# Patient Record
Sex: Female | Born: 1955
Health system: Southern US, Community
[De-identification: ages and names within clinical notes are randomized; demographics above are authoritative.]

## PROBLEM LIST (undated history)

## (undated) DIAGNOSIS — H548 Legal blindness, as defined in USA: Secondary | ICD-10-CM

## (undated) DIAGNOSIS — M170 Bilateral primary osteoarthritis of knee: Secondary | ICD-10-CM

## (undated) DIAGNOSIS — G2581 Restless legs syndrome: Secondary | ICD-10-CM

## (undated) DIAGNOSIS — F419 Anxiety disorder, unspecified: Secondary | ICD-10-CM

## (undated) DIAGNOSIS — F410 Panic disorder [episodic paroxysmal anxiety] without agoraphobia: Secondary | ICD-10-CM

## (undated) DIAGNOSIS — F40240 Claustrophobia: Secondary | ICD-10-CM

## (undated) DIAGNOSIS — Z915 Personal history of self-harm: Secondary | ICD-10-CM

## (undated) DIAGNOSIS — R748 Abnormal levels of other serum enzymes: Secondary | ICD-10-CM

## (undated) DIAGNOSIS — D649 Anemia, unspecified: Secondary | ICD-10-CM

## (undated) DIAGNOSIS — J3089 Other allergic rhinitis: Secondary | ICD-10-CM

## (undated) DIAGNOSIS — F1190 Opioid use, unspecified, uncomplicated: Secondary | ICD-10-CM

## (undated) DIAGNOSIS — Z9049 Acquired absence of other specified parts of digestive tract: Secondary | ICD-10-CM

## (undated) DIAGNOSIS — F329 Major depressive disorder, single episode, unspecified: Secondary | ICD-10-CM

## (undated) DIAGNOSIS — K449 Diaphragmatic hernia without obstruction or gangrene: Secondary | ICD-10-CM

## (undated) DIAGNOSIS — R32 Unspecified urinary incontinence: Secondary | ICD-10-CM

## (undated) DIAGNOSIS — R002 Palpitations: Secondary | ICD-10-CM

## (undated) DIAGNOSIS — N816 Rectocele: Secondary | ICD-10-CM

## (undated) DIAGNOSIS — B1910 Unspecified viral hepatitis B without hepatic coma: Secondary | ICD-10-CM

## (undated) DIAGNOSIS — E7800 Pure hypercholesterolemia, unspecified: Secondary | ICD-10-CM

## (undated) DIAGNOSIS — G609 Hereditary and idiopathic neuropathy, unspecified: Secondary | ICD-10-CM

## (undated) DIAGNOSIS — K602 Anal fissure, unspecified: Secondary | ICD-10-CM

## (undated) DIAGNOSIS — M810 Age-related osteoporosis without current pathological fracture: Secondary | ICD-10-CM

## (undated) DIAGNOSIS — F32A Depression, unspecified: Secondary | ICD-10-CM

## (undated) DIAGNOSIS — Z8711 Personal history of peptic ulcer disease: Secondary | ICD-10-CM

## (undated) DIAGNOSIS — R110 Nausea: Secondary | ICD-10-CM

## (undated) DIAGNOSIS — R441 Visual hallucinations: Secondary | ICD-10-CM

## (undated) DIAGNOSIS — K279 Peptic ulcer, site unspecified, unspecified as acute or chronic, without hemorrhage or perforation: Secondary | ICD-10-CM

## (undated) DIAGNOSIS — Z90710 Acquired absence of both cervix and uterus: Secondary | ICD-10-CM

## (undated) DIAGNOSIS — H2510 Age-related nuclear cataract, unspecified eye: Secondary | ICD-10-CM

## (undated) DIAGNOSIS — H5316 Psychophysical visual disturbances: Secondary | ICD-10-CM

## (undated) DIAGNOSIS — H3552 Pigmentary retinal dystrophy: Secondary | ICD-10-CM

## (undated) DIAGNOSIS — N941 Unspecified dyspareunia: Secondary | ICD-10-CM

## (undated) DIAGNOSIS — M7918 Myalgia, other site: Secondary | ICD-10-CM

## (undated) DIAGNOSIS — M1990 Unspecified osteoarthritis, unspecified site: Secondary | ICD-10-CM

## (undated) DIAGNOSIS — I10 Essential (primary) hypertension: Secondary | ICD-10-CM

## (undated) DIAGNOSIS — Z9889 Other specified postprocedural states: Secondary | ICD-10-CM

## (undated) DIAGNOSIS — K219 Gastro-esophageal reflux disease without esophagitis: Secondary | ICD-10-CM

## (undated) HISTORY — DX: Personal history of self-harm: Z91.5

## (undated) HISTORY — PX: COLONOSCOPY: SHX174

## (undated) HISTORY — DX: Depression, unspecified: F32.A

## (undated) HISTORY — DX: Legal blindness, as defined in USA: H54.8

## (undated) HISTORY — DX: Unspecified osteoarthritis, unspecified site: M19.90

## (undated) HISTORY — DX: Other specified postprocedural states: Z98.890

## (undated) HISTORY — DX: Peptic ulcer, site unspecified, unspecified as acute or chronic, without hemorrhage or perforation: K27.9

## (undated) HISTORY — DX: Major depressive disorder, single episode, unspecified: F32.9

## (undated) HISTORY — PX: CHOLECYSTECTOMY: SHX55

## (undated) HISTORY — PX: RECTOCELE REPAIR: SHX761

## (undated) HISTORY — DX: Diaphragmatic hernia without obstruction or gangrene: K44.9

## (undated) HISTORY — PX: HERNIA REPAIR: SHX51

## (undated) HISTORY — DX: Rectocele: N81.6

## (undated) HISTORY — DX: Essential (primary) hypertension: I10

## (undated) HISTORY — DX: Pure hypercholesterolemia, unspecified: E78.00

## (undated) HISTORY — DX: Acquired absence of both cervix and uterus: Z90.710

## (undated) HISTORY — PX: APPENDECTOMY: SHX54

## (undated) HISTORY — PX: ESOPHAGOGASTRODUODENOSCOPY: SHX1529

## (undated) HISTORY — DX: Personal history of peptic ulcer disease: Z87.11

## (undated) HISTORY — PX: OOPHORECTOMY: SHX86

## (undated) HISTORY — DX: Gastro-esophageal reflux disease without esophagitis: K21.9

## (undated) HISTORY — DX: Unspecified viral hepatitis B without hepatic coma: B19.10

## (undated) HISTORY — DX: Panic disorder (episodic paroxysmal anxiety): F41.0

## (undated) HISTORY — DX: Pigmentary retinal dystrophy: H35.52

## (undated) HISTORY — PX: ABDOMINAL HYSTERECTOMY: SHX81

## (undated) HISTORY — DX: Acquired absence of other specified parts of digestive tract: Z90.49

## (undated) HISTORY — DX: Anxiety disorder, unspecified: F41.9

---

## 1975-02-19 HISTORY — PX: BREAST BIOPSY: SHX20

## 2003-12-02 ENCOUNTER — Ambulatory Visit: Payer: Self-pay | Admitting: Gastroenterology

## 2005-06-04 ENCOUNTER — Ambulatory Visit: Payer: Self-pay | Admitting: Gastroenterology

## 2005-11-13 ENCOUNTER — Other Ambulatory Visit: Payer: Self-pay

## 2005-12-02 ENCOUNTER — Inpatient Hospital Stay: Payer: Self-pay | Admitting: Obstetrics and Gynecology

## 2006-03-18 ENCOUNTER — Other Ambulatory Visit: Payer: Self-pay

## 2006-03-18 ENCOUNTER — Ambulatory Visit: Payer: Self-pay | Admitting: Obstetrics and Gynecology

## 2006-03-24 ENCOUNTER — Observation Stay: Payer: Self-pay | Admitting: Obstetrics and Gynecology

## 2006-07-09 ENCOUNTER — Ambulatory Visit: Payer: Self-pay | Admitting: Obstetrics and Gynecology

## 2006-12-24 ENCOUNTER — Ambulatory Visit: Payer: Self-pay | Admitting: Pain Medicine

## 2007-01-13 ENCOUNTER — Ambulatory Visit: Payer: Self-pay | Admitting: Pain Medicine

## 2007-01-19 ENCOUNTER — Ambulatory Visit: Payer: Self-pay | Admitting: Pain Medicine

## 2007-01-27 ENCOUNTER — Ambulatory Visit: Payer: Self-pay | Admitting: Pain Medicine

## 2007-02-16 ENCOUNTER — Ambulatory Visit: Payer: Self-pay | Admitting: Physician Assistant

## 2007-03-23 ENCOUNTER — Ambulatory Visit: Payer: Self-pay | Admitting: Physician Assistant

## 2007-04-21 ENCOUNTER — Ambulatory Visit: Payer: Self-pay | Admitting: Physician Assistant

## 2007-06-30 ENCOUNTER — Ambulatory Visit: Payer: Self-pay | Admitting: Physician Assistant

## 2007-07-07 ENCOUNTER — Ambulatory Visit: Payer: Self-pay | Admitting: Pain Medicine

## 2007-07-13 ENCOUNTER — Ambulatory Visit: Payer: Self-pay | Admitting: Obstetrics and Gynecology

## 2007-07-22 ENCOUNTER — Ambulatory Visit: Payer: Self-pay | Admitting: Physician Assistant

## 2008-01-05 ENCOUNTER — Ambulatory Visit: Payer: Self-pay | Admitting: Physician Assistant

## 2008-03-01 ENCOUNTER — Ambulatory Visit: Payer: Self-pay | Admitting: Physician Assistant

## 2008-07-26 ENCOUNTER — Ambulatory Visit: Payer: Self-pay | Admitting: Obstetrics and Gynecology

## 2008-08-25 ENCOUNTER — Ambulatory Visit: Payer: Self-pay | Admitting: Physician Assistant

## 2008-10-05 ENCOUNTER — Ambulatory Visit: Payer: Self-pay | Admitting: Physician Assistant

## 2008-12-01 ENCOUNTER — Ambulatory Visit: Payer: Self-pay | Admitting: Physician Assistant

## 2008-12-13 ENCOUNTER — Ambulatory Visit: Payer: Self-pay | Admitting: Pain Medicine

## 2009-01-05 ENCOUNTER — Ambulatory Visit: Payer: Self-pay | Admitting: Physician Assistant

## 2009-02-06 ENCOUNTER — Ambulatory Visit: Payer: Self-pay | Admitting: Pain Medicine

## 2009-02-27 ENCOUNTER — Ambulatory Visit: Payer: Self-pay | Admitting: Pain Medicine

## 2009-03-21 ENCOUNTER — Ambulatory Visit: Payer: Self-pay | Admitting: Pain Medicine

## 2009-04-12 ENCOUNTER — Ambulatory Visit: Payer: Self-pay | Admitting: Pain Medicine

## 2009-05-02 ENCOUNTER — Ambulatory Visit: Payer: Self-pay | Admitting: Pain Medicine

## 2009-07-27 ENCOUNTER — Ambulatory Visit: Payer: Self-pay | Admitting: Obstetrics and Gynecology

## 2010-01-03 ENCOUNTER — Ambulatory Visit: Payer: Self-pay | Admitting: Pain Medicine

## 2010-02-22 ENCOUNTER — Ambulatory Visit: Payer: Self-pay | Admitting: Pain Medicine

## 2010-03-13 ENCOUNTER — Ambulatory Visit: Payer: Self-pay | Admitting: Pain Medicine

## 2010-03-26 ENCOUNTER — Ambulatory Visit: Payer: Self-pay | Admitting: Pain Medicine

## 2010-05-29 ENCOUNTER — Ambulatory Visit: Payer: Self-pay | Admitting: Internal Medicine

## 2010-06-25 ENCOUNTER — Ambulatory Visit: Payer: Self-pay | Admitting: Pain Medicine

## 2010-07-24 ENCOUNTER — Emergency Department: Payer: Self-pay | Admitting: Emergency Medicine

## 2010-07-30 ENCOUNTER — Ambulatory Visit: Payer: Self-pay | Admitting: Obstetrics and Gynecology

## 2011-02-19 DIAGNOSIS — Z9151 Personal history of suicidal behavior: Secondary | ICD-10-CM

## 2011-02-19 HISTORY — DX: Personal history of suicidal behavior: Z91.51

## 2013-07-30 DIAGNOSIS — M1990 Unspecified osteoarthritis, unspecified site: Secondary | ICD-10-CM

## 2013-07-30 HISTORY — DX: Unspecified osteoarthritis, unspecified site: M19.90

## 2013-09-08 ENCOUNTER — Emergency Department: Payer: Self-pay | Admitting: Emergency Medicine

## 2013-09-08 ENCOUNTER — Ambulatory Visit: Payer: Self-pay | Admitting: Gastroenterology

## 2013-09-08 LAB — COMPREHENSIVE METABOLIC PANEL
ALK PHOS: 209 U/L — AB
AST: 94 U/L — AB (ref 15–37)
Albumin: 3.3 g/dL — ABNORMAL LOW (ref 3.4–5.0)
Anion Gap: 7 (ref 7–16)
BUN: 12 mg/dL (ref 7–18)
Bilirubin,Total: 0.3 mg/dL (ref 0.2–1.0)
CALCIUM: 8.9 mg/dL (ref 8.5–10.1)
CO2: 28 mmol/L (ref 21–32)
Chloride: 102 mmol/L (ref 98–107)
Creatinine: 0.87 mg/dL (ref 0.60–1.30)
EGFR (African American): >60
EGFR (Non-African Amer.): >60
Glucose: 84 mg/dL (ref 65–99)
Osmolality: 273 (ref 275–301)
POTASSIUM: 3.4 mmol/L — AB (ref 3.5–5.1)
SGPT (ALT): 106 U/L — ABNORMAL HIGH
Sodium: 137 mmol/L (ref 136–145)
Total Protein: 8 g/dL (ref 6.4–8.2)

## 2013-09-08 LAB — CBC
HCT: 34.3 % — ABNORMAL LOW (ref 35.0–47.0)
HGB: 10.8 g/dL — ABNORMAL LOW (ref 12.0–16.0)
MCH: 26 pg (ref 26.0–34.0)
MCHC: 31.5 g/dL — AB (ref 32.0–36.0)
MCV: 82 fL (ref 80–100)
Platelet: 386 10*3/uL (ref 150–440)
RBC: 4.16 10*6/uL (ref 3.80–5.20)
RDW: 16.5 % — AB (ref 11.5–14.5)
WBC: 9.2 10*3/uL (ref 3.6–11.0)

## 2013-09-08 LAB — TROPONIN I: Troponin-I: <0.02 ng/mL

## 2013-09-08 LAB — CK TOTAL AND CKMB (NOT AT ARMC)
CK, Total: 55 U/L
CK-MB: <0.5 ng/mL — ABNORMAL LOW (ref 0.5–3.6)

## 2013-09-09 ENCOUNTER — Ambulatory Visit: Payer: Self-pay | Admitting: Gastroenterology

## 2013-11-22 ENCOUNTER — Ambulatory Visit: Payer: Self-pay | Admitting: Pain Medicine

## 2013-11-22 ENCOUNTER — Emergency Department: Payer: Self-pay | Admitting: Emergency Medicine

## 2013-12-02 ENCOUNTER — Ambulatory Visit: Payer: Self-pay | Admitting: Pain Medicine

## 2013-12-15 ENCOUNTER — Ambulatory Visit: Payer: Self-pay | Admitting: Pain Medicine

## 2013-12-21 ENCOUNTER — Ambulatory Visit: Payer: Self-pay | Admitting: Pain Medicine

## 2014-01-03 ENCOUNTER — Ambulatory Visit: Payer: Self-pay | Admitting: Pain Medicine

## 2014-06-30 DIAGNOSIS — G4700 Insomnia, unspecified: Secondary | ICD-10-CM | POA: Insufficient documentation

## 2014-06-30 DIAGNOSIS — R739 Hyperglycemia, unspecified: Secondary | ICD-10-CM | POA: Insufficient documentation

## 2014-06-30 DIAGNOSIS — K644 Residual hemorrhoidal skin tags: Secondary | ICD-10-CM | POA: Insufficient documentation

## 2014-06-30 DIAGNOSIS — F32A Depression, unspecified: Secondary | ICD-10-CM

## 2014-06-30 DIAGNOSIS — E782 Mixed hyperlipidemia: Secondary | ICD-10-CM | POA: Insufficient documentation

## 2014-06-30 DIAGNOSIS — I10 Essential (primary) hypertension: Secondary | ICD-10-CM | POA: Insufficient documentation

## 2014-06-30 HISTORY — DX: Depression, unspecified: F32.A

## 2014-08-01 DIAGNOSIS — F419 Anxiety disorder, unspecified: Secondary | ICD-10-CM | POA: Insufficient documentation

## 2014-08-01 HISTORY — DX: Anxiety disorder, unspecified: F41.9

## 2014-11-02 DIAGNOSIS — T464X5A Adverse effect of angiotensin-converting-enzyme inhibitors, initial encounter: Secondary | ICD-10-CM | POA: Insufficient documentation

## 2014-12-28 ENCOUNTER — Ambulatory Visit: Payer: Self-pay | Admitting: Pain Medicine

## 2015-01-02 ENCOUNTER — Telehealth: Payer: Self-pay | Admitting: Pain Medicine

## 2015-01-02 NOTE — Telephone Encounter (Signed)
Patient states she has fallen and has been having to use more of her Tramadol.  She didn't feel like coming in for her last appointment.  Spoke with Dr Dossie Arbour and he wants to schedule patient tommorow.  Patient states she will try to come in tomorrow if she can find a ride.

## 2015-01-02 NOTE — Telephone Encounter (Signed)
Dr. Naveira, please advise. 

## 2015-01-02 NOTE — Telephone Encounter (Signed)
Out of meds / too sick to come in / can something be called in for withdrawals until she can get well enough to come in for appt

## 2015-01-02 NOTE — Telephone Encounter (Signed)
Please call and assess the patient's need. Be as specific as possible. Come back and talk to me once all the details are known. If what they need is a block, find out the location of the pain, laterality, pain referral, and their sedation/no sedation preference. 

## 2015-01-05 ENCOUNTER — Encounter: Payer: Self-pay | Admitting: Pain Medicine

## 2015-01-05 ENCOUNTER — Ambulatory Visit: Payer: PPO | Attending: Pain Medicine | Admitting: Pain Medicine

## 2015-01-05 ENCOUNTER — Other Ambulatory Visit: Payer: Self-pay | Admitting: Pain Medicine

## 2015-01-05 VITALS — BP 149/86 | HR 110 | Temp 98.5°F | Resp 16 | Ht 59.0 in | Wt 129.0 lb

## 2015-01-05 DIAGNOSIS — E785 Hyperlipidemia, unspecified: Secondary | ICD-10-CM

## 2015-01-05 DIAGNOSIS — I10 Essential (primary) hypertension: Secondary | ICD-10-CM | POA: Insufficient documentation

## 2015-01-05 DIAGNOSIS — K449 Diaphragmatic hernia without obstruction or gangrene: Secondary | ICD-10-CM

## 2015-01-05 DIAGNOSIS — F1190 Opioid use, unspecified, uncomplicated: Secondary | ICD-10-CM

## 2015-01-05 DIAGNOSIS — Z9189 Other specified personal risk factors, not elsewhere classified: Secondary | ICD-10-CM

## 2015-01-05 DIAGNOSIS — M25562 Pain in left knee: Secondary | ICD-10-CM | POA: Diagnosis not present

## 2015-01-05 DIAGNOSIS — M797 Fibromyalgia: Secondary | ICD-10-CM

## 2015-01-05 DIAGNOSIS — G8929 Other chronic pain: Secondary | ICD-10-CM | POA: Insufficient documentation

## 2015-01-05 DIAGNOSIS — F99 Mental disorder, not otherwise specified: Secondary | ICD-10-CM

## 2015-01-05 DIAGNOSIS — Z87891 Personal history of nicotine dependence: Secondary | ICD-10-CM

## 2015-01-05 DIAGNOSIS — T7411XA Adult physical abuse, confirmed, initial encounter: Secondary | ICD-10-CM | POA: Insufficient documentation

## 2015-01-05 DIAGNOSIS — Z79891 Long term (current) use of opiate analgesic: Secondary | ICD-10-CM | POA: Diagnosis not present

## 2015-01-05 DIAGNOSIS — Z90710 Acquired absence of both cervix and uterus: Secondary | ICD-10-CM

## 2015-01-05 DIAGNOSIS — N949 Unspecified condition associated with female genital organs and menstrual cycle: Secondary | ICD-10-CM

## 2015-01-05 DIAGNOSIS — M79605 Pain in left leg: Secondary | ICD-10-CM

## 2015-01-05 DIAGNOSIS — H3552 Pigmentary retinal dystrophy: Secondary | ICD-10-CM

## 2015-01-05 DIAGNOSIS — M5441 Lumbago with sciatica, right side: Secondary | ICD-10-CM

## 2015-01-05 DIAGNOSIS — M5416 Radiculopathy, lumbar region: Secondary | ICD-10-CM

## 2015-01-05 DIAGNOSIS — Z8711 Personal history of peptic ulcer disease: Secondary | ICD-10-CM

## 2015-01-05 DIAGNOSIS — M542 Cervicalgia: Secondary | ICD-10-CM | POA: Diagnosis present

## 2015-01-05 DIAGNOSIS — F411 Generalized anxiety disorder: Secondary | ICD-10-CM

## 2015-01-05 DIAGNOSIS — H539 Unspecified visual disturbance: Secondary | ICD-10-CM

## 2015-01-05 DIAGNOSIS — T7411XS Adult physical abuse, confirmed, sequela: Secondary | ICD-10-CM

## 2015-01-05 DIAGNOSIS — M792 Neuralgia and neuritis, unspecified: Secondary | ICD-10-CM

## 2015-01-05 DIAGNOSIS — M79604 Pain in right leg: Secondary | ICD-10-CM

## 2015-01-05 DIAGNOSIS — M5136 Other intervertebral disc degeneration, lumbar region with discogenic back pain only: Secondary | ICD-10-CM | POA: Insufficient documentation

## 2015-01-05 DIAGNOSIS — M25561 Pain in right knee: Secondary | ICD-10-CM | POA: Diagnosis not present

## 2015-01-05 DIAGNOSIS — Z79899 Other long term (current) drug therapy: Secondary | ICD-10-CM

## 2015-01-05 DIAGNOSIS — Z9049 Acquired absence of other specified parts of digestive tract: Secondary | ICD-10-CM

## 2015-01-05 DIAGNOSIS — R102 Pelvic and perineal pain: Secondary | ICD-10-CM

## 2015-01-05 DIAGNOSIS — M170 Bilateral primary osteoarthritis of knee: Secondary | ICD-10-CM | POA: Diagnosis not present

## 2015-01-05 DIAGNOSIS — M791 Myalgia: Secondary | ICD-10-CM

## 2015-01-05 DIAGNOSIS — F32A Depression, unspecified: Secondary | ICD-10-CM

## 2015-01-05 DIAGNOSIS — M47896 Other spondylosis, lumbar region: Secondary | ICD-10-CM | POA: Diagnosis not present

## 2015-01-05 DIAGNOSIS — M47816 Spondylosis without myelopathy or radiculopathy, lumbar region: Secondary | ICD-10-CM

## 2015-01-05 DIAGNOSIS — F329 Major depressive disorder, single episode, unspecified: Secondary | ICD-10-CM | POA: Diagnosis not present

## 2015-01-05 DIAGNOSIS — M79606 Pain in leg, unspecified: Secondary | ICD-10-CM

## 2015-01-05 DIAGNOSIS — M549 Dorsalgia, unspecified: Secondary | ICD-10-CM | POA: Diagnosis present

## 2015-01-05 DIAGNOSIS — Z9889 Other specified postprocedural states: Secondary | ICD-10-CM

## 2015-01-05 DIAGNOSIS — M4726 Other spondylosis with radiculopathy, lumbar region: Secondary | ICD-10-CM

## 2015-01-05 DIAGNOSIS — Z9151 Personal history of suicidal behavior: Secondary | ICD-10-CM

## 2015-01-05 DIAGNOSIS — M7918 Myalgia, other site: Secondary | ICD-10-CM

## 2015-01-05 DIAGNOSIS — M545 Low back pain: Secondary | ICD-10-CM | POA: Diagnosis not present

## 2015-01-05 DIAGNOSIS — K219 Gastro-esophageal reflux disease without esophagitis: Secondary | ICD-10-CM | POA: Diagnosis not present

## 2015-01-05 DIAGNOSIS — H548 Legal blindness, as defined in USA: Secondary | ICD-10-CM | POA: Diagnosis not present

## 2015-01-05 DIAGNOSIS — Z7189 Other specified counseling: Secondary | ICD-10-CM

## 2015-01-05 DIAGNOSIS — R441 Visual hallucinations: Secondary | ICD-10-CM | POA: Diagnosis not present

## 2015-01-05 DIAGNOSIS — Z5181 Encounter for therapeutic drug level monitoring: Secondary | ICD-10-CM

## 2015-01-05 DIAGNOSIS — Z8659 Personal history of other mental and behavioral disorders: Secondary | ICD-10-CM

## 2015-01-05 DIAGNOSIS — N816 Rectocele: Secondary | ICD-10-CM

## 2015-01-05 DIAGNOSIS — M6249 Contracture of muscle, multiple sites: Secondary | ICD-10-CM

## 2015-01-05 DIAGNOSIS — M5442 Lumbago with sciatica, left side: Secondary | ICD-10-CM

## 2015-01-05 DIAGNOSIS — Z915 Personal history of self-harm: Secondary | ICD-10-CM | POA: Insufficient documentation

## 2015-01-05 DIAGNOSIS — M62838 Other muscle spasm: Secondary | ICD-10-CM

## 2015-01-05 DIAGNOSIS — F1120 Opioid dependence, uncomplicated: Secondary | ICD-10-CM

## 2015-01-05 HISTORY — DX: Personal history of peptic ulcer disease: Z87.11

## 2015-01-05 HISTORY — DX: Rectocele: N81.6

## 2015-01-05 HISTORY — DX: Other specified postprocedural states: Z98.890

## 2015-01-05 HISTORY — DX: Acquired absence of both cervix and uterus: Z90.710

## 2015-01-05 HISTORY — DX: Acquired absence of other specified parts of digestive tract: Z90.49

## 2015-01-05 MED ORDER — TRAMADOL HCL 50 MG PO TABS: 100.0000 mg | ORAL_TABLET | Freq: Four times a day (QID) | ORAL | Status: DC | PRN

## 2015-01-05 NOTE — Progress Notes (Signed)
Tramadol pill count #0

## 2015-01-05 NOTE — Progress Notes (Signed)
Safety precautions to be maintained throughout the outpatient stay will include: orient to surroundings, keep bed in low position, maintain call bell within reach at all times, provide assistance with transfer out of bed and ambulation.  
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
## 2015-01-06 ENCOUNTER — Telehealth: Payer: Self-pay | Admitting: Pain Medicine

## 2015-01-06 NOTE — Telephone Encounter (Signed)
She picked up last prescription on Oct 26. The will fill it 2 days before the 30 day period. Therefore she cannot fill it until  Oct. 23. Attempted to call patient, no answer.

## 2015-01-06 NOTE — Telephone Encounter (Signed)
Pharmacy will not fill meds until 11-23 w/o call from nurse or dr Dossie Arbour please call

## 2015-01-10 NOTE — Telephone Encounter (Signed)
Attempted to call patient, no answer 

## 2015-01-13 LAB — TOXASSURE SELECT 13 (MW), URINE: PDF: 0

## 2015-01-16 NOTE — Telephone Encounter (Signed)
Spoke with Sandra [name], explained why she could not fill it until Nov. 23.

## 2015-01-27 ENCOUNTER — Other Ambulatory Visit: Payer: Self-pay | Admitting: Pain Medicine

## 2015-01-30 ENCOUNTER — Encounter: Payer: Self-pay | Admitting: Pain Medicine

## 2015-02-01 ENCOUNTER — Encounter: Payer: Self-pay | Admitting: Pain Medicine

## 2015-02-07 ENCOUNTER — Other Ambulatory Visit
Admission: RE | Admit: 2015-02-07 | Discharge: 2015-02-07 | Disposition: A | Discharge status: Home / Self Care | Payer: PPO | Source: Ambulatory Visit | Attending: Pain Medicine | Admitting: Pain Medicine

## 2015-02-07 DIAGNOSIS — Z029 Encounter for administrative examinations, unspecified: Secondary | ICD-10-CM | POA: Diagnosis present

## 2015-02-07 LAB — COMPREHENSIVE METABOLIC PANEL
ALT: 25 U/L (ref 14–54)
AST: 35 U/L (ref 15–41)
Albumin: 4.5 g/dL (ref 3.5–5.0)
Alkaline Phosphatase: 88 U/L (ref 38–126)
Anion gap: 10 (ref 5–15)
BILIRUBIN TOTAL: 0.5 mg/dL (ref 0.3–1.2)
BUN: 14 mg/dL (ref 6–20)
CHLORIDE: 104 mmol/L (ref 101–111)
CO2: 26 mmol/L (ref 22–32)
CREATININE: 0.84 mg/dL (ref 0.44–1.00)
Calcium: 9.3 mg/dL (ref 8.9–10.3)
Glucose, Bld: 122 mg/dL — ABNORMAL HIGH (ref 65–99)
Potassium: 3.5 mmol/L (ref 3.5–5.1)
Sodium: 140 mmol/L (ref 135–145)
TOTAL PROTEIN: 7.7 g/dL (ref 6.5–8.1)

## 2015-02-07 LAB — C-REACTIVE PROTEIN

## 2015-02-07 LAB — MAGNESIUM: MAGNESIUM: 2 mg/dL (ref 1.7–2.4)

## 2015-02-07 LAB — SEDIMENTATION RATE: Sed Rate: 26 mm/hr (ref 0–30)

## 2015-02-09 ENCOUNTER — Encounter: Payer: Self-pay | Admitting: Pain Medicine

## 2015-02-09 ENCOUNTER — Ambulatory Visit: Payer: PPO | Attending: Pain Medicine | Admitting: Pain Medicine

## 2015-02-09 VITALS — BP 134/62 | HR 94 | Temp 98.1°F | Resp 16 | Ht 59.0 in | Wt 127.0 lb

## 2015-02-09 DIAGNOSIS — H548 Legal blindness, as defined in USA: Secondary | ICD-10-CM | POA: Insufficient documentation

## 2015-02-09 DIAGNOSIS — Z87891 Personal history of nicotine dependence: Secondary | ICD-10-CM | POA: Diagnosis not present

## 2015-02-09 DIAGNOSIS — K219 Gastro-esophageal reflux disease without esophagitis: Secondary | ICD-10-CM | POA: Insufficient documentation

## 2015-02-09 DIAGNOSIS — F99 Mental disorder, not otherwise specified: Secondary | ICD-10-CM | POA: Diagnosis not present

## 2015-02-09 DIAGNOSIS — F1190 Opioid use, unspecified, uncomplicated: Secondary | ICD-10-CM | POA: Diagnosis not present

## 2015-02-09 DIAGNOSIS — F40240 Claustrophobia: Secondary | ICD-10-CM | POA: Diagnosis not present

## 2015-02-09 DIAGNOSIS — Z9049 Acquired absence of other specified parts of digestive tract: Secondary | ICD-10-CM | POA: Diagnosis not present

## 2015-02-09 DIAGNOSIS — R441 Visual hallucinations: Secondary | ICD-10-CM | POA: Diagnosis not present

## 2015-02-09 DIAGNOSIS — I10 Essential (primary) hypertension: Secondary | ICD-10-CM | POA: Insufficient documentation

## 2015-02-09 DIAGNOSIS — M170 Bilateral primary osteoarthritis of knee: Secondary | ICD-10-CM | POA: Insufficient documentation

## 2015-02-09 DIAGNOSIS — E7801 Familial hypercholesterolemia: Secondary | ICD-10-CM | POA: Diagnosis not present

## 2015-02-09 DIAGNOSIS — F410 Panic disorder [episodic paroxysmal anxiety] without agoraphobia: Secondary | ICD-10-CM | POA: Diagnosis not present

## 2015-02-09 DIAGNOSIS — M79606 Pain in leg, unspecified: Secondary | ICD-10-CM | POA: Diagnosis not present

## 2015-02-09 DIAGNOSIS — G8929 Other chronic pain: Secondary | ICD-10-CM | POA: Insufficient documentation

## 2015-02-09 DIAGNOSIS — R109 Unspecified abdominal pain: Secondary | ICD-10-CM | POA: Insufficient documentation

## 2015-02-09 DIAGNOSIS — M549 Dorsalgia, unspecified: Secondary | ICD-10-CM | POA: Insufficient documentation

## 2015-02-09 DIAGNOSIS — Z9189 Other specified personal risk factors, not elsewhere classified: Secondary | ICD-10-CM | POA: Diagnosis not present

## 2015-02-09 DIAGNOSIS — Z915 Personal history of self-harm: Secondary | ICD-10-CM

## 2015-02-09 DIAGNOSIS — F329 Major depressive disorder, single episode, unspecified: Secondary | ICD-10-CM | POA: Insufficient documentation

## 2015-02-09 DIAGNOSIS — F411 Generalized anxiety disorder: Secondary | ICD-10-CM

## 2015-02-09 DIAGNOSIS — Z9151 Personal history of suicidal behavior: Secondary | ICD-10-CM

## 2015-02-09 DIAGNOSIS — Z8659 Personal history of other mental and behavioral disorders: Secondary | ICD-10-CM

## 2015-02-09 MED ORDER — DIAZEPAM 5 MG PO TABS: ORAL_TABLET | ORAL | Status: DC

## 2015-02-09 MED ORDER — TRAMADOL HCL 50 MG PO TABS: 100.0000 mg | ORAL_TABLET | Freq: Four times a day (QID) | ORAL | Status: DC | PRN
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
## 2015-02-09 NOTE — Patient Instructions (Signed)
Take valium as prescribed prior to MRI.  Bring a driver.  Verbally instructed patient with patient stating understanding.

## 2015-02-09 NOTE — Progress Notes (Signed)
Safety precautions to be maintained throughout the outpatient stay will include: orient to surroundings, keep bed in low position, maintain call bell within reach at all times, provide assistance with transfer out of bed and ambulation.  Tramadol tablets are 30 count.

## 2015-02-10 LAB — 25-HYDROXY VITAMIN D LCMS D2+D3: 25-Hydroxy, Vitamin D: 19 ng/mL — ABNORMAL LOW

## 2015-02-10 LAB — 25-HYDROXYVITAMIN D LCMS D2+D3
25-HYDROXY, VITAMIN D-2: 3.9 ng/mL
25-HYDROXY, VITAMIN D-3: 15 ng/mL

## 2015-02-16 NOTE — Progress Notes (Signed)

## 2015-03-09 ENCOUNTER — Ambulatory Visit: Payer: PPO | Attending: Pain Medicine | Admitting: Pain Medicine

## 2015-03-09 ENCOUNTER — Encounter: Payer: Self-pay | Admitting: Pain Medicine

## 2015-03-09 ENCOUNTER — Other Ambulatory Visit: Payer: Self-pay | Admitting: Pain Medicine

## 2015-03-09 VITALS — BP 127/49 | HR 77 | Temp 98.9°F | Resp 18 | Ht <= 58 in | Wt 129.0 lb

## 2015-03-09 DIAGNOSIS — R441 Visual hallucinations: Secondary | ICD-10-CM | POA: Diagnosis not present

## 2015-03-09 DIAGNOSIS — F40240 Claustrophobia: Secondary | ICD-10-CM | POA: Insufficient documentation

## 2015-03-09 DIAGNOSIS — F410 Panic disorder [episodic paroxysmal anxiety] without agoraphobia: Secondary | ICD-10-CM | POA: Insufficient documentation

## 2015-03-09 DIAGNOSIS — H3552 Pigmentary retinal dystrophy: Secondary | ICD-10-CM | POA: Insufficient documentation

## 2015-03-09 DIAGNOSIS — I10 Essential (primary) hypertension: Secondary | ICD-10-CM | POA: Insufficient documentation

## 2015-03-09 DIAGNOSIS — F1120 Opioid dependence, uncomplicated: Secondary | ICD-10-CM | POA: Diagnosis not present

## 2015-03-09 DIAGNOSIS — M170 Bilateral primary osteoarthritis of knee: Secondary | ICD-10-CM | POA: Insufficient documentation

## 2015-03-09 DIAGNOSIS — Z79891 Long term (current) use of opiate analgesic: Secondary | ICD-10-CM | POA: Diagnosis not present

## 2015-03-09 DIAGNOSIS — F1190 Opioid use, unspecified, uncomplicated: Secondary | ICD-10-CM | POA: Diagnosis not present

## 2015-03-09 DIAGNOSIS — Z90710 Acquired absence of both cervix and uterus: Secondary | ICD-10-CM | POA: Diagnosis not present

## 2015-03-09 DIAGNOSIS — F329 Major depressive disorder, single episode, unspecified: Secondary | ICD-10-CM | POA: Diagnosis not present

## 2015-03-09 DIAGNOSIS — K219 Gastro-esophageal reflux disease without esophagitis: Secondary | ICD-10-CM | POA: Insufficient documentation

## 2015-03-09 DIAGNOSIS — Z9049 Acquired absence of other specified parts of digestive tract: Secondary | ICD-10-CM | POA: Insufficient documentation

## 2015-03-09 DIAGNOSIS — M5412 Radiculopathy, cervical region: Secondary | ICD-10-CM | POA: Diagnosis not present

## 2015-03-09 DIAGNOSIS — M47812 Spondylosis without myelopathy or radiculopathy, cervical region: Secondary | ICD-10-CM | POA: Insufficient documentation

## 2015-03-09 DIAGNOSIS — M542 Cervicalgia: Secondary | ICD-10-CM | POA: Diagnosis not present

## 2015-03-09 DIAGNOSIS — K449 Diaphragmatic hernia without obstruction or gangrene: Secondary | ICD-10-CM | POA: Insufficient documentation

## 2015-03-09 DIAGNOSIS — M792 Neuralgia and neuritis, unspecified: Secondary | ICD-10-CM

## 2015-03-09 DIAGNOSIS — E7801 Familial hypercholesterolemia: Secondary | ICD-10-CM | POA: Insufficient documentation

## 2015-03-09 DIAGNOSIS — E7800 Pure hypercholesterolemia, unspecified: Secondary | ICD-10-CM | POA: Diagnosis not present

## 2015-03-09 DIAGNOSIS — H548 Legal blindness, as defined in USA: Secondary | ICD-10-CM | POA: Insufficient documentation

## 2015-03-09 DIAGNOSIS — K279 Peptic ulcer, site unspecified, unspecified as acute or chronic, without hemorrhage or perforation: Secondary | ICD-10-CM | POA: Insufficient documentation

## 2015-03-09 DIAGNOSIS — G8929 Other chronic pain: Secondary | ICD-10-CM | POA: Diagnosis not present

## 2015-03-09 DIAGNOSIS — M25569 Pain in unspecified knee: Secondary | ICD-10-CM | POA: Diagnosis not present

## 2015-03-09 DIAGNOSIS — Z87891 Personal history of nicotine dependence: Secondary | ICD-10-CM | POA: Diagnosis not present

## 2015-03-09 DIAGNOSIS — M79643 Pain in unspecified hand: Secondary | ICD-10-CM | POA: Diagnosis not present

## 2015-03-09 DIAGNOSIS — Z5181 Encounter for therapeutic drug level monitoring: Secondary | ICD-10-CM | POA: Diagnosis not present

## 2015-03-09 MED ORDER — TRAMADOL HCL 50 MG PO TABS: 100.0000 mg | ORAL_TABLET | Freq: Four times a day (QID) | ORAL | Status: DC | PRN

## 2015-03-09 MED ORDER — GABAPENTIN 400 MG PO CAPS: 400.0000 mg | ORAL_CAPSULE | Freq: Four times a day (QID) | ORAL | Status: DC

## 2015-03-09 NOTE — Patient Instructions (Signed)
Epidural Steroid Injection Patient Information  Description: The epidural space surrounds the nerves as they exit the spinal cord.  In some patients, the nerves can be compressed and inflamed by a bulging disc or a tight spinal canal (spinal stenosis).  By injecting steroids into the epidural space, we can bring irritated nerves into direct contact with a potentially helpful medication.  These steroids act directly on the irritated nerves and can reduce swelling and inflammation which often leads to decreased pain.  Epidural steroids may be injected anywhere along the spine and from the neck to the low back depending upon the location of your pain.   After numbing the skin with local anesthetic (like Novocaine), a small needle is passed into the epidural space slowly.  You may experience a sensation of pressure while this is being done.  The entire block usually last less than 10 minutes.  Conditions which may be treated by epidural steroids:   Low back and leg pain  Neck and arm pain  Spinal stenosis  Post-laminectomy syndrome  Herpes zoster (shingles) pain  Pain from compression fractures  Preparation for the injection:  1. Do not eat any solid food or dairy products within 6 hours of your appointment.  2. You may drink clear liquids up to 2 hours before appointment.  Clear liquids include water, black coffee, juice or soda.  No milk or cream please. 3. You may take your regular medication, including pain medications, with a sip of water before your appointment  Diabetics should hold regular insulin (if taken separately) and take 1/2 normal NPH dos the morning of the procedure.  Carry some sugar containing items with you to your appointment. 4. A driver must accompany you and be prepared to drive you home after your procedure.  5. Bring all your current medications with your. 6. An IV may be inserted and sedation may be given at the discretion of the physician.   7. A blood pressure  cuff, EKG and other monitors will often be applied during the procedure.  Some patients may need to have extra oxygen administered for a short period. 8. You will be asked to provide medical information, including your allergies, prior to the procedure.  We must know immediately if you are taking blood thinners (like Coumadin/Warfarin)  Or if you are allergic to IV iodine contrast (dye). We must know if you could possible be pregnant.  Possible side-effects:  Bleeding from needle site  Infection (rare, may require surgery)  Nerve injury (rare)  Numbness & tingling (temporary)  Difficulty urinating (rare, temporary)  Spinal headache ( a headache worse with upright posture)  Light -headedness (temporary)  Pain at injection site (several days)  Decreased blood pressure (temporary)  Weakness in arm/leg (temporary)  Pressure sensation in back/neck (temporary)  Call if you experience:  Fever/chills associated with headache or increased back/neck pain.  Headache worsened by an upright position.  New onset weakness or numbness of an extremity below the injection site  Hives or difficulty breathing (go to the emergency room)  Inflammation or drainage at the infection site  Severe back/neck pain  Any new symptoms which are concerning to you  Please note:  Although the local anesthetic injected can often make your back or neck feel good for several hours after the injection, the pain will likely return.  It takes 3-7 days for steroids to work in the epidural space.  You may not notice any pain relief for at least that one week.    If effective, we will often do a series of three injections spaced 3-6 weeks apart to maximally decrease your pain.  After the initial series, we generally will wait several months before considering a repeat injection of the same type.  If you have any questions, please call [phone] [facility] 

## 2015-03-09 NOTE — Assessment & Plan Note (Signed)
The patient continues to have visual hallucinations involving people, including her dead husband. She is pending to have an MRI of the brain done on 03/10/2015, followed by a neurology evaluation. The patient is also scheduled to have a psychiatric evaluation for her chronic depression. We will have the psychiatrist also evaluated her for the possibility of these hallucinations being nonorganic.

## 2015-03-09 NOTE — Progress Notes (Signed)
#  69 1/2 remaining Tramadol 50 mg
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
## 2015-03-09 NOTE — Progress Notes (Signed)
Safety precautions to be maintained throughout the outpatient stay will include: orient to surroundings, keep bed in low position, maintain call bell within reach at all times, provide assistance with transfer out of bed and ambulation.  

## 2015-03-10 ENCOUNTER — Ambulatory Visit
Admission: RE | Admit: 2015-03-10 | Discharge: 2015-03-10 | Disposition: A | Discharge status: Home / Self Care | Payer: PPO | Source: Ambulatory Visit | Attending: Pain Medicine | Admitting: Pain Medicine

## 2015-03-10 ENCOUNTER — Other Ambulatory Visit: Payer: Self-pay | Admitting: Pain Medicine

## 2015-03-10 DIAGNOSIS — I6782 Cerebral ischemia: Secondary | ICD-10-CM | POA: Diagnosis not present

## 2015-03-10 DIAGNOSIS — R441 Visual hallucinations: Secondary | ICD-10-CM

## 2015-03-10 DIAGNOSIS — R600 Localized edema: Secondary | ICD-10-CM | POA: Diagnosis not present

## 2015-03-10 DIAGNOSIS — R442 Other hallucinations: Secondary | ICD-10-CM | POA: Diagnosis not present

## 2015-03-10 MED ORDER — GADOBENATE DIMEGLUMINE 529 MG/ML IV SOLN
15.0000 mL | Freq: Once | INTRAVENOUS | Status: AC | PRN
  Administered 2015-03-10: 11 mL via INTRAVENOUS

## 2015-03-16 LAB — TOXASSURE SELECT 13 (MW), URINE: PDF: 0

## 2015-03-21 ENCOUNTER — Telehealth: Payer: Self-pay | Admitting: Pain Medicine

## 2015-03-21 NOTE — Telephone Encounter (Signed)
Patient having severe muscle spasms can dr Dossie Arbour call her something if for it ? Please let patient know [phone]

## 2015-03-21 NOTE — Telephone Encounter (Signed)
Patient returned call and call placed up front to secretaries to make an appointment with Dr. Dossie Arbour.

## 2015-03-21 NOTE — Telephone Encounter (Signed)
Attempted to call patient to tell her she must come for appt for any medications. No answer.

## 2015-03-28 ENCOUNTER — Ambulatory Visit: Payer: PPO | Admitting: Pain Medicine

## 2015-04-05 DIAGNOSIS — Z1159 Encounter for screening for other viral diseases: Secondary | ICD-10-CM | POA: Insufficient documentation

## 2015-04-12 ENCOUNTER — Telehealth: Payer: Self-pay

## 2015-04-12 NOTE — Telephone Encounter (Signed)
Pt went to the dentist wants Dr. Dossie Arbour to know dentist prescribed her Vicodin. She is to only take these pills at night.

## 2015-04-12 NOTE — Telephone Encounter (Signed)
See note below

## 2015-04-13 DIAGNOSIS — Z1159 Encounter for screening for other viral diseases: Secondary | ICD-10-CM | POA: Diagnosis not present

## 2015-04-13 DIAGNOSIS — R739 Hyperglycemia, unspecified: Secondary | ICD-10-CM | POA: Diagnosis not present

## 2015-04-13 DIAGNOSIS — I10 Essential (primary) hypertension: Secondary | ICD-10-CM | POA: Diagnosis not present

## 2015-04-13 NOTE — Telephone Encounter (Signed)
Attempted to call patient regarding recieveing vicodin

## 2015-04-17 ENCOUNTER — Telehealth: Payer: Self-pay | Admitting: Pain Medicine

## 2015-04-17 NOTE — Telephone Encounter (Signed)
error 

## 2015-04-18 ENCOUNTER — Ambulatory Visit: Payer: PPO | Admitting: Pain Medicine

## 2015-04-25 DIAGNOSIS — I10 Essential (primary) hypertension: Secondary | ICD-10-CM | POA: Diagnosis not present

## 2015-04-25 DIAGNOSIS — F419 Anxiety disorder, unspecified: Secondary | ICD-10-CM | POA: Diagnosis not present

## 2015-04-25 DIAGNOSIS — L299 Pruritus, unspecified: Secondary | ICD-10-CM | POA: Diagnosis not present

## 2015-04-25 DIAGNOSIS — W57XXXA Bitten or stung by nonvenomous insect and other nonvenomous arthropods, initial encounter: Secondary | ICD-10-CM | POA: Diagnosis not present

## 2015-04-25 DIAGNOSIS — E782 Mixed hyperlipidemia: Secondary | ICD-10-CM | POA: Diagnosis not present

## 2015-04-26 DIAGNOSIS — R441 Visual hallucinations: Secondary | ICD-10-CM | POA: Diagnosis not present

## 2015-04-26 DIAGNOSIS — H5316 Psychophysical visual disturbances: Secondary | ICD-10-CM | POA: Diagnosis not present

## 2015-05-04 ENCOUNTER — Encounter: Payer: Self-pay | Admitting: Pain Medicine

## 2015-05-04 DIAGNOSIS — H5316 Psychophysical visual disturbances: Secondary | ICD-10-CM | POA: Insufficient documentation

## 2015-05-13 ENCOUNTER — Encounter: Payer: Self-pay | Admitting: Emergency Medicine

## 2015-05-13 ENCOUNTER — Emergency Department
Admission: EM | Admit: 2015-05-13 | Discharge: 2015-05-15 | Disposition: A | Discharge status: Other Acute Inpatient Hospital | Payer: PPO | Attending: Emergency Medicine | Admitting: Emergency Medicine

## 2015-05-13 DIAGNOSIS — L988 Other specified disorders of the skin and subcutaneous tissue: Secondary | ICD-10-CM | POA: Insufficient documentation

## 2015-05-13 DIAGNOSIS — F419 Anxiety disorder, unspecified: Secondary | ICD-10-CM | POA: Diagnosis not present

## 2015-05-13 DIAGNOSIS — Z87891 Personal history of nicotine dependence: Secondary | ICD-10-CM | POA: Insufficient documentation

## 2015-05-13 DIAGNOSIS — Z79899 Other long term (current) drug therapy: Secondary | ICD-10-CM | POA: Diagnosis not present

## 2015-05-13 DIAGNOSIS — R443 Hallucinations, unspecified: Secondary | ICD-10-CM | POA: Diagnosis not present

## 2015-05-13 DIAGNOSIS — F39 Unspecified mood [affective] disorder: Secondary | ICD-10-CM

## 2015-05-13 DIAGNOSIS — R441 Visual hallucinations: Secondary | ICD-10-CM | POA: Insufficient documentation

## 2015-05-13 DIAGNOSIS — I10 Essential (primary) hypertension: Secondary | ICD-10-CM | POA: Diagnosis not present

## 2015-05-13 DIAGNOSIS — H5316 Psychophysical visual disturbances: Secondary | ICD-10-CM

## 2015-05-13 LAB — URINALYSIS COMPLETE WITH MICROSCOPIC (ARMC ONLY)
BILIRUBIN URINE: NEGATIVE
Bacteria, UA: NONE SEEN
GLUCOSE, UA: NEGATIVE mg/dL
KETONES UR: NEGATIVE mg/dL
LEUKOCYTES UA: NEGATIVE
NITRITE: NEGATIVE
PH: 7 (ref 5.0–8.0)
Protein, ur: NEGATIVE mg/dL
SPECIFIC GRAVITY, URINE: 1.012 (ref 1.005–1.030)
SQUAMOUS EPITHELIAL / LPF: NONE SEEN

## 2015-05-13 LAB — CBC WITH DIFFERENTIAL/PLATELET
BASOS PCT: 1 %
Basophils Absolute: 0 10*3/uL (ref 0–0.1)
EOS ABS: 0 10*3/uL (ref 0–0.7)
Eosinophils Relative: 1 %
HEMATOCRIT: 36.4 % (ref 35.0–47.0)
Hemoglobin: 12.1 g/dL (ref 12.0–16.0)
LYMPHS ABS: 2.4 10*3/uL (ref 1.0–3.6)
Lymphocytes Relative: 35 %
MCH: 28 pg (ref 26.0–34.0)
MCHC: 33.2 g/dL (ref 32.0–36.0)
MCV: 84.2 fL (ref 80.0–100.0)
Monocytes Absolute: 0.6 10*3/uL (ref 0.2–0.9)
Monocytes Relative: 9 %
NEUTROS ABS: 3.6 10*3/uL (ref 1.4–6.5)
NEUTROS PCT: 54 %
Platelets: 332 10*3/uL (ref 150–440)
RBC: 4.32 MIL/uL (ref 3.80–5.20)
RDW: 15.4 % — ABNORMAL HIGH (ref 11.5–14.5)
WBC: 6.7 10*3/uL (ref 3.6–11.0)

## 2015-05-13 LAB — COMPREHENSIVE METABOLIC PANEL
ALK PHOS: 75 U/L (ref 38–126)
ALT: 13 U/L — ABNORMAL LOW (ref 14–54)
ANION GAP: 9 (ref 5–15)
AST: 24 U/L (ref 15–41)
Albumin: 4.5 g/dL (ref 3.5–5.0)
BUN: 12 mg/dL (ref 6–20)
CALCIUM: 9.3 mg/dL (ref 8.9–10.3)
CO2: 26 mmol/L (ref 22–32)
Chloride: 104 mmol/L (ref 101–111)
Creatinine, Ser: 0.71 mg/dL (ref 0.44–1.00)
GFR calc non Af Amer: >60 mL/min (ref >60)
Glucose, Bld: 105 mg/dL — ABNORMAL HIGH (ref 65–99)
POTASSIUM: 3.6 mmol/L (ref 3.5–5.1)
SODIUM: 139 mmol/L (ref 135–145)
TOTAL PROTEIN: 7.8 g/dL (ref 6.5–8.1)
Total Bilirubin: 0.4 mg/dL (ref 0.3–1.2)

## 2015-05-13 LAB — URINE DRUG SCREEN, QUALITATIVE (ARMC ONLY)
AMPHETAMINES, UR SCREEN: NOT DETECTED
Barbiturates, Ur Screen: NOT DETECTED
Benzodiazepine, Ur Scrn: NOT DETECTED
COCAINE METABOLITE, UR ~~LOC~~: NOT DETECTED
Cannabinoid 50 Ng, Ur ~~LOC~~: NOT DETECTED
MDMA (ECSTASY) UR SCREEN: NOT DETECTED
METHADONE SCREEN, URINE: NOT DETECTED
Opiate, Ur Screen: NOT DETECTED
Phencyclidine (PCP) Ur S: NOT DETECTED
TRICYCLIC, UR SCREEN: NOT DETECTED

## 2015-05-13 LAB — ETHANOL: Alcohol, Ethyl (B): <5 mg/dL (ref <5)

## 2015-05-13 MED ORDER — PANTOPRAZOLE SODIUM 40 MG PO TBEC
40.0000 mg | DELAYED_RELEASE_TABLET | Freq: Every day | ORAL | Status: DC
  Administered 2015-05-13 – 2015-05-15 (×3): 40 mg via ORAL
  Filled 2015-05-13 (×2): qty 1

## 2015-05-13 MED ORDER — AMLODIPINE BESY-BENAZEPRIL HCL 2.5-10 MG PO CAPS: 1.0000 | ORAL_CAPSULE | Freq: Every day | ORAL | Status: DC

## 2015-05-13 MED ORDER — TEMAZEPAM 7.5 MG PO CAPS
15.0000 mg | ORAL_CAPSULE | Freq: Every evening | ORAL | Status: DC | PRN
  Administered 2015-05-13 – 2015-05-14 (×2): 15 mg via ORAL
  Filled 2015-05-13 (×2): qty 2

## 2015-05-13 MED ORDER — SIMVASTATIN 40 MG PO TABS
20.0000 mg | ORAL_TABLET | Freq: Every day | ORAL | Status: DC
  Administered 2015-05-13 – 2015-05-14 (×2): 20 mg via ORAL
  Filled 2015-05-13 (×2): qty 1

## 2015-05-13 MED ORDER — GABAPENTIN 400 MG PO CAPS
400.0000 mg | ORAL_CAPSULE | Freq: Four times a day (QID) | ORAL | Status: DC
  Administered 2015-05-13 – 2015-05-15 (×7): 400 mg via ORAL
  Filled 2015-05-13 (×6): qty 1

## 2015-05-13 MED ORDER — AMLODIPINE BESYLATE 5 MG PO TABS
2.5000 mg | ORAL_TABLET | Freq: Every day | ORAL | Status: DC
  Administered 2015-05-13 – 2015-05-15 (×3): 2.5 mg via ORAL
  Filled 2015-05-13 (×2): qty 1

## 2015-05-13 MED ORDER — BENAZEPRIL HCL 10 MG PO TABS
10.0000 mg | ORAL_TABLET | Freq: Every day | ORAL | Status: DC
  Administered 2015-05-13 – 2015-05-15 (×3): 10 mg via ORAL
  Filled 2015-05-13 (×3): qty 1

## 2015-05-13 MED ORDER — ALPRAZOLAM 0.5 MG PO TABS
0.2500 mg | ORAL_TABLET | Freq: Every evening | ORAL | Status: DC | PRN
  Administered 2015-05-13 – 2015-05-14 (×2): 0.25 mg via ORAL
  Filled 2015-05-13 (×2): qty 1

## 2015-05-13 MED ORDER — SERTRALINE HCL 100 MG PO TABS
100.0000 mg | ORAL_TABLET | Freq: Every day | ORAL | Status: DC
Start: 2015-05-13 — End: 2015-05-15
  Administered 2015-05-13 – 2015-05-14 (×2): 100 mg via ORAL
  Filled 2015-05-13 (×2): qty 1

## 2015-05-13 MED ORDER — SERTRALINE HCL 100 MG PO TABS: 100.0000 mg | ORAL_TABLET | Freq: Every day | ORAL | Status: DC

## 2015-05-13 MED ORDER — DIPHENHYDRAMINE HCL 12.5 MG/5ML PO ELIX
25.0000 mg | ORAL_SOLUTION | Freq: Once | ORAL | Status: AC
  Administered 2015-05-13: 25 mg via ORAL
  Filled 2015-05-13: qty 10
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
## 2015-05-13 NOTE — ED Notes (Signed)
Patient continues to c/o burning all over. Dr. Cinda Quest notified and order given to resume home medications.

## 2015-05-13 NOTE — ED Notes (Signed)
Lab notified to add UA to urine already in the lab.

## 2015-05-13 NOTE — ED Notes (Addendum)
Patient reports she lives at home along with her boyfriend and states she is unable to take of him or herself.  She states, "I've got too many problems." She also states, "I need sleep-I'm tired." Patient denies any SI/HI, but does report in 2014 she did try to kill herself by taking too much Zoloft because she was all alone at Christmas.  Fraser Din reports last night she had a fleeting thought that she would be better off dead, but denies having any intent or plan at that time.  Patient states she has not been sleeping well off and on for the past 3 weeks. Patient states she takes both Xanax and Ambien for sleep with no help even after her Dr. Increased the dose. Patient states she sees peoples faces when she has her eyes open and closed, she denies hearing voices, but can see their lips moving.  Patient is legally blind.  She states she feels bugs crawling on her and stinging and biting her and that sometimes the bugs are there and sometimes they are not.  She reports her family tells her she is crazy as well.  Her most recent complaint is she is always feeling like she is on fire all throughout her body and specifically her head has felt like its on fire for 3 weeks.  She c/o pain from her head to her toe and states that her neck is messed up from multiple falls in the past.  She has been seen by a neurologist in the past and had an MRI in Feb 2017.  SHe states she suffers from retinitis pigmentosa which her sister has too and uses a stick to assist with ambulating.  Pt requesting to go to an assisted living facility but is afraid because she owns a home and her daughter takes care of her finances.

## 2015-05-13 NOTE — ED Notes (Signed)
Patient states she has been seeing things, like people and bugs, x 3 weeks. States saw neurologist and had work up. States has been anxious and unable to sleep. States has lower back pain. States has had several falls. States had CT scan of head last February. Denies fevers. Pt is legally blind and vision is impaired but unable to tell me what she sees.

## 2015-05-14 DIAGNOSIS — F39 Unspecified mood [affective] disorder: Secondary | ICD-10-CM

## 2015-05-14 DIAGNOSIS — H5316 Psychophysical visual disturbances: Secondary | ICD-10-CM

## 2015-05-14 MED ORDER — AMLODIPINE BESYLATE 5 MG PO TABS
ORAL_TABLET | ORAL | Status: AC
  Administered 2015-05-14: 2.5 mg via ORAL
  Filled 2015-05-14: qty 1

## 2015-05-14 MED ORDER — GABAPENTIN 400 MG PO CAPS
ORAL_CAPSULE | ORAL | Status: AC
  Administered 2015-05-14: 400 mg via ORAL
  Filled 2015-05-14: qty 1

## 2015-05-14 MED ORDER — PANTOPRAZOLE SODIUM 40 MG PO TBEC
DELAYED_RELEASE_TABLET | ORAL | Status: AC
  Administered 2015-05-14: 40 mg via ORAL
  Filled 2015-05-14: qty 1

## 2015-05-14 MED ORDER — TRAMADOL HCL 50 MG PO TABS
100.0000 mg | ORAL_TABLET | Freq: Once | ORAL | Status: AC
  Administered 2015-05-14: 100 mg via ORAL
  Filled 2015-05-14: qty 2
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
## 2015-05-14 NOTE — ED Notes (Signed)
Pt meal tray placed in room, pt sleeping.

## 2015-05-14 NOTE — ED Notes (Signed)
Pt back in room.

## 2015-05-14 NOTE — ED Notes (Signed)
Psychiatrist at bedside

## 2015-05-14 NOTE — Progress Notes (Signed)
LCSW met with patient and explained that she will be able to go to either a ALF or a nursing home and provided her with a step by step how to process on how to obtain her Fl2 from Dr Merita Norton / select a facility /then move. Patient encouraged her to have her boyfriend and family doctor help with this process. She thanked Education officer, museum and wanted to return to sleep.  [employer] LCSW [phone]

## 2015-05-14 NOTE — ED Notes (Signed)
Pt eating lunch tray  

## 2015-05-14 NOTE — Clinical Social Work Note (Signed)
Clinical Social Work Assessment  Patient Details  Name: Sandra [name] MRN: [id number] Date of Birth: 1955-04-15  Date of referral:  05/14/15               Reason for consult:  Facility Placement                Permission sought to share information with:  Facility Sport and exercise psychologist, Family Supports Permission granted to share information::  Yes, Verbal Permission Granted  Name::     To talk to facilities but not her family  Agency::  yes  Relationship::  no  Contact Information:  no  Housing/Transportation Living arrangements for the past 2 months:  Mobile Home Source of Information:  Patient Patient Interpreter Needed:  None Criminal Activity/Legal Involvement Pertinent to Current Situation/Hospitalization:  No - Comment as needed Significant Relationships:  Adult Children, Significant Other, Parents Lives with:  Self, Significant Other Do you feel safe going back to the place where you live?  No Need for family participation in patient care:  No (Coment)  Care giving concerns: Patient reports she is very emotional and tires and just cant manage with her blindness   Social Worker assessment / plan: LCSW met with patient.  Verbal consent given to contact facilities but not her family.She reported she has a lot of financial issues and reports she has a house in her name, her son lives in, he pays the 1st mortgage, she pays for the 2nd mortgage but the bank is putting it in foreclosure. She also maintains she wants to go to a nursing home, understands she might not be able to afford it, she currently resides at a house she rents with her boyfriend and pays 500.00 for that. Patient was unable to stay focused on one issue and jumped around a lot, from family conflict, finances to her physical limitations. She IS BLIND and has difficulty with orientating to new environment. She is continent and does not require oxygen but does need assistance with bathing and eating, dressing  and  guiding her to and from rooms.  Employment status:  Disabled (Comment on whether or not currently receiving Disability) (Blind) Insurance information:  [employer] Agricultural engineer) PT Recommendations:  Not assessed at this time Information / Referral to community resources:  Other (Comment Required) (Information provided about  skilled nursing home or ALF)  Patient/Family's Response to care:  Unknown- patient repeats she wants to go to a nursing home but doesn't know how to go about it. Patient/Family's Understanding of and Emotional Response to Diagnosis, Current Treatment, and Prognosis:  She is very emotional and reports she is ready for a nursing home and then becomes concerned over finances, she feels hopeless  Emotional Assessment Appearance:  Appears stated age Attitude/Demeanor/Rapport:  Grieving, Guarded, Crying Affect (typically observed):  Tearful/Crying, Hopeless Orientation:  Oriented to Self, Oriented to Place, Oriented to  Time, Oriented to Situation Alcohol / Substance use:  Not Applicable Psych involvement (Current and /or in the community):  No (Comment)  Discharge Needs  Concerns to be addressed:  Financial / Insurance Concerns, Lack of Support, Coping/Stress Concerns, Home Safety Concerns, Discharge Planning Concerns Readmission within the last 30 days:    Current discharge risk:  Physical Impairment Barriers to Discharge:  Unsafe home situation, Family Issues   Joana Reamer, LCSW 05/14/2015, 9:45 AM

## 2015-05-14 NOTE — ED Notes (Signed)
Pt eating meal tray at this time. Pt alert and oriented X4, active, cooperative, pt in NAD. RR even and unlabored, color WNL.

## 2015-05-14 NOTE — ED Provider Notes (Signed)
-----------------------------------------   6:57 AM on 05/14/2015 -----------------------------------------  No events overnight. Patient resting in no acute distress. Awaiting social work consult today for placement.  Paulette Blanch, MD 05/14/15 [phone]

## 2015-05-14 NOTE — Progress Notes (Signed)
LCSW met with patient and she wants one minute to be in a nursing home, then jumps over to her family issues, then talks about her mothers life insurance and this LCSW feels she is not able to make appropriate decision as to were she will live in the future. In discussion patient reports she has a house she rents and lives with boyfriend ( she can return there) another house is in her name but son lives in that and it apparently is being foreclosed in May. Patient was supported and explained the process of finding a nursing home, list will be provided, discussed her family doctor should complete her FL2. HOWEVER, I did an Fl2 and completed assessment. Called [facility] and was told by admissions director that we need pre-authorization from [employer]. LCSW attempted to call but weekend not available.will try again.  Sandra Bellino LCSW
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
## 2015-05-14 NOTE — Progress Notes (Signed)
LCSW called [facility] and left a voice mail message for pre-authorization [phone] awaiting call back

## 2015-05-14 NOTE — Progress Notes (Signed)
LCSW requesting PT consult for patient. [place] called back and we reviewed patient situation and she has requested a PT consult to assess the level of care required. She might be suitable for ALF. Arley Garant LCSW

## 2015-05-14 NOTE — ED Notes (Signed)
Pt up to bathroom.

## 2015-05-14 NOTE — ED Notes (Signed)
Pt sleeping soundly, even and unlabored respirations, will continue to monitor  

## 2015-05-14 NOTE — ED Notes (Signed)
Social work at bedside.  

## 2015-05-14 NOTE — ED Notes (Signed)
Report to [place], Therapist, sports
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
## 2015-05-15 MED ORDER — ALPRAZOLAM 0.5 MG PO TABS
0.2500 mg | ORAL_TABLET | Freq: Once | ORAL | Status: AC
  Administered 2015-05-15: 0.25 mg via ORAL
  Filled 2015-05-15: qty 1

## 2015-05-15 NOTE — ED Notes (Signed)
Breakfast provided - meal set up  Stand by assistance provided to the BR

## 2015-05-15 NOTE — ED Notes (Signed)
Pt assisted to bathroom
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
## 2015-05-15 NOTE — ED Notes (Signed)
BEHAVIORAL HEALTH ROUNDING Patient sleeping: No. Patient alert and oriented: yes Behavior appropriate: Yes.  ; If no, describe:  Nutrition and fluids offered: yes Toileting and hygiene offered: Yes  Sitter present: q15 minute observations and security  monitoring Law enforcement present: Yes  ODS  

## 2015-05-15 NOTE — ED Notes (Signed)
Supper provided  Pt to be discharged to home

## 2015-05-15 NOTE — ED Provider Notes (Signed)
Pt referred to [facility] for skilled facility care.  D/W LCSW who notes that if pt is not accepted by [facility], son will plan to take her home and manage her safety while coordinating other options.    Pt is medically and psychiatrically stable for discharge from ED at this time.  VSS.    Final diagnoses:  Mood disorder [facility])  [name]     Carrie Mew, MD 05/15/15 1600

## 2015-05-15 NOTE — ED Notes (Signed)
ED BHU [place] Is the patient under IVC or is there intent for IVC:  voluntary Is the patient medically cleared: Yes.   Is there vacancy in the ED BHU: Yes.   Is the population mix appropriate for patient:  Is the patient awaiting placement in inpatient or outpatient setting:   Has the patient had a psychiatric consult:  Pt to discharge to home with her son    Survey of unit performed for contraband, proper placement and condition of furniture, tampering with fixtures in bathroom, shower, and each patient room: Yes.  ; Findings:  APPEARANCE/BEHAVIOR Calm and cooperative NEURO ASSESSMENT Orientation: oriented x3  Denies pain Hallucinations: No.None noted (Hallucinations) Speech: Normal Gait: normal RESPIRATORY ASSESSMENT Even  Unlabored respirations  CARDIOVASCULAR ASSESSMENT Pulses equal   regular rate  Skin warm and dry   GASTROINTESTINAL ASSESSMENT no GI complaint EXTREMITIES Full ROM  PLAN OF CARE Provide calm/safe environment. Vital signs assessed twice daily. ED BHU Assessment once each 12-hour shift. Collaborate with intake RN daily or as condition indicates. Assure the ED provider has rounded once each shift. Provide and encourage hygiene. Provide redirection as needed. Assess for escalating behavior; address immediately and inform ED provider.  Assess family dynamic and appropriateness for visitation as needed: Yes.  ; If necessary, describe findings:  Educate the patient/family about BHU procedures/visitation: Yes.  ; If necessary, describe findings:

## 2015-05-15 NOTE — ED Notes (Signed)
Patient observed lying in bed with eyes closed  Even, unlabored respirations observed   NAD pt appears to be sleeping  I will continue to monitor along with every 15 minute visual observations and ongoing security monitoring    

## 2015-05-15 NOTE — ED Notes (Signed)
Am meds to be administered as ordered   Assessment completed  She reports 10/10 pain - gabapentin to be administered
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
## 2015-05-15 NOTE — Progress Notes (Signed)
LCSW met with Clarene Critchley from [facility]. She was provided the patients information for possible bed placement today and contacted son  LCSW called son and he stated that if she can be placed for short term that would be good at [facility]. He will come either way at 4pm to meet with SW and his Mom.  [employer] LCSW [phone]

## 2015-05-15 NOTE — ED Notes (Signed)

## 2015-05-15 NOTE — ED Provider Notes (Signed)
-----------------------------------------   7:23 AM on 05/15/2015 -----------------------------------------   Blood pressure 151/74, pulse 67, temperature 97.9 F (36.6 C), temperature source Oral, resp. rate 16, height 4\' 1"  (1.245 m), weight 125 lb (56.7 kg), SpO2 98 %.  The patient had no acute events since last update.  Calm and cooperative at this time.  Disposition is pending per Psychiatry/Behavioral Medicine team recommendations.     Carrie Mew, MD 05/15/15 [phone]
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
## 2015-05-15 NOTE — ED Notes (Signed)
Pt assisted to bathroom, clothes changed

## 2015-05-15 NOTE — ED Notes (Signed)
Lunch provided.

## 2015-05-15 NOTE — Discharge Instructions (Signed)
Dysphoria Dysphoria is a condition in which a person feels unpleasant or uncomfortable. It may involve mood changes and feelings of sadness, anxiety, irritability, and restlessness. Dysphoria is often caused by normal life stress and it usually goes away within several days. Dysphoria that lasts longer than several days may be a symptom of a mental disorder, such as major depression or bipolar disorder. HOME CARE INSTRUCTIONS Monitor your mood for any changes. Take these steps to help with your discomfort and unpleasant feelings:  Take over-the-counter and prescription medicines only as told by your health care provider.  Check with your health care provider before taking any herbs or supplements.  Keep all follow-up visits as told by your health care provider.This is important.  Maintain a healthy lifestyle.  Eat a healthy diet.  Exercise regularly.  Get plenty of sleep.  Avoid alcohol and drugs.  Learn ways to reduce stress and cope with stress, such as with yoga and meditation.  Talk about your feelings with family members or health care providers.  Make time for yourself to do things that you enjoy. SEEK MEDICAL CARE IF:  You were given medicine and it does not seem to be helping.  You feel hopeless and overwhelmed.  You feel like you cannot leave your house.  You have trouble taking care of yourself. SEEK IMMEDIATE MEDICAL CARE IF:   You have serious thoughts about hurting yourself or others.   This information is not intended to replace advice given to you by your health care provider. Make sure you discuss any questions you have with your health care provider.   Document Released: 07/16/2005 Document Revised: 06/21/2014 Document Reviewed: 12/18/2013 Elsevier Interactive Patient Education [employer].

## 2015-05-16 ENCOUNTER — Ambulatory Visit: Payer: PPO | Admitting: Pain Medicine

## 2015-06-01 ENCOUNTER — Ambulatory Visit: Payer: Self-pay | Admitting: Psychiatry

## 2015-06-06 ENCOUNTER — Telehealth: Payer: Self-pay

## 2015-06-06 NOTE — Telephone Encounter (Signed)
Pt did not show up to her appointment on 06/01/2015 @ 2:00 to [facility] for hallucinations

## 2015-08-02 DIAGNOSIS — F321 Major depressive disorder, single episode, moderate: Secondary | ICD-10-CM | POA: Diagnosis not present

## 2015-08-02 DIAGNOSIS — J0100 Acute maxillary sinusitis, unspecified: Secondary | ICD-10-CM | POA: Diagnosis not present

## 2015-08-02 DIAGNOSIS — E782 Mixed hyperlipidemia: Secondary | ICD-10-CM | POA: Diagnosis not present

## 2015-08-02 DIAGNOSIS — F419 Anxiety disorder, unspecified: Secondary | ICD-10-CM | POA: Diagnosis not present

## 2015-08-02 DIAGNOSIS — H9201 Otalgia, right ear: Secondary | ICD-10-CM | POA: Diagnosis not present

## 2015-09-06 ENCOUNTER — Encounter: Payer: PPO | Admitting: Pain Medicine

## 2015-09-26 ENCOUNTER — Ambulatory Visit: Payer: PPO | Attending: Pain Medicine | Admitting: Pain Medicine

## 2015-09-26 ENCOUNTER — Encounter: Payer: Self-pay | Admitting: Pain Medicine

## 2015-09-26 ENCOUNTER — Telehealth: Payer: Self-pay | Admitting: Pain Medicine

## 2015-09-26 VITALS — BP 134/61 | HR 57 | Temp 98.2°F | Resp 16 | Ht 61.0 in | Wt 113.0 lb

## 2015-09-26 DIAGNOSIS — M47816 Spondylosis without myelopathy or radiculopathy, lumbar region: Secondary | ICD-10-CM

## 2015-09-26 DIAGNOSIS — M47896 Other spondylosis, lumbar region: Secondary | ICD-10-CM | POA: Insufficient documentation

## 2015-09-26 DIAGNOSIS — M25561 Pain in right knee: Secondary | ICD-10-CM | POA: Diagnosis not present

## 2015-09-26 DIAGNOSIS — F418 Other specified anxiety disorders: Secondary | ICD-10-CM | POA: Insufficient documentation

## 2015-09-26 DIAGNOSIS — H3552 Pigmentary retinal dystrophy: Secondary | ICD-10-CM | POA: Diagnosis not present

## 2015-09-26 DIAGNOSIS — E7801 Familial hypercholesterolemia: Secondary | ICD-10-CM | POA: Insufficient documentation

## 2015-09-26 DIAGNOSIS — M25562 Pain in left knee: Secondary | ICD-10-CM | POA: Insufficient documentation

## 2015-09-26 DIAGNOSIS — R739 Hyperglycemia, unspecified: Secondary | ICD-10-CM | POA: Diagnosis not present

## 2015-09-26 DIAGNOSIS — E782 Mixed hyperlipidemia: Secondary | ICD-10-CM | POA: Diagnosis not present

## 2015-09-26 DIAGNOSIS — I10 Essential (primary) hypertension: Secondary | ICD-10-CM | POA: Insufficient documentation

## 2015-09-26 DIAGNOSIS — M545 Low back pain: Secondary | ICD-10-CM

## 2015-09-26 DIAGNOSIS — H548 Legal blindness, as defined in USA: Secondary | ICD-10-CM | POA: Diagnosis not present

## 2015-09-26 DIAGNOSIS — Z87891 Personal history of nicotine dependence: Secondary | ICD-10-CM | POA: Diagnosis not present

## 2015-09-26 DIAGNOSIS — Z79891 Long term (current) use of opiate analgesic: Secondary | ICD-10-CM

## 2015-09-26 DIAGNOSIS — G4700 Insomnia, unspecified: Secondary | ICD-10-CM | POA: Diagnosis not present

## 2015-09-26 DIAGNOSIS — M170 Bilateral primary osteoarthritis of knee: Secondary | ICD-10-CM | POA: Insufficient documentation

## 2015-09-26 DIAGNOSIS — M47892 Other spondylosis, cervical region: Secondary | ICD-10-CM | POA: Insufficient documentation

## 2015-09-26 DIAGNOSIS — F1190 Opioid use, unspecified, uncomplicated: Secondary | ICD-10-CM

## 2015-09-26 DIAGNOSIS — R441 Visual hallucinations: Secondary | ICD-10-CM | POA: Diagnosis not present

## 2015-09-26 DIAGNOSIS — Z7982 Long term (current) use of aspirin: Secondary | ICD-10-CM | POA: Insufficient documentation

## 2015-09-26 DIAGNOSIS — M542 Cervicalgia: Secondary | ICD-10-CM | POA: Insufficient documentation

## 2015-09-26 DIAGNOSIS — Z9049 Acquired absence of other specified parts of digestive tract: Secondary | ICD-10-CM | POA: Diagnosis not present

## 2015-09-26 DIAGNOSIS — K644 Residual hemorrhoidal skin tags: Secondary | ICD-10-CM | POA: Diagnosis not present

## 2015-09-26 DIAGNOSIS — F39 Unspecified mood [affective] disorder: Secondary | ICD-10-CM | POA: Insufficient documentation

## 2015-09-26 DIAGNOSIS — K219 Gastro-esophageal reflux disease without esophagitis: Secondary | ICD-10-CM | POA: Diagnosis not present

## 2015-09-26 DIAGNOSIS — Z8711 Personal history of peptic ulcer disease: Secondary | ICD-10-CM | POA: Insufficient documentation

## 2015-09-26 DIAGNOSIS — Z915 Personal history of self-harm: Secondary | ICD-10-CM | POA: Diagnosis not present

## 2015-09-26 DIAGNOSIS — M5416 Radiculopathy, lumbar region: Secondary | ICD-10-CM

## 2015-09-26 DIAGNOSIS — G8929 Other chronic pain: Secondary | ICD-10-CM | POA: Diagnosis not present

## 2015-09-26 DIAGNOSIS — F410 Panic disorder [episodic paroxysmal anxiety] without agoraphobia: Secondary | ICD-10-CM | POA: Insufficient documentation

## 2015-09-26 DIAGNOSIS — Z90710 Acquired absence of both cervix and uterus: Secondary | ICD-10-CM | POA: Insufficient documentation

## 2015-09-26 DIAGNOSIS — K449 Diaphragmatic hernia without obstruction or gangrene: Secondary | ICD-10-CM | POA: Diagnosis not present

## 2015-09-26 DIAGNOSIS — K6289 Other specified diseases of anus and rectum: Secondary | ICD-10-CM | POA: Diagnosis not present

## 2015-09-26 DIAGNOSIS — M792 Neuralgia and neuritis, unspecified: Secondary | ICD-10-CM

## 2015-09-26 DIAGNOSIS — M791 Myalgia: Secondary | ICD-10-CM | POA: Insufficient documentation

## 2015-09-26 DIAGNOSIS — R102 Pelvic and perineal pain: Secondary | ICD-10-CM | POA: Insufficient documentation

## 2015-09-26 MED ORDER — TRAMADOL HCL 50 MG PO TABS: 100.0000 mg | ORAL_TABLET | Freq: Four times a day (QID) | ORAL | 5 refills | Status: DC | PRN

## 2015-09-26 MED ORDER — GABAPENTIN 400 MG PO CAPS: 400.0000 mg | ORAL_CAPSULE | Freq: Four times a day (QID) | ORAL | 5 refills | Status: DC

## 2015-09-26 NOTE — Telephone Encounter (Signed)
Patient wants to know should she wait until after the procedure is approved and completed to get her flu shot ?

## 2015-09-26 NOTE — Patient Instructions (Signed)
Facet Blocks Patient Information  Description: The facets are joints in the spine between the vertebrae.  Like any joints in the body, facets can become irritated and painful.  Arthritis can also effect the facets.  By injecting steroids and local anesthetic in and around these joints, we can temporarily block the nerve supply to them.  Steroids act directly on irritated nerves and tissues to reduce selling and inflammation which often leads to decreased pain.  Facet blocks may be done anywhere along the spine from the neck to the low back depending upon the location of your pain.   After numbing the skin with local anesthetic (like Novocaine), a small needle is passed onto the facet joints under x-ray guidance.  You may experience a sensation of pressure while this is being done.  The entire block usually lasts about 15-25 minutes.   Conditions which may be treated by facet blocks:   Low back/buttock pain  Neck/shoulder pain  Certain types of headaches  Preparation for the injection:  1. Do not eat any solid food or dairy products within 8 hours of your appointment. 2. You may drink clear liquid up to 3 hours before appointment.  Clear liquids include water, black coffee, juice or soda.  No milk or cream please. 3. You may take your regular medication, including pain medications, with a sip of water before your appointment.  Diabetics should hold regular insulin (if taken separately) and take 1/2 normal NPH dose the morning of the procedure.  Carry some sugar containing items with you to your appointment. 4. A driver must accompany you and be prepared to drive you home after your procedure. 5. Bring all your current medications with you. 6. An IV may be inserted and sedation may be given at the discretion of the physician. 7. A blood pressure cuff, EKG and other monitors will often be applied during the procedure.  Some patients may need to have extra oxygen administered for a short  period. 8. You will be asked to provide medical information, including your allergies and medications, prior to the procedure.  We must know immediately if you are taking blood thinners (like Coumadin/Warfarin) or if you are allergic to IV iodine contrast (dye).  We must know if you could possible be pregnant.  Possible side-effects:   Bleeding from needle site  Infection (rare, may require surgery)  Nerve injury (rare)  Numbness & tingling (temporary)  Difficulty urinating (rare, temporary)  Spinal headache (a headache worse with upright posture)  Light-headedness (temporary)  Pain at injection site (serveral days)  Decreased blood pressure (rare, temporary)  Weakness in arm/leg (temporary)  Pressure sensation in back/neck (temporary)   Call if you experience:   Fever/chills associated with headache or increased back/neck pain  Headache worsened by an upright position  New onset, weakness or numbness of an extremity below the injection site  Hives or difficulty breathing (go to the emergency room)  Inflammation or drainage at the injection site(s)  Severe back/neck pain greater than usual  New symptoms which are concerning to you  Please note:  Although the local anesthetic injected can often make your back or neck feel good for several hours after the injection, the pain will likely return. It takes 3-7 days for steroids to work.  You may not notice any pain relief for at least one week.  If effective, we will often do a series of 2-3 injections spaced 3-6 weeks apart to maximally decrease your pain.  After the initial   series, you may be a candidate for a more permanent nerve block of the facets.  If you have any questions, please call [phone] [facility] 

## 2015-09-26 NOTE — Telephone Encounter (Signed)
Attempted to reach patient, no voicemail capability.  °

## 2015-09-26 NOTE — Progress Notes (Signed)
Patient here c/o pain in the buttocks region that she relates to hemorrhoidal type pain, also lower back and bilateral knee pain.  Patient is legally blind and has been informed of the new pain scale.  Patient is taking two sleep aides along with Xanax .50 mg QHS for anxiety and sleep.   Safety precautions to be maintained throughout the outpatient stay will include: orient to surroundings, keep bed in low position, maintain call bell within reach at all times, provide assistance with transfer out of bed and ambulation.
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
## 2015-09-27 NOTE — Telephone Encounter (Signed)
Patient advised to schedule procedure appointment outside of 2 weeks before or after flu shot.

## 2015-10-04 DIAGNOSIS — H3552 Pigmentary retinal dystrophy: Secondary | ICD-10-CM | POA: Diagnosis not present

## 2015-10-24 ENCOUNTER — Encounter: Payer: Self-pay | Admitting: Pain Medicine

## 2015-10-24 ENCOUNTER — Ambulatory Visit: Payer: PPO | Attending: Pain Medicine | Admitting: Pain Medicine

## 2015-10-24 VITALS — BP 145/65 | HR 70 | Temp 97.1°F | Resp 16 | Ht 61.0 in | Wt 113.0 lb

## 2015-10-24 DIAGNOSIS — M791 Myalgia: Secondary | ICD-10-CM | POA: Insufficient documentation

## 2015-10-24 DIAGNOSIS — Z90710 Acquired absence of both cervix and uterus: Secondary | ICD-10-CM | POA: Insufficient documentation

## 2015-10-24 DIAGNOSIS — M170 Bilateral primary osteoarthritis of knee: Secondary | ICD-10-CM | POA: Insufficient documentation

## 2015-10-24 DIAGNOSIS — M62830 Muscle spasm of back: Secondary | ICD-10-CM | POA: Diagnosis not present

## 2015-10-24 DIAGNOSIS — R102 Pelvic and perineal pain: Secondary | ICD-10-CM | POA: Insufficient documentation

## 2015-10-24 DIAGNOSIS — F410 Panic disorder [episodic paroxysmal anxiety] without agoraphobia: Secondary | ICD-10-CM | POA: Diagnosis not present

## 2015-10-24 DIAGNOSIS — Z9049 Acquired absence of other specified parts of digestive tract: Secondary | ICD-10-CM | POA: Insufficient documentation

## 2015-10-24 DIAGNOSIS — R441 Visual hallucinations: Secondary | ICD-10-CM | POA: Insufficient documentation

## 2015-10-24 DIAGNOSIS — N816 Rectocele: Secondary | ICD-10-CM | POA: Insufficient documentation

## 2015-10-24 DIAGNOSIS — E782 Mixed hyperlipidemia: Secondary | ICD-10-CM | POA: Insufficient documentation

## 2015-10-24 DIAGNOSIS — M25561 Pain in right knee: Secondary | ICD-10-CM | POA: Insufficient documentation

## 2015-10-24 DIAGNOSIS — Z87891 Personal history of nicotine dependence: Secondary | ICD-10-CM | POA: Insufficient documentation

## 2015-10-24 DIAGNOSIS — E785 Hyperlipidemia, unspecified: Secondary | ICD-10-CM | POA: Diagnosis not present

## 2015-10-24 DIAGNOSIS — G4700 Insomnia, unspecified: Secondary | ICD-10-CM | POA: Diagnosis not present

## 2015-10-24 DIAGNOSIS — F99 Mental disorder, not otherwise specified: Secondary | ICD-10-CM | POA: Insufficient documentation

## 2015-10-24 DIAGNOSIS — M545 Low back pain: Secondary | ICD-10-CM | POA: Diagnosis not present

## 2015-10-24 DIAGNOSIS — H3552 Pigmentary retinal dystrophy: Secondary | ICD-10-CM | POA: Diagnosis not present

## 2015-10-24 DIAGNOSIS — Z79891 Long term (current) use of opiate analgesic: Secondary | ICD-10-CM | POA: Diagnosis not present

## 2015-10-24 DIAGNOSIS — F411 Generalized anxiety disorder: Secondary | ICD-10-CM | POA: Diagnosis not present

## 2015-10-24 DIAGNOSIS — H548 Legal blindness, as defined in USA: Secondary | ICD-10-CM | POA: Diagnosis not present

## 2015-10-24 DIAGNOSIS — G8929 Other chronic pain: Secondary | ICD-10-CM | POA: Diagnosis not present

## 2015-10-24 DIAGNOSIS — K644 Residual hemorrhoidal skin tags: Secondary | ICD-10-CM | POA: Insufficient documentation

## 2015-10-24 DIAGNOSIS — Z8711 Personal history of peptic ulcer disease: Secondary | ICD-10-CM | POA: Diagnosis not present

## 2015-10-24 DIAGNOSIS — M7918 Myalgia, other site: Secondary | ICD-10-CM

## 2015-10-24 DIAGNOSIS — R739 Hyperglycemia, unspecified: Secondary | ICD-10-CM | POA: Insufficient documentation

## 2015-10-24 DIAGNOSIS — M47816 Spondylosis without myelopathy or radiculopathy, lumbar region: Secondary | ICD-10-CM | POA: Diagnosis not present

## 2015-10-24 DIAGNOSIS — I10 Essential (primary) hypertension: Secondary | ICD-10-CM | POA: Insufficient documentation

## 2015-10-24 DIAGNOSIS — F329 Major depressive disorder, single episode, unspecified: Secondary | ICD-10-CM | POA: Insufficient documentation

## 2015-10-24 DIAGNOSIS — M1288 Other specific arthropathies, not elsewhere classified, other specified site: Secondary | ICD-10-CM | POA: Diagnosis not present

## 2015-10-24 DIAGNOSIS — K219 Gastro-esophageal reflux disease without esophagitis: Secondary | ICD-10-CM | POA: Insufficient documentation

## 2015-10-24 DIAGNOSIS — F39 Unspecified mood [affective] disorder: Secondary | ICD-10-CM | POA: Insufficient documentation

## 2015-10-24 DIAGNOSIS — Z915 Personal history of self-harm: Secondary | ICD-10-CM | POA: Insufficient documentation

## 2015-10-24 DIAGNOSIS — K449 Diaphragmatic hernia without obstruction or gangrene: Secondary | ICD-10-CM | POA: Insufficient documentation

## 2015-10-24 DIAGNOSIS — M25562 Pain in left knee: Secondary | ICD-10-CM | POA: Insufficient documentation

## 2015-10-24 DIAGNOSIS — Z91410 Personal history of adult physical and sexual abuse: Secondary | ICD-10-CM | POA: Insufficient documentation

## 2015-10-24 DIAGNOSIS — M4726 Other spondylosis with radiculopathy, lumbar region: Secondary | ICD-10-CM | POA: Insufficient documentation

## 2015-10-24 DIAGNOSIS — M4722 Other spondylosis with radiculopathy, cervical region: Secondary | ICD-10-CM | POA: Insufficient documentation

## 2015-10-24 MED ORDER — LACTATED RINGERS IV SOLN: 1000.0000 mL | Freq: Once | INTRAVENOUS | Status: DC

## 2015-10-24 MED ORDER — TRIAMCINOLONE ACETONIDE 40 MG/ML IJ SUSP
40.0000 mg | Freq: Once | INTRAMUSCULAR | Status: AC
  Administered 2015-10-24: 40 mg
  Filled 2015-10-24: qty 1

## 2015-10-24 MED ORDER — ROPIVACAINE HCL 2 MG/ML IJ SOLN
9.0000 mL | Freq: Once | INTRAMUSCULAR | Status: AC
  Administered 2015-10-24: 9 mL
  Filled 2015-10-24: qty 10

## 2015-10-24 MED ORDER — CYCLOBENZAPRINE HCL 10 MG PO TABS: 10.0000 mg | ORAL_TABLET | Freq: Two times a day (BID) | ORAL | 5 refills | Status: DC | PRN

## 2015-10-24 MED ORDER — TRIAMCINOLONE ACETONIDE 40 MG/ML IJ SUSP
INTRAMUSCULAR | Status: AC
  Administered 2015-10-24: 15:00:00
  Filled 2015-10-24: qty 1

## 2015-10-24 MED ORDER — LIDOCAINE HCL (PF) 1 % IJ SOLN
10.0000 mL | Freq: Once | INTRAMUSCULAR | Status: AC
  Administered 2015-10-24: 10 mL

## 2015-10-24 MED ORDER — FENTANYL CITRATE (PF) 100 MCG/2ML IJ SOLN
25.0000 ug | INTRAMUSCULAR | Status: DC | PRN
  Filled 2015-10-24: qty 2

## 2015-10-24 MED ORDER — MIDAZOLAM HCL 5 MG/5ML IJ SOLN
1.0000 mg | INTRAMUSCULAR | Status: DC | PRN
  Filled 2015-10-24: qty 5

## 2015-10-24 NOTE — Progress Notes (Signed)
1429 Versed 1 mg IV, Fentanyl 50 mcg IV

## 2015-10-24 NOTE — Patient Instructions (Addendum)
Take a multivitamin once per day.Pain Management Discharge Instructions  General Discharge Instructions :  If you need to reach your doctor call: Monday-Friday 8:00 am - 4:00 pm at [phone] or toll free [phone].  After clinic hours [phone] to have operator reach doctor.  Bring all of your medication bottles to all your appointments in the pain clinic.  To cancel or reschedule your appointment with Pain Management please remember to call 24 hours in advance to avoid a fee.  Refer to the educational materials which you have been given on: General Risks, I had my Procedure. Discharge Instructions, Post Sedation.  Post Procedure Instructions:  The drugs you were given will stay in your system until tomorrow, so for the next 24 hours you should not drive, make any legal decisions or drink any alcoholic beverages.  You may eat anything you prefer, but it is better to start with liquids then soups and crackers, and gradually work up to solid foods.  Please notify your doctor immediately if you have any unusual bleeding, trouble breathing or pain that is not related to your normal pain.  Depending on the type of procedure that was done, some parts of your body may feel week and/or numb.  This usually clears up by tonight or the next day.  Walk with the use of an assistive device or accompanied by an adult for the 24 hours.  You may use ice on the affected area for the first 24 hours.  Put ice in a Ziploc bag and cover with a towel and place against area 15 minutes on 15 minutes off.  You may switch to heat after 24 hours.Facet Joint Block, Care After Refer to this sheet in the next few weeks. These instructions provide you with information on caring for yourself after your procedure. Your health care provider may also give you more specific instructions. Your treatment has been planned according to current medical practices, but problems sometimes occur. Call your health care  provider if you have any problems or questions after your procedure. HOME CARE INSTRUCTIONS   Keep track of the amount of pain relief you feel and how long it lasts.  Limit pain medicine within the first 4-6 hours after the procedure as directed by your health care provider.  Resume taking dietary supplements and medicines as directed by your health care provider.  You may resume your regular diet.  Do not apply heat near or over the injection site(s) for 24 hours.   Do not take a bath or soak in water (such as a pool or lake) for 24 hours.  Do not drive for 24 hours unless approved by your health care provider.  Avoid strenuous activity for 24 hours.  Remove your bandages the morning after the procedure.   If the injection site is tender, applying an ice pack may relieve some tenderness. To do this:  Put ice in a bag.  Place a towel between your skin and the bag.  Leave the ice on for 15-20 minutes, 3-4 times a day.  Keep follow-up appointments as directed by your health care provider. SEEK MEDICAL CARE IF:   Your pain is not controlled by your medicines.   There is drainage from the injection site.   There is significant bleeding or swelling at the injection site.  You have diabetes and your blood sugar is above 180 mg/dL. SEEK IMMEDIATE MEDICAL CARE IF:   You develop a fever of 101F (38.3C) or greater.   You have  worsening pain or swelling around the injection site.   You have red streaking around the injection site.   You develop severe pain that is not controlled by your medicines.   You develop a headache, stiff neck, nausea, or vomiting.   Your eyes become very sensitive to light.   You have weakness, paralysis, or tingling in your arms or legs that was not present before the procedure.   You develop difficulty urinating or breathing.    This information is not intended to replace advice given to you by your health care provider. Make  sure you discuss any questions you have with your health care provider.   Document Released: 01/22/2012 Document Revised: 02/25/2014 Document Reviewed: 01/22/2012 Elsevier Interactive Patient Education 2016 [employer] Facet Joint Block The facet joints connect the bones of the spine (vertebrae). They make it possible for you to bend, twist, and make other movements with your spine. They also prevent you from overbending, overtwisting, and making other excessive movements.  A facet joint block is a procedure where a numbing medicine (anesthetic) is injected into a facet joint. Often, a type of anti-inflammatory medicine called a steroid is also injected. A facet joint block may be done for two reasons:   Diagnosis. A facet joint block may be done as a test to see whether neck or back pain is caused by a worn-down or infected facet joint. If the pain gets better after a facet joint block, it means the pain is probably coming from the facet joint. If the pain does not get better, it means the pain is probably not coming from the facet joint.   Therapy. A facet joint block may be done to relieve neck or back pain caused by a facet joint. A facet joint block is only done as a therapy if the pain does not improve with medicine, exercise programs, physical therapy, and other forms of pain management. LET [facility] CARE PROVIDER KNOW ABOUT:   Any allergies you have.   All medicines you are taking, including vitamins, herbs, eyedrops, and over-the-counter medicines and creams.   Previous problems you or members of your family have had with the use of anesthetics.   Any blood disorders you have had.   Other health problems you have. RISKS AND COMPLICATIONS Generally, having a facet joint block is safe. However, as with any procedure, complications can occur. Possible complications associated with having a facet joint block include:   Bleeding.   Injury to a nerve near the injection site.    Pain at the injection site.   Weakness or numbness in areas controlled by nerves near the injection site.   Infection.   Temporary fluid retention.   Allergic reaction to anesthetics or medicines used during the procedure. BEFORE THE PROCEDURE   Follow your health care provider's instructions if you are taking dietary supplements or medicines. You may need to stop taking them or reduce your dosage.   Do not take any new dietary supplements or medicines without asking your health care provider first.   Follow your health care provider's instructions about eating and drinking before the procedure. You may need to stop eating and drinking several hours before the procedure.   Arrange to have an adult drive you home after the procedure. PROCEDURE  You may need to remove your clothing and dress in an open-back gown so that your health care provider can access your spine.   The procedure will be done while you are lying  on an X-ray table. Most of the time you will be asked to lie on your stomach, but you may be asked to lie in a different position if an injection will be made in your neck.   Special machines will be used to monitor your oxygen levels, heart rate, and blood pressure.   If an injection will be made in your neck, an intravenous (IV) tube will be inserted into one of your veins. Fluids and medicine will flow directly into your body through the IV tube.   The area over the facet joint where the injection will be made will be cleaned with an antiseptic soap. The surrounding skin will be covered with sterile drapes.   An anesthetic will be applied to your skin to make the injection area numb. You may feel a temporary stinging or burning sensation.   A video X-ray machine will be used to locate the joint. A contrast dye may be injected into the facet joint area to help with locating the joint.   When the joint is located, an anesthetic medicine will be injected  into the joint through the needle.   Your health care provider will ask you whether you feel pain relief. If you do feel relief, a steroid may be injected to provide pain relief for a longer period of time. If you do not feel relief or feel only partial relief, additional injections of an anesthetic may be made in other facet joints.   The needle will be removed, the skin will be cleansed, and bandages will be applied.  AFTER THE PROCEDURE   You will be observed for 15-30 minutes before being allowed to go home. Do not drive. Have an adult drive you or take a taxi or public transportation instead.   If you feel pain relief, the pain will return in several hours or days when the anesthetic wears off.   You may feel pain relief 2-14 days after the procedure. The amount of time this relief lasts varies from person to person.   It is normal to feel some tenderness over the injected area(s) for 2 days following the procedure.   If you have diabetes, you may have a temporary increase in blood sugar.   This information is not intended to replace advice given to you by your health care provider. Make sure you discuss any questions you have with your health care provider.   Document Released: 06/26/2006 Document Revised: 02/25/2014 Document Reviewed: 11/25/2011 Elsevier Interactive Patient Education 2016 [place]  What are the risk, side effects and possible complications? Generally speaking, most procedures are safe.  However, with any procedure there are risks, side effects, and the possibility of complications.  The risks and complications are dependent upon the sites that are lesioned, or the type of nerve block to be performed.  The closer the procedure is to the spine, the more serious the risks are.  Great care is taken when placing the radio frequency needles, block needles or lesioning probes, but sometimes complications can occur. 1. Infection:  Any time there is an injection through the skin, there is a risk of infection.  This is why sterile conditions are used for these blocks.  There are four possible types of infection. 1. Localized skin infection. 2. Central Nervous System Infection-This can be in the form of Meningitis, which can be deadly. 3. Epidural Infections-This can be in the form of an epidural abscess, which can cause pressure inside of  the spine, causing compression of the spinal cord with subsequent paralysis. This would require an emergency surgery to decompress, and there are no guarantees that the patient would recover from the paralysis. 4. Discitis-This is an infection of the intervertebral discs.  It occurs in about 1% of discography procedures.  It is difficult to treat and it may lead to surgery.        2. Pain: the needles have to go through skin and soft tissues, will cause soreness.       3. Damage to internal structures:  The nerves to be lesioned may be near blood vessels or    other nerves which can be potentially damaged.       4. Bleeding: Bleeding is more common if the patient is taking blood thinners such as  aspirin, Coumadin, Ticiid, Plavix, etc., or if he/she have some genetic predisposition  such as hemophilia. Bleeding into the spinal canal can cause compression of the spinal  cord with subsequent paralysis.  This would require an emergency surgery to  decompress and there are no guarantees that the patient would recover from the  paralysis.       5. Pneumothorax:  Puncturing of a lung is a possibility, every time a needle is introduced in  the area of the chest or upper back.  Pneumothorax refers to free air around the  collapsed lung(s), inside of the thoracic cavity (chest cavity).  Another two possible  complications related to a similar event would include: Hemothorax and Chylothorax.   These are variations of the Pneumothorax, where instead of air around the collapsed  lung(s), you may have blood or  chyle, respectively.       6. Spinal headaches: They may occur with any procedures in the area of the spine.       7. Persistent CSF (Cerebro-Spinal Fluid) leakage: This is a rare problem, but may occur  with prolonged intrathecal or epidural catheters either due to the formation of a fistulous  track or a dural tear.       8. Nerve damage: By working so close to the spinal cord, there is always a possibility of  nerve damage, which could be as serious as a permanent spinal cord injury with  paralysis.       9. Death:  Although rare, severe deadly allergic reactions known as "Anaphylactic  reaction" can occur to any of the medications used.      10. Worsening of the symptoms:  We can always make thing worse.  What are the chances of something like this happening? Chances of any of this occuring are extremely low.  By statistics, you have more of a chance of getting killed in a motor vehicle accident: while driving to the hospital than any of the above occurring .  Nevertheless, you should be aware that they are possibilities.  In general, it is similar to taking a shower.  Everybody knows that you can slip, hit your head and get killed.  Does that mean that you should not shower again?  Nevertheless always keep in mind that statistics do not mean anything if you happen to be on the wrong side of them.  Even if a procedure has a 1 (one) in a 1,000,000 (million) chance of going wrong, it you happen to be that one..Also, keep in mind that by statistics, you have more of a chance of having something go wrong when taking medications.  Who should not have this procedure? If you  are on a blood thinning medication (e.g. Coumadin, Plavix, see list of "Blood Thinners"), or if you have an active infection going on, you should not have the procedure.  If you are taking any blood thinners, please inform your physician.  How should I prepare for this procedure?  Do not eat or drink anything at least six hours prior  to the procedure.  Bring a driver with you .  It cannot be a taxi.  Come accompanied by an adult that can drive you back, and that is strong enough to help you if your legs get weak or numb from the local anesthetic.  Take all of your medicines the morning of the procedure with just enough water to swallow them.  If you have diabetes, make sure that you are scheduled to have your procedure done first thing in the morning, whenever possible.  If you have diabetes, take only half of your insulin dose and notify our nurse that you have done so as soon as you arrive at the clinic.  If you are diabetic, but only take blood sugar pills (oral hypoglycemic), then do not take them on the morning of your procedure.  You may take them after you have had the procedure.  Do not take aspirin or any aspirin-containing medications, at least eleven (11) days prior to the procedure.  They may prolong bleeding.  Wear loose fitting clothing that may be easy to take off and that you would not mind if it got stained with Betadine or blood.  Do not wear any jewelry or perfume  Remove any nail coloring.  It will interfere with some of our monitoring equipment.  NOTE: Remember that this is not meant to be interpreted as a complete list of all possible complications.  Unforeseen problems may occur.  BLOOD THINNERS The following drugs contain aspirin or other products, which can cause increased bleeding during surgery and should not be taken for 2 weeks prior to and 1 week after surgery.  If you should need take something for relief of minor pain, you may take acetaminophen which is found in Tylenol,m Datril, Anacin-3 and Panadol. It is not blood thinner. The products listed below are.  Do not take any of the products listed below in addition to any listed on your instruction sheet.  A.P.C or A.P.C with Codeine Codeine Phosphate Capsules #3 Ibuprofen Ridaura  ABC compound Congesprin Imuran rimadil  Advil Cope  Indocin Robaxisal  Alka-Seltzer Effervescent Pain Reliever and Antacid Coricidin or Coricidin-D  Indomethacin Rufen  Alka-Seltzer plus Cold Medicine Cosprin Ketoprofen S-A-C Tablets  Anacin Analgesic Tablets or Capsules Coumadin Korlgesic Salflex  Anacin Extra Strength Analgesic tablets or capsules CP-2 Tablets Lanoril Salicylate  Anaprox Cuprimine Capsules Levenox Salocol  Anexsia-D Dalteparin Magan Salsalate  Anodynos Darvon compound Magnesium Salicylate Sine-off  Ansaid Dasin Capsules Magsal Sodium Salicylate  Anturane Depen Capsules Marnal Soma  APF Arthritis pain formula Dewitt's Pills Measurin Stanback  Argesic Dia-Gesic Meclofenamic Sulfinpyrazone  Arthritis Bayer Timed Release Aspirin Diclofenac Meclomen Sulindac  Arthritis pain formula Anacin Dicumarol Medipren Supac  Analgesic (Safety coated) Arthralgen Diffunasal Mefanamic Suprofen  Arthritis Strength Bufferin Dihydrocodeine Mepro Compound Suprol  Arthropan liquid Dopirydamole Methcarbomol with Aspirin Synalgos  ASA tablets/Enseals Disalcid Micrainin Tagament  Ascriptin Doan's Midol Talwin  Ascriptin A/D Dolene Mobidin Tanderil  Ascriptin Extra Strength Dolobid Moblgesic Ticlid  Ascriptin with Codeine Doloprin or Doloprin with Codeine Momentum Tolectin  Asperbuf Duoprin Mono-gesic Trendar  Aspergum Duradyne Motrin or Motrin IB Triminicin  Aspirin plain,  buffered or enteric coated Durasal Myochrisine Trigesic  Aspirin Suppositories Easprin Nalfon Trillsate  Aspirin with Codeine Ecotrin Regular or Extra Strength Naprosyn Uracel  Atromid-S Efficin Naproxen Ursinus  Auranofin Capsules Elmiron Neocylate Vanquish  Axotal Emagrin Norgesic Verin  Azathioprine Empirin or Empirin with Codeine Normiflo Vitamin E  Azolid Emprazil Nuprin Voltaren  Bayer Aspirin plain, buffered or children's or timed BC Tablets or powders Encaprin Orgaran Warfarin Sodium  Buff-a-Comp Enoxaparin Orudis Zorpin  Buff-a-Comp with Codeine Equegesic  Os-Cal-Gesic   Buffaprin Excedrin plain, buffered or Extra Strength Oxalid   Bufferin Arthritis Strength Feldene Oxphenbutazone   Bufferin plain or Extra Strength Feldene Capsules Oxycodone with Aspirin   Bufferin with Codeine Fenoprofen Fenoprofen Pabalate or Pabalate-SF   Buffets II Flogesic Panagesic   Buffinol plain or Extra Strength Florinal or Florinal with Codeine Panwarfarin   Buf-Tabs Flurbiprofen Penicillamine   Butalbital Compound Four-way cold tablets Penicillin   Butazolidin Fragmin Pepto-Bismol   Carbenicillin Geminisyn Percodan   Carna Arthritis Reliever Geopen Persantine   Carprofen Gold's salt Persistin   Chloramphenicol Goody's Phenylbutazone   Chloromycetin Haltrain Piroxlcam   Clmetidine heparin Plaquenil   Cllnoril Hyco-pap Ponstel   Clofibrate Hydroxy chloroquine Propoxyphen         Before stopping any of these medications, be sure to consult the physician who ordered them.  Some, such as Coumadin (Warfarin) are ordered to prevent or treat serious conditions such as "deep thrombosis", "pumonary embolisms", and other heart problems.  The amount of time that you may need off of the medication may also vary with the medication and the reason for which you were taking it.  If you are taking any of these medications, please make sure you notify your pain physician before you undergo any procedures.     Pain Management Discharge Instructions  General Discharge Instructions :  If you need to reach your doctor call: Monday-Friday 8:00 am - 4:00 pm at [phone] or toll free [phone].  After clinic hours [phone] to have operator reach doctor.  Bring all of your medication bottles to all your appointments in the pain clinic.  To cancel or reschedule your appointment with Pain Management please remember to call 24 hours in advance to avoid a fee.  Refer to the educational materials which you have been given on: General Risks, I had my Procedure. Discharge  Instructions, Post Sedation.  Post Procedure Instructions:  The drugs you were given will stay in your system until tomorrow, so for the next 24 hours you should not drive, make any legal decisions or drink any alcoholic beverages.  You may eat anything you prefer, but it is better to start with liquids then soups and crackers, and gradually work up to solid foods.  Please notify your doctor immediately if you have any unusual bleeding, trouble breathing or pain that is not related to your normal pain.  Depending on the type of procedure that was done, some parts of your body may feel week and/or numb.  This usually clears up by tonight or the next day.  Walk with the use of an assistive device or accompanied by an adult for the 24 hours.  You may use ice on the affected area for the first 24 hours.  Put ice in a Ziploc bag and cover with a towel and place against area 15 minutes on 15 minutes off.  You may switch to heat after 24 hours.Facet Joint Block, Care After Refer to this sheet in the next few weeks. These instructions  provide you with information on caring for yourself after your procedure. Your health care provider may also give you more specific instructions. Your treatment has been planned according to current medical practices, but problems sometimes occur. Call your health care provider if you have any problems or questions after your procedure. HOME CARE INSTRUCTIONS   Keep track of the amount of pain relief you feel and how long it lasts.  Limit pain medicine within the first 4-6 hours after the procedure as directed by your health care provider.  Resume taking dietary supplements and medicines as directed by your health care provider.  You may resume your regular diet.  Do not apply heat near or over the injection site(s) for 24 hours.   Do not take a bath or soak in water (such as a pool or lake) for 24 hours.  Do not drive for 24 hours unless approved by your health  care provider.  Avoid strenuous activity for 24 hours.  Remove your bandages the morning after the procedure.   If the injection site is tender, applying an ice pack may relieve some tenderness. To do this:  Put ice in a bag.  Place a towel between your skin and the bag.  Leave the ice on for 15-20 minutes, 3-4 times a day.  Keep follow-up appointments as directed by your health care provider. SEEK MEDICAL CARE IF:   Your pain is not controlled by your medicines.   There is drainage from the injection site.   There is significant bleeding or swelling at the injection site.  You have diabetes and your blood sugar is above 180 mg/dL. SEEK IMMEDIATE MEDICAL CARE IF:   You develop a fever of 101F (38.3C) or greater.   You have worsening pain or swelling around the injection site.   You have red streaking around the injection site.   You develop severe pain that is not controlled by your medicines.   You develop a headache, stiff neck, nausea, or vomiting.   Your eyes become very sensitive to light.   You have weakness, paralysis, or tingling in your arms or legs that was not present before the procedure.   You develop difficulty urinating or breathing.    This information is not intended to replace advice given to you by your health care provider. Make sure you discuss any questions you have with your health care provider.   Document Released: 01/22/2012 Document Revised: 02/25/2014 Document Reviewed: 01/22/2012 Elsevier Interactive Patient Education 2016 [place] After Refer to this sheet in the next few weeks. These instructions provide you with information on caring for yourself after your procedure. Your health care provider may also give you more specific instructions. Your treatment has been planned according to current medical practices, but problems sometimes occur. Call your health care provider if you have any problems  or questions after your procedure. HOME CARE INSTRUCTIONS   Keep track of the amount of pain relief you feel and how long it lasts.  Limit pain medicine within the first 4-6 hours after the procedure as directed by your health care provider.  Resume taking dietary supplements and medicines as directed by your health care provider.  You may resume your regular diet.  Do not apply heat near or over the injection site(s) for 24 hours.   Do not take a bath or soak in water (such as a pool or lake) for 24 hours.  Do not drive for 24 hours unless  approved by your health care provider.  Avoid strenuous activity for 24 hours.  Remove your bandages the morning after the procedure.   If the injection site is tender, applying an ice pack may relieve some tenderness. To do this:  Put ice in a bag.  Place a towel between your skin and the bag.  Leave the ice on for 15-20 minutes, 3-4 times a day.  Keep follow-up appointments as directed by your health care provider. SEEK MEDICAL CARE IF:   Your pain is not controlled by your medicines.   There is drainage from the injection site.   There is significant bleeding or swelling at the injection site.  You have diabetes and your blood sugar is above 180 mg/dL. SEEK IMMEDIATE MEDICAL CARE IF:   You develop a fever of 101F (38.3C) or greater.   You have worsening pain or swelling around the injection site.   You have red streaking around the injection site.   You develop severe pain that is not controlled by your medicines.   You develop a headache, stiff neck, nausea, or vomiting.   Your eyes become very sensitive to light.   You have weakness, paralysis, or tingling in your arms or legs that was not present before the procedure.   You develop difficulty urinating or breathing.    This information is not intended to replace advice given to you by your health care provider. Make sure you discuss any questions you  have with your health care provider.   Document Released: 01/22/2012 Document Revised: 02/25/2014 Document Reviewed: 01/22/2012 Elsevier Interactive Patient Education [employer].
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
## 2015-10-25 ENCOUNTER — Telehealth: Payer: Self-pay | Admitting: *Deleted

## 2015-10-25 NOTE — Telephone Encounter (Signed)
No problems post procedure. 

## 2015-11-17 DIAGNOSIS — H6980 Other specified disorders of Eustachian tube, unspecified ear: Secondary | ICD-10-CM | POA: Diagnosis not present

## 2015-11-17 DIAGNOSIS — H9202 Otalgia, left ear: Secondary | ICD-10-CM | POA: Diagnosis not present

## 2015-11-17 DIAGNOSIS — H6121 Impacted cerumen, right ear: Secondary | ICD-10-CM | POA: Diagnosis not present

## 2015-11-17 DIAGNOSIS — R05 Cough: Secondary | ICD-10-CM | POA: Diagnosis not present

## 2015-11-17 DIAGNOSIS — Z87891 Personal history of nicotine dependence: Secondary | ICD-10-CM | POA: Diagnosis not present

## 2015-11-17 DIAGNOSIS — I10 Essential (primary) hypertension: Secondary | ICD-10-CM | POA: Diagnosis not present

## 2015-11-17 DIAGNOSIS — J3489 Other specified disorders of nose and nasal sinuses: Secondary | ICD-10-CM | POA: Diagnosis not present

## 2015-11-17 DIAGNOSIS — E785 Hyperlipidemia, unspecified: Secondary | ICD-10-CM | POA: Diagnosis not present

## 2015-11-17 DIAGNOSIS — H6990 Unspecified Eustachian tube disorder, unspecified ear: Secondary | ICD-10-CM | POA: Diagnosis not present

## 2015-11-23 ENCOUNTER — Ambulatory Visit: Payer: PPO | Admitting: Pain Medicine

## 2016-03-11 DIAGNOSIS — J00 Acute nasopharyngitis [common cold]: Secondary | ICD-10-CM | POA: Diagnosis not present

## 2016-03-16 ENCOUNTER — Emergency Department: Payer: PPO

## 2016-03-16 ENCOUNTER — Encounter: Payer: Self-pay | Admitting: Emergency Medicine

## 2016-03-16 ENCOUNTER — Emergency Department
Admission: EM | Admit: 2016-03-16 | Discharge: 2016-03-16 | Disposition: A | Discharge status: Home / Self Care | Payer: PPO | Attending: Student in an Organized Health Care Education/Training Program | Admitting: Student in an Organized Health Care Education/Training Program

## 2016-03-16 DIAGNOSIS — J3489 Other specified disorders of nose and nasal sinuses: Secondary | ICD-10-CM | POA: Diagnosis not present

## 2016-03-16 DIAGNOSIS — Z7982 Long term (current) use of aspirin: Secondary | ICD-10-CM | POA: Insufficient documentation

## 2016-03-16 DIAGNOSIS — R112 Nausea with vomiting, unspecified: Secondary | ICD-10-CM

## 2016-03-16 DIAGNOSIS — M791 Myalgia: Secondary | ICD-10-CM | POA: Diagnosis not present

## 2016-03-16 DIAGNOSIS — R61 Generalized hyperhidrosis: Secondary | ICD-10-CM | POA: Insufficient documentation

## 2016-03-16 DIAGNOSIS — J101 Influenza due to other identified influenza virus with other respiratory manifestations: Secondary | ICD-10-CM | POA: Diagnosis not present

## 2016-03-16 DIAGNOSIS — Z79899 Other long term (current) drug therapy: Secondary | ICD-10-CM | POA: Insufficient documentation

## 2016-03-16 DIAGNOSIS — R630 Anorexia: Secondary | ICD-10-CM | POA: Insufficient documentation

## 2016-03-16 DIAGNOSIS — Z87891 Personal history of nicotine dependence: Secondary | ICD-10-CM | POA: Diagnosis not present

## 2016-03-16 DIAGNOSIS — I10 Essential (primary) hypertension: Secondary | ICD-10-CM | POA: Insufficient documentation

## 2016-03-16 DIAGNOSIS — R05 Cough: Secondary | ICD-10-CM | POA: Diagnosis not present

## 2016-03-16 DIAGNOSIS — R0981 Nasal congestion: Secondary | ICD-10-CM | POA: Insufficient documentation

## 2016-03-16 DIAGNOSIS — J111 Influenza due to unidentified influenza virus with other respiratory manifestations: Secondary | ICD-10-CM | POA: Diagnosis not present

## 2016-03-16 DIAGNOSIS — E876 Hypokalemia: Secondary | ICD-10-CM | POA: Diagnosis not present

## 2016-03-16 DIAGNOSIS — R69 Illness, unspecified: Secondary | ICD-10-CM

## 2016-03-16 LAB — CBC WITH DIFFERENTIAL/PLATELET
BASOS PCT: 0 %
Basophils Absolute: 0 10*3/uL (ref 0–0.1)
Eosinophils Absolute: 0 10*3/uL (ref 0–0.7)
Eosinophils Relative: 0 %
HEMATOCRIT: 36.2 % (ref 35.0–47.0)
HEMOGLOBIN: 12.3 g/dL (ref 12.0–16.0)
LYMPHS ABS: 1.1 10*3/uL (ref 1.0–3.6)
Lymphocytes Relative: 28 %
MCH: 29.1 pg (ref 26.0–34.0)
MCHC: 33.9 g/dL (ref 32.0–36.0)
MCV: 85.8 fL (ref 80.0–100.0)
MONOS PCT: 7 %
Monocytes Absolute: 0.3 10*3/uL (ref 0.2–0.9)
NEUTROS ABS: 2.6 10*3/uL (ref 1.4–6.5)
NEUTROS PCT: 65 %
Platelets: 265 10*3/uL (ref 150–440)
RBC: 4.21 MIL/uL (ref 3.80–5.20)
RDW: 13.5 % (ref 11.5–14.5)
WBC: 4.1 10*3/uL (ref 3.6–11.0)

## 2016-03-16 LAB — BASIC METABOLIC PANEL
ANION GAP: 8 (ref 5–15)
BUN: 11 mg/dL (ref 6–20)
CALCIUM: 7.8 mg/dL — AB (ref 8.9–10.3)
CHLORIDE: 107 mmol/L (ref 101–111)
CO2: 25 mmol/L (ref 22–32)
Creatinine, Ser: 0.61 mg/dL (ref 0.44–1.00)
GFR calc non Af Amer: >60 mL/min (ref >60)
Glucose, Bld: 139 mg/dL — ABNORMAL HIGH (ref 65–99)
Potassium: 2.9 mmol/L — ABNORMAL LOW (ref 3.5–5.1)
Sodium: 140 mmol/L (ref 135–145)

## 2016-03-16 MED ORDER — POTASSIUM CHLORIDE CRYS ER 20 MEQ PO TBCR
40.0000 meq | EXTENDED_RELEASE_TABLET | Freq: Once | ORAL | Status: AC
  Administered 2016-03-16: 40 meq via ORAL
  Filled 2016-03-16: qty 2

## 2016-03-16 MED ORDER — ONDANSETRON HCL 4 MG/2ML IJ SOLN
4.0000 mg | Freq: Once | INTRAMUSCULAR | Status: AC
  Administered 2016-03-16: 4 mg via INTRAVENOUS
  Filled 2016-03-16: qty 2

## 2016-03-16 MED ORDER — ONDANSETRON 4 MG PO TBDP: 4.0000 mg | ORAL_TABLET | Freq: Three times a day (TID) | ORAL | 0 refills | Status: DC | PRN

## 2016-03-16 MED ORDER — POTASSIUM CHLORIDE ER 10 MEQ PO TBCR: 10.0000 meq | EXTENDED_RELEASE_TABLET | Freq: Every day | ORAL | 0 refills | Status: DC

## 2016-03-16 MED ORDER — SODIUM CHLORIDE 0.9 % IV BOLUS (SEPSIS)
1000.0000 mL | Freq: Once | INTRAVENOUS | Status: AC
  Administered 2016-03-16: 1000 mL via INTRAVENOUS
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
## 2016-03-16 NOTE — ED Triage Notes (Addendum)
Pt has had flu-like sxs for 2 weeks, was seen at an urgent care on Monday and diagnosed with a viral infection. Pt states she is not improving. Did not test for flu. Pt took Tylenol this morning around 10am and a coughing pill at 8am.  Pt was on Z-pack and was told to stop on Monday.  Pt c/o generalized body aches, cold chills and sweats and decreased appetite, but patient reports she has been drinking fluids throughout the week.Marland Kitchen

## 2016-03-16 NOTE — ED Notes (Signed)
Pt verbalized understanding of discharge instructions. NAD at this time. 

## 2016-03-16 NOTE — ED Notes (Signed)
Social work at bedside.  
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
## 2016-03-16 NOTE — Discharge Instructions (Signed)
Stay well hydrated.   Bland diet as discussed.  Please see your PCP on Monday for recheck.

## 2016-03-20 DIAGNOSIS — R509 Fever, unspecified: Secondary | ICD-10-CM | POA: Diagnosis not present

## 2016-03-20 DIAGNOSIS — R6889 Other general symptoms and signs: Secondary | ICD-10-CM | POA: Diagnosis not present

## 2016-03-27 ENCOUNTER — Encounter: Payer: PPO | Admitting: Pain Medicine

## 2016-03-28 ENCOUNTER — Ambulatory Visit: Payer: PPO | Admitting: Pain Medicine

## 2016-04-01 ENCOUNTER — Telehealth: Payer: Self-pay

## 2016-04-01 NOTE — Telephone Encounter (Signed)
Pt wants Dr Lowella Dandy to know she stopped her gabapentin. She wants Dr Lowella Dandy to know this because he referred her to somewhere for having hallucinations She said she was told gabapentin causes hallucinations.

## 2016-04-02 DIAGNOSIS — R61 Generalized hyperhidrosis: Secondary | ICD-10-CM | POA: Diagnosis not present

## 2016-04-02 DIAGNOSIS — R531 Weakness: Secondary | ICD-10-CM | POA: Diagnosis not present

## 2016-04-02 DIAGNOSIS — R739 Hyperglycemia, unspecified: Secondary | ICD-10-CM | POA: Diagnosis not present

## 2016-04-02 DIAGNOSIS — R6883 Chills (without fever): Secondary | ICD-10-CM | POA: Diagnosis not present

## 2016-04-02 DIAGNOSIS — E782 Mixed hyperlipidemia: Secondary | ICD-10-CM | POA: Diagnosis not present

## 2016-04-02 DIAGNOSIS — Z131 Encounter for screening for diabetes mellitus: Secondary | ICD-10-CM | POA: Diagnosis not present

## 2016-04-05 DIAGNOSIS — Z7982 Long term (current) use of aspirin: Secondary | ICD-10-CM | POA: Diagnosis not present

## 2016-04-05 DIAGNOSIS — R000 Tachycardia, unspecified: Secondary | ICD-10-CM | POA: Diagnosis not present

## 2016-04-05 DIAGNOSIS — R6883 Chills (without fever): Secondary | ICD-10-CM | POA: Diagnosis not present

## 2016-04-05 DIAGNOSIS — R110 Nausea: Secondary | ICD-10-CM | POA: Diagnosis not present

## 2016-04-05 DIAGNOSIS — F339 Major depressive disorder, recurrent, unspecified: Secondary | ICD-10-CM | POA: Diagnosis not present

## 2016-04-05 DIAGNOSIS — R0602 Shortness of breath: Secondary | ICD-10-CM | POA: Diagnosis not present

## 2016-04-05 DIAGNOSIS — F419 Anxiety disorder, unspecified: Secondary | ICD-10-CM | POA: Diagnosis not present

## 2016-04-05 DIAGNOSIS — I10 Essential (primary) hypertension: Secondary | ICD-10-CM | POA: Diagnosis not present

## 2016-04-05 DIAGNOSIS — Z87891 Personal history of nicotine dependence: Secondary | ICD-10-CM | POA: Diagnosis not present

## 2016-04-05 DIAGNOSIS — R0789 Other chest pain: Secondary | ICD-10-CM | POA: Diagnosis not present

## 2016-04-05 DIAGNOSIS — R9431 Abnormal electrocardiogram [ECG] [EKG]: Secondary | ICD-10-CM | POA: Diagnosis not present

## 2016-04-05 DIAGNOSIS — E785 Hyperlipidemia, unspecified: Secondary | ICD-10-CM | POA: Diagnosis not present

## 2016-04-05 DIAGNOSIS — Z8249 Family history of ischemic heart disease and other diseases of the circulatory system: Secondary | ICD-10-CM | POA: Diagnosis not present

## 2016-04-05 DIAGNOSIS — Z885 Allergy status to narcotic agent status: Secondary | ICD-10-CM | POA: Diagnosis not present

## 2016-04-17 DIAGNOSIS — Z7982 Long term (current) use of aspirin: Secondary | ICD-10-CM | POA: Diagnosis not present

## 2016-04-17 DIAGNOSIS — S0083XA Contusion of other part of head, initial encounter: Secondary | ICD-10-CM | POA: Diagnosis not present

## 2016-04-17 DIAGNOSIS — Z87891 Personal history of nicotine dependence: Secondary | ICD-10-CM | POA: Diagnosis not present

## 2016-04-17 DIAGNOSIS — Z885 Allergy status to narcotic agent status: Secondary | ICD-10-CM | POA: Diagnosis not present

## 2016-04-17 DIAGNOSIS — F339 Major depressive disorder, recurrent, unspecified: Secondary | ICD-10-CM | POA: Diagnosis not present

## 2016-04-17 DIAGNOSIS — E785 Hyperlipidemia, unspecified: Secondary | ICD-10-CM | POA: Diagnosis not present

## 2016-04-17 DIAGNOSIS — I10 Essential (primary) hypertension: Secondary | ICD-10-CM | POA: Diagnosis not present

## 2016-04-17 DIAGNOSIS — M1990 Unspecified osteoarthritis, unspecified site: Secondary | ICD-10-CM | POA: Diagnosis not present

## 2016-04-17 DIAGNOSIS — Z8249 Family history of ischemic heart disease and other diseases of the circulatory system: Secondary | ICD-10-CM | POA: Diagnosis not present

## 2016-04-17 DIAGNOSIS — S0990XA Unspecified injury of head, initial encounter: Secondary | ICD-10-CM | POA: Diagnosis not present

## 2016-04-17 DIAGNOSIS — H538 Other visual disturbances: Secondary | ICD-10-CM | POA: Diagnosis not present

## 2016-04-17 DIAGNOSIS — W19XXXA Unspecified fall, initial encounter: Secondary | ICD-10-CM | POA: Diagnosis not present

## 2016-04-17 DIAGNOSIS — R51 Headache: Secondary | ICD-10-CM | POA: Diagnosis not present

## 2016-04-17 DIAGNOSIS — Y92008 Other place in unspecified non-institutional (private) residence as the place of occurrence of the external cause: Secondary | ICD-10-CM | POA: Diagnosis not present

## 2016-04-17 DIAGNOSIS — S0003XA Contusion of scalp, initial encounter: Secondary | ICD-10-CM | POA: Diagnosis not present

## 2016-04-18 DIAGNOSIS — S0990XA Unspecified injury of head, initial encounter: Secondary | ICD-10-CM | POA: Diagnosis not present

## 2016-04-18 DIAGNOSIS — S0003XA Contusion of scalp, initial encounter: Secondary | ICD-10-CM | POA: Diagnosis not present

## 2016-05-10 DIAGNOSIS — R9431 Abnormal electrocardiogram [ECG] [EKG]: Secondary | ICD-10-CM | POA: Diagnosis not present

## 2016-05-10 DIAGNOSIS — Z885 Allergy status to narcotic agent status: Secondary | ICD-10-CM | POA: Diagnosis not present

## 2016-05-10 DIAGNOSIS — K210 Gastro-esophageal reflux disease with esophagitis: Secondary | ICD-10-CM | POA: Diagnosis not present

## 2016-05-10 DIAGNOSIS — G8929 Other chronic pain: Secondary | ICD-10-CM | POA: Diagnosis not present

## 2016-05-10 DIAGNOSIS — R000 Tachycardia, unspecified: Secondary | ICD-10-CM | POA: Diagnosis not present

## 2016-05-10 DIAGNOSIS — M545 Low back pain: Secondary | ICD-10-CM | POA: Diagnosis not present

## 2016-05-10 DIAGNOSIS — M1990 Unspecified osteoarthritis, unspecified site: Secondary | ICD-10-CM | POA: Diagnosis not present

## 2016-05-10 DIAGNOSIS — I10 Essential (primary) hypertension: Secondary | ICD-10-CM | POA: Diagnosis not present

## 2016-05-10 DIAGNOSIS — Z7982 Long term (current) use of aspirin: Secondary | ICD-10-CM | POA: Diagnosis not present

## 2016-05-10 DIAGNOSIS — E785 Hyperlipidemia, unspecified: Secondary | ICD-10-CM | POA: Diagnosis not present

## 2016-05-10 DIAGNOSIS — F339 Major depressive disorder, recurrent, unspecified: Secondary | ICD-10-CM | POA: Diagnosis not present

## 2016-05-10 DIAGNOSIS — Z87891 Personal history of nicotine dependence: Secondary | ICD-10-CM | POA: Diagnosis not present

## 2016-05-10 DIAGNOSIS — Z79899 Other long term (current) drug therapy: Secondary | ICD-10-CM | POA: Diagnosis not present

## 2016-05-11 DIAGNOSIS — Z87891 Personal history of nicotine dependence: Secondary | ICD-10-CM | POA: Diagnosis not present

## 2016-05-11 DIAGNOSIS — G8929 Other chronic pain: Secondary | ICD-10-CM | POA: Diagnosis not present

## 2016-05-11 DIAGNOSIS — F1123 Opioid dependence with withdrawal: Secondary | ICD-10-CM | POA: Diagnosis not present

## 2016-05-11 DIAGNOSIS — Z79899 Other long term (current) drug therapy: Secondary | ICD-10-CM | POA: Diagnosis not present

## 2016-05-11 DIAGNOSIS — E785 Hyperlipidemia, unspecified: Secondary | ICD-10-CM | POA: Diagnosis not present

## 2016-05-11 DIAGNOSIS — M1990 Unspecified osteoarthritis, unspecified site: Secondary | ICD-10-CM | POA: Diagnosis not present

## 2016-05-11 DIAGNOSIS — Z7951 Long term (current) use of inhaled steroids: Secondary | ICD-10-CM | POA: Diagnosis not present

## 2016-05-11 DIAGNOSIS — K644 Residual hemorrhoidal skin tags: Secondary | ICD-10-CM | POA: Diagnosis not present

## 2016-05-11 DIAGNOSIS — I10 Essential (primary) hypertension: Secondary | ICD-10-CM | POA: Diagnosis not present

## 2016-05-11 DIAGNOSIS — F329 Major depressive disorder, single episode, unspecified: Secondary | ICD-10-CM | POA: Diagnosis not present

## 2016-05-11 DIAGNOSIS — Z885 Allergy status to narcotic agent status: Secondary | ICD-10-CM | POA: Diagnosis not present

## 2016-05-11 DIAGNOSIS — Z8249 Family history of ischemic heart disease and other diseases of the circulatory system: Secondary | ICD-10-CM | POA: Diagnosis not present

## 2016-05-11 DIAGNOSIS — M549 Dorsalgia, unspecified: Secondary | ICD-10-CM | POA: Diagnosis not present

## 2016-05-11 DIAGNOSIS — Z7982 Long term (current) use of aspirin: Secondary | ICD-10-CM | POA: Diagnosis not present

## 2016-05-14 ENCOUNTER — Encounter: Payer: Self-pay | Admitting: Pain Medicine

## 2016-05-14 ENCOUNTER — Ambulatory Visit: Payer: PPO | Attending: Pain Medicine | Admitting: Pain Medicine

## 2016-05-14 VITALS — BP 128/75 | HR 102 | Temp 98.7°F | Resp 18 | Ht 60.0 in | Wt 117.0 lb

## 2016-05-14 DIAGNOSIS — R102 Pelvic and perineal pain: Secondary | ICD-10-CM | POA: Diagnosis not present

## 2016-05-14 DIAGNOSIS — M25551 Pain in right hip: Secondary | ICD-10-CM | POA: Insufficient documentation

## 2016-05-14 DIAGNOSIS — Z8249 Family history of ischemic heart disease and other diseases of the circulatory system: Secondary | ICD-10-CM | POA: Insufficient documentation

## 2016-05-14 DIAGNOSIS — I10 Essential (primary) hypertension: Secondary | ICD-10-CM | POA: Insufficient documentation

## 2016-05-14 DIAGNOSIS — M25552 Pain in left hip: Secondary | ICD-10-CM | POA: Insufficient documentation

## 2016-05-14 DIAGNOSIS — K219 Gastro-esophageal reflux disease without esophagitis: Secondary | ICD-10-CM | POA: Diagnosis not present

## 2016-05-14 DIAGNOSIS — H548 Legal blindness, as defined in USA: Secondary | ICD-10-CM | POA: Insufficient documentation

## 2016-05-14 DIAGNOSIS — M5412 Radiculopathy, cervical region: Secondary | ICD-10-CM | POA: Diagnosis not present

## 2016-05-14 DIAGNOSIS — Z90710 Acquired absence of both cervix and uterus: Secondary | ICD-10-CM | POA: Insufficient documentation

## 2016-05-14 DIAGNOSIS — H5316 Psychophysical visual disturbances: Secondary | ICD-10-CM

## 2016-05-14 DIAGNOSIS — K449 Diaphragmatic hernia without obstruction or gangrene: Secondary | ICD-10-CM | POA: Diagnosis not present

## 2016-05-14 DIAGNOSIS — G894 Chronic pain syndrome: Secondary | ICD-10-CM | POA: Diagnosis not present

## 2016-05-14 DIAGNOSIS — R441 Visual hallucinations: Secondary | ICD-10-CM | POA: Diagnosis not present

## 2016-05-14 DIAGNOSIS — M170 Bilateral primary osteoarthritis of knee: Secondary | ICD-10-CM | POA: Diagnosis not present

## 2016-05-14 DIAGNOSIS — Z79899 Other long term (current) drug therapy: Secondary | ICD-10-CM | POA: Diagnosis not present

## 2016-05-14 DIAGNOSIS — M791 Myalgia: Secondary | ICD-10-CM | POA: Diagnosis not present

## 2016-05-14 DIAGNOSIS — Z9889 Other specified postprocedural states: Secondary | ICD-10-CM | POA: Insufficient documentation

## 2016-05-14 DIAGNOSIS — Z7982 Long term (current) use of aspirin: Secondary | ICD-10-CM | POA: Diagnosis not present

## 2016-05-14 DIAGNOSIS — E7801 Familial hypercholesterolemia: Secondary | ICD-10-CM | POA: Insufficient documentation

## 2016-05-14 DIAGNOSIS — M5442 Lumbago with sciatica, left side: Secondary | ICD-10-CM

## 2016-05-14 DIAGNOSIS — Z79891 Long term (current) use of opiate analgesic: Secondary | ICD-10-CM

## 2016-05-14 DIAGNOSIS — K644 Residual hemorrhoidal skin tags: Secondary | ICD-10-CM | POA: Insufficient documentation

## 2016-05-14 DIAGNOSIS — F329 Major depressive disorder, single episode, unspecified: Secondary | ICD-10-CM | POA: Diagnosis not present

## 2016-05-14 DIAGNOSIS — G8929 Other chronic pain: Secondary | ICD-10-CM

## 2016-05-14 DIAGNOSIS — Z885 Allergy status to narcotic agent status: Secondary | ICD-10-CM | POA: Diagnosis not present

## 2016-05-14 DIAGNOSIS — F411 Generalized anxiety disorder: Secondary | ICD-10-CM | POA: Diagnosis not present

## 2016-05-14 DIAGNOSIS — G4700 Insomnia, unspecified: Secondary | ICD-10-CM | POA: Diagnosis not present

## 2016-05-14 DIAGNOSIS — M5441 Lumbago with sciatica, right side: Secondary | ICD-10-CM

## 2016-05-14 DIAGNOSIS — Z9049 Acquired absence of other specified parts of digestive tract: Secondary | ICD-10-CM | POA: Insufficient documentation

## 2016-05-14 DIAGNOSIS — M7918 Myalgia, other site: Secondary | ICD-10-CM

## 2016-05-14 DIAGNOSIS — M79604 Pain in right leg: Secondary | ICD-10-CM

## 2016-05-14 DIAGNOSIS — Z888 Allergy status to other drugs, medicaments and biological substances status: Secondary | ICD-10-CM | POA: Insufficient documentation

## 2016-05-14 DIAGNOSIS — M62830 Muscle spasm of back: Secondary | ICD-10-CM

## 2016-05-14 DIAGNOSIS — Z87891 Personal history of nicotine dependence: Secondary | ICD-10-CM | POA: Diagnosis not present

## 2016-05-14 DIAGNOSIS — M79605 Pain in left leg: Secondary | ICD-10-CM

## 2016-05-14 DIAGNOSIS — M47816 Spondylosis without myelopathy or radiculopathy, lumbar region: Secondary | ICD-10-CM

## 2016-05-14 DIAGNOSIS — Z809 Family history of malignant neoplasm, unspecified: Secondary | ICD-10-CM | POA: Insufficient documentation

## 2016-05-14 DIAGNOSIS — F1190 Opioid use, unspecified, uncomplicated: Secondary | ICD-10-CM

## 2016-05-14 DIAGNOSIS — Z833 Family history of diabetes mellitus: Secondary | ICD-10-CM | POA: Insufficient documentation

## 2016-05-14 MED ORDER — CYCLOBENZAPRINE HCL 10 MG PO TABS: 10.0000 mg | ORAL_TABLET | Freq: Two times a day (BID) | ORAL | 5 refills | Status: DC | PRN

## 2016-05-14 MED ORDER — TRAMADOL HCL 50 MG PO TABS: 100.0000 mg | ORAL_TABLET | Freq: Four times a day (QID) | ORAL | 5 refills | Status: DC | PRN

## 2016-05-14 NOTE — Progress Notes (Signed)
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    Preparing for Procedure with Sedation Instructions: . Oral Intake: Do not eat or drink anything for at least 8 hours prior to your procedure. . Transportation: Public transportation is not allowed. Bring an adult driver. The driver must be physically present in our waiting room before any procedure can be started. Marland Kitchen Physical Assistance: Bring an adult capable of physically assisting you, in the event you need help. . Blood Pressure Medicine: Take your blood pressure medicine with a sip of water the morning of the procedure. . Insulin: Take only  of your normal insulin dose. . Preventing infections: Shower with an antibacterial soap the morning of your procedure. . Build-up your immune system: Take 1000 mg of Vitamin C with every meal (3 times a day) the day prior to your procedure. . Pregnancy: If you are pregnant, call and cancel the procedure. . Sickness: If you have a cold, fever, or any active  infections, call and cancel the procedure. . Arrival: You must be in the facility at least 30 minutes prior to your scheduled procedure. . Children: Do not bring children with you. . Dress appropriately: Bring dark clothing that you would not mind if they get stained. . Valuables: Do not bring any jewelry or valuables. Procedure appointments are reserved for interventional treatments only. Marland Kitchen  No Prescription Refills. . No medication changes will be discussed during procedure appointments. No disability issues will be discussed.Facet Blocks Patient Information  Description: The facets are joints in the spine between the vertebrae.  Like any joints in the body, facets can become irritated and painful.  Arthritis can also effect the facets.  By injecting steroids and local anesthetic in and around these joints, we can temporarily block the nerve supply to them.  Steroids act directly on irritated nerves and tissues to reduce selling and inflammation which often leads to decreased pain.  Facet blocks may be done anywhere along the spine from the neck to the low back depending upon the location of your pain.   After numbing the skin with local anesthetic (like Novocaine), a small needle is passed onto the facet joints under x-ray guidance.  You may experience a sensation of pressure while this is being done.  The entire block usually lasts about 15-25 minutes.   Conditions which may be treated by facet blocks:  Low back/buttock pain Neck/shoulder pain Certain types of headaches  Preparation for the injection:  Do not eat any solid food or dairy products within 8 hours of your appointment. You may drink clear liquid up to 3 hours before appointment.  Clear liquids include water, black coffee, juice or soda.  No milk or cream please. You may take your regular medication, including pain medications, with a sip of water before your appointment.  Diabetics should hold regular insulin (if taken  separately) and take 1/2 normal NPH dose the morning of the procedure.  Carry some sugar containing items with you to your appointment. A driver must accompany you and be prepared to drive you home after your procedure. Bring all your current medications with you. An IV may be inserted and sedation may be given at the discretion of the physician. A blood pressure cuff, EKG and other monitors will often be applied during the procedure.  Some patients may need to have extra oxygen administered for a short period. You will be asked to provide medical information, including your allergies and medications, prior to the procedure.  We must know immediately if you are taking blood thinners (like Coumadin/Warfarin) or if you are allergic to IV iodine contrast (dye).  We must know if you could possible be pregnant.  Possible side-effects:  Bleeding from needle site Infection (rare, may require surgery) Nerve injury (rare) Numbness & tingling (temporary) Difficulty urinating (rare, temporary) Spinal headache (a headache worse with upright posture) Light-headedness (temporary) Pain at injection site (serveral days) Decreased blood pressure (rare, temporary) Weakness in arm/leg (temporary) Pressure sensation in back/neck (temporary)   Call if you experience:  Fever/chills associated with headache or increased back/neck pain Headache worsened by an upright position New onset, weakness or numbness of an extremity below the injection site Hives or difficulty breathing (go to the emergency room) Inflammation or drainage at the injection site(s) Severe back/neck pain greater than usual New symptoms which are concerning to you  Please note:  Although the local anesthetic injected can often make your back or neck feel good for several hours after the injection, the pain will likely return. It takes 3-7 days for steroids to work.  You may not notice any pain relief for at least one week.  If  effective, we will often do a series of 2-3 injections spaced 3-6 weeks apart to maximally decrease your pain.  After the initial series, you may be a candidate for a more permanent  nerve block of the facets.  If you have any questions, please call [phone] . [facility]

## 2016-05-14 NOTE — Progress Notes (Signed)
Nursing Pain Medication Assessment:  Safety precautions to be maintained throughout the outpatient stay will include: orient to surroundings, keep bed in low position, maintain call bell within reach at all times, provide assistance with transfer out of bed and ambulation.  Medication Inspection Compliance: Sandra [name] did not comply with our request to bring her pills to be counted. She was reminded that bringing the medication bottles, even when empty, is a requirement. Pill/Patch Count: None available to be counted. Bottle Appearance: No container available. Did not bring bottle(s) to appointment. Medication: None brought in. Filled Date: N/A Last Medication intake:  Ran out of medicine more than 48 hours ago

## 2016-05-14 NOTE — Patient Instructions (Signed)
Patient takes taxi.  Informed that she would have to have a responsible adult to travel with her in the taxi.  She states understanding and teach back 3 done.    Preparing for Procedure with Sedation Instructions: . Oral Intake: Do not eat or drink anything for at least 8 hours prior to your procedure. . Transportation: Public transportation is not allowed. Bring an adult driver. The driver must be physically present in our waiting room before any procedure can be started. Marland Kitchen Physical Assistance: Bring an adult capable of physically assisting you, in the event you need help. . Blood Pressure Medicine: Take your blood pressure medicine with a sip of water the morning of the procedure. . Insulin: Take only  of your normal insulin dose. . Preventing infections: Shower with an antibacterial soap the morning of your procedure. . Build-up your immune system: Take 1000 mg of Vitamin C with every meal (3 times a day) the day prior to your procedure. . Pregnancy: If you are pregnant, call and cancel the procedure. . Sickness: If you have a cold, fever, or any active infections, call and cancel the procedure. . Arrival: You must be in the facility at least 30 minutes prior to your scheduled procedure. . Children: Do not bring children with you. . Dress appropriately: Bring dark clothing that you would not mind if they get stained. . Valuables: Do not bring any jewelry or valuables. Procedure appointments are reserved for interventional treatments only. Marland Kitchen No Prescription Refills. . No medication changes will be discussed during procedure appointments. No disability issues will be discussed.Facet Blocks Patient Information  Description: The facets are joints in the spine between the vertebrae.  Like any joints in the body, facets can become irritated and painful.  Arthritis can also effect the facets.  By injecting steroids and local anesthetic in and around these joints, we can temporarily block the  nerve supply to them.  Steroids act directly on irritated nerves and tissues to reduce selling and inflammation which often leads to decreased pain.  Facet blocks may be done anywhere along the spine from the neck to the low back depending upon the location of your pain.   After numbing the skin with local anesthetic (like Novocaine), a small needle is passed onto the facet joints under x-ray guidance.  You may experience a sensation of pressure while this is being done.  The entire block usually lasts about 15-25 minutes.   Conditions which may be treated by facet blocks:  Low back/buttock pain Neck/shoulder pain Certain types of headaches  Preparation for the injection:  Do not eat any solid food or dairy products within 8 hours of your appointment. You may drink clear liquid up to 3 hours before appointment.  Clear liquids include water, black coffee, juice or soda.  No milk or cream please. You may take your regular medication, including pain medications, with a sip of water before your appointment.  Diabetics should hold regular insulin (if taken separately) and take 1/2 normal NPH dose the morning of the procedure.  Carry some sugar containing items with you to your appointment. A driver must accompany you and be prepared to drive you home after your procedure. Bring all your current medications with you. An IV may be inserted and sedation may be given at the discretion of the physician. A blood pressure cuff, EKG and other monitors will often be applied during the procedure.  Some patients may need to have extra oxygen administered for a short  period. You will be asked to provide medical information, including your allergies and medications, prior to the procedure.  We must know immediately if you are taking blood thinners (like Coumadin/Warfarin) or if you are allergic to IV iodine contrast (dye).  We must know if you could possible be pregnant.  Possible side-effects:  Bleeding from  needle site Infection (rare, may require surgery) Nerve injury (rare) Numbness & tingling (temporary) Difficulty urinating (rare, temporary) Spinal headache (a headache worse with upright posture) Light-headedness (temporary) Pain at injection site (serveral days) Decreased blood pressure (rare, temporary) Weakness in arm/leg (temporary) Pressure sensation in back/neck (temporary)   Call if you experience:  Fever/chills associated with headache or increased back/neck pain Headache worsened by an upright position New onset, weakness or numbness of an extremity below the injection site Hives or difficulty breathing (go to the emergency room) Inflammation or drainage at the injection site(s) Severe back/neck pain greater than usual New symptoms which are concerning to you  Please note:  Although the local anesthetic injected can often make your back or neck feel good for several hours after the injection, the pain will likely return. It takes 3-7 days for steroids to work.  You may not notice any pain relief for at least one week.  If effective, we will often do a series of 2-3 injections spaced 3-6 weeks apart to maximally decrease your pain.  After the initial series, you may be a candidate for a more permanent nerve block of the facets.  If you have any questions, please call #[phone] . [facility]

## 2016-05-16 LAB — TOXASSURE SELECT 13 (MW), URINE

## 2016-05-29 ENCOUNTER — Telehealth: Payer: Self-pay | Admitting: *Deleted

## 2016-06-03 ENCOUNTER — Ambulatory Visit: Payer: PPO | Admitting: Pain Medicine

## 2016-06-08 DIAGNOSIS — R5383 Other fatigue: Secondary | ICD-10-CM | POA: Diagnosis not present

## 2016-06-08 DIAGNOSIS — F331 Major depressive disorder, recurrent, moderate: Secondary | ICD-10-CM | POA: Diagnosis not present

## 2016-06-08 DIAGNOSIS — K219 Gastro-esophageal reflux disease without esophagitis: Secondary | ICD-10-CM | POA: Diagnosis not present

## 2016-06-08 DIAGNOSIS — I10 Essential (primary) hypertension: Secondary | ICD-10-CM | POA: Diagnosis not present

## 2016-06-08 DIAGNOSIS — Z90710 Acquired absence of both cervix and uterus: Secondary | ICD-10-CM | POA: Diagnosis not present

## 2016-06-08 DIAGNOSIS — K0889 Other specified disorders of teeth and supporting structures: Secondary | ICD-10-CM | POA: Diagnosis not present

## 2016-06-08 DIAGNOSIS — Z87891 Personal history of nicotine dependence: Secondary | ICD-10-CM | POA: Diagnosis not present

## 2016-06-08 DIAGNOSIS — K649 Unspecified hemorrhoids: Secondary | ICD-10-CM | POA: Diagnosis not present

## 2016-06-08 DIAGNOSIS — R05 Cough: Secondary | ICD-10-CM | POA: Diagnosis not present

## 2016-06-08 DIAGNOSIS — E785 Hyperlipidemia, unspecified: Secondary | ICD-10-CM | POA: Diagnosis not present

## 2016-06-08 DIAGNOSIS — K210 Gastro-esophageal reflux disease with esophagitis: Secondary | ICD-10-CM | POA: Diagnosis not present

## 2016-06-08 DIAGNOSIS — K2960 Other gastritis without bleeding: Secondary | ICD-10-CM | POA: Diagnosis not present

## 2016-06-08 DIAGNOSIS — R531 Weakness: Secondary | ICD-10-CM | POA: Diagnosis not present

## 2016-07-04 DIAGNOSIS — K649 Unspecified hemorrhoids: Secondary | ICD-10-CM | POA: Diagnosis not present

## 2016-07-04 DIAGNOSIS — K644 Residual hemorrhoidal skin tags: Secondary | ICD-10-CM | POA: Diagnosis not present

## 2016-08-26 DIAGNOSIS — K649 Unspecified hemorrhoids: Secondary | ICD-10-CM | POA: Diagnosis not present

## 2016-08-26 DIAGNOSIS — N816 Rectocele: Secondary | ICD-10-CM | POA: Diagnosis not present

## 2016-09-18 DIAGNOSIS — E7800 Pure hypercholesterolemia, unspecified: Secondary | ICD-10-CM | POA: Diagnosis not present

## 2016-09-18 DIAGNOSIS — G8929 Other chronic pain: Secondary | ICD-10-CM | POA: Diagnosis not present

## 2016-09-18 DIAGNOSIS — K219 Gastro-esophageal reflux disease without esophagitis: Secondary | ICD-10-CM | POA: Diagnosis not present

## 2016-09-18 DIAGNOSIS — F5104 Psychophysiologic insomnia: Secondary | ICD-10-CM | POA: Diagnosis not present

## 2016-09-18 DIAGNOSIS — M545 Low back pain: Secondary | ICD-10-CM | POA: Diagnosis not present

## 2016-09-18 DIAGNOSIS — R110 Nausea: Secondary | ICD-10-CM | POA: Diagnosis not present

## 2016-11-19 DIAGNOSIS — Z23 Encounter for immunization: Secondary | ICD-10-CM | POA: Diagnosis not present

## 2016-11-19 DIAGNOSIS — Z780 Asymptomatic menopausal state: Secondary | ICD-10-CM | POA: Diagnosis not present

## 2016-11-19 DIAGNOSIS — R739 Hyperglycemia, unspecified: Secondary | ICD-10-CM | POA: Diagnosis not present

## 2016-11-19 DIAGNOSIS — F321 Major depressive disorder, single episode, moderate: Secondary | ICD-10-CM | POA: Diagnosis not present

## 2016-11-19 DIAGNOSIS — Z1231 Encounter for screening mammogram for malignant neoplasm of breast: Secondary | ICD-10-CM | POA: Diagnosis not present

## 2016-11-19 DIAGNOSIS — Z0000 Encounter for general adult medical examination without abnormal findings: Secondary | ICD-10-CM | POA: Diagnosis not present

## 2016-11-19 DIAGNOSIS — R110 Nausea: Secondary | ICD-10-CM | POA: Diagnosis not present

## 2016-11-19 DIAGNOSIS — J3089 Other allergic rhinitis: Secondary | ICD-10-CM | POA: Diagnosis not present

## 2016-11-19 DIAGNOSIS — K648 Other hemorrhoids: Secondary | ICD-10-CM | POA: Diagnosis not present

## 2016-11-26 DIAGNOSIS — M810 Age-related osteoporosis without current pathological fracture: Secondary | ICD-10-CM | POA: Diagnosis not present

## 2016-11-27 ENCOUNTER — Other Ambulatory Visit: Payer: Self-pay | Admitting: Internal Medicine

## 2016-11-27 DIAGNOSIS — Z1239 Encounter for other screening for malignant neoplasm of breast: Secondary | ICD-10-CM

## 2016-11-29 DIAGNOSIS — K644 Residual hemorrhoidal skin tags: Secondary | ICD-10-CM | POA: Diagnosis not present

## 2016-11-29 DIAGNOSIS — K5902 Outlet dysfunction constipation: Secondary | ICD-10-CM | POA: Diagnosis not present

## 2016-11-29 DIAGNOSIS — K602 Anal fissure, unspecified: Secondary | ICD-10-CM | POA: Diagnosis not present

## 2016-12-20 ENCOUNTER — Ambulatory Visit
Admission: RE | Admit: 2016-12-20 | Discharge: 2016-12-20 | Disposition: A | Discharge status: Home / Self Care | Payer: PPO | Source: Ambulatory Visit | Attending: Internal Medicine | Admitting: Internal Medicine

## 2016-12-20 DIAGNOSIS — Z1239 Encounter for other screening for malignant neoplasm of breast: Secondary | ICD-10-CM

## 2016-12-20 DIAGNOSIS — Z1231 Encounter for screening mammogram for malignant neoplasm of breast: Secondary | ICD-10-CM | POA: Insufficient documentation

## 2017-01-02 DIAGNOSIS — K602 Anal fissure, unspecified: Secondary | ICD-10-CM | POA: Diagnosis not present

## 2017-01-16 ENCOUNTER — Encounter
Admission: RE | Admit: 2017-01-16 | Discharge: 2017-01-16 | Disposition: A | Discharge status: Home / Self Care | Payer: PPO | Source: Ambulatory Visit | Attending: Surgery | Admitting: Surgery

## 2017-01-16 ENCOUNTER — Other Ambulatory Visit: Payer: PPO

## 2017-01-16 ENCOUNTER — Other Ambulatory Visit: Payer: Self-pay

## 2017-01-16 DIAGNOSIS — M5412 Radiculopathy, cervical region: Secondary | ICD-10-CM | POA: Diagnosis not present

## 2017-01-16 DIAGNOSIS — M5416 Radiculopathy, lumbar region: Secondary | ICD-10-CM | POA: Diagnosis not present

## 2017-01-16 DIAGNOSIS — Z01810 Encounter for preprocedural cardiovascular examination: Secondary | ICD-10-CM | POA: Insufficient documentation

## 2017-01-16 DIAGNOSIS — M62830 Muscle spasm of back: Secondary | ICD-10-CM | POA: Insufficient documentation

## 2017-01-16 DIAGNOSIS — I10 Essential (primary) hypertension: Secondary | ICD-10-CM | POA: Diagnosis not present

## 2017-01-16 DIAGNOSIS — Z01812 Encounter for preprocedural laboratory examination: Secondary | ICD-10-CM | POA: Insufficient documentation

## 2017-01-16 DIAGNOSIS — G894 Chronic pain syndrome: Secondary | ICD-10-CM | POA: Diagnosis not present

## 2017-01-16 DIAGNOSIS — F39 Unspecified mood [affective] disorder: Secondary | ICD-10-CM | POA: Diagnosis not present

## 2017-01-16 DIAGNOSIS — R102 Pelvic and perineal pain: Secondary | ICD-10-CM | POA: Insufficient documentation

## 2017-01-16 DIAGNOSIS — M792 Neuralgia and neuritis, unspecified: Secondary | ICD-10-CM | POA: Diagnosis not present

## 2017-01-16 DIAGNOSIS — R079 Chest pain, unspecified: Secondary | ICD-10-CM | POA: Diagnosis not present

## 2017-01-16 DIAGNOSIS — F419 Anxiety disorder, unspecified: Secondary | ICD-10-CM | POA: Diagnosis not present

## 2017-01-16 LAB — BASIC METABOLIC PANEL
ANION GAP: 12 (ref 5–15)
BUN: 18 mg/dL (ref 6–20)
CHLORIDE: 101 mmol/L (ref 101–111)
CO2: 25 mmol/L (ref 22–32)
Calcium: 9.4 mg/dL (ref 8.9–10.3)
Creatinine, Ser: 0.75 mg/dL (ref 0.44–1.00)
Glucose, Bld: 80 mg/dL (ref 65–99)
POTASSIUM: 4.2 mmol/L (ref 3.5–5.1)
SODIUM: 138 mmol/L (ref 135–145)

## 2017-01-16 LAB — CBC
HCT: 39.6 % (ref 35.0–47.0)
HEMOGLOBIN: 13.1 g/dL (ref 12.0–16.0)
MCH: 28.9 pg (ref 26.0–34.0)
MCHC: 33.2 g/dL (ref 32.0–36.0)
MCV: 87.1 fL (ref 80.0–100.0)
PLATELETS: 300 10*3/uL (ref 150–440)
RBC: 4.55 MIL/uL (ref 3.80–5.20)
RDW: 13.9 % (ref 11.5–14.5)
WBC: 7.1 10*3/uL (ref 3.6–11.0)

## 2017-01-16 NOTE — Patient Instructions (Signed)
Your procedure is scheduled on: Friday, January 24, 2017 Report to Same Day Surgery on the 2nd floor in the [facility]. To find out your arrival time, please call [phone] between 1PM - 3PM on: Thursday, January 23, 2017  REMEMBER: Instructions that are not followed completely may result in serious medical risk, up to and including death; or upon the discretion of your surgeon and anesthesiologist your surgery may need to be rescheduled.  Do not eat food after midnight the night before your procedure.  No gum chewing or hard candies.  You may however, drink CLEAR liquids up to 2 hours before you are scheduled to arrive at the hospital for your procedure.  Do not drink clear liquids within 2 hours of the start of your surgery.  Clear liquids include: - water  - apple juice without pulp - clear gatorade - black coffee or tea (Do NOT add anything to the coffee or tea) Do NOT drink anything that is not on this list.  No Alcohol for 24 hours before or after surgery.  No Smoking including e-cigarettes for 24 hours prior to surgery. No chewable tobacco products for at least 6 hours prior to surgery. No nicotine patches on the day of surgery.  Notify your doctor if there is any change in your medical condition (cold, fever, infection).  Do not wear jewelry, make-up, hairpins, clips or nail polish.  Do not wear lotions, powders, or perfumes. You may wear deodorant.  Do not shave 48 hours prior to surgery.   Contacts and dentures may not be worn into surgery.  Do not bring valuables to the hospital. [facility] is not responsible for any belongings or valuables.   TAKE THESE MEDICATIONS THE MORNING OF SURGERY WITH A SIP OF WATER:  1.  Proair (albuterol) inhaler 2.  Xanax (alprazolam) if needed for anxiety 3.  Neurontin (gabapentin) 4.  Prevacid (lansoprazole) 5.  Tramadol if needed for pain  Use CHG Soap or wipes as directed on instruction sheet.  NOW!  Stop ASPIRIN AND  Anti-inflammatories such as Advil, Aleve, Ibuprofen, Motrin, Naproxen, Naprosyn, Goodie powder, or aspirin products. (May take Tylenol or Acetaminophen if needed.)  NOW!  Stop ANY OVER THE COUNTER supplements until after surgery.   If you are being discharged the day of surgery, you will not be allowed to drive home. You will need someone to drive you home and stay with you that night.   If you are taking public transportation, you will need to have a responsible adult to with you.  Please call the number above if you have any questions about these instructions.

## 2017-01-24 ENCOUNTER — Ambulatory Visit
Admission: RE | Admit: 2017-01-24 | Discharge: 2017-01-24 | Disposition: A | Discharge status: Home / Self Care | Payer: PPO | Source: Ambulatory Visit | Attending: Surgery | Admitting: Surgery

## 2017-01-24 ENCOUNTER — Ambulatory Visit: Payer: PPO | Admitting: Certified Registered Nurse Anesthetist

## 2017-01-24 ENCOUNTER — Encounter: Payer: Self-pay | Admitting: *Deleted

## 2017-01-24 ENCOUNTER — Encounter
Admission: RE | Disposition: A | Discharge status: Home / Self Care | Payer: Self-pay | Source: Ambulatory Visit | Attending: Surgery

## 2017-01-24 DIAGNOSIS — K602 Anal fissure, unspecified: Secondary | ICD-10-CM | POA: Diagnosis not present

## 2017-01-24 DIAGNOSIS — Z79899 Other long term (current) drug therapy: Secondary | ICD-10-CM | POA: Insufficient documentation

## 2017-01-24 DIAGNOSIS — Z87891 Personal history of nicotine dependence: Secondary | ICD-10-CM | POA: Insufficient documentation

## 2017-01-24 DIAGNOSIS — E785 Hyperlipidemia, unspecified: Secondary | ICD-10-CM | POA: Diagnosis not present

## 2017-01-24 DIAGNOSIS — I110 Hypertensive heart disease with heart failure: Secondary | ICD-10-CM | POA: Diagnosis not present

## 2017-01-24 DIAGNOSIS — F418 Other specified anxiety disorders: Secondary | ICD-10-CM | POA: Diagnosis not present

## 2017-01-24 DIAGNOSIS — Z7951 Long term (current) use of inhaled steroids: Secondary | ICD-10-CM | POA: Insufficient documentation

## 2017-01-24 DIAGNOSIS — Z7982 Long term (current) use of aspirin: Secondary | ICD-10-CM | POA: Diagnosis not present

## 2017-01-24 DIAGNOSIS — I10 Essential (primary) hypertension: Secondary | ICD-10-CM | POA: Diagnosis not present

## 2017-01-24 HISTORY — PX: SPHINCTEROTOMY: SHX5279

## 2017-01-24 SURGERY — SPHINCTEROTOMY, ANAL
Anesthesia: General

## 2017-01-24 MED ORDER — TRAMADOL HCL 50 MG PO TABS: 100.0000 mg | ORAL_TABLET | Freq: Four times a day (QID) | ORAL | 0 refills | Status: DC | PRN

## 2017-01-24 MED ORDER — LACTATED RINGERS IV SOLN
INTRAVENOUS | Status: DC
  Administered 2017-01-24: 10:00:00 via INTRAVENOUS

## 2017-01-24 MED ORDER — TRAMADOL HCL 50 MG PO TABS
ORAL_TABLET | ORAL | Status: AC
  Filled 2017-01-24: qty 2

## 2017-01-24 MED ORDER — DEXAMETHASONE SODIUM PHOSPHATE 10 MG/ML IJ SOLN
INTRAMUSCULAR | Status: AC
  Filled 2017-01-24: qty 1

## 2017-01-24 MED ORDER — ONDANSETRON HCL 4 MG/2ML IJ SOLN
INTRAMUSCULAR | Status: DC | PRN
  Administered 2017-01-24: 4 mg via INTRAVENOUS

## 2017-01-24 MED ORDER — BUPIVACAINE-EPINEPHRINE (PF) 0.5% -1:200000 IJ SOLN
INTRAMUSCULAR | Status: DC | PRN
  Administered 2017-01-24: 5 mL

## 2017-01-24 MED ORDER — ONDANSETRON HCL 4 MG/2ML IJ SOLN
INTRAMUSCULAR | Status: AC
  Filled 2017-01-24: qty 2

## 2017-01-24 MED ORDER — FENTANYL CITRATE (PF) 100 MCG/2ML IJ SOLN
INTRAMUSCULAR | Status: DC | PRN
  Administered 2017-01-24: 50 ug via INTRAVENOUS
  Administered 2017-01-24: 25 ug via INTRAVENOUS

## 2017-01-24 MED ORDER — FENTANYL CITRATE (PF) 100 MCG/2ML IJ SOLN
INTRAMUSCULAR | Status: AC
  Administered 2017-01-24: 25 ug via INTRAVENOUS
  Filled 2017-01-24: qty 2

## 2017-01-24 MED ORDER — PROPOFOL 10 MG/ML IV BOLUS
INTRAVENOUS | Status: DC | PRN
  Administered 2017-01-24: 40 mg via INTRAVENOUS
  Administered 2017-01-24: 100 mg via INTRAVENOUS

## 2017-01-24 MED ORDER — BUPIVACAINE-EPINEPHRINE (PF) 0.5% -1:200000 IJ SOLN
INTRAMUSCULAR | Status: AC
  Filled 2017-01-24: qty 30

## 2017-01-24 MED ORDER — LIDOCAINE HCL (CARDIAC) 20 MG/ML IV SOLN
INTRAVENOUS | Status: DC | PRN
  Administered 2017-01-24: 40 mg via INTRAVENOUS

## 2017-01-24 MED ORDER — PHENYLEPHRINE HCL 10 MG/ML IJ SOLN
INTRAMUSCULAR | Status: DC | PRN
  Administered 2017-01-24 (×3): 200 ug via INTRAVENOUS

## 2017-01-24 MED ORDER — FENTANYL CITRATE (PF) 250 MCG/5ML IJ SOLN
INTRAMUSCULAR | Status: AC
  Filled 2017-01-24: qty 5

## 2017-01-24 MED ORDER — TRAMADOL HCL 50 MG PO TABS
100.0000 mg | ORAL_TABLET | Freq: Once | ORAL | Status: AC
  Administered 2017-01-24: 100 mg via ORAL

## 2017-01-24 MED ORDER — PROPOFOL 10 MG/ML IV BOLUS
INTRAVENOUS | Status: AC
  Filled 2017-01-24: qty 20

## 2017-01-24 MED ORDER — FENTANYL CITRATE (PF) 100 MCG/2ML IJ SOLN
25.0000 ug | INTRAMUSCULAR | Status: AC | PRN
  Administered 2017-01-24 (×6): 25 ug via INTRAVENOUS

## 2017-01-24 MED ORDER — EPHEDRINE SULFATE 50 MG/ML IJ SOLN
INTRAMUSCULAR | Status: DC | PRN
  Administered 2017-01-24: 15 mg via INTRAVENOUS
  Administered 2017-01-24: 25 mg via INTRAVENOUS

## 2017-01-24 MED ORDER — MIDAZOLAM HCL 2 MG/2ML IJ SOLN
INTRAMUSCULAR | Status: DC | PRN
  Administered 2017-01-24: 2 mg via INTRAVENOUS

## 2017-01-24 MED ORDER — ONDANSETRON HCL 4 MG/2ML IJ SOLN: 4.0000 mg | Freq: Once | INTRAMUSCULAR | Status: DC | PRN

## 2017-01-24 MED ORDER — MIDAZOLAM HCL 2 MG/2ML IJ SOLN
INTRAMUSCULAR | Status: AC
  Filled 2017-01-24: qty 2

## 2017-01-24 MED ORDER — DEXAMETHASONE SODIUM PHOSPHATE 10 MG/ML IJ SOLN
INTRAMUSCULAR | Status: DC | PRN
  Administered 2017-01-24: 10 mg via INTRAVENOUS

## 2017-01-24 MED ORDER — LIDOCAINE HCL (PF) 2 % IJ SOLN
INTRAMUSCULAR | Status: AC
  Filled 2017-01-24: qty 10

## 2017-01-24 SURGICAL SUPPLY — 25 items
BLADE SURG 15 STRL LF DISP TIS (BLADE) ×1 IMPLANT
BLADE SURG 15 STRL SS (BLADE) ×2
CANISTER SUCT 1200ML W/VALVE (MISCELLANEOUS) ×3 IMPLANT
DRAPE LAPAROTOMY 100X77 ABD (DRAPES) ×3 IMPLANT
DRAPE LEGGINS SURG 28X43 STRL (DRAPES) ×3 IMPLANT
ELECT REM PT RETURN 9FT ADLT (ELECTROSURGICAL) ×3
ELECTRODE REM PT RTRN 9FT ADLT (ELECTROSURGICAL) ×1 IMPLANT
GAUZE SPONGE 4X4 12PLY STRL (GAUZE/BANDAGES/DRESSINGS) ×3 IMPLANT
GLOVE BIO SURGEON STRL SZ7.5 (GLOVE) ×3 IMPLANT
GOWN STRL REUS W/ TWL LRG LVL3 (GOWN DISPOSABLE) ×2 IMPLANT
GOWN STRL REUS W/TWL LRG LVL3 (GOWN DISPOSABLE) ×4
KIT RM TURNOVER CYSTO AR (KITS) ×3 IMPLANT
LABEL OR SOLS (LABEL) ×3 IMPLANT
NEEDLE HYPO 25X1 1.5 SAFETY (NEEDLE) ×3 IMPLANT
NS IRRIG 500ML POUR BTL (IV SOLUTION) ×3 IMPLANT
PACK BASIN MINOR ARMC (MISCELLANEOUS) ×3 IMPLANT
PAD PREP 24X41 OB/GYN DISP (PERSONAL CARE ITEMS) ×3 IMPLANT
SOL PREP PVP 2OZ (MISCELLANEOUS) ×3
SOLUTION PREP PVP 2OZ (MISCELLANEOUS) ×1 IMPLANT
SURGILUBE 2OZ TUBE FLIPTOP (MISCELLANEOUS) ×3 IMPLANT
SUT CHROMIC 3 0 SH 27 (SUTURE) ×3 IMPLANT
SUT CHROMIC 5 0 RB 1 27 (SUTURE) ×6 IMPLANT
SUT VIC AB 3-0 SH 27 (SUTURE) ×2
SUT VIC AB 3-0 SH 27X BRD (SUTURE) ×1 IMPLANT
SYR 10ML LL (SYRINGE) ×3 IMPLANT

## 2017-01-24 NOTE — Op Note (Signed)
OPERATIVE REPORT  PREOPERATIVE  DIAGNOSIS: .  Posterior anal fissure  POSTOPERATIVE DIAGNOSIS: .  Posterior anal fissure  PROCEDURE: .  Lateral internal anal sphincterotomy  ANESTHESIA:  General  SURGEON: Rochel Brome  MD   INDICATIONS: .  She has history of pain with bowel movements.  She had physical findings of posterior anal fissure.  Application of nifedipine has not helped.  Surgery was recommended for definitive treatment.  With the patient on the operating table in the supine position under general anesthesia the legs were elevated into the lithotomy position using ankle straps.  The perineum was prepared with Betadine and draped in a sterile manner.  There were a number of external hemorrhoids identified which did not appear to be inflamed or edematous.  The anoderm was retracted to expose a posterior anal fissure.  The anoderm on the patient's left side at the 3 o'clock position was infiltrated with 0.5% Sensorcaine with epinephrine.  The anal canal was dilated large enough to admit 5 fingers.  The bivalve anal retractor was introduced.  Examination of the anal canal and distal rectum demonstrated no polyp or tumor and no blood.  The fissure was further identified.  An incision was made at the 3 o'clock position approximately 12 mm in length.  Dissection was carried into the tissues with electrocautery.  Next hemostats were used to bluntly dissected and exposed the internal anal sphincter.  90% of the sphincter was divided with electrocautery.  Hemostasis was intact.  The wound was closed with interrupted 4-0 chromic simple sutures leaving a small opening at the distal end for drainage.  Dressings were applied with paper tape.  The patient tolerated the procedure satisfactorily and was then prepared for transfer to the recovery room  Rochel Brome MD

## 2017-01-24 NOTE — Anesthesia Preprocedure Evaluation (Signed)
Anesthesia Evaluation  Patient identified by MRN, date of birth, ID band Patient awake    Reviewed: Allergy & Precautions, H&P , NPO status , Patient's Chart, lab work & pertinent test results, reviewed documented beta blocker date and time   Airway Mallampati: II  TM Distance: >3 FB Neck ROM: full    Dental  (+) Teeth Intact   Pulmonary neg pulmonary ROS, former smoker,    Pulmonary exam normal        Cardiovascular Exercise Tolerance: Poor hypertension, On Medications negative cardio ROS Normal cardiovascular exam Rate:Normal     Neuro/Psych  Neuromuscular disease negative neurological ROS  negative psych ROS   GI/Hepatic negative GI ROS, Neg liver ROS, hiatal hernia, PUD, GERD  Medicated,(+) Hepatitis -  Endo/Other  negative endocrine ROS  Renal/GU negative Renal ROS  negative genitourinary   Musculoskeletal   Abdominal   Peds  Hematology negative hematology ROS (+)   Anesthesia Other Findings   Reproductive/Obstetrics negative OB ROS                             Anesthesia Physical Anesthesia Plan  ASA: III  Anesthesia Plan: General LMA   Post-op Pain Management:    Induction:   PONV Risk Score and Plan:   Airway Management Planned:   Additional Equipment:   Intra-op Plan:   Post-operative Plan:   Informed Consent: I have reviewed the patients History and Physical, chart, labs and discussed the procedure including the risks, benefits and alternatives for the proposed anesthesia with the patient or authorized representative who has indicated his/her understanding and acceptance.     Plan Discussed with: CRNA  Anesthesia Plan Comments:         Anesthesia Quick Evaluation

## 2017-01-24 NOTE — OR Nursing (Signed)
Dr Tamala Julian in to speak with pt before discharge. Discharge instructions discussed with pt and friend. Both voice understanding.

## 2017-01-24 NOTE — Anesthesia Postprocedure Evaluation (Signed)
Anesthesia Post Note  Patient: Sandra [name]  Procedure(s) Performed: SPHINCTEROTOMY (N/A )  Anesthesia Type: General     Last Vitals:  Vitals:   01/24/17 1206 01/24/17 1211  BP: 108/61   Pulse: 84 85  Resp: 11 10  Temp:    SpO2: 99% 100%    Last Pain:  Vitals:   01/24/17 1211  TempSrc:   PainSc: [place]

## 2017-01-24 NOTE — Anesthesia Post-op Follow-up Note (Signed)
Anesthesia QCDR form completed.        

## 2017-01-24 NOTE — Discharge Instructions (Addendum)
Take Tylenol and/or tramadol if needed for pain.  Remove dressing later today or tomorrow.  May shower and/or sit in warm water.  Tuck gauze or a pad in underwear and change as needed for drainage..  Discontinue application of nifedipine.  AMBULATORY SURGERY  DISCHARGE INSTRUCTIONS   1) The drugs that you were given will stay in your system until tomorrow so for the next 24 hours you should not:  A) Drive an automobile B) Make any legal decisions C) Drink any alcoholic beverage   2) You may resume regular meals tomorrow.  Today it is better to start with liquids and gradually work up to solid foods.  You may eat anything you prefer, but it is better to start with liquids, then soup and crackers, and gradually work up to solid foods.   3) Please notify your doctor immediately if you have any unusual bleeding, trouble breathing, redness and pain at the surgery site, drainage, fever, or pain not relieved by medication.    4) Additional Instructions:        Please contact your physician with any problems or Same Day Surgery at [phone], Monday through Friday 6 am to 4 pm, or [place] at [facility] number at [phone].

## 2017-01-24 NOTE — Consult Note (Signed)
She comes prepared for lateral internal anal sphincterotomy.  She reports no change in condition since office exam.  Labs noted.   Discussed plan for surgery.

## 2017-01-24 NOTE — Anesthesia Procedure Notes (Signed)
Procedure Name: LMA Insertion Date/Time: 01/24/2017 10:52 AM Performed by: Eben Burow, CRNA Pre-anesthesia Checklist: Patient identified, Emergency Drugs available, Suction available, Patient being monitored and Timeout performed Patient Re-evaluated:Patient Re-evaluated prior to induction Oxygen Delivery Method: Circle system utilized Preoxygenation: Pre-oxygenation with 100% oxygen Induction Type: IV induction LMA: LMA inserted LMA Size: 3.5 Number of attempts: 1 Placement Confirmation: positive ETCO2 and breath sounds checked- equal and bilateral Tube secured with: Tape Dental Injury: Teeth and Oropharynx as per pre-operative assessment

## 2017-01-24 NOTE — Transfer of Care (Signed)
Immediate Anesthesia Transfer of Care Note  Patient: Sandra [name]  Procedure(s) Performed: SPHINCTEROTOMY (N/A )  Patient Location: PACU  Anesthesia Type:General  Level of Consciousness: drowsy  Airway & Oxygen Therapy: Patient Spontanous Breathing and Patient connected to nasal cannula oxygen  Post-op Assessment: Report given to RN and Post -op Vital signs reviewed and stable  Post vital signs: Reviewed and stable  Last Vitals:  Vitals:   01/24/17 1000 01/24/17 1136  BP: 128/75 (!) 105/55  Pulse: 88 83  Resp: 16 15  Temp: 36.9 C 36.6 C  SpO2: 98% 99%    Last Pain:  Vitals:   01/24/17 1136  TempSrc:   PainSc: Asleep         Complications: No apparent anesthesia complications

## 2017-02-20 DIAGNOSIS — K644 Residual hemorrhoidal skin tags: Secondary | ICD-10-CM | POA: Diagnosis not present

## 2017-02-20 DIAGNOSIS — M79672 Pain in left foot: Secondary | ICD-10-CM | POA: Diagnosis not present

## 2017-02-20 DIAGNOSIS — R739 Hyperglycemia, unspecified: Secondary | ICD-10-CM | POA: Diagnosis not present

## 2017-02-20 DIAGNOSIS — K648 Other hemorrhoids: Secondary | ICD-10-CM | POA: Diagnosis not present

## 2017-02-20 DIAGNOSIS — F321 Major depressive disorder, single episode, moderate: Secondary | ICD-10-CM | POA: Diagnosis not present

## 2017-02-20 DIAGNOSIS — E7800 Pure hypercholesterolemia, unspecified: Secondary | ICD-10-CM | POA: Diagnosis not present

## 2017-02-20 DIAGNOSIS — I10 Essential (primary) hypertension: Secondary | ICD-10-CM | POA: Diagnosis not present

## 2017-02-26 DIAGNOSIS — M810 Age-related osteoporosis without current pathological fracture: Secondary | ICD-10-CM | POA: Diagnosis not present

## 2017-02-26 DIAGNOSIS — I10 Essential (primary) hypertension: Secondary | ICD-10-CM | POA: Diagnosis not present

## 2017-02-26 DIAGNOSIS — K219 Gastro-esophageal reflux disease without esophagitis: Secondary | ICD-10-CM | POA: Diagnosis not present

## 2017-02-26 DIAGNOSIS — L97521 Non-pressure chronic ulcer of other part of left foot limited to breakdown of skin: Secondary | ICD-10-CM | POA: Diagnosis not present

## 2017-02-26 DIAGNOSIS — E7800 Pure hypercholesterolemia, unspecified: Secondary | ICD-10-CM | POA: Diagnosis not present

## 2017-02-27 ENCOUNTER — Other Ambulatory Visit: Payer: Self-pay

## 2017-02-27 ENCOUNTER — Encounter
Admission: RE | Admit: 2017-02-27 | Discharge: 2017-02-27 | Disposition: A | Discharge status: Home / Self Care | Payer: PPO | Source: Ambulatory Visit | Attending: Surgery | Admitting: Surgery

## 2017-02-27 HISTORY — DX: Abnormal levels of other serum enzymes: R74.8

## 2017-02-27 NOTE — Patient Instructions (Signed)
Your procedure is scheduled on: 03-07-17 Report to Same Day Surgery 2nd floor medical mall [facility] Entrance-take elevator on left to 2nd floor.  Check in with surgery information desk.) To find out your arrival time please call [phone] between 1PM - 3PM on 03-06-17  Remember: Instructions that are not followed completely may result in serious medical risk, up to and including death, or upon the discretion of your surgeon and anesthesiologist your surgery may need to be rescheduled.    _x___ 1. Do not eat food after midnight the night before your procedure. NO GUM OR CANDY AFTER MIDNIGHT.  You may drink clear liquids up to 2 hours before you are scheduled to arrive at the hospital for your procedure.  Do not drink clear liquids within 2 hours of your scheduled arrival to the hospital.  Clear liquids include  --Water or Apple juice without pulp  --Clear carbohydrate beverage such as ClearFast or Gatorade  --Black Coffee or Clear Tea (No milk, no creamers, do not add anything to the coffee or Tea      __x__ 2. No Alcohol for 24 hours before or after surgery.   __x__3. No Smoking for 24 prior to surgery.   ____  4. Bring all medications with you on the day of surgery if instructed.    __x__ 5. Notify your doctor if there is any change in your medical condition     (cold, fever, infections).     Do not wear jewelry, make-up, hairpins, clips or nail polish.  Do not wear lotions, powders, or perfumes. You may wear deodorant.  Do not shave 48 hours prior to surgery. Men may shave face and neck.  Do not bring valuables to the hospital.    [facility] is not responsible for any belongings or valuables.               Contacts, dentures or bridgework may not be worn into surgery.  Leave your suitcase in the car. After surgery it may be brought to your room.  For patients admitted to the hospital, discharge time is determined by your treatment team.   Patients discharged the day of  surgery will not be allowed to drive home.  You will need someone to drive you home and stay with you the night of your procedure.       _x___ TAKE THE FOLLOWING MEDICATION THE MORNING OF SURGERY WITH A SMALL SIP OF WATER. These include:  1. GABAPENTIN   2. PREVACID  3. MAY TAKE XANAX IF NEEDED AM OF SURGERY  4. MAY TAKE TRAMADOL IF NEEDED AM OF SURGERY  5.  6.  ____Fleets enema or Magnesium Citrate as directed.   ____ Use CHG Soap or sage wipes as directed on instruction sheet   _X___ Use inhalers on the day of surgery and bring to hospital day of surgery-USE ALBUTEROL INHALER AT [place]  ____ Stop Metformin and Janumet 2 days prior to surgery.    ____ Take 1/2 of usual insulin dose the night before surgery and none on the morning surgery.   _x___ Follow recommendations from Cardiologist, Pulmonologist or PCP regarding stopping Aspirin, Coumadin, Plavix ,Eliquis, Effient, or Pradaxa, and Pletal-PT STOPPED ASA LAST WEEK  X____Stop Anti-inflammatories such as Advil, Aleve, Ibuprofen, Motrin, Naproxen, Naprosyn, Goodies powders or aspirin products NOW-OK to take Tylenol    _x___ Stop supplements until after surgery-STOP MELATONIN ON 02-28-17-MAY RESUME AFTER SURGERY   ____ Bring C-Pap to the hospital.

## 2017-03-07 ENCOUNTER — Other Ambulatory Visit: Payer: Self-pay

## 2017-03-07 ENCOUNTER — Ambulatory Visit: Payer: PPO | Admitting: Certified Registered Nurse Anesthetist

## 2017-03-07 ENCOUNTER — Ambulatory Visit
Admission: RE | Admit: 2017-03-07 | Discharge: 2017-03-07 | Disposition: A | Discharge status: Home / Self Care | Payer: PPO | Source: Ambulatory Visit | Attending: Surgery | Admitting: Surgery

## 2017-03-07 ENCOUNTER — Encounter
Admission: RE | Disposition: A | Discharge status: Home / Self Care | Payer: Self-pay | Source: Ambulatory Visit | Attending: Surgery

## 2017-03-07 ENCOUNTER — Encounter: Payer: Self-pay | Admitting: Anesthesiology

## 2017-03-07 DIAGNOSIS — H548 Legal blindness, as defined in USA: Secondary | ICD-10-CM | POA: Diagnosis not present

## 2017-03-07 DIAGNOSIS — K649 Unspecified hemorrhoids: Secondary | ICD-10-CM | POA: Diagnosis not present

## 2017-03-07 DIAGNOSIS — K6289 Other specified diseases of anus and rectum: Secondary | ICD-10-CM | POA: Diagnosis present

## 2017-03-07 DIAGNOSIS — K219 Gastro-esophageal reflux disease without esophagitis: Secondary | ICD-10-CM | POA: Diagnosis not present

## 2017-03-07 DIAGNOSIS — Z87891 Personal history of nicotine dependence: Secondary | ICD-10-CM | POA: Diagnosis not present

## 2017-03-07 DIAGNOSIS — K648 Other hemorrhoids: Secondary | ICD-10-CM | POA: Insufficient documentation

## 2017-03-07 DIAGNOSIS — Z79899 Other long term (current) drug therapy: Secondary | ICD-10-CM | POA: Insufficient documentation

## 2017-03-07 DIAGNOSIS — K644 Residual hemorrhoidal skin tags: Secondary | ICD-10-CM | POA: Insufficient documentation

## 2017-03-07 DIAGNOSIS — F329 Major depressive disorder, single episode, unspecified: Secondary | ICD-10-CM | POA: Diagnosis not present

## 2017-03-07 DIAGNOSIS — Z7951 Long term (current) use of inhaled steroids: Secondary | ICD-10-CM | POA: Diagnosis not present

## 2017-03-07 DIAGNOSIS — I10 Essential (primary) hypertension: Secondary | ICD-10-CM | POA: Insufficient documentation

## 2017-03-07 DIAGNOSIS — F419 Anxiety disorder, unspecified: Secondary | ICD-10-CM | POA: Insufficient documentation

## 2017-03-07 HISTORY — PX: HEMORRHOID SURGERY: SHX153

## 2017-03-07 SURGERY — HEMORRHOIDECTOMY
Anesthesia: General | Wound class: Clean Contaminated

## 2017-03-07 MED ORDER — ONDANSETRON HCL 4 MG/2ML IJ SOLN: 4.0000 mg | Freq: Once | INTRAMUSCULAR | Status: DC | PRN

## 2017-03-07 MED ORDER — FENTANYL CITRATE (PF) 100 MCG/2ML IJ SOLN
25.0000 ug | INTRAMUSCULAR | Status: DC | PRN
  Administered 2017-03-07 (×4): 25 ug via INTRAVENOUS

## 2017-03-07 MED ORDER — LACTATED RINGERS IV SOLN
INTRAVENOUS | Status: DC
  Administered 2017-03-07: 09:00:00 via INTRAVENOUS

## 2017-03-07 MED ORDER — BUPIVACAINE LIPOSOME 1.3 % IJ SUSP
INTRAMUSCULAR | Status: AC
  Filled 2017-03-07: qty 20

## 2017-03-07 MED ORDER — ACETAMINOPHEN 10 MG/ML IV SOLN
INTRAVENOUS | Status: DC | PRN
  Administered 2017-03-07: 1000 mg via INTRAVENOUS

## 2017-03-07 MED ORDER — FENTANYL CITRATE (PF) 100 MCG/2ML IJ SOLN
INTRAMUSCULAR | Status: AC
  Administered 2017-03-07: 25 ug via INTRAVENOUS
  Filled 2017-03-07: qty 2

## 2017-03-07 MED ORDER — ONDANSETRON HCL 4 MG/2ML IJ SOLN
INTRAMUSCULAR | Status: DC | PRN
  Administered 2017-03-07: 4 mg via INTRAVENOUS

## 2017-03-07 MED ORDER — PROPOFOL 10 MG/ML IV BOLUS
INTRAVENOUS | Status: AC
  Filled 2017-03-07: qty 20

## 2017-03-07 MED ORDER — MIDAZOLAM HCL 2 MG/2ML IJ SOLN
INTRAMUSCULAR | Status: AC
  Filled 2017-03-07: qty 2

## 2017-03-07 MED ORDER — SODIUM CHLORIDE 0.9 % IJ SOLN
INTRAMUSCULAR | Status: AC
  Filled 2017-03-07: qty 50

## 2017-03-07 MED ORDER — LIDOCAINE HCL (PF) 2 % IJ SOLN
INTRAMUSCULAR | Status: AC
  Filled 2017-03-07: qty 10

## 2017-03-07 MED ORDER — DEXAMETHASONE SODIUM PHOSPHATE 10 MG/ML IJ SOLN
INTRAMUSCULAR | Status: AC
  Filled 2017-03-07: qty 1

## 2017-03-07 MED ORDER — LIDOCAINE HCL (CARDIAC) 20 MG/ML IV SOLN
INTRAVENOUS | Status: DC | PRN
  Administered 2017-03-07: 80 mg via INTRAVENOUS

## 2017-03-07 MED ORDER — PROPOFOL 10 MG/ML IV BOLUS
INTRAVENOUS | Status: DC | PRN
  Administered 2017-03-07: 120 mg via INTRAVENOUS

## 2017-03-07 MED ORDER — TRAMADOL HCL 50 MG PO TABS: 100.0000 mg | ORAL_TABLET | Freq: Four times a day (QID) | ORAL | 0 refills | Status: DC | PRN

## 2017-03-07 MED ORDER — SODIUM CHLORIDE FLUSH 0.9 % IV SOLN
INTRAVENOUS | Status: AC
  Filled 2017-03-07: qty 10

## 2017-03-07 MED ORDER — FENTANYL CITRATE (PF) 100 MCG/2ML IJ SOLN
INTRAMUSCULAR | Status: AC
  Filled 2017-03-07: qty 2

## 2017-03-07 MED ORDER — PHENYLEPHRINE HCL 10 MG/ML IJ SOLN
INTRAMUSCULAR | Status: DC | PRN
  Administered 2017-03-07: 100 ug via INTRAVENOUS

## 2017-03-07 MED ORDER — GLYCOPYRROLATE 0.2 MG/ML IJ SOLN
INTRAMUSCULAR | Status: AC
  Filled 2017-03-07: qty 1

## 2017-03-07 MED ORDER — ONDANSETRON HCL 4 MG/2ML IJ SOLN
INTRAMUSCULAR | Status: AC
  Filled 2017-03-07: qty 2

## 2017-03-07 MED ORDER — DEXAMETHASONE SODIUM PHOSPHATE 10 MG/ML IJ SOLN
INTRAMUSCULAR | Status: DC | PRN
  Administered 2017-03-07: 5 mg via INTRAVENOUS

## 2017-03-07 MED ORDER — ACETAMINOPHEN 10 MG/ML IV SOLN
INTRAVENOUS | Status: AC
  Filled 2017-03-07: qty 100

## 2017-03-07 MED ORDER — FENTANYL CITRATE (PF) 100 MCG/2ML IJ SOLN
INTRAMUSCULAR | Status: DC | PRN
  Administered 2017-03-07: 25 ug via INTRAVENOUS
  Administered 2017-03-07: 50 ug via INTRAVENOUS
  Administered 2017-03-07: 25 ug via INTRAVENOUS

## 2017-03-07 MED ORDER — MIDAZOLAM HCL 2 MG/2ML IJ SOLN
INTRAMUSCULAR | Status: DC | PRN
  Administered 2017-03-07: 1 mg via INTRAVENOUS

## 2017-03-07 MED ORDER — BUPIVACAINE-EPINEPHRINE (PF) 0.5% -1:200000 IJ SOLN
INTRAMUSCULAR | Status: AC
  Filled 2017-03-07: qty 30

## 2017-03-07 SURGICAL SUPPLY — 30 items
BLADE SURG 15 STRL LF DISP TIS (BLADE) ×1 IMPLANT
BLADE SURG 15 STRL SS (BLADE) ×2
CANISTER SUCT 1200ML W/VALVE (MISCELLANEOUS) ×3 IMPLANT
DRAPE LAPAROTOMY 100X77 ABD (DRAPES) ×3 IMPLANT
DRAPE LEGGINS SURG 28X43 STRL (DRAPES) ×3 IMPLANT
DRAPE UNDER BUTTOCK W/FLU (DRAPES) ×3 IMPLANT
ELECT REM PT RETURN 9FT ADLT (ELECTROSURGICAL) ×3
ELECTRODE REM PT RTRN 9FT ADLT (ELECTROSURGICAL) ×1 IMPLANT
GAUZE SPONGE 4X4 12PLY STRL (GAUZE/BANDAGES/DRESSINGS) ×3 IMPLANT
GLOVE BIO SURGEON STRL SZ7.5 (GLOVE) ×3 IMPLANT
GOWN STRL REUS W/ TWL LRG LVL3 (GOWN DISPOSABLE) ×3 IMPLANT
GOWN STRL REUS W/TWL LRG LVL3 (GOWN DISPOSABLE) ×6
LABEL OR SOLS (LABEL) IMPLANT
NEEDLE HYPO 25X1 1.5 SAFETY (NEEDLE) ×3 IMPLANT
NS IRRIG 500ML POUR BTL (IV SOLUTION) ×3 IMPLANT
PACK BASIN MINOR ARMC (MISCELLANEOUS) ×3 IMPLANT
PAD ABD DERMACEA PRESS 5X9 (GAUZE/BANDAGES/DRESSINGS) IMPLANT
PAD PREP 24X41 OB/GYN DISP (PERSONAL CARE ITEMS) ×3 IMPLANT
SHEARS HARMONIC 9CM CVD (BLADE) IMPLANT
SOL PREP PVP 2OZ (MISCELLANEOUS) ×3
SOLUTION PREP PVP 2OZ (MISCELLANEOUS) ×1 IMPLANT
STAPLER PROXIMATE HCS (STAPLE) IMPLANT
STRAP SAFETY BODY (MISCELLANEOUS) IMPLANT
SURGILUBE 2OZ TUBE FLIPTOP (MISCELLANEOUS) ×3 IMPLANT
SUT CHROMIC 2 0 SH (SUTURE) IMPLANT
SUT CHROMIC 3 0 SH 27 (SUTURE) ×3 IMPLANT
SUT ETHILON 3-0 FS-10 30 BLK (SUTURE) ×3
SUT PROLENE 3 0 PS 2 (SUTURE) IMPLANT
SUTURE EHLN 3-0 FS-10 30 BLK (SUTURE) ×1 IMPLANT
SYR 10ML LL (SYRINGE) ×3 IMPLANT

## 2017-03-07 NOTE — OR Nursing (Signed)
Does not need to be see by MD in MD prior to discharge.

## 2017-03-07 NOTE — Anesthesia Preprocedure Evaluation (Signed)
Anesthesia Evaluation  Patient identified by MRN, date of birth, ID band Patient awake    Reviewed: Allergy & Precautions, NPO status , Patient's Chart, lab work & pertinent test results, reviewed documented beta blocker date and time   Airway Mallampati: II  TM Distance: >3 FB     Dental  (+) Chipped   Pulmonary former smoker,           Cardiovascular hypertension, Pt. on medications      Neuro/Psych PSYCHIATRIC DISORDERS Anxiety Depression  Neuromuscular disease    GI/Hepatic hiatal hernia, PUD, GERD  Controlled,(+) Hepatitis -, B  Endo/Other    Renal/GU      Musculoskeletal  (+) Arthritis ,   Abdominal   Peds  Hematology   Anesthesia Other Findings   Reproductive/Obstetrics                             Anesthesia Physical Anesthesia Plan  ASA: III  Anesthesia Plan: General   Post-op Pain Management:    Induction: Intravenous  PONV Risk Score and Plan:   Airway Management Planned: LMA  Additional Equipment:   Intra-op Plan:   Post-operative Plan:   Informed Consent: I have reviewed the patients History and Physical, chart, labs and discussed the procedure including the risks, benefits and alternatives for the proposed anesthesia with the patient or authorized representative who has indicated his/her understanding and acceptance.     Plan Discussed with: CRNA  Anesthesia Plan Comments:         Anesthesia Quick Evaluation

## 2017-03-07 NOTE — Discharge Instructions (Addendum)
Take Tylenol and/or tramadol if needed for pain.  Remove dressing later today or tomorrow.  May then shower and/or sit in warm water.  Tuck gauze or a pad in underwear and change as needed for drainage.  Take Miralax each day until moving bowels well.   AMBULATORY SURGERY  DISCHARGE INSTRUCTIONS   1) The drugs that you were given will stay in your system until tomorrow so for the next 24 hours you should not:  A) Drive an automobile B) Make any legal decisions C) Drink any alcoholic beverage   2) You may resume regular meals tomorrow.  Today it is better to start with liquids and gradually work up to solid foods.  You may eat anything you prefer, but it is better to start with liquids, then soup and crackers, and gradually work up to solid foods.   3) Please notify your doctor immediately if you have any unusual bleeding, trouble breathing, redness and pain at the surgery site, drainage, fever, or pain not relieved by medication.    4) Additional Instructions: TAKE A STOOL SOFTENER TWICE A DAY WHILE TAKING NARCOTIC PAIN MEDICINE TO PREVENT CONSTIPATION   Please contact your physician with any problems or Same Day Surgery at [phone], Monday through Friday 6 am to 4 pm, or [place] at [facility] number at [phone].

## 2017-03-07 NOTE — Anesthesia Procedure Notes (Signed)
Procedure Name: LMA Insertion Date/Time: 03/07/2017 9:43 AM Performed by: Johnna Acosta, CRNA Pre-anesthesia Checklist: Patient identified, Emergency Drugs available, Suction available, Patient being monitored and Timeout performed Patient Re-evaluated:Patient Re-evaluated prior to induction Oxygen Delivery Method: Circle system utilized Preoxygenation: Pre-oxygenation with 100% oxygen Induction Type: IV induction LMA: LMA inserted LMA Size: 3.0 Tube type: Oral Number of attempts: 1 Placement Confirmation: positive ETCO2 and breath sounds checked- equal and bilateral Tube secured with: Tape Dental Injury: Teeth and Oropharynx as per pre-operative assessment

## 2017-03-07 NOTE — Transfer of Care (Signed)
Immediate Anesthesia Transfer of Care Note  Patient: Sandra [name]  Procedure(s) Performed: HEMORRHOIDECTOMY (N/A )  Patient Location: PACU  Anesthesia Type:General  Level of Consciousness: sedated  Airway & Oxygen Therapy: Patient Spontanous Breathing and Patient connected to face mask oxygen  Post-op Assessment: Report given to RN and Post -op Vital signs reviewed and stable  Post vital signs: Reviewed and stable  Last Vitals:  Vitals:   03/07/17 0824 03/07/17 1103  BP: (!) 147/74 123/61  Pulse: 74 71  Resp: 16 15  Temp: 37 C 36.5 C  SpO2: 100% 100%    Last Pain:  Vitals:   03/07/17 0824  TempSrc: Oral  PainSc: 5       Patients Stated Pain Goal: 2 (41/28/20 8138)  Complications: No apparent anesthesia complications

## 2017-03-07 NOTE — Anesthesia Post-op Follow-up Note (Signed)
Anesthesia QCDR form completed.        

## 2017-03-07 NOTE — H&P (Signed)
  She has a history of anal pain and prolapse and physical exam findings of large external hemorrhoid associated with large internal hemorrhoid.  The large external hemorrhoid is anterior.  She reports no change in overall condition since the office visit.  I discussed with her the plan for hemorrhoidectomy.  Also discussed plan for postoperative care to include MiraLAX Tylenol and tramadol

## 2017-03-07 NOTE — Op Note (Signed)
OPERATIVE REPORT  PREOPERATIVE  DIAGNOSIS: .  Internal and external hemorrhoids  POSTOPERATIVE DIAGNOSIS: .  Internal and external hemorrhoids  PROCEDURE: .  Internal and external hemorrhoidectomy  ANESTHESIA:  General  SURGEON: Rochel Brome  MD   INDICATIONS: .  She has history of anal pain and prolapse and physical findings of internal and external hemorrhoids.  Surgery was recommended for definitive treatment.  With the patient on the operating table in the supine position under general anesthesia the legs were elevated into the lithotomy position using ankle straps.  The perineum was prepared with Betadine solution and draped with sterile towels and sheets.  Initial inspection revealed hemorrhoids at the 11:00 8:00 and 4:00 positions.  The anoderm was infiltrated with 0.5% Sensorcaine with epinephrine.  The anal canal was dilated large enough to admit 4 fingers.  The bivalve anal retractor was introduced.  No polyps or tumors were seen in the distal rectum or anal canal.  The largest hemorrhoid at the 11 o'clock position was removed first.  A high ligation of the internal hemorrhoid with done with a 3-0 chromic suture ligature.  A V-shaped incision was scored in the skin externally.  The hemorrhoidectomy was carried out with electrocautery.  The internal anal sphincter was identified and left intact.  The hemorrhoid was further ligated with the same 3-0 chromic and amputated.  The wound was inspected and could see hemostasis was intact.  The wound was closed with a running locked tied 3-0 chromic suture leaving a small opening externally for drainage.  A similar procedure was carried out at the 8 o'clock position placing a 3-0 chromic suture at the upper extent of the internal hemorrhoid.  A V-shaped incision was scored in the skin externally and the dissection ligation and closure were carried out in a similar manner.  At the 4 o'clock position the excision was done without the anoscope and  excised in a external hemorrhoid and portion of internal hemorrhoid with electrocautery for hemostasis.  The wound was closed with a running 3-0 chromic stitch leaving a small opening externally for drainage.  Hemorrhoids which were removed were submitted in formalin for routine pathology.  The perineum was further prepared with Betadine and infiltrated the anoderm and tissues surrounding the sphincter with Exparel.  Rotate  The patient tolerated surgery satisfactorily and was then transferred to the recovery room for postoperative care  Rochel Brome MD

## 2017-03-07 NOTE — Anesthesia Postprocedure Evaluation (Signed)
Anesthesia Post Note  Patient: Sandra [name]  Procedure(s) Performed: HEMORRHOIDECTOMY (N/A )  Patient location during evaluation: PACU Anesthesia Type: General Level of consciousness: awake and alert Pain management: pain level controlled Vital Signs Assessment: post-procedure vital signs reviewed and stable Respiratory status: spontaneous breathing, nonlabored ventilation, respiratory function stable and patient connected to nasal cannula oxygen Cardiovascular status: blood pressure returned to baseline and stable Postop Assessment: no apparent nausea or vomiting Anesthetic complications: no     Last Vitals:  Vitals:   03/07/17 1247 03/07/17 1300  BP: (!) 124/56 (!) 141/66  Pulse: 73 71  Resp: 16 14  Temp:    SpO2: 100% 100%    Last Pain:  Vitals:   03/07/17 1300  TempSrc:   PainSc: [place]

## 2017-03-10 LAB — SURGICAL PATHOLOGY

## 2017-03-27 DIAGNOSIS — L97521 Non-pressure chronic ulcer of other part of left foot limited to breakdown of skin: Secondary | ICD-10-CM | POA: Diagnosis not present

## 2017-05-20 DIAGNOSIS — K602 Anal fissure, unspecified: Secondary | ICD-10-CM | POA: Diagnosis not present

## 2017-05-20 DIAGNOSIS — I10 Essential (primary) hypertension: Secondary | ICD-10-CM | POA: Diagnosis not present

## 2017-06-03 DIAGNOSIS — L819 Disorder of pigmentation, unspecified: Secondary | ICD-10-CM | POA: Diagnosis not present

## 2017-06-03 DIAGNOSIS — F418 Other specified anxiety disorders: Secondary | ICD-10-CM | POA: Diagnosis not present

## 2017-06-03 DIAGNOSIS — G8929 Other chronic pain: Secondary | ICD-10-CM | POA: Diagnosis not present

## 2017-06-03 DIAGNOSIS — R740 Nonspecific elevation of levels of transaminase and lactic acid dehydrogenase [LDH]: Secondary | ICD-10-CM | POA: Diagnosis not present

## 2017-06-03 DIAGNOSIS — M810 Age-related osteoporosis without current pathological fracture: Secondary | ICD-10-CM | POA: Diagnosis not present

## 2017-06-26 DIAGNOSIS — K602 Anal fissure, unspecified: Secondary | ICD-10-CM | POA: Diagnosis not present

## 2017-07-10 DIAGNOSIS — D225 Melanocytic nevi of trunk: Secondary | ICD-10-CM | POA: Diagnosis not present

## 2017-07-10 DIAGNOSIS — D2272 Melanocytic nevi of left lower limb, including hip: Secondary | ICD-10-CM | POA: Diagnosis not present

## 2017-07-10 DIAGNOSIS — D2271 Melanocytic nevi of right lower limb, including hip: Secondary | ICD-10-CM | POA: Diagnosis not present

## 2017-07-10 DIAGNOSIS — D2261 Melanocytic nevi of right upper limb, including shoulder: Secondary | ICD-10-CM | POA: Diagnosis not present

## 2017-07-10 DIAGNOSIS — D2262 Melanocytic nevi of left upper limb, including shoulder: Secondary | ICD-10-CM | POA: Diagnosis not present

## 2017-07-10 DIAGNOSIS — L821 Other seborrheic keratosis: Secondary | ICD-10-CM | POA: Diagnosis not present

## 2017-07-10 DIAGNOSIS — S20461A Insect bite (nonvenomous) of right back wall of thorax, initial encounter: Secondary | ICD-10-CM | POA: Diagnosis not present

## 2017-07-16 DIAGNOSIS — Z79899 Other long term (current) drug therapy: Secondary | ICD-10-CM | POA: Diagnosis not present

## 2017-07-16 DIAGNOSIS — Z9889 Other specified postprocedural states: Secondary | ICD-10-CM | POA: Diagnosis not present

## 2017-07-16 DIAGNOSIS — Z7951 Long term (current) use of inhaled steroids: Secondary | ICD-10-CM | POA: Diagnosis not present

## 2017-07-16 DIAGNOSIS — E785 Hyperlipidemia, unspecified: Secondary | ICD-10-CM | POA: Diagnosis not present

## 2017-07-16 DIAGNOSIS — Z885 Allergy status to narcotic agent status: Secondary | ICD-10-CM | POA: Diagnosis not present

## 2017-07-16 DIAGNOSIS — K602 Anal fissure, unspecified: Secondary | ICD-10-CM | POA: Diagnosis not present

## 2017-07-16 DIAGNOSIS — I10 Essential (primary) hypertension: Secondary | ICD-10-CM | POA: Diagnosis not present

## 2017-07-16 DIAGNOSIS — K219 Gastro-esophageal reflux disease without esophagitis: Secondary | ICD-10-CM | POA: Diagnosis not present

## 2017-07-16 DIAGNOSIS — M1990 Unspecified osteoarthritis, unspecified site: Secondary | ICD-10-CM | POA: Diagnosis not present

## 2017-07-16 DIAGNOSIS — K6289 Other specified diseases of anus and rectum: Secondary | ICD-10-CM | POA: Diagnosis not present

## 2017-07-16 DIAGNOSIS — F331 Major depressive disorder, recurrent, moderate: Secondary | ICD-10-CM | POA: Diagnosis not present

## 2017-07-16 DIAGNOSIS — H3589 Other specified retinal disorders: Secondary | ICD-10-CM | POA: Diagnosis not present

## 2017-07-16 DIAGNOSIS — Z7982 Long term (current) use of aspirin: Secondary | ICD-10-CM | POA: Diagnosis not present

## 2017-07-16 DIAGNOSIS — H547 Unspecified visual loss: Secondary | ICD-10-CM | POA: Diagnosis not present

## 2017-07-16 DIAGNOSIS — Z888 Allergy status to other drugs, medicaments and biological substances status: Secondary | ICD-10-CM | POA: Diagnosis not present

## 2017-08-05 DIAGNOSIS — J209 Acute bronchitis, unspecified: Secondary | ICD-10-CM | POA: Diagnosis not present

## 2017-08-14 DIAGNOSIS — Z4889 Encounter for other specified surgical aftercare: Secondary | ICD-10-CM | POA: Diagnosis not present

## 2017-08-14 DIAGNOSIS — K6289 Other specified diseases of anus and rectum: Secondary | ICD-10-CM | POA: Diagnosis not present

## 2017-09-01 DIAGNOSIS — R110 Nausea: Secondary | ICD-10-CM | POA: Diagnosis not present

## 2017-09-01 DIAGNOSIS — Z1211 Encounter for screening for malignant neoplasm of colon: Secondary | ICD-10-CM | POA: Diagnosis not present

## 2017-09-01 DIAGNOSIS — R1314 Dysphagia, pharyngoesophageal phase: Secondary | ICD-10-CM | POA: Diagnosis not present

## 2017-09-17 ENCOUNTER — Ambulatory Visit: Payer: PPO | Admitting: Anesthesiology

## 2017-09-17 ENCOUNTER — Encounter
Admission: RE | Disposition: A | Discharge status: Home / Self Care | Payer: Self-pay | Source: Ambulatory Visit | Attending: Internal Medicine

## 2017-09-17 ENCOUNTER — Other Ambulatory Visit: Payer: Self-pay

## 2017-09-17 ENCOUNTER — Ambulatory Visit
Admission: RE | Admit: 2017-09-17 | Discharge: 2017-09-17 | Disposition: A | Discharge status: Home / Self Care | Payer: PPO | Source: Ambulatory Visit | Attending: Internal Medicine | Admitting: Internal Medicine

## 2017-09-17 DIAGNOSIS — K2960 Other gastritis without bleeding: Secondary | ICD-10-CM | POA: Diagnosis not present

## 2017-09-17 DIAGNOSIS — Z7982 Long term (current) use of aspirin: Secondary | ICD-10-CM | POA: Diagnosis not present

## 2017-09-17 DIAGNOSIS — E782 Mixed hyperlipidemia: Secondary | ICD-10-CM | POA: Diagnosis not present

## 2017-09-17 DIAGNOSIS — I10 Essential (primary) hypertension: Secondary | ICD-10-CM | POA: Diagnosis not present

## 2017-09-17 DIAGNOSIS — R110 Nausea: Secondary | ICD-10-CM | POA: Diagnosis not present

## 2017-09-17 DIAGNOSIS — Z8711 Personal history of peptic ulcer disease: Secondary | ICD-10-CM | POA: Insufficient documentation

## 2017-09-17 DIAGNOSIS — F419 Anxiety disorder, unspecified: Secondary | ICD-10-CM | POA: Insufficient documentation

## 2017-09-17 DIAGNOSIS — F329 Major depressive disorder, single episode, unspecified: Secondary | ICD-10-CM | POA: Diagnosis not present

## 2017-09-17 DIAGNOSIS — K209 Esophagitis, unspecified: Secondary | ICD-10-CM | POA: Diagnosis not present

## 2017-09-17 DIAGNOSIS — Z79899 Other long term (current) drug therapy: Secondary | ICD-10-CM | POA: Diagnosis not present

## 2017-09-17 DIAGNOSIS — Z87891 Personal history of nicotine dependence: Secondary | ICD-10-CM | POA: Diagnosis not present

## 2017-09-17 DIAGNOSIS — K210 Gastro-esophageal reflux disease with esophagitis: Secondary | ICD-10-CM | POA: Diagnosis not present

## 2017-09-17 DIAGNOSIS — K2950 Unspecified chronic gastritis without bleeding: Secondary | ICD-10-CM | POA: Diagnosis not present

## 2017-09-17 DIAGNOSIS — R1314 Dysphagia, pharyngoesophageal phase: Secondary | ICD-10-CM | POA: Insufficient documentation

## 2017-09-17 DIAGNOSIS — R1310 Dysphagia, unspecified: Secondary | ICD-10-CM | POA: Diagnosis not present

## 2017-09-17 DIAGNOSIS — F418 Other specified anxiety disorders: Secondary | ICD-10-CM | POA: Diagnosis not present

## 2017-09-17 DIAGNOSIS — E7800 Pure hypercholesterolemia, unspecified: Secondary | ICD-10-CM | POA: Insufficient documentation

## 2017-09-17 DIAGNOSIS — K2970 Gastritis, unspecified, without bleeding: Secondary | ICD-10-CM | POA: Diagnosis not present

## 2017-09-17 DIAGNOSIS — K3189 Other diseases of stomach and duodenum: Secondary | ICD-10-CM | POA: Diagnosis not present

## 2017-09-17 HISTORY — DX: Anemia, unspecified: D64.9

## 2017-09-17 HISTORY — PX: ESOPHAGOGASTRODUODENOSCOPY (EGD) WITH PROPOFOL: SHX5813

## 2017-09-17 SURGERY — ESOPHAGOGASTRODUODENOSCOPY (EGD) WITH PROPOFOL
Anesthesia: General

## 2017-09-17 MED ORDER — LIDOCAINE 2% (20 MG/ML) 5 ML SYRINGE
INTRAMUSCULAR | Status: DC | PRN
  Administered 2017-09-17: 30 mg via INTRAVENOUS

## 2017-09-17 MED ORDER — LIDOCAINE HCL (PF) 2 % IJ SOLN
INTRAMUSCULAR | Status: AC
  Filled 2017-09-17: qty 10

## 2017-09-17 MED ORDER — SODIUM CHLORIDE 0.9 % IV SOLN
INTRAVENOUS | Status: DC
  Administered 2017-09-17 (×2): via INTRAVENOUS

## 2017-09-17 MED ORDER — PROPOFOL 500 MG/50ML IV EMUL
INTRAVENOUS | Status: AC
  Filled 2017-09-17: qty 50

## 2017-09-17 MED ORDER — PROPOFOL 10 MG/ML IV BOLUS
INTRAVENOUS | Status: DC | PRN
  Administered 2017-09-17: 100 mg via INTRAVENOUS

## 2017-09-17 MED ORDER — FENTANYL CITRATE (PF) 100 MCG/2ML IJ SOLN
INTRAMUSCULAR | Status: AC
  Filled 2017-09-17: qty 2

## 2017-09-17 MED ORDER — PROPOFOL 500 MG/50ML IV EMUL
INTRAVENOUS | Status: DC | PRN
  Administered 2017-09-17: 150 ug/kg/min via INTRAVENOUS

## 2017-09-17 MED ORDER — IPRATROPIUM-ALBUTEROL 0.5-2.5 (3) MG/3ML IN SOLN
3.0000 mL | Freq: Once | RESPIRATORY_TRACT | Status: AC
  Administered 2017-09-17: 3 mL via RESPIRATORY_TRACT

## 2017-09-17 MED ORDER — IPRATROPIUM-ALBUTEROL 0.5-2.5 (3) MG/3ML IN SOLN
RESPIRATORY_TRACT | Status: AC
  Filled 2017-09-17: qty 3

## 2017-09-17 MED ORDER — PROPOFOL 10 MG/ML IV BOLUS
INTRAVENOUS | Status: AC
  Filled 2017-09-17: qty 20

## 2017-09-17 MED ORDER — FENTANYL CITRATE (PF) 100 MCG/2ML IJ SOLN
INTRAMUSCULAR | Status: DC | PRN
  Administered 2017-09-17: 50 ug via INTRAVENOUS

## 2017-09-17 NOTE — Interval H&P Note (Signed)
History and Physical Interval Note:  09/17/2017 8:07 AM  Sandra [name]  has presented today for surgery, with the diagnosis of NAUSEA  PHARNGOESOPHAGEAL DYSPHAGIA  The various methods of treatment have been discussed with the patient and family. After consideration of risks, benefits and other options for treatment, the patient has consented to  Procedure(s): ESOPHAGOGASTRODUODENOSCOPY (EGD) WITH PROPOFOL (N/A) as a surgical intervention .  The patient's history has been reviewed, patient examined, no change in status, stable for surgery.  I have reviewed the patient's chart and labs.  Questions were answered to the patient's satisfaction.     [place], [place]

## 2017-09-17 NOTE — H&P (Signed)
Outpatient short stay form Pre-procedure 09/17/2017 8:01 AM Sandra [name] K. Sandra [name], M.D.  Primary Physician: Sandra [name], M.D.  Reason for visit:  pharyngeoesophageal dysphagia, Nausea  History of present illness: 62 y/o female presents with dysphagia "for as long as I can remember", mainly to solid foods. She has undergone EGD and esophageal dilation empirically in the past. She has chronic nausea, but no intractable vomiting for which she has taken Zofran. She is due for colon cancer screening but she declined the procedure at this time.   No current facility-administered medications for this encounter.   Medications Prior to Admission  Medication Sig Dispense Refill Last Dose  . acetaminophen (TYLENOL) 325 MG tablet Take 650 mg by mouth every 6 (six) hours as needed.   03/06/2017 at Unknown time  . albuterol (PROAIR HFA) 108 (90 Base) MCG/ACT inhaler Inhale 2 puffs into the lungs every 6 (six) hours as needed for wheezing or shortness of breath.   03/07/2017 at Unknown time  . ALPRAZolam (XANAX) 0.25 MG tablet Take 0.25 mg by mouth at bedtime as needed for anxiety.   03/06/2017 at Unknown time  . amlodipine-benazepril (LOTREL) 2.5-10 MG capsule Take 1 capsule by mouth every morning.   03/06/2017 at Unknown time  . aspirin 81 MG tablet Take 81 mg by mouth daily.   02/28/2017  . benzonatate (TESSALON) 200 MG capsule Take 200 mg by mouth 3 (three) times daily as needed for cough.   03/06/2017 at Unknown time  . cyclobenzaprine (FLEXERIL) 10 MG tablet Take 10 mg by mouth 3 (three) times daily as needed for muscle spasms.   03/06/2017 at Unknown time  . escitalopram (LEXAPRO) 10 MG tablet Take 10 mg by mouth daily.   Not Taking at Unknown time  . eszopiclone (LUNESTA) 2 MG TABS tablet Take 2 mg by mouth at bedtime as needed for sleep. Take immediately before bedtime   03/06/2017 at Unknown time  . fluticasone (FLONASE) 50 MCG/ACT nasal spray Place 2 sprays into both nostrils daily.   03/06/2017 at Unknown  time  . gabapentin (NEURONTIN) 400 MG capsule Take 400 mg by mouth 2 (two) times daily.    03/07/2017 at Unknown time  . lansoprazole (PREVACID) 30 MG capsule Take 30 mg by mouth every morning.    03/07/2017 at Unknown time  . lidocaine (LMX) 4 % cream Apply 1 application topically 3 (three) times daily as needed.     . Melatonin 10 MG TABS Take 2 tablets by mouth as needed (TAKES WITH LUNESTA IF THE LUNESTA DOES NOT HELP).    Past Week at Unknown time  . ondansetron (ZOFRAN ODT) 4 MG disintegrating tablet Take 1 tablet (4 mg total) by mouth every 8 (eight) hours as needed for nausea or vomiting. 20 tablet 0 03/06/2017 at Unknown time  . simvastatin (ZOCOR) 20 MG tablet Take 20 mg by mouth at bedtime.    03/06/2017 at Unknown time  . traMADol (ULTRAM) 50 MG tablet Take 2 tablets (100 mg total) by mouth every 6 (six) hours as needed for moderate pain or severe pain. 240 tablet 5 03/06/2017 at Unknown time  . traMADol (ULTRAM) 50 MG tablet Take 2 tablets (100 mg total) by mouth every 6 (six) hours as needed for up to 20 days. 20 tablet 0   . vitamin B-12 (CYANOCOBALAMIN) 500 MCG tablet Take 500 mcg by mouth daily.   Past Week at Unknown time     Allergies  Allergen Reactions  . Ambien [Zolpidem] Other (See Comments)  Sleep walking   . Codeine Other (See Comments)    Stomach pain  . Oxycontin [Oxycodone Hcl] Nausea And Vomiting  . Trazodone And Nefazodone Hives and Itching  . Vicodin [Hydrocodone-Acetaminophen] Itching     Past Medical History:  Diagnosis Date  . Anemia   . Anxiety 08/01/2014  . Arthritis, degenerative 07/30/2013  . Clinical depression 06/30/2014  . Depression   . Elevated liver enzymes   . GERD (gastroesophageal reflux disease)   . H/O breast biopsy 01/05/2015  . H/O: attempted suicide 2013  . Hepatitis B   . Hiatal hernia   . History of hysterectomy 01/05/2015  . History of peptic ulcer disease 01/05/2015  . Hypercholesteremia   . Hypertension   . Legally blind    . Panic attacks   . PUD (peptic ulcer disease)   . Rectocele 01/05/2015  . Retinitis pigmentosa   . S/P cholecystectomy 01/05/2015    Review of systems:  Otherwise negative.    Physical Exam  Gen: Alert, oriented. Appears stated age.  HEENT: [place]/AT. PERRLA. Lungs: CTA, no wheezes. CV: RR nl S1, S2. Abd: soft, benign, no masses. BS+ Ext: No edema. Pulses 2+    Planned procedures: Proceed with EGD. The patient understands the nature of the planned procedure, indications, risks, alternatives and potential complications including but not limited to bleeding, infection, perforation, damage to internal organs and possible oversedation/side effects from anesthesia. The patient agrees and gives consent to proceed.  Please refer to procedure notes for findings, recommendations and patient disposition/instructions.     Sandra [name] K. Sandra [name], M.D. Gastroenterology 09/17/2017  8:01 AM

## 2017-09-17 NOTE — Anesthesia Post-op Follow-up Note (Signed)
Anesthesia QCDR form completed.        

## 2017-09-17 NOTE — Anesthesia Preprocedure Evaluation (Signed)
Anesthesia Evaluation  Patient identified by MRN, date of birth, ID band Patient awake    Reviewed: Allergy & Precautions, H&P , NPO status , Patient's Chart, lab work & pertinent test results  History of Anesthesia Complications Negative for: history of anesthetic complications  Airway Mallampati: III  TM Distance: <3 FB Neck ROM: full    Dental  (+) Chipped, Poor Dentition   Pulmonary neg shortness of breath, former smoker,           Cardiovascular Exercise Tolerance: Good hypertension,      Neuro/Psych PSYCHIATRIC DISORDERS Anxiety Depression  Neuromuscular disease    GI/Hepatic hiatal hernia, PUD, GERD  Medicated and Controlled,(+) Hepatitis -  Endo/Other  negative endocrine ROS  Renal/GU negative Renal ROS  negative genitourinary   Musculoskeletal  (+) Arthritis ,   Abdominal   Peds  Hematology negative hematology ROS (+)   Anesthesia Other Findings Past Medical History: No date: Anemia 08/01/2014: Anxiety 07/30/2013: Arthritis, degenerative 06/30/2014: Clinical depression No date: Depression No date: Elevated liver enzymes No date: GERD (gastroesophageal reflux disease) 01/05/2015: H/O breast biopsy 2013: H/O: attempted suicide No date: Hepatitis B No date: Hiatal hernia 01/05/2015: History of hysterectomy 01/05/2015: History of peptic ulcer disease No date: Hypercholesteremia No date: Hypertension No date: Legally blind No date: Panic attacks No date: PUD (peptic ulcer disease) 01/05/2015: Rectocele No date: Retinitis pigmentosa 01/05/2015: S/P cholecystectomy  Past Surgical History: No date: ABDOMINAL HYSTERECTOMY No date: APPENDECTOMY 1977: BREAST BIOPSY; Bilateral     Comment:  neg No date: CHOLECYSTECTOMY No date: COLONOSCOPY No date: ESOPHAGOGASTRODUODENOSCOPY 03/07/2017: HEMORRHOID SURGERY; N/A     Comment:  Procedure: HEMORRHOIDECTOMY;  Surgeon: Leonie Green,  MD;  Location: [facility] ORS;  Service: General;                Laterality: N/A; No date: HERNIA REPAIR No date: RECTOCELE REPAIR 01/24/2017: SPHINCTEROTOMY; N/A     Comment:  Procedure: SPHINCTEROTOMY;  Surgeon: Leonie Green, MD;  Location: [facility] ORS;  Service: General;                Laterality: N/A;  BMI    Body Mass Index:  23.43 kg/m      Reproductive/Obstetrics negative OB ROS                             Anesthesia Physical Anesthesia Plan  ASA: III  Anesthesia Plan: General   Post-op Pain Management:    Induction: Intravenous  PONV Risk Score and Plan: Propofol infusion and TIVA  Airway Management Planned: Natural Airway and Nasal Cannula  Additional Equipment:   Intra-op Plan:   Post-operative Plan:   Informed Consent: I have reviewed the patients History and Physical, chart, labs and discussed the procedure including the risks, benefits and alternatives for the proposed anesthesia with the patient or authorized representative who has indicated his/her understanding and acceptance.   Dental Advisory Given  Plan Discussed with: Anesthesiologist, CRNA and Surgeon  Anesthesia Plan Comments: (Patient consented for risks of anesthesia including but not limited to:  - adverse reactions to medications - risk of intubation if required - damage to teeth, lips or other oral mucosa - sore throat or hoarseness - Damage to heart, brain, lungs or loss of life  Patient voiced understanding.)  Anesthesia Quick Evaluation  

## 2017-09-17 NOTE — Transfer of Care (Signed)
Immediate Anesthesia Transfer of Care Note  Patient: Sandra [name]  Procedure(s) Performed: ESOPHAGOGASTRODUODENOSCOPY (EGD) WITH PROPOFOL (N/A )  Patient Location: PACU and Endoscopy Unit  Anesthesia Type:General  Level of Consciousness: awake, drowsy and patient cooperative  Airway & Oxygen Therapy: Patient Spontanous Breathing and Patient connected to nasal cannula oxygen  Post-op Assessment: Report given to RN and Post -op Vital signs reviewed and stable  Post vital signs: Reviewed and stable  Last Vitals:  Vitals Value Taken Time  BP    Temp    Pulse    Resp    SpO2      Last Pain:  Vitals:   09/17/17 0815  TempSrc: Oral  PainSc: 6          Complications: No apparent anesthesia complications

## 2017-09-17 NOTE — Anesthesia Postprocedure Evaluation (Signed)
Anesthesia Post Note  Patient: Deamber Glacken  Procedure(s) Performed: ESOPHAGOGASTRODUODENOSCOPY (EGD) WITH PROPOFOL (N/A )  Patient location during evaluation: Endoscopy Anesthesia Type: General Level of consciousness: awake and alert Pain management: pain level controlled Vital Signs Assessment: post-procedure vital signs reviewed and stable Respiratory status: spontaneous breathing, nonlabored ventilation, respiratory function stable and patient connected to nasal cannula oxygen Cardiovascular status: blood pressure returned to baseline and stable Postop Assessment: no apparent nausea or vomiting Anesthetic complications: no     Last Vitals:  Vitals:   09/17/17 0907 09/17/17 0935  BP:  (!) 136/58  Pulse: 87   Resp: (!) 8   Temp: (!) 36.1 C   SpO2: 100%     Last Pain:  Vitals:   09/17/17 0921  TempSrc:   PainSc: 5                  Precious Haws [place]

## 2017-09-17 NOTE — Op Note (Signed)
[facility] Gastroenterology Patient Name: Sandra [name] Procedure Date: 09/17/2017 8:42 AM MRN: [id number] Account #: [id number] Date of Birth: May 13, 1955 Admit Type: Outpatient Age: 62 Room: [facility] ENDO ROOM 2 Gender: Female Note Status: Finalized Procedure:            Upper GI endoscopy Indications:          Dysphagia, Nausea Providers:            Benay Pike. [name] MD, MD Referring MD:         Glendon Axe (Referring MD) Medicines:            Propofol per Anesthesia Complications:        No immediate complications. Procedure:            Pre-Anesthesia Assessment:                       - The risks and benefits of the procedure and the                        sedation options and risks were discussed with the                        patient. All questions were answered and informed                        consent was obtained.                       - Patient identification and proposed procedure were                        verified prior to the procedure by the nurse. The                        procedure was verified in the procedure room.                       - ASA Grade Assessment: III - A patient with severe                        systemic disease.                       - After reviewing the risks and benefits, the patient                        was deemed in satisfactory condition to undergo the                        procedure.                       After obtaining informed consent, the endoscope was                        passed under direct vision. Throughout the procedure,                        the patient's blood pressure, pulse, and oxygen  saturations were monitored continuously. The Endoscope                        was introduced through the mouth, and advanced to the                        third part of duodenum. The upper GI endoscopy was                        accomplished without difficulty. The patient tolerated               the procedure well. The upper GI endoscopy was                        accomplished without difficulty. The patient tolerated                        the procedure well. Findings:      No endoscopic abnormality was evident in the esophagus to explain the       patient's complaint of dysphagia. It was decided, however, to proceed       with dilation in the distal esophagus. The scope was withdrawn. Dilation       was performed with a Maloney dilator with no resistance at 59 Fr.      There is no endoscopic evidence of stenosis or stricture in the lower       third of the esophagus. Biopsies were obtained from the proximal and       distal esophagus with cold forceps for histology of suspected       eosinophilic esophagitis.      Patchy mildly erythematous mucosa without bleeding was found in the       gastric antrum. Biopsies were taken with a cold forceps for Helicobacter       pylori testing.      The cardia and gastric fundus were normal on retroflexion.      The examined duodenum was normal. Impression:           - No endoscopic esophageal abnormality to explain                        patient's dysphagia. Esophagus dilated. Dilated.                       - Erythematous mucosa in the antrum. Biopsied.                       - Normal examined duodenum. Recommendation:       - Await pathology results.                       - Monitor results to esophageal dilation Procedure Code(s):    --- Professional ---                       [phone], Esophagogastroduodenoscopy, flexible, transoral;                        with biopsy, single or multiple                       35329, Dilation of esophagus, by unguided sound or  bougie, single or multiple passes Diagnosis Code(s):    --- Professional ---                       R11.0, Nausea                       K31.89, Other diseases of stomach and duodenum                       R13.10, Dysphagia, unspecified CPT copyright  2017 American Medical Association. All rights reserved. The codes documented in this report are preliminary and upon coder review may  be revised to meet current compliance requirements. Efrain Sella MD, MD 09/17/2017 9:06:04 AM This report has been signed electronically. Number of Addenda: 0 Note Initiated On: 09/17/2017 8:42 AM      [facility]

## 2017-09-18 ENCOUNTER — Encounter: Payer: Self-pay | Admitting: Internal Medicine

## 2017-09-22 LAB — SURGICAL PATHOLOGY

## 2017-09-29 ENCOUNTER — Encounter: Payer: Self-pay | Admitting: Physical Therapy

## 2017-09-29 ENCOUNTER — Other Ambulatory Visit: Payer: Self-pay

## 2017-09-29 ENCOUNTER — Ambulatory Visit: Payer: PPO | Attending: Pain Medicine | Admitting: Physical Therapy

## 2017-09-29 DIAGNOSIS — M533 Sacrococcygeal disorders, not elsewhere classified: Secondary | ICD-10-CM

## 2017-09-29 DIAGNOSIS — M25561 Pain in right knee: Secondary | ICD-10-CM | POA: Insufficient documentation

## 2017-09-29 DIAGNOSIS — M25562 Pain in left knee: Secondary | ICD-10-CM | POA: Insufficient documentation

## 2017-09-29 DIAGNOSIS — G8929 Other chronic pain: Secondary | ICD-10-CM

## 2017-09-29 DIAGNOSIS — M4125 Other idiopathic scoliosis, thoracolumbar region: Secondary | ICD-10-CM | POA: Diagnosis not present

## 2017-09-29 NOTE — Patient Instructions (Addendum)
  Seated stretches with feet on ground :  Cat / cow ,  Palms on the thighs inhale, chin tuck, gaze to the floor Exhale, palms on thighs, chest lifts, elbows/ shoulder blades back  The bend occurs in the midback not the low back  5 reps   Seated twist,  Inhale, lengthen spine tall,  Exhale, turn navel to the R , then the chest, look over R shoulderL hand on R thigh, R hand by the hips on the bed.chair  Other side.   3 breaths   ______   Sleep with pillow between knee
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
## 2017-10-03 NOTE — Addendum Note (Signed)
Addended by: Jerl Mina on: 10/03/2017 10:00 AM   Modules accepted: Orders

## 2017-10-08 ENCOUNTER — Encounter: Payer: PPO | Admitting: Physical Therapy

## 2017-10-17 ENCOUNTER — Ambulatory Visit: Payer: PPO | Admitting: Physical Therapy

## 2017-10-28 DIAGNOSIS — K5900 Constipation, unspecified: Secondary | ICD-10-CM | POA: Diagnosis not present

## 2017-10-28 DIAGNOSIS — R140 Abdominal distension (gaseous): Secondary | ICD-10-CM | POA: Diagnosis not present

## 2017-10-28 DIAGNOSIS — R110 Nausea: Secondary | ICD-10-CM | POA: Diagnosis not present

## 2017-11-04 ENCOUNTER — Ambulatory Visit: Payer: PPO | Admitting: Physical Therapy

## 2017-11-06 ENCOUNTER — Ambulatory Visit: Payer: PPO | Admitting: Physical Therapy

## 2017-11-10 ENCOUNTER — Ambulatory Visit: Payer: PPO | Admitting: Physical Therapy

## 2017-11-17 ENCOUNTER — Ambulatory Visit: Payer: PPO | Attending: Colon and Rectal Surgery | Admitting: Physical Therapy

## 2017-11-17 DIAGNOSIS — M4125 Other idiopathic scoliosis, thoracolumbar region: Secondary | ICD-10-CM | POA: Insufficient documentation

## 2017-11-17 DIAGNOSIS — M25562 Pain in left knee: Secondary | ICD-10-CM | POA: Insufficient documentation

## 2017-11-17 DIAGNOSIS — M533 Sacrococcygeal disorders, not elsewhere classified: Secondary | ICD-10-CM | POA: Diagnosis not present

## 2017-11-17 DIAGNOSIS — M25561 Pain in right knee: Secondary | ICD-10-CM | POA: Diagnosis not present

## 2017-11-17 DIAGNOSIS — G8929 Other chronic pain: Secondary | ICD-10-CM | POA: Diagnosis not present
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
## 2017-11-17 NOTE — Patient Instructions (Addendum)
Morning and night and if you can do in afternoon   Clam Shell 45 Degrees   Lying with hips and knees bent 45, one pillow between knees and ankles. Lift knee with exhale. Be sure pelvis does not roll backward. Do not arch back. Do 10 times, each leg, 2 times per day.  ______  Open book  ( handout)

## 2017-11-24 ENCOUNTER — Ambulatory Visit: Payer: PPO | Attending: Colon and Rectal Surgery | Admitting: Physical Therapy

## 2017-11-24 DIAGNOSIS — M4125 Other idiopathic scoliosis, thoracolumbar region: Secondary | ICD-10-CM

## 2017-11-24 DIAGNOSIS — M25561 Pain in right knee: Secondary | ICD-10-CM | POA: Insufficient documentation

## 2017-11-24 DIAGNOSIS — G8929 Other chronic pain: Secondary | ICD-10-CM | POA: Insufficient documentation

## 2017-11-24 DIAGNOSIS — M25562 Pain in left knee: Secondary | ICD-10-CM | POA: Diagnosis not present

## 2017-11-24 DIAGNOSIS — M533 Sacrococcygeal disorders, not elsewhere classified: Secondary | ICD-10-CM | POA: Diagnosis not present

## 2017-11-24 DIAGNOSIS — M791 Myalgia, unspecified site: Secondary | ICD-10-CM | POA: Diagnosis not present

## 2017-11-24 NOTE — Patient Instructions (Signed)
Anterior tilt pelvis  When sleeping on your side    _________    Frog stretch: laying on belly with pillow under hips, knees bent, inhale do nothing, exhale let ankles fall apart    10 x 3 in the morning, evening
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
## 2017-11-28 DIAGNOSIS — I10 Essential (primary) hypertension: Secondary | ICD-10-CM | POA: Diagnosis not present

## 2017-11-28 DIAGNOSIS — R748 Abnormal levels of other serum enzymes: Secondary | ICD-10-CM | POA: Diagnosis not present

## 2017-11-28 DIAGNOSIS — K219 Gastro-esophageal reflux disease without esophagitis: Secondary | ICD-10-CM | POA: Diagnosis not present

## 2017-11-28 DIAGNOSIS — Z23 Encounter for immunization: Secondary | ICD-10-CM | POA: Diagnosis not present

## 2017-11-28 DIAGNOSIS — F418 Other specified anxiety disorders: Secondary | ICD-10-CM | POA: Diagnosis not present

## 2017-11-28 DIAGNOSIS — Z1239 Encounter for other screening for malignant neoplasm of breast: Secondary | ICD-10-CM | POA: Diagnosis not present

## 2017-12-01 ENCOUNTER — Ambulatory Visit: Payer: PPO | Admitting: Physical Therapy

## 2017-12-01 ENCOUNTER — Other Ambulatory Visit: Payer: Self-pay | Admitting: Internal Medicine

## 2017-12-01 DIAGNOSIS — M533 Sacrococcygeal disorders, not elsewhere classified: Secondary | ICD-10-CM

## 2017-12-01 DIAGNOSIS — M4125 Other idiopathic scoliosis, thoracolumbar region: Secondary | ICD-10-CM

## 2017-12-01 DIAGNOSIS — G8929 Other chronic pain: Secondary | ICD-10-CM

## 2017-12-01 DIAGNOSIS — R748 Abnormal levels of other serum enzymes: Secondary | ICD-10-CM

## 2017-12-01 DIAGNOSIS — M25562 Pain in left knee: Secondary | ICD-10-CM

## 2017-12-01 DIAGNOSIS — Z1231 Encounter for screening mammogram for malignant neoplasm of breast: Secondary | ICD-10-CM

## 2017-12-01 DIAGNOSIS — M25561 Pain in right knee: Secondary | ICD-10-CM

## 2017-12-01 NOTE — Patient Instructions (Signed)
Fall asleep betteR:   Turn off TV 2 hours before    Stretches and PT exercise   Body scan 3 cycles    _______  To decrease tail bone pain:   Frog stretch: laying on belly with pillow under hips, knees bent, inhale do nothing, exhale let ankles fall apart    ________ Ardine Eng poses rocking   Toes tucked, shoulders down and back, paws grip , hands shoulder width apart  10 reps    _________  Open book and clam shells ( not to rock hips with clams)     __________  On your back:   Laying on your back with a towel under opposite thigh of the thigh you want to stretch Cross __  ankle over __  Thigh  Inhale, feel pelvic floor lengthen/diaphragm expand left and right laterally Exhale, bring thighs closer to chest by pulling towel with hands. Keep shoulders and neck down on the pillow and relaxed.   5 breaths
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
## 2017-12-08 ENCOUNTER — Ambulatory Visit: Payer: PPO | Admitting: Physical Therapy

## 2017-12-08 VITALS — BP 128/74

## 2017-12-08 DIAGNOSIS — M4125 Other idiopathic scoliosis, thoracolumbar region: Secondary | ICD-10-CM

## 2017-12-08 DIAGNOSIS — M25562 Pain in left knee: Secondary | ICD-10-CM

## 2017-12-08 DIAGNOSIS — G8929 Other chronic pain: Secondary | ICD-10-CM

## 2017-12-08 DIAGNOSIS — M533 Sacrococcygeal disorders, not elsewhere classified: Secondary | ICD-10-CM

## 2017-12-08 DIAGNOSIS — M25561 Pain in right knee: Secondary | ICD-10-CM
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
## 2017-12-08 NOTE — Patient Instructions (Addendum)
Omit  childs pose out of routine   SIdelying:  Keep open book and clam shells   On your back: Deep core level 2  Added figure 4 stretch and cross thigh over stretch but do not bring close chest due to pain in back   On belly: Frog stretch 20 reps  Prone heel press 20 sec holds   Seated:  Cat and cow   Avoid sitting on couch due to the posterior pelvic tilt and loss of low back curve, Sit in dining chair when watching TV, feet on ground, anterior tilt of pelvis

## 2017-12-10 ENCOUNTER — Ambulatory Visit: Payer: PPO

## 2017-12-15 ENCOUNTER — Encounter: Payer: PPO | Admitting: Physical Therapy

## 2017-12-19 ENCOUNTER — Other Ambulatory Visit: Payer: Self-pay | Admitting: Internal Medicine

## 2017-12-19 ENCOUNTER — Ambulatory Visit
Admission: RE | Admit: 2017-12-19 | Discharge: 2017-12-19 | Disposition: A | Discharge status: Home / Self Care | Payer: PPO | Source: Ambulatory Visit | Attending: Internal Medicine | Admitting: Internal Medicine

## 2017-12-19 DIAGNOSIS — R748 Abnormal levels of other serum enzymes: Secondary | ICD-10-CM

## 2017-12-22 ENCOUNTER — Ambulatory Visit: Payer: PPO | Attending: Colon and Rectal Surgery | Admitting: Physical Therapy

## 2017-12-22 DIAGNOSIS — M4125 Other idiopathic scoliosis, thoracolumbar region: Secondary | ICD-10-CM | POA: Diagnosis not present

## 2017-12-22 DIAGNOSIS — M25562 Pain in left knee: Secondary | ICD-10-CM | POA: Diagnosis not present

## 2017-12-22 DIAGNOSIS — M25561 Pain in right knee: Secondary | ICD-10-CM | POA: Insufficient documentation

## 2017-12-22 DIAGNOSIS — G8929 Other chronic pain: Secondary | ICD-10-CM | POA: Diagnosis not present

## 2017-12-22 DIAGNOSIS — M533 Sacrococcygeal disorders, not elsewhere classified: Secondary | ICD-10-CM | POA: Diagnosis not present

## 2017-12-22 NOTE — Patient Instructions (Signed)
Minimize falls in the house :  Wear shoes with non skid soles in the house Remove throw rugs and clutter   Anterior tilt in pelvis in all positions
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
## 2017-12-23 DIAGNOSIS — K2950 Unspecified chronic gastritis without bleeding: Secondary | ICD-10-CM | POA: Diagnosis not present

## 2017-12-23 DIAGNOSIS — K6289 Other specified diseases of anus and rectum: Secondary | ICD-10-CM | POA: Diagnosis not present

## 2017-12-23 DIAGNOSIS — R1010 Upper abdominal pain, unspecified: Secondary | ICD-10-CM | POA: Diagnosis not present

## 2017-12-23 DIAGNOSIS — K219 Gastro-esophageal reflux disease without esophagitis: Secondary | ICD-10-CM | POA: Diagnosis not present

## 2017-12-23 DIAGNOSIS — R140 Abdominal distension (gaseous): Secondary | ICD-10-CM | POA: Diagnosis not present

## 2017-12-23 DIAGNOSIS — R110 Nausea: Secondary | ICD-10-CM | POA: Diagnosis not present

## 2017-12-23 DIAGNOSIS — G8929 Other chronic pain: Secondary | ICD-10-CM | POA: Diagnosis not present

## 2017-12-23 DIAGNOSIS — R945 Abnormal results of liver function studies: Secondary | ICD-10-CM | POA: Diagnosis not present

## 2017-12-25 ENCOUNTER — Ambulatory Visit
Admission: RE | Admit: 2017-12-25 | Discharge: 2017-12-25 | Disposition: A | Discharge status: Home / Self Care | Payer: PPO | Source: Ambulatory Visit | Attending: Internal Medicine | Admitting: Internal Medicine

## 2017-12-25 DIAGNOSIS — R7989 Other specified abnormal findings of blood chemistry: Secondary | ICD-10-CM | POA: Diagnosis not present

## 2017-12-25 DIAGNOSIS — R748 Abnormal levels of other serum enzymes: Secondary | ICD-10-CM | POA: Diagnosis not present

## 2017-12-25 DIAGNOSIS — Z9049 Acquired absence of other specified parts of digestive tract: Secondary | ICD-10-CM | POA: Insufficient documentation

## 2017-12-29 ENCOUNTER — Encounter: Payer: PPO | Admitting: Physical Therapy

## 2017-12-30 ENCOUNTER — Ambulatory Visit: Payer: PPO | Admitting: Physical Therapy

## 2018-01-07 ENCOUNTER — Encounter: Payer: PPO | Admitting: Physical Therapy

## 2018-01-12 ENCOUNTER — Encounter: Payer: PPO | Admitting: Physical Therapy

## 2018-01-27 DIAGNOSIS — R748 Abnormal levels of other serum enzymes: Secondary | ICD-10-CM | POA: Diagnosis not present

## 2018-01-27 DIAGNOSIS — G894 Chronic pain syndrome: Secondary | ICD-10-CM | POA: Diagnosis not present

## 2018-01-27 DIAGNOSIS — J0110 Acute frontal sinusitis, unspecified: Secondary | ICD-10-CM | POA: Diagnosis not present

## 2018-02-03 DIAGNOSIS — I10 Essential (primary) hypertension: Secondary | ICD-10-CM | POA: Diagnosis not present

## 2018-02-03 DIAGNOSIS — T464X5A Adverse effect of angiotensin-converting-enzyme inhibitors, initial encounter: Secondary | ICD-10-CM | POA: Diagnosis not present

## 2018-02-03 DIAGNOSIS — M810 Age-related osteoporosis without current pathological fracture: Secondary | ICD-10-CM | POA: Diagnosis not present

## 2018-02-03 DIAGNOSIS — R05 Cough: Secondary | ICD-10-CM | POA: Diagnosis not present

## 2018-02-03 DIAGNOSIS — F5104 Psychophysiologic insomnia: Secondary | ICD-10-CM | POA: Diagnosis not present

## 2018-02-03 DIAGNOSIS — Z0000 Encounter for general adult medical examination without abnormal findings: Secondary | ICD-10-CM | POA: Diagnosis not present

## 2018-02-22 ENCOUNTER — Emergency Department
Admission: EM | Admit: 2018-02-22 | Discharge: 2018-02-22 | Disposition: A | Discharge status: Home / Self Care | Payer: PPO | Attending: Emergency Medicine | Admitting: Emergency Medicine

## 2018-02-22 ENCOUNTER — Emergency Department: Payer: PPO

## 2018-02-22 ENCOUNTER — Other Ambulatory Visit: Payer: Self-pay

## 2018-02-22 DIAGNOSIS — Y999 Unspecified external cause status: Secondary | ICD-10-CM | POA: Diagnosis not present

## 2018-02-22 DIAGNOSIS — W108XXA Fall (on) (from) other stairs and steps, initial encounter: Secondary | ICD-10-CM | POA: Diagnosis not present

## 2018-02-22 DIAGNOSIS — Z79899 Other long term (current) drug therapy: Secondary | ICD-10-CM | POA: Diagnosis not present

## 2018-02-22 DIAGNOSIS — S0990XA Unspecified injury of head, initial encounter: Secondary | ICD-10-CM | POA: Diagnosis not present

## 2018-02-22 DIAGNOSIS — I10 Essential (primary) hypertension: Secondary | ICD-10-CM | POA: Diagnosis not present

## 2018-02-22 DIAGNOSIS — Z87891 Personal history of nicotine dependence: Secondary | ICD-10-CM | POA: Diagnosis not present

## 2018-02-22 DIAGNOSIS — Y9389 Activity, other specified: Secondary | ICD-10-CM | POA: Diagnosis not present

## 2018-02-22 DIAGNOSIS — S199XXA Unspecified injury of neck, initial encounter: Secondary | ICD-10-CM | POA: Diagnosis not present

## 2018-02-22 DIAGNOSIS — S058X1A Other injuries of right eye and orbit, initial encounter: Secondary | ICD-10-CM | POA: Diagnosis not present

## 2018-02-22 DIAGNOSIS — Z23 Encounter for immunization: Secondary | ICD-10-CM | POA: Diagnosis not present

## 2018-02-22 DIAGNOSIS — Y9222 Religious institution as the place of occurrence of the external cause: Secondary | ICD-10-CM | POA: Diagnosis not present

## 2018-02-22 DIAGNOSIS — M542 Cervicalgia: Secondary | ICD-10-CM | POA: Diagnosis not present

## 2018-02-22 MED ORDER — ACETAMINOPHEN 325 MG PO TABS
650.0000 mg | ORAL_TABLET | Freq: Once | ORAL | Status: AC
  Administered 2018-02-22: 650 mg via ORAL
  Filled 2018-02-22: qty 2

## 2018-02-22 MED ORDER — TETANUS-DIPHTH-ACELL PERTUSSIS 5-2.5-18.5 LF-MCG/0.5 IM SUSP
0.5000 mL | Freq: Once | INTRAMUSCULAR | Status: AC
  Administered 2018-02-22: 0.5 mL via INTRAMUSCULAR
  Filled 2018-02-22: qty 0.5

## 2018-02-22 NOTE — ED Notes (Signed)
Pt transported to CT at this time, via stretcher.
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
## 2018-02-22 NOTE — ED Triage Notes (Signed)
Pt fell on concrete coming out of church on concrete. Bleeding controlled at this time. No blood thinners. No LOC. Has cut to end of R eyebrow. Swelling noted there as well.  A&O. In wheelchair.

## 2018-02-27 ENCOUNTER — Ambulatory Visit
Admission: RE | Admit: 2018-02-27 | Discharge: 2018-02-27 | Disposition: A | Discharge status: Home / Self Care | Payer: PPO | Source: Ambulatory Visit | Attending: Internal Medicine | Admitting: Internal Medicine

## 2018-02-27 DIAGNOSIS — Z1231 Encounter for screening mammogram for malignant neoplasm of breast: Secondary | ICD-10-CM | POA: Diagnosis not present

## 2018-03-04 DIAGNOSIS — G894 Chronic pain syndrome: Secondary | ICD-10-CM | POA: Diagnosis not present

## 2018-03-15 ENCOUNTER — Other Ambulatory Visit: Payer: Self-pay

## 2018-03-15 ENCOUNTER — Emergency Department
Admission: EM | Admit: 2018-03-15 | Discharge: 2018-03-15 | Disposition: A | Discharge status: Home / Self Care | Payer: PPO | Attending: Emergency Medicine | Admitting: Emergency Medicine

## 2018-03-15 DIAGNOSIS — R252 Cramp and spasm: Secondary | ICD-10-CM | POA: Diagnosis not present

## 2018-03-15 DIAGNOSIS — Z87891 Personal history of nicotine dependence: Secondary | ICD-10-CM | POA: Insufficient documentation

## 2018-03-15 DIAGNOSIS — Z7982 Long term (current) use of aspirin: Secondary | ICD-10-CM | POA: Insufficient documentation

## 2018-03-15 DIAGNOSIS — Z79899 Other long term (current) drug therapy: Secondary | ICD-10-CM | POA: Diagnosis not present

## 2018-03-15 DIAGNOSIS — F19939 Other psychoactive substance use, unspecified with withdrawal, unspecified: Secondary | ICD-10-CM | POA: Diagnosis not present

## 2018-03-15 DIAGNOSIS — G4700 Insomnia, unspecified: Secondary | ICD-10-CM | POA: Diagnosis present

## 2018-03-15 DIAGNOSIS — R52 Pain, unspecified: Secondary | ICD-10-CM | POA: Diagnosis not present

## 2018-03-15 DIAGNOSIS — F1123 Opioid dependence with withdrawal: Secondary | ICD-10-CM

## 2018-03-15 DIAGNOSIS — I10 Essential (primary) hypertension: Secondary | ICD-10-CM | POA: Diagnosis not present

## 2018-03-15 DIAGNOSIS — F1193 Opioid use, unspecified with withdrawal: Secondary | ICD-10-CM

## 2018-03-15 MED ORDER — BUPRENORPHINE HCL-NALOXONE HCL 2-0.5 MG SL SUBL
1.0000 | SUBLINGUAL_TABLET | Freq: Every day | SUBLINGUAL | Status: DC
Start: 2018-03-15 — End: 2018-03-15
  Administered 2018-03-15: 1 via SUBLINGUAL
  Filled 2018-03-15: qty 1
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
## 2018-03-15 NOTE — ED Triage Notes (Signed)
Pt arrived from home via E[facility] with complaints of having withdrawals and bilateral arm and leg pain along with insomnia. Pt states that she has not taken her Tramadol in over a week. Pt is blind and ambulatory using seeing stick. VS per EMS BP-142/? HR-90 O2sat-98%RA Temp 98.4. Pt has Hx of HTN. Dr. Owens Shark at bedside.

## 2018-03-15 NOTE — ED Notes (Signed)
Called number provided by pt Sandra [name] [phone]) to provide ride home. Per Sandra [name], will be here to transport in 30 mins.

## 2018-03-15 NOTE — ED Notes (Signed)
Pt waiting on a ride

## 2018-04-22 ENCOUNTER — Other Ambulatory Visit: Payer: Self-pay

## 2018-04-22 ENCOUNTER — Ambulatory Visit: Payer: PPO | Attending: Colon and Rectal Surgery

## 2018-04-22 DIAGNOSIS — R262 Difficulty in walking, not elsewhere classified: Secondary | ICD-10-CM | POA: Insufficient documentation

## 2018-04-22 DIAGNOSIS — M6281 Muscle weakness (generalized): Secondary | ICD-10-CM | POA: Insufficient documentation

## 2018-04-22 DIAGNOSIS — G8929 Other chronic pain: Secondary | ICD-10-CM | POA: Insufficient documentation

## 2018-04-22 DIAGNOSIS — M545 Low back pain: Secondary | ICD-10-CM | POA: Insufficient documentation
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
## 2018-04-22 NOTE — Patient Instructions (Addendum)
Pt was recommended to sit on a pillow under her L hip to help decrease low back pain. Pt demonstrated and verbalized understanding.    Then perform B shoulder extension isometrcis, hands on thighs 5 seconds x 5 for at least 3 sets daily.

## 2018-04-27 ENCOUNTER — Ambulatory Visit: Payer: PPO

## 2018-04-27 DIAGNOSIS — M6281 Muscle weakness (generalized): Secondary | ICD-10-CM

## 2018-04-27 DIAGNOSIS — G8929 Other chronic pain: Secondary | ICD-10-CM

## 2018-04-27 DIAGNOSIS — R262 Difficulty in walking, not elsewhere classified: Secondary | ICD-10-CM

## 2018-04-27 DIAGNOSIS — M545 Low back pain, unspecified: Secondary | ICD-10-CM
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
## 2018-04-27 NOTE — Patient Instructions (Signed)
R S/L position with hips and knees flexed to decrease R lateral shift posture. Pillow between knees  Decreased back pain to 7/10  Reviewed and given as part of her HEP. Pt verbalized understanding.

## 2018-04-29 ENCOUNTER — Ambulatory Visit: Payer: PPO

## 2018-05-05 ENCOUNTER — Ambulatory Visit: Payer: PPO

## 2018-05-05 ENCOUNTER — Other Ambulatory Visit: Payer: Self-pay

## 2018-05-05 DIAGNOSIS — R262 Difficulty in walking, not elsewhere classified: Secondary | ICD-10-CM

## 2018-05-05 DIAGNOSIS — M545 Low back pain, unspecified: Secondary | ICD-10-CM

## 2018-05-05 DIAGNOSIS — G8929 Other chronic pain: Secondary | ICD-10-CM

## 2018-05-05 DIAGNOSIS — M6281 Muscle weakness (generalized): Secondary | ICD-10-CM

## 2018-05-05 DIAGNOSIS — F418 Other specified anxiety disorders: Secondary | ICD-10-CM | POA: Diagnosis not present
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
## 2018-05-07 ENCOUNTER — Ambulatory Visit: Payer: PPO

## 2018-05-12 ENCOUNTER — Ambulatory Visit: Payer: PPO

## 2018-05-18 ENCOUNTER — Ambulatory Visit: Payer: PPO

## 2018-05-19 DIAGNOSIS — J302 Other seasonal allergic rhinitis: Secondary | ICD-10-CM | POA: Diagnosis not present

## 2018-06-04 NOTE — Progress Notes (Signed)
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 (gastroesophageal reflux disease) 01/05/2015  . Legally blind 01/05/2015  . Impaired visual perception 01/05/2015  . Hyperlipidemia 01/05/2015  . History of panic attacks 01/05/2015  . Hiatal hernia 01/05/2015  . History of peptic ulcer disease 01/05/2015  . Retinitis pigmentosa 01/05/2015  . S/P cholecystectomy 01/05/2015  . H/O breast biopsy 01/05/2015  . Rectocele (repaired) 01/05/2015  . History of hysterectomy 01/05/2015  . Former smoker 01/05/2015  . Chronic low back pain (Location of Primary Source of Pain) (Bilateral) (R>L) 01/05/2015  . Chronic  bilateral knee pain 01/05/2015  . Osteoarthritis of both knees 01/05/2015  . Opiate dependence ([place]) 01/05/2015  . Lumbar facet arthropathy 01/05/2015  . Lumbar spondylosis 01/05/2015  . Encounter for chronic pain management 01/05/2015  . Lumbar facet syndrome (Bilateral) (R>L) 01/05/2015  . Diffuse  myofascial pain syndrome 01/05/2015  . Musculoskeletal pain 01/05/2015  . Neurogenic pain 01/05/2015  . Chronic female pelvic pain (with history of Dyspareunia) 01/05/2015  . Chronic lower extremity pain (Bilateral) 01/05/2015  . Chronic lumbar radicular pain (Bilateral) 01/05/2015  . Discogenic low back pain 01/05/2015  . Claustrophobia 02/09/2015  . Chronic neck pain 03/09/2015  . Chronic cervical radicular pain (Right) 03/09/2015  . Cervical spondylosis 03/09/2015  . 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 Mood disorder ([place])   . Encounter for screening for other viral diseases 04/05/2015  . Anxiety 08/01/2014  . Muscle spasm of back 10/24/2015  . Chronic pain syndrome 05/14/2016   Resolved Ambulatory Problems    Diagnosis Date Noted  . No Resolved Ambulatory Problems   Past Medical History:  Diagnosis Date  . Anemia   . Arthritis, degenerative 07/30/2013  . Clinical depression 06/30/2014  . Depression   . Elevated liver enzymes   . H/O: attempted suicide 2013  . Hepatitis B   . Hypercholesteremia   . Hypertension   . Panic attacks   . PUD (peptic ulcer disease)    Assessment  Primary Diagnosis & Pertinent Problem List: The primary encounter diagnosis was Chronic pain syndrome. Diagnoses of Chronic neck pain, Chronic lumbar radicular pain (Bilateral), Chronic lower extremity pain (Bilateral), Musculoskeletal pain, Lumbar facet syndrome (Bilateral) (R>L), Chronic bilateral knee pain, Psychiatric disorder, Long term prescription opiate use, Retinitis pigmentosa, Encounter for chronic pain management, Impaired visual perception, Generalized anxiety disorder, Depressive disorder, History of attempted suicide by overdosing with antidepressants, Cervical spondylosis, Diffuse myofascial pain syndrome, Lumbar spondylosis, Lumbar facet arthropathy, and [name] (CBS) were also pertinent to this visit.  Visit Diagnosis (New problems to examiner): 1. Chronic pain  syndrome   2. Chronic neck pain   3. Chronic lumbar radicular pain (Bilateral)   4. Chronic lower extremity pain (Bilateral)   5. Musculoskeletal pain   6. Lumbar facet syndrome (Bilateral) (R>L)   7. Chronic bilateral knee pain   8. Psychiatric disorder   9. Long term prescription opiate use   10. Retinitis pigmentosa   11. Encounter for chronic pain management   12. Impaired visual perception   13. Generalized anxiety disorder   14. Depressive disorder   15. History of attempted suicide by overdosing with antidepressants   16. Cervical spondylosis   17. Diffuse myofascial pain syndrome   18. Lumbar spondylosis   19. Lumbar facet arthropathy   20. [name] (CBS)    New patient phone encounter to discuss transfer of care for medication management of tramadol.  Of note patient has a complex chronic pain history as noted above.  She has been managed on tramadol 50 mg 3 times daily as needed by her primary care physician Dr. Candiss Norse who is now leaving [place].  Before taking on the patient for chronic opioid therapy, I had instance of discussion with the patient about our clinic policy.  Of note, patient will need a baseline urine drug screen before we can consider her for chronic opioid management.  Given the current situation with COVID-19, this does not have to be done urgently unless patient is in need of a refill.  Patient does have an appointment  with Dr. Candiss Norse at the end of this month.  I encouraged the patient to request Dr. Candiss Norse to provide her with a refill for 1 to 2 months until the patient is able to come to the pain clinic to provide a urine sample.  Patient endorsed understanding.  Furthermore I had a discussion with the patient about Xanax therapy.  I am aware that this patient does have a significant psychiatric history but I would prefer that the patient find an alternative to Xanax for the management of her anxiety since concomitant opioid and benzodiazepine therapy  can increase risk of adverse cardiorespiratory side effects.  Patient endorsed understanding.  Otherwise I instructed patient to continue gabapentin 400 mg 3 times daily as prescribed and Flexeril 10 mg 3 times daily as needed if she obtains benefit from these.  Plan of Care (Initial workup plan)  Note: Ms. Kaser was reminded that as per protocol, today's visit has been an evaluation only. We have not taken over the patient's controlled substance management.  Problem-specific plan: No problem-specific Assessment & Plan notes found for this encounter.   Lab Orders     Compliance Drug Analysis, Ur  Pharmacological management options:  Opioid Analgesics: The patient was informed that there is no guarantee that she would be a candidate for opioid analgesics. The decision will be made following CDC guidelines. This decision will be based on the results of diagnostic studies, as well as Ms. Cyphers's risk profile.   Membrane stabilizer: To be determined at a later time  Muscle relaxant: To be determined at a later time  NSAID: To be determined at a later time  Other analgesic(s): To be determined at a later time   Interventional management options: Ms. Klatt was informed that there is no guarantee that she would be a candidate for interventional therapies. The decision will be based on the results of diagnostic studies, as well as Ms. Tatsch's risk profile.  Procedure(s) under consideration:  Repeat lumbar facet medial branch nerve blocks Lumbar epidural steroid injection Sacroiliac joint injection Bilateral genicular nerve block   Provider-requested follow-up: Return for After UDS has been submitted, encourage patient to find alternative to Xanax with help with PCP.Marland Kitchen  Future Appointments  Date Time Provider [place]  06/10/2018  2:00 PM Sandra Santa, MD [facility] None    Primary Care Physician: Glendon Axe, MD Location: [facility] Note  by: Sandra Santa, MD Date: 06/10/2018; Time: 12:59 PM

## 2018-06-10 ENCOUNTER — Telehealth: Payer: Self-pay

## 2018-06-10 ENCOUNTER — Encounter: Payer: Self-pay | Admitting: Student in an Organized Health Care Education/Training Program

## 2018-06-10 ENCOUNTER — Other Ambulatory Visit: Payer: Self-pay

## 2018-06-10 ENCOUNTER — Ambulatory Visit
Payer: PPO | Attending: Student in an Organized Health Care Education/Training Program | Admitting: Student in an Organized Health Care Education/Training Program

## 2018-06-10 DIAGNOSIS — Z79891 Long term (current) use of opiate analgesic: Secondary | ICD-10-CM

## 2018-06-10 DIAGNOSIS — F99 Mental disorder, not otherwise specified: Secondary | ICD-10-CM | POA: Diagnosis not present

## 2018-06-10 DIAGNOSIS — G8929 Other chronic pain: Secondary | ICD-10-CM | POA: Diagnosis not present

## 2018-06-10 DIAGNOSIS — M542 Cervicalgia: Secondary | ICD-10-CM | POA: Diagnosis not present

## 2018-06-10 DIAGNOSIS — H3552 Pigmentary retinal dystrophy: Secondary | ICD-10-CM

## 2018-06-10 DIAGNOSIS — F32A Depression, unspecified: Secondary | ICD-10-CM

## 2018-06-10 DIAGNOSIS — H539 Unspecified visual disturbance: Secondary | ICD-10-CM | POA: Diagnosis not present

## 2018-06-10 DIAGNOSIS — G894 Chronic pain syndrome: Secondary | ICD-10-CM | POA: Diagnosis not present

## 2018-06-10 DIAGNOSIS — M7918 Myalgia, other site: Secondary | ICD-10-CM

## 2018-06-10 DIAGNOSIS — M47816 Spondylosis without myelopathy or radiculopathy, lumbar region: Secondary | ICD-10-CM | POA: Diagnosis not present

## 2018-06-10 DIAGNOSIS — F411 Generalized anxiety disorder: Secondary | ICD-10-CM

## 2018-06-10 DIAGNOSIS — M79604 Pain in right leg: Secondary | ICD-10-CM

## 2018-06-10 DIAGNOSIS — M25562 Pain in left knee: Secondary | ICD-10-CM

## 2018-06-10 DIAGNOSIS — Z915 Personal history of self-harm: Secondary | ICD-10-CM

## 2018-06-10 DIAGNOSIS — M5416 Radiculopathy, lumbar region: Secondary | ICD-10-CM | POA: Diagnosis not present

## 2018-06-10 DIAGNOSIS — H5316 Psychophysical visual disturbances: Secondary | ICD-10-CM

## 2018-06-10 DIAGNOSIS — F329 Major depressive disorder, single episode, unspecified: Secondary | ICD-10-CM

## 2018-06-10 DIAGNOSIS — M79605 Pain in left leg: Secondary | ICD-10-CM

## 2018-06-10 DIAGNOSIS — M47812 Spondylosis without myelopathy or radiculopathy, cervical region: Secondary | ICD-10-CM

## 2018-06-10 DIAGNOSIS — Z9151 Personal history of suicidal behavior: Secondary | ICD-10-CM

## 2018-06-10 DIAGNOSIS — M25561 Pain in right knee: Secondary | ICD-10-CM | POA: Diagnosis not present

## 2018-06-10 NOTE — Telephone Encounter (Signed)
Mailed AVS from 06/10/18 visit

## 2018-06-17 DIAGNOSIS — M545 Low back pain: Secondary | ICD-10-CM | POA: Diagnosis not present

## 2018-06-17 DIAGNOSIS — G8929 Other chronic pain: Secondary | ICD-10-CM | POA: Diagnosis not present

## 2018-06-17 DIAGNOSIS — R6889 Other general symptoms and signs: Secondary | ICD-10-CM | POA: Diagnosis not present

## 2018-06-22 ENCOUNTER — Encounter: Payer: Self-pay | Admitting: Student in an Organized Health Care Education/Training Program

## 2018-06-23 ENCOUNTER — Other Ambulatory Visit: Payer: Self-pay

## 2018-06-23 ENCOUNTER — Ambulatory Visit
Payer: PPO | Attending: Student in an Organized Health Care Education/Training Program | Admitting: Student in an Organized Health Care Education/Training Program

## 2018-06-23 DIAGNOSIS — M47812 Spondylosis without myelopathy or radiculopathy, cervical region: Secondary | ICD-10-CM | POA: Diagnosis not present

## 2018-06-23 DIAGNOSIS — H539 Unspecified visual disturbance: Secondary | ICD-10-CM

## 2018-06-23 DIAGNOSIS — M5416 Radiculopathy, lumbar region: Secondary | ICD-10-CM | POA: Diagnosis not present

## 2018-06-23 DIAGNOSIS — F411 Generalized anxiety disorder: Secondary | ICD-10-CM

## 2018-06-23 DIAGNOSIS — M25561 Pain in right knee: Secondary | ICD-10-CM | POA: Diagnosis not present

## 2018-06-23 DIAGNOSIS — M79605 Pain in left leg: Secondary | ICD-10-CM

## 2018-06-23 DIAGNOSIS — M47816 Spondylosis without myelopathy or radiculopathy, lumbar region: Secondary | ICD-10-CM

## 2018-06-23 DIAGNOSIS — G8929 Other chronic pain: Secondary | ICD-10-CM | POA: Diagnosis not present

## 2018-06-23 DIAGNOSIS — M79604 Pain in right leg: Secondary | ICD-10-CM

## 2018-06-23 DIAGNOSIS — G894 Chronic pain syndrome: Secondary | ICD-10-CM

## 2018-06-23 DIAGNOSIS — H5316 Psychophysical visual disturbances: Secondary | ICD-10-CM | POA: Diagnosis not present

## 2018-06-23 DIAGNOSIS — F329 Major depressive disorder, single episode, unspecified: Secondary | ICD-10-CM

## 2018-06-23 DIAGNOSIS — M7918 Myalgia, other site: Secondary | ICD-10-CM

## 2018-06-23 DIAGNOSIS — M25562 Pain in left knee: Secondary | ICD-10-CM

## 2018-06-23 DIAGNOSIS — F32A Depression, unspecified: Secondary | ICD-10-CM

## 2018-06-23 MED ORDER — TRAMADOL HCL 50 MG PO TABS: 50.0000 mg | ORAL_TABLET | Freq: Three times a day (TID) | ORAL | 1 refills | Status: DC | PRN

## 2018-06-23 NOTE — Progress Notes (Signed)
Pain Management Virtual Encounter Note - Virtual Visit via Telephone Telehealth (real-time audio visits between healthcare provider and patient).  Patient's Phone No. & Preferred Pharmacy:  [phone] (home); There is no such number on file (mobile).; (Preferred) [phone] No e-mail address on record  [place] [place] [name] [address] [place] [address] Phone: [phone] Fax: [phone]   Pre-screening note:  Our staff contacted Sandra [name] and offered her an "in person", "face-to-face" appointment versus a telephone encounter. She indicated preferring the telephone encounter, at this time.  Reason for Virtual Visit: COVID-19*  Social distancing based on CDC and AMA recommendations.   I contacted Anaya Bovee on 06/23/2018 at 1:45 PM via telephone and clearly identified myself as Gillis Santa, MD. I verified that I was speaking with the correct person using two identifiers (Name and date of birth: Jul 19, 1955).  Advanced Informed Consent I sought verbal advanced consent from Sandra [name] for virtual visit interactions. I informed Sandra [name] of possible security and privacy concerns, risks, and limitations associated with providing "not-in-person" medical evaluation and management services. I also informed Sandra [name] of the availability of "in-person" appointments. Finally, I informed her that there would be a charge for the virtual visit and that she could be  personally, fully or partially, financially responsible for it. Sandra [name] expressed understanding and agreed to proceed.   Historic Elements   Sandra [name] is a 63 y.o. year old, female patient evaluated today after her last encounter by our practice on 06/10/2018. Sandra [name]  has a past medical history of Anemia, Anxiety (08/01/2014), Arthritis, degenerative (07/30/2013), Clinical depression (06/30/2014), Depression, Elevated liver enzymes, GERD (gastroesophageal reflux  disease), H/O breast biopsy (01/05/2015), H/O: attempted suicide (2013), Hepatitis B, Hiatal hernia, History of hysterectomy (01/05/2015), History of peptic ulcer disease (01/05/2015), Hypercholesteremia, Hypertension, Legally blind, Panic attacks, PUD (peptic ulcer disease), Rectocele (01/05/2015), Retinitis pigmentosa, and S/P cholecystectomy (01/05/2015). She also  has a past surgical history that includes Cholecystectomy; Rectocele repair; Abdominal hysterectomy; Appendectomy; Colonoscopy; Esophagogastroduodenoscopy; Sphincterotomy (N/A, 01/24/2017); Hemorrhoid surgery (N/A, 03/07/2017); Hernia repair; Esophagogastroduodenoscopy (egd) with propofol (N/A, 09/17/2017); Oophorectomy; and Breast biopsy (Bilateral, 1977). Sandra [name] has a current medication list which includes the following prescription(s): acetaminophen, albuterol, amlodipine-benazepril, benzonatate, buspirone, cyclobenzaprine, escitalopram, eszopiclone, fluticasone, gabapentin, hydrochlorothiazide, lansoprazole, melatonin, ondansetron, simvastatin, vitamin b-12, alprazolam, aspirin, lidocaine, and tramadol. She  reports that she quit smoking about 44 years ago. She has never used smokeless tobacco. She reports that she does not drink alcohol or use drugs. Sandra [name] is allergic to acetaminophen-codeine; ambien [zolpidem]; codeine; oxycontin [oxycodone hcl]; trazodone and nefazodone; and vicodin [hydrocodone-acetaminophen].   HPI  I last communicated with her on 06/10/2018. Today, she is being contacted for medication management.  Will take over patient's Tramadol 50 mg TID.  Patient states that she submitted urine for UDS however in epic it states that specimen has not been collected.  We will need to repeat when patient returns to clinic.  Patient will also need to sign opioid contract at that time.  Pharmacotherapy Assessment  Tramadol 50 mg 3 times daily PRN, quantity 90/month; MME equals 15  Patient states that she has 25 tablets  remaining from her previous fill  06/17/2018  6   06/17/2018  Tramadol Hcl 50 MG Tablet  42.00 7 Darene Lamer   [id number]   [place] ([id number])   0  30.00 MME      Monitoring: Pharmacotherapy: No side-effects or adverse reactions reported. [place]  PMP: PDMP reviewed during this encounter.          Compliance: No problems identified or detected. Plan: Refer to "POC".  Review of recent tests  MM 3D SCREEN BREAST BILATERAL CLINICAL DATA:  Screening.  EXAM: DIGITAL SCREENING BILATERAL MAMMOGRAM WITH TOMO AND CAD  COMPARISON:  Previous exam(s).  ACR Breast Density Category c: The breast tissue is heterogeneously dense, which may obscure small masses.  FINDINGS: There are no findings suspicious for malignancy. Images were processed with CAD.  IMPRESSION: No mammographic evidence of malignancy. A result letter of this screening mammogram will be mailed directly to the patient.  RECOMMENDATION: Screening mammogram in one year. (Code:SM-B-01Y)  BI-RADS CATEGORY  1: Negative.  Electronically Signed   By: Lillia Mountain M.D.   On: 02/27/2018 13:01   Admission on 09/17/2017, Discharged on 09/17/2017  Component Date Value Ref Range Status  . SURGICAL PATHOLOGY 09/17/2017    Final                   Value:Surgical Pathology CASE: [id number] PATIENT: Sandra [name] Surgical Pathology Report     SPECIMEN SUBMITTED: A. Stomach, antrum and body R/O H pylori;cbx B. Esophagus, proximal and distal R/O EOE;cbx  CLINICAL HISTORY: None provided  PRE-OPERATIVE DIAGNOSIS: Nausea, Pharyngoesophageal dysphagia  POST-OPERATIVE DIAGNOSIS: None provided.     DIAGNOSIS: A. STOMACH, ANTRUM AND BODY; COLD BIOPSY: - ANTRAL MUCOSA WITH REACTIVE GASTRITIS. - OXYNTIC MUCOSA WITH FOCAL MILD CHRONIC GASTRITIS, NONSPECIFIC. - NEGATIVE FOR H. PYLORI, DYSPLASIA, AND MALIGNANCY.  B.  ESOPHAGUS, PROXIMAL AND DISTAL; COLD BIOPSY: - SQUAMOUS MUCOSA WITH PARAKERATOSIS, SEE COMMENT. - PAS F STAIN NEGATIVE;  STAIN CONTROLS WORKED APPROPRIATELY. - NEGATIVE FOR EOSINOPHILS, DYSPLASIA, AND MALIGNANCY.  Comment: Sections of the esophageal biopsy (specimen B) demonstrates squamous mucosa with focal parakeratosis.  A PAS Stain is negative for fungus/Candida.  Parakeratosis is a nonspecif                         ic finding and can be seen in association with gastroesophageal reflux and sloughing esophagitis. Correlation with clinical impression and radiographic findings is required.   GROSS DESCRIPTION: A. Labeled: Cbx antrum and body stomach rule out H. pylori Received: In formalin Tissue fragment(s): 1 Size: 0.5 cm Description: Pink fragment Entirely submitted in one cassette.  B. Labeled: Cbx proximal and distal esophagus rule out EOE Received: In formalin Tissue fragment(s): Multiple Size: Aggregate, 0.9 x 0.4 x 0.1 cm Description: Wispy gray fragments Entirely submitted in one cassette.    Final Diagnosis performed by Quay Burow, MD.   Electronically signed 09/22/2017 3:25:37PM The electronic signature indicates that the named Attending Pathologist has evaluated the specimen  Technical component performed at [facility], [address] [place], [place] [address] Lab: [phone] Dir: Rush Farmer, MD, MMM  Professional component performed at [facility], [facility], [place], [place], [place] [address] Lab: [phone] Dir: Dellia Nims. Rubinas, MD    Assessment  The primary encounter diagnosis was Chronic lumbar radicular pain (Bilateral). Diagnoses of Chronic pain syndrome, Chronic lower extremity pain (Bilateral), Musculoskeletal pain, Lumbar facet syndrome (Bilateral) (R>L), Chronic bilateral knee pain, Lumbar spondylosis, Lumbar facet arthropathy, Sandra [name] Syndrome (CBS), Cervical spondylosis, Impaired visual perception, Generalized anxiety disorder, and Depressive disorder were also pertinent to this visit.  Plan of Care  I  have discontinued Sandra [name]'s traMADol and traMADol. I have also changed her traMADol. Additionally, I am having her maintain her simvastatin, aspirin, ondansetron, eszopiclone, albuterol, ALPRAZolam, gabapentin, lansoprazole, fluticasone, escitalopram, benzonatate, cyclobenzaprine, vitamin B-12, amlodipine-benazepril, Melatonin, acetaminophen, lidocaine, busPIRone, and hydrochlorothiazide.   This is the patient's second visit (virtual) with me.  Patient states that she came to submit urine however in epic it states that specimen was not collected, we will need to repeat this in the future when patient follows up.  At that time patient will also need to sign opioid contract.  I will take over the patient's chronic opioid regimen of tramadol 50 mg 3 times daily PRN, quantity 90/month; MME equals 15.  Have instructed the patient to continue her other multimodal analgesics including gabapentin 400 mg twice daily and Flexeril 10 mg twice daily as needed.  Of note, I was having difficulty E prescribing the patient's tramadol prescription to her pharmacy.  This was odd since I had E prescribed medication for prior patient's earlier in the day.  After multiple failed attempts of trying to E prescribed, I did call the pharmacy, [facility], with a telephone/verbal read out of the prescription: Tramadol 50 mg, TID prn, #90/month. Fill dates: 5/12 with 1 refill (for 6/12).  Patient will follow back up with me towards the middle of July for medication management at which point we can refill her tramadol.  Patient will need to complete UDS and sign pain contract at that time.  Patient endorsed understanding.  Patient states that she [name] to arrange for public transportation when coming to the hospital.  Although it is customary for all new patients to sign opioid contract and have urine drug screen on file prior to prescribing, given her situation in the context of COVID-19, I will go ahead and prescribe her  tramadol with understanding that she will need to complete a UDS and sign a pain contract with me when she follows up for medication refill.  I informed the patient that she is not to receive any tramadol prescriptions from her primary care provider as I have now taken over her tramadol prescription.  Patient endorsed understanding.  Pharmacotherapy (Medications Ordered): Meds ordered this encounter  Medications  . DISCONTD: traMADol (ULTRAM) 50 MG tablet    Sig: Take 1 tablet (50 mg total) by mouth every 8 (eight) hours as needed for severe pain. Month last 30 days.    Dispense:  90 tablet    Refill:  1    [place] STOP ACT - Not applicable. Fill one day early if pharmacy is closed on scheduled refill date.  . traMADol (ULTRAM) 50 MG tablet    Sig: Take 1 tablet (50 mg total) by mouth every 8 (eight) hours as needed for moderate pain or severe pain.    Dispense:  90 tablet    Refill:  1    [place] STOP ACT - Not applicable. Fill one day early if pharmacy is closed on scheduled refill date.   Orders:  No orders of the defined types were placed in this encounter.  Follow-up plan:   Return in about 9 weeks (around 08/25/2018) for Medication Management (UDS, contract).   I discussed the assessment and treatment plan with the patient. The patient was provided an opportunity to ask questions and all were answered. The patient agreed with the plan and demonstrated an understanding of the instructions.  Patient advised to call back or seek an in-person evaluation if the symptoms or condition worsens.  Total duration of non-face-to-face encounter: 25 minutes.  Note by: Gillis Santa, MD Date: 06/23/2018; Time: 1:45 PM  Disclaimer:  * Given the special circumstances of the COVID-19 pandemic, the federal government has announced that the Office for Civil Rights (OCR) will exercise its enforcement discretion and will not impose penalties on physicians using telehealth in the event of noncompliance with  regulatory requirements under the [place] and Accountability Act (HIPAA) in connection with the good faith provision of telehealth during the WCHJS-43 national public health emergency. ([place])

## 2018-08-18 ENCOUNTER — Ambulatory Visit: Payer: PPO

## 2018-08-20 ENCOUNTER — Ambulatory Visit: Payer: PPO | Attending: Internal Medicine

## 2018-08-20 DIAGNOSIS — R262 Difficulty in walking, not elsewhere classified: Secondary | ICD-10-CM | POA: Insufficient documentation

## 2018-08-20 DIAGNOSIS — M545 Low back pain: Secondary | ICD-10-CM | POA: Insufficient documentation

## 2018-08-20 DIAGNOSIS — G8929 Other chronic pain: Secondary | ICD-10-CM | POA: Insufficient documentation

## 2018-08-20 DIAGNOSIS — M6281 Muscle weakness (generalized): Secondary | ICD-10-CM | POA: Insufficient documentation

## 2018-08-24 ENCOUNTER — Telehealth: Payer: Self-pay

## 2018-08-24 ENCOUNTER — Ambulatory Visit: Payer: PPO

## 2018-08-24 NOTE — Telephone Encounter (Signed)
Called patient to check up on her. Pt states that she is unable to come to her 4:15 pm appointment today due to no transportation. Back and bottom are hurting as well. Might be able to come to PT this Wednesday 08/26/2018 at 3:30 pm if she can find transportation. Will call back to let us know.

## 2018-08-26 ENCOUNTER — Ambulatory Visit: Payer: PPO

## 2018-08-26 ENCOUNTER — Encounter: Payer: Self-pay | Admitting: Student in an Organized Health Care Education/Training Program

## 2018-08-26 ENCOUNTER — Other Ambulatory Visit: Payer: Self-pay

## 2018-08-26 DIAGNOSIS — M545 Low back pain: Secondary | ICD-10-CM | POA: Diagnosis not present

## 2018-08-26 DIAGNOSIS — M6281 Muscle weakness (generalized): Secondary | ICD-10-CM

## 2018-08-26 DIAGNOSIS — R262 Difficulty in walking, not elsewhere classified: Secondary | ICD-10-CM

## 2018-08-26 DIAGNOSIS — G8929 Other chronic pain: Secondary | ICD-10-CM
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
## 2018-08-27 ENCOUNTER — Ambulatory Visit
Payer: PPO | Attending: Student in an Organized Health Care Education/Training Program | Admitting: Student in an Organized Health Care Education/Training Program

## 2018-08-27 ENCOUNTER — Encounter: Payer: Self-pay | Admitting: Student in an Organized Health Care Education/Training Program

## 2018-08-27 DIAGNOSIS — M25562 Pain in left knee: Secondary | ICD-10-CM

## 2018-08-27 DIAGNOSIS — M79604 Pain in right leg: Secondary | ICD-10-CM

## 2018-08-27 DIAGNOSIS — M7918 Myalgia, other site: Secondary | ICD-10-CM

## 2018-08-27 DIAGNOSIS — M5416 Radiculopathy, lumbar region: Secondary | ICD-10-CM | POA: Diagnosis not present

## 2018-08-27 DIAGNOSIS — M25561 Pain in right knee: Secondary | ICD-10-CM | POA: Diagnosis not present

## 2018-08-27 DIAGNOSIS — G8929 Other chronic pain: Secondary | ICD-10-CM

## 2018-08-27 DIAGNOSIS — G894 Chronic pain syndrome: Secondary | ICD-10-CM | POA: Diagnosis not present

## 2018-08-27 DIAGNOSIS — M47812 Spondylosis without myelopathy or radiculopathy, cervical region: Secondary | ICD-10-CM | POA: Diagnosis not present

## 2018-08-27 DIAGNOSIS — H539 Unspecified visual disturbance: Secondary | ICD-10-CM

## 2018-08-27 DIAGNOSIS — H5316 Psychophysical visual disturbances: Secondary | ICD-10-CM

## 2018-08-27 DIAGNOSIS — M47816 Spondylosis without myelopathy or radiculopathy, lumbar region: Secondary | ICD-10-CM | POA: Diagnosis not present

## 2018-08-27 DIAGNOSIS — M79605 Pain in left leg: Secondary | ICD-10-CM | POA: Diagnosis not present

## 2018-08-27 MED ORDER — TRAMADOL HCL 50 MG PO TABS: 50.0000 mg | ORAL_TABLET | Freq: Two times a day (BID) | ORAL | 2 refills | Status: DC | PRN

## 2018-08-27 MED ORDER — CYCLOBENZAPRINE HCL 10 MG PO TABS: 10.0000 mg | ORAL_TABLET | Freq: Three times a day (TID) | ORAL | 2 refills | Status: DC | PRN

## 2018-08-27 NOTE — Progress Notes (Signed)
Pain Management Virtual Encounter Note - Virtual Visit via Telephone Telehealth (real-time audio visits between healthcare provider and patient).   Patient's Phone No. & Preferred Pharmacy:  [phone] (home); There is no such number on file (mobile).; (Preferred) [phone] No e-mail address on record  [place] [place] [address] [place] [address] Phone: [phone] Fax: [phone]    Pre-screening note:  Our staff contacted Sandra [name] and offered her an "in person", "face-to-face" appointment versus a telephone encounter. She indicated preferring the telephone encounter, at this time.   Reason for Virtual Visit: COVID-19*  Social distancing based on CDC and AMA recommendations.   I contacted Sandra [name] on 08/27/2018 via telephone.      I clearly identified myself as Gillis Santa, MD. I verified that I was speaking with the correct person using two identifiers (Name: Sandra [name], and date of birth: 03/28/1955).  Advanced Informed Consent I sought verbal advanced consent from Sandra [name] for virtual visit interactions. I informed Sandra [name] of possible security and privacy concerns, risks, and limitations associated with providing "not-in-person" medical evaluation and management services. I also informed Sandra [name] of the availability of "in-person" appointments. Finally, I informed her that there would be a charge for the virtual visit and that she could be  personally, fully or partially, financially responsible for it. Sandra [name] expressed understanding and agreed to proceed.   Historic Elements   Sandra [name] is a 63 y.o. year old, female patient evaluated today after her last encounter by our practice on 06/23/2018. Sandra [name]  has a past medical history of Anemia, Anxiety (08/01/2014), Arthritis, degenerative (07/30/2013), Clinical depression (06/30/2014), Depression, Elevated liver enzymes, GERD  (gastroesophageal reflux disease), H/O breast biopsy (01/05/2015), H/O: attempted suicide (2013), Hepatitis B, Hiatal hernia, History of hysterectomy (01/05/2015), History of peptic ulcer disease (01/05/2015), Hypercholesteremia, Hypertension, Legally blind, Panic attacks, PUD (peptic ulcer disease), Rectocele (01/05/2015), Retinitis pigmentosa, and S/P cholecystectomy (01/05/2015). She also  has a past surgical history that includes Cholecystectomy; Rectocele repair; Abdominal hysterectomy; Appendectomy; Colonoscopy; Esophagogastroduodenoscopy; Sphincterotomy (N/A, 01/24/2017); Hemorrhoid surgery (N/A, 03/07/2017); Hernia repair; Esophagogastroduodenoscopy (egd) with propofol (N/A, 09/17/2017); Oophorectomy; and Breast biopsy (Bilateral, 1977). Sandra [name] has a current medication list which includes the following prescription(s): acetaminophen, albuterol, amlodipine-benazepril, buspirone, cyclobenzaprine, escitalopram, eszopiclone, fluticasone, gabapentin, hydrochlorothiazide, lansoprazole, melatonin, montelukast, ondansetron, simvastatin, tramadol, alprazolam, aspirin, benzonatate, lidocaine, and vitamin b-12. She  reports that she quit smoking about 45 years ago. She has never used smokeless tobacco. She reports that she does not drink alcohol or use drugs. Sandra [name] is allergic to trazodone; acetaminophen-codeine; codeine; oxycontin [oxycodone hcl]; trazodone and nefazodone; vicodin [hydrocodone-acetaminophen]; zolpidem; hydrocodone-acetaminophen; and oxycodone-acetaminophen.   HPI  Today, she is being contacted for medication management.  Patient is endorsing increased pain in her low back and bilateral legs.  Patient has seen her primary care provider for increased lower extremity swelling.  She states that she is keeping her legs elevated.  She has not been able to get compression stockings yet.  She states that her current dose of tramadol is not effective in managing her pain.  She states that  sometimes she does have to take 2 tablets at once to to find any benefit.  We discussed modifying her dosing regimen to that she is taking no more than 2 tablets twice [name].  Patient can take 100 mg twice [name] as needed to help manage her pain.  We will also refill her Flexeril  as below.  Pharmacotherapy Assessment   07/30/2018  6   06/23/2018  Tramadol Hcl 50 MG Tablet  90.00 30 Bi Lat   [id number]   [place] (3500)   1  15.00 MME  Medicare     Monitoring: Pharmacotherapy: No side-effects or adverse reactions reported. Sandra PMP: PDMP reviewed during this encounter.       Compliance: No problems identified. Effectiveness: Clinically acceptable. Plan: Refer to "POC". Increase Tramadol from #90 to #120 given sub-optimal pain management on current regimen  Pertinent Labs   SAFETY SCREENING Profile No results found for: SARSCOV2NAA, COVIDSOURCE, STAPHAUREUS, MRSAPCR, HCVAB, HIV, PREGTESTUR Renal Function Lab Results  Component Value Date   BUN 18 01/16/2017   CREATININE 0.75 01/16/2017   GFRAA >60 01/16/2017   GFRNONAA >60 01/16/2017   Hepatic Function Lab Results  Component Value Date   AST 24 05/13/2015   ALT 13 (L) 05/13/2015   ALBUMIN 4.5 05/13/2015   UDS No results found for: SUMMARY Note: Above Lab results reviewed.  Recent imaging  MM 3D SCREEN BREAST BILATERAL CLINICAL DATA:  Screening.  EXAM: DIGITAL SCREENING BILATERAL MAMMOGRAM WITH TOMO AND CAD  COMPARISON:  Previous exam(s).  ACR Breast Density Category c: The breast tissue is heterogeneously dense, which may obscure small masses.  FINDINGS: There are no findings suspicious for malignancy. Images were processed with CAD.  IMPRESSION: No mammographic evidence of malignancy. A result letter of this screening mammogram will be mailed directly to the patient.  RECOMMENDATION: Screening mammogram in one year. (Code:SM-B-01Y)  BI-RADS CATEGORY  1: Negative.  Electronically Signed   By: Lillia [place] M.D.   On:  02/27/2018 13:01  Assessment  The primary encounter diagnosis was Chronic pain syndrome. Diagnoses of Chronic lumbar radicular pain (Bilateral), Chronic lower extremity pain (Bilateral), Musculoskeletal pain, Lumbar facet syndrome (Bilateral) (R>L), Chronic bilateral knee pain, Lumbar spondylosis, Lumbar facet arthropathy, [name] (CBS), Cervical spondylosis, and Impaired visual perception were also pertinent to this visit.  Plan of Care  I have changed Lema Creger's cyclobenzaprine. I am also having her maintain her simvastatin, aspirin, ondansetron, eszopiclone, albuterol, ALPRAZolam, gabapentin, lansoprazole, fluticasone, escitalopram, benzonatate, vitamin B-12, amlodipine-benazepril, Melatonin, acetaminophen, lidocaine, busPIRone, hydrochlorothiazide, montelukast, and traMADol.  Pharmacotherapy (Medications Ordered): Meds ordered this encounter  Medications  . cyclobenzaprine (FLEXERIL) 10 MG tablet    Sig: Take 1 tablet (10 mg total) by mouth 3 (three) times [name] as needed for muscle spasms.    Dispense:  90 tablet    Refill:  2  . DISCONTD: traMADol (ULTRAM) 50 MG tablet    Sig: Take 1-2 tablets (50-100 mg total) by mouth every 12 (twelve) hours as needed for moderate pain or severe pain.    Dispense:  120 tablet    Refill:  2    [place] STOP ACT - Not applicable. Fill one day early if pharmacy is closed on scheduled refill date.  . traMADol (ULTRAM) 50 MG tablet    Sig: Take 1-2 tablets (50-100 mg total) by mouth every 12 (twelve) hours as needed for moderate pain or severe pain.    Dispense:  120 tablet    Refill:  2    [place] STOP ACT - Not applicable. Fill one day early if pharmacy is closed on scheduled refill date.   Follow-up plan:   Return in about 11 weeks (around 11/12/2018) for Medication Management.    Recent Visits Date Type Provider Dept  06/23/18 Office Visit Gillis Santa, [facility]  06/10/18 Office Visit  Gillis Santa, MD [facility]  Showing recent visits within past 90 days and meeting all other requirements   Today's Visits Date Type Provider Dept  08/27/18 Office Visit Gillis Santa, MD [facility]  Showing today's visits and meeting all other requirements   Future Appointments No visits were found meeting these conditions.  Showing future appointments within next 90 days and meeting all other requirements   I discussed the assessment and treatment plan with the patient. The patient was provided an opportunity to ask questions and all were answered. The patient agreed with the plan and demonstrated an understanding of the instructions.  Patient advised to call back or seek an in-person evaluation if the symptoms or condition worsens.  Total duration of non-face-to-face encounter: 42minutes.  Note by: Gillis Santa, MD Date: 08/27/2018; Time: 2:05 PM  Note: This dictation was prepared with Dragon dictation. Any transcriptional errors that may result from this process are unintentional.  Disclaimer:  * Given the special circumstances of the COVID-19 pandemic, the federal government has announced that the Office for Civil Rights (OCR) will exercise its enforcement discretion and will not impose penalties on physicians using telehealth in the event of noncompliance with regulatory requirements under the [place] (HIPAA) in connection with the good faith provision of telehealth during the UJWJX-91 national public health emergency. ([place])

## 2018-08-31 ENCOUNTER — Ambulatory Visit: Payer: PPO

## 2018-09-02 ENCOUNTER — Ambulatory Visit: Payer: PPO

## 2018-09-07 ENCOUNTER — Ambulatory Visit: Payer: PPO

## 2018-09-09 ENCOUNTER — Ambulatory Visit: Payer: PPO

## 2018-09-14 ENCOUNTER — Ambulatory Visit: Payer: PPO

## 2018-09-16 ENCOUNTER — Ambulatory Visit: Payer: PPO

## 2018-09-28 DIAGNOSIS — M545 Low back pain: Secondary | ICD-10-CM | POA: Diagnosis not present

## 2018-09-28 DIAGNOSIS — R05 Cough: Secondary | ICD-10-CM | POA: Diagnosis not present

## 2018-09-28 DIAGNOSIS — R600 Localized edema: Secondary | ICD-10-CM | POA: Diagnosis not present

## 2018-09-28 DIAGNOSIS — R5383 Other fatigue: Secondary | ICD-10-CM | POA: Diagnosis not present

## 2018-09-28 DIAGNOSIS — R7989 Other specified abnormal findings of blood chemistry: Secondary | ICD-10-CM | POA: Diagnosis not present

## 2018-09-28 DIAGNOSIS — M47816 Spondylosis without myelopathy or radiculopathy, lumbar region: Secondary | ICD-10-CM | POA: Diagnosis not present

## 2018-09-29 ENCOUNTER — Other Ambulatory Visit: Payer: Self-pay | Admitting: Physician Assistant

## 2018-09-29 DIAGNOSIS — R05 Cough: Secondary | ICD-10-CM

## 2018-09-29 DIAGNOSIS — R053 Chronic cough: Secondary | ICD-10-CM

## 2018-09-29 DIAGNOSIS — R7989 Other specified abnormal findings of blood chemistry: Secondary | ICD-10-CM

## 2018-09-30 ENCOUNTER — Emergency Department: Payer: PPO

## 2018-09-30 ENCOUNTER — Other Ambulatory Visit: Payer: Self-pay

## 2018-09-30 ENCOUNTER — Encounter: Payer: Self-pay | Admitting: Emergency Medicine

## 2018-09-30 ENCOUNTER — Emergency Department
Admission: EM | Admit: 2018-09-30 | Discharge: 2018-10-01 | Disposition: A | Discharge status: Home / Self Care | Payer: PPO | Attending: Emergency Medicine | Admitting: Emergency Medicine

## 2018-09-30 DIAGNOSIS — I10 Essential (primary) hypertension: Secondary | ICD-10-CM | POA: Insufficient documentation

## 2018-09-30 DIAGNOSIS — Z87891 Personal history of nicotine dependence: Secondary | ICD-10-CM | POA: Diagnosis not present

## 2018-09-30 DIAGNOSIS — H548 Legal blindness, as defined in USA: Secondary | ICD-10-CM | POA: Diagnosis not present

## 2018-09-30 DIAGNOSIS — Z79899 Other long term (current) drug therapy: Secondary | ICD-10-CM | POA: Insufficient documentation

## 2018-09-30 DIAGNOSIS — M79605 Pain in left leg: Secondary | ICD-10-CM | POA: Diagnosis not present

## 2018-09-30 DIAGNOSIS — R42 Dizziness and giddiness: Secondary | ICD-10-CM | POA: Insufficient documentation

## 2018-09-30 DIAGNOSIS — Z7982 Long term (current) use of aspirin: Secondary | ICD-10-CM | POA: Diagnosis not present

## 2018-09-30 DIAGNOSIS — M25552 Pain in left hip: Secondary | ICD-10-CM | POA: Insufficient documentation

## 2018-09-30 DIAGNOSIS — R52 Pain, unspecified: Secondary | ICD-10-CM | POA: Diagnosis not present

## 2018-09-30 DIAGNOSIS — W19XXXA Unspecified fall, initial encounter: Secondary | ICD-10-CM | POA: Diagnosis not present

## 2018-09-30 DIAGNOSIS — S79912A Unspecified injury of left hip, initial encounter: Secondary | ICD-10-CM | POA: Diagnosis not present

## 2018-09-30 DIAGNOSIS — W1839XA Other fall on same level, initial encounter: Secondary | ICD-10-CM | POA: Insufficient documentation

## 2018-09-30 DIAGNOSIS — R0902 Hypoxemia: Secondary | ICD-10-CM | POA: Diagnosis not present

## 2018-09-30 MED ORDER — ONDANSETRON 8 MG PO TBDP
8.0000 mg | ORAL_TABLET | Freq: Once | ORAL | Status: AC
  Administered 2018-09-30: 8 mg via ORAL
  Filled 2018-09-30: qty 1

## 2018-09-30 MED ORDER — OXYCODONE-ACETAMINOPHEN 5-325 MG PO TABS
1.0000 | ORAL_TABLET | Freq: Once | ORAL | Status: AC
  Administered 2018-09-30: 1 via ORAL
  Filled 2018-09-30: qty 1

## 2018-09-30 NOTE — ED Notes (Signed)
Patient transported to X-ray 

## 2018-09-30 NOTE — ED Triage Notes (Signed)
PT states she is here due to left leg pain. No shortening or rotation visible in triage. No deformity/discoloration. Pt states she fell Friday and was seen by her PCP Monday. XRAYS were negative for fracture. Pt states she was given tramadol but "it isn't working." Pt blind and attributes that to her recent falls. Pt denies new falls today.
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
## 2018-10-01 ENCOUNTER — Telehealth: Payer: Self-pay | Admitting: *Deleted

## 2018-10-01 MED ORDER — MECLIZINE HCL 25 MG PO TABS: 25.0000 mg | ORAL_TABLET | Freq: Three times a day (TID) | ORAL | 1 refills | Status: DC | PRN

## 2018-10-01 MED ORDER — PREDNISONE 50 MG PO TABS: 50.0000 mg | ORAL_TABLET | Freq: Every day | ORAL | 0 refills | Status: DC

## 2018-10-01 MED ORDER — METHOCARBAMOL 500 MG PO TABS: 500.0000 mg | ORAL_TABLET | Freq: Four times a day (QID) | ORAL | 0 refills | Status: DC

## 2018-10-01 NOTE — Telephone Encounter (Signed)
Dr. Holley Raring,                 Please advise on this patient request for pain medication. Please see ED note from yesterday. She is already on Tramadol from you. She was also given scripts in the ED yesterday. I have not contacted her yet but just wanted to make sure that you were not going to give her any new medications before I do.

## 2018-10-01 NOTE — Telephone Encounter (Signed)
Called patient and explained the reason for no new pain meds(vertigo, falls, age). She understands and is going to start the medications from the ED today - steroid, muscle relaxer, and vertigo medication. Instructed to call us if needed.

## 2018-10-08 ENCOUNTER — Other Ambulatory Visit: Payer: Self-pay

## 2018-10-08 ENCOUNTER — Ambulatory Visit
Admission: RE | Admit: 2018-10-08 | Discharge: 2018-10-08 | Disposition: A | Discharge status: Home / Self Care | Payer: PPO | Source: Ambulatory Visit | Attending: Physician Assistant | Admitting: Physician Assistant

## 2018-10-08 DIAGNOSIS — K760 Fatty (change of) liver, not elsewhere classified: Secondary | ICD-10-CM | POA: Diagnosis not present

## 2018-10-08 DIAGNOSIS — R7989 Other specified abnormal findings of blood chemistry: Secondary | ICD-10-CM

## 2018-10-08 DIAGNOSIS — R05 Cough: Secondary | ICD-10-CM | POA: Insufficient documentation

## 2018-10-08 DIAGNOSIS — R053 Chronic cough: Secondary | ICD-10-CM

## 2018-10-08 DIAGNOSIS — K838 Other specified diseases of biliary tract: Secondary | ICD-10-CM | POA: Diagnosis not present

## 2018-10-08 DIAGNOSIS — R945 Abnormal results of liver function studies: Secondary | ICD-10-CM | POA: Diagnosis not present

## 2018-10-08 NOTE — Patient Outreach (Signed)
[facility]) Care Management  10/08/2018  Linnell Swords 1955/03/31 [id number]   [facility] referral received on 10/07/18 to assist patient with in-home resources as patient is unable to afford [facility].   Successful outreach to patient today.  Explained social work/case management services and reason for call.  Patient stated "they want me to get some help in the home because I fell and cannot afford a facility"   Discussed personal care services with patient, however, these services are only covered by Medicaid.  Explained this to patient and informed her that in-home aide services would have to be paid for out-of-pocket because they are not covered by Medicare.  Patient was already aware of this and stated that she cannot afford to privately pay for services.  Patient is technically over the income limit for Medicaid eligibility but she was encouraged to contact [place] to further discuss this with a caseworker.    Patient did say that she has support from her daughter with transportation, food, etc.  She also has a medical alert system.  Unfortunately, I am unable to provide patient with resource for affordable in-home services.   Ronn Melena, BSW Social Worker [phone]

## 2018-10-12 DIAGNOSIS — R7989 Other specified abnormal findings of blood chemistry: Secondary | ICD-10-CM | POA: Diagnosis not present

## 2018-11-02 DIAGNOSIS — Z0000 Encounter for general adult medical examination without abnormal findings: Secondary | ICD-10-CM | POA: Diagnosis not present

## 2018-11-02 DIAGNOSIS — F321 Major depressive disorder, single episode, moderate: Secondary | ICD-10-CM | POA: Diagnosis not present

## 2018-11-02 DIAGNOSIS — E7800 Pure hypercholesterolemia, unspecified: Secondary | ICD-10-CM | POA: Diagnosis not present

## 2018-11-02 DIAGNOSIS — H6123 Impacted cerumen, bilateral: Secondary | ICD-10-CM | POA: Diagnosis not present

## 2018-11-02 DIAGNOSIS — K219 Gastro-esophageal reflux disease without esophagitis: Secondary | ICD-10-CM | POA: Diagnosis not present

## 2018-11-02 DIAGNOSIS — I10 Essential (primary) hypertension: Secondary | ICD-10-CM | POA: Diagnosis not present

## 2018-11-13 DIAGNOSIS — H6123 Impacted cerumen, bilateral: Secondary | ICD-10-CM | POA: Diagnosis not present

## 2018-11-13 DIAGNOSIS — H903 Sensorineural hearing loss, bilateral: Secondary | ICD-10-CM | POA: Diagnosis not present

## 2018-11-13 DIAGNOSIS — R42 Dizziness and giddiness: Secondary | ICD-10-CM | POA: Diagnosis not present

## 2018-11-23 ENCOUNTER — Encounter: Payer: Self-pay | Admitting: Student in an Organized Health Care Education/Training Program

## 2018-11-23 ENCOUNTER — Telehealth: Payer: Self-pay | Admitting: Student in an Organized Health Care Education/Training Program

## 2018-11-23 ENCOUNTER — Ambulatory Visit
Payer: PPO | Attending: Student in an Organized Health Care Education/Training Program | Admitting: Student in an Organized Health Care Education/Training Program

## 2018-11-23 ENCOUNTER — Other Ambulatory Visit: Payer: Self-pay

## 2018-11-23 DIAGNOSIS — G894 Chronic pain syndrome: Secondary | ICD-10-CM | POA: Diagnosis not present

## 2018-11-23 DIAGNOSIS — M79604 Pain in right leg: Secondary | ICD-10-CM | POA: Diagnosis not present

## 2018-11-23 DIAGNOSIS — G8929 Other chronic pain: Secondary | ICD-10-CM

## 2018-11-23 DIAGNOSIS — M47816 Spondylosis without myelopathy or radiculopathy, lumbar region: Secondary | ICD-10-CM

## 2018-11-23 DIAGNOSIS — H539 Unspecified visual disturbance: Secondary | ICD-10-CM

## 2018-11-23 DIAGNOSIS — M79605 Pain in left leg: Secondary | ICD-10-CM | POA: Diagnosis not present

## 2018-11-23 DIAGNOSIS — H5316 Psychophysical visual disturbances: Secondary | ICD-10-CM

## 2018-11-23 DIAGNOSIS — M47812 Spondylosis without myelopathy or radiculopathy, cervical region: Secondary | ICD-10-CM | POA: Diagnosis not present

## 2018-11-23 DIAGNOSIS — M5416 Radiculopathy, lumbar region: Secondary | ICD-10-CM | POA: Diagnosis not present

## 2018-11-23 MED ORDER — TRAMADOL HCL 50 MG PO TABS: 50.0000 mg | ORAL_TABLET | Freq: Two times a day (BID) | ORAL | 2 refills | Status: AC | PRN

## 2018-11-23 NOTE — Telephone Encounter (Signed)
Spoke with patient and she had a fall in August,  She was seen at the hospital where xrays were taken and she reports they did give her some percocet, unsure amount.  However, she is now out of her tramadol which was to last until 11/25/18.  She states she has not been doing well and needs additional pain medications.  I told her that she may need an assessment in the office and that I would need to convey this information to Dr Holley Raring to see what he might say.

## 2018-11-23 NOTE — Progress Notes (Signed)
Pain Management Virtual Encounter Note - Virtual Visit via Telephone Telehealth (real-time audio visits between healthcare provider and patient).   Patient's Phone No. & Preferred Pharmacy:  [phone] (home); There is no such number on file (mobile).; (Preferred) [phone] No e-mail address on record  [facility] [place] [address] [place] [address] Phone: [phone] Fax: [phone]    Pre-screening note:  Our staff contacted Sandra [name] and offered her an "in person", "face-to-face" appointment versus a telephone encounter. She indicated preferring the telephone encounter, at this time.   Reason for Virtual Visit: COVID-19*  Social distancing based on CDC and AMA recommendations.   I contacted Sandra [name] on 11/23/2018 via telephone.      I clearly identified myself as Gillis Santa, MD. I verified that I was speaking with the correct person using two identifiers (Name: Sandra [name], and date of birth: June 03, 1955).  Advanced Informed Consent I sought verbal advanced consent from Sandra [name] for virtual visit interactions. I informed Sandra [name] of possible security and privacy concerns, risks, and limitations associated with providing "not-in-person" medical evaluation and management services. I also informed Sandra [name] of the availability of "in-person" appointments. Finally, I informed her that there would be a charge for the virtual visit and that she could be  personally, fully or partially, financially responsible for it. Sandra [name] expressed understanding and agreed to proceed.   Historic Elements   Sandra [name] is a 63 y.o. year old, female patient evaluated today after her last encounter by our practice on 11/23/2018. Sandra [name]  has a past medical history of Anemia, Anxiety (08/01/2014), Arthritis, degenerative (07/30/2013), Clinical depression (06/30/2014), Depression, Elevated liver enzymes, GERD  (gastroesophageal reflux disease), H/O breast biopsy (01/05/2015), H/O: attempted suicide (2013), Hepatitis B, Hiatal hernia, History of hysterectomy (01/05/2015), History of peptic ulcer disease (01/05/2015), Hypercholesteremia, Hypertension, Legally blind, Panic attacks, PUD (peptic ulcer disease), Rectocele (01/05/2015), Retinitis pigmentosa, and S/P cholecystectomy (01/05/2015). She also  has a past surgical history that includes Cholecystectomy; Rectocele repair; Abdominal hysterectomy; Appendectomy; Colonoscopy; Esophagogastroduodenoscopy; Sphincterotomy (N/A, 01/24/2017); Hemorrhoid surgery (N/A, 03/07/2017); Hernia repair; Esophagogastroduodenoscopy (egd) with propofol (N/A, 09/17/2017); Oophorectomy; and Breast biopsy (Bilateral, 1977). Sandra [name] has a current medication list which includes the following prescription(s): acetaminophen, albuterol, alprazolam, amlodipine-benazepril, aspirin, benzonatate, buspirone, cyclobenzaprine, escitalopram, eszopiclone, fluticasone, gabapentin, hydrochlorothiazide, lansoprazole, levocetirizine, lidocaine, meclizine, melatonin, montelukast, ondansetron, simvastatin, temazepam, tramadol, and vitamin b-12. She  reports that she quit smoking about 45 years ago. She has never used smokeless tobacco. She reports that she does not drink alcohol or use drugs. Sandra [name] is allergic to trazodone; acetaminophen-codeine; codeine; oxycontin [oxycodone hcl]; trazodone and nefazodone; vicodin [hydrocodone-acetaminophen]; zolpidem; hydrocodone-acetaminophen; and oxycodone-acetaminophen.   HPI  Today, she is being contacted for medication management.   Patient fell early August 8/12, is still having increasing pain in her left hip.  States that she has been utilizing more tramadol to help manage her pain.  Otherwise continues her multimodal analgesics as prescribed.  Pharmacotherapy Assessment  Analgesic:  10/23/2018  4   08/27/2018  Tramadol Hcl 50 MG Tablet  120.00  30 Bi  Lat   [id number]   [place] ([id number])   2  20.00 MME  Medicare   [place]    Monitoring: Pharmacotherapy: No side-effects or adverse reactions reported. [place] PMP: PDMP reviewed during this encounter.       Compliance: No problems identified. Effectiveness: Clinically acceptable. Plan: Refer to "POC".    UDS: No  results found for: SUMMARY need to repeat Laboratory Chemistry Profile (12 mo)  Renal: No results found for requested labs within last 8760 hours.  Lab Results  Component Value Date   GFRAA >60 01/16/2017   GFRNONAA >60 01/16/2017   Hepatic: No results found for requested labs within last 8760 hours. Lab Results  Component Value Date   AST 24 05/13/2015   ALT 13 (L) 05/13/2015   Other: No results found for requested labs within last 8760 hours. Note: Above Lab results reviewed.  Imaging  Last 90 days:  Dg Hip Unilat W Or Wo Pelvis 2-3 Views Left  Result Date: 09/30/2018 CLINICAL DATA:  Hip pain from fall EXAM: DG HIP (WITH OR WITHOUT PELVIS) 2-3V LEFT COMPARISON:  None. FINDINGS: No definite fracture or dislocation. Mild bilateral hip osteoarthritis is seen. Sacroiliac joints appear intact. IMPRESSION: No acute osseous injury. If there is high clinical suspicion for occult hip fracture or the patient refuses to weightbear, consider further evaluation with MRI. Although CT is expeditious, evidence is lacking regarding accuracy of CT over plain film radiography. Electronically Signed   By: Prudencio Pair M.D.   On: 09/30/2018 23:52   US Abdomen Limited Ruq  Result Date: 10/08/2018 CLINICAL DATA:  Elevated LFTs EXAM: ULTRASOUND ABDOMEN LIMITED RIGHT UPPER QUADRANT COMPARISON:  12/25/2017 FINDINGS: Gallbladder: Prior cholecystectomy Common bile duct: Diameter: Dilated, 11 mm. The distal duct is not visualized. This is increased since prior study when this measured 5 mm. Liver: Increased echotexture compatible with fatty infiltration. No focal abnormality or biliary ductal dilatation. Portal vein is  patent on color Doppler imaging with normal direction of blood flow towards the liver. Other: None. IMPRESSION: Mild fatty infiltration of the liver. Prior cholecystectomy. Dilated common bile duct, new since prior study. This may reflect post cholecystectomy state, but cannot exclude distal obstructing mass or stone. If further evaluation is felt warranted, MRCP may be beneficial. Electronically Signed   By: Rolm Baptise M.D.   On: 10/08/2018 14:15    Assessment  The primary encounter diagnosis was Chronic pain syndrome. Diagnoses of Chronic lumbar radicular pain (Bilateral), Chronic lower extremity pain (Bilateral), Lumbar facet syndrome (Bilateral) (R>L), Lumbar spondylosis, Lumbar facet arthropathy, Sandra Needs Syndrome (CBS), Cervical spondylosis, Impaired visual perception, and Encounter for chronic pain management were also pertinent to this visit.  Plan of Care  I have discontinued Pattiann Gullion's predniSONE and methocarbamol. I am also having her maintain her simvastatin, aspirin, ondansetron, eszopiclone, albuterol, ALPRAZolam, gabapentin, lansoprazole, fluticasone, escitalopram, benzonatate, vitamin B-12, amlodipine-benazepril, Melatonin, acetaminophen, lidocaine, busPIRone, hydrochlorothiazide, montelukast, cyclobenzaprine, meclizine, and traMADol.  Pharmacotherapy (Medications Ordered): Meds ordered this encounter  Medications  . traMADol (ULTRAM) 50 MG tablet    Sig: Take 1-2 tablets (50-100 mg total) by mouth every 12 (twelve) hours as needed for moderate pain or severe pain.    Dispense:  120 tablet    Refill:  2    [place] STOP ACT - Not applicable. Fill one day early if pharmacy is closed on scheduled refill date.   Orders:  Orders Placed This Encounter  Procedures  . ToxASSURE Select 13 (MW), Urine    Volume: 30 ml(s). Minimum 3 ml of urine is needed. Document temperature of fresh sample. Indications: Long term (current) use of opiate analgesic [id number])   Follow-up plan:    Return in about 3 months (around 02/23/2019) for Medication Management, virtual.    Recent Visits Date Type Provider Dept  08/27/18 Office Visit Gillis Santa, MD [facility]  Showing  recent visits within past 90 days and meeting all other requirements   Today's Visits Date Type Provider Dept  11/23/18 Office Visit Gillis Santa, MD [facility]  Showing today's visits and meeting all other requirements   Future Appointments No visits were found meeting these conditions.  Showing future appointments within next 90 days and meeting all other requirements   I discussed the assessment and treatment plan with the patient. The patient was provided an opportunity to ask questions and all were answered. The patient agreed with the plan and demonstrated an understanding of the instructions.  Patient advised to call back or seek an in-person evaluation if the symptoms or condition worsens.  Total duration of non-face-to-face encounter: 15 minutes.  Note by: Gillis Santa, MD Date: 11/23/2018; Time: 10:07 AM  Note: This dictation was prepared with Dragon dictation. Any transcriptional errors that may result from this process are unintentional.  Disclaimer:  * Given the special circumstances of the COVID-19 pandemic, the federal government has announced that the Office for Civil Rights (OCR) will exercise its enforcement discretion and will not impose penalties on physicians using telehealth in the event of noncompliance with regulatory requirements under the [place] (HIPAA) in connection with the good faith provision of telehealth during the XX123456 national public health emergency. ([place])

## 2018-11-23 NOTE — Telephone Encounter (Signed)
Has been in a lot of extra pain since fall in August. Only has 1 pain tab left. Wants to see if she can get meds called in she has appt on 11-26-18

## 2018-11-24 ENCOUNTER — Other Ambulatory Visit: Payer: Self-pay | Admitting: Gastroenterology

## 2018-11-24 ENCOUNTER — Other Ambulatory Visit (HOSPITAL_COMMUNITY): Payer: Self-pay | Admitting: Gastroenterology

## 2018-11-24 DIAGNOSIS — K6289 Other specified diseases of anus and rectum: Secondary | ICD-10-CM | POA: Diagnosis not present

## 2018-11-24 DIAGNOSIS — R1013 Epigastric pain: Secondary | ICD-10-CM | POA: Diagnosis not present

## 2018-11-24 DIAGNOSIS — G894 Chronic pain syndrome: Secondary | ICD-10-CM | POA: Diagnosis not present

## 2018-11-24 DIAGNOSIS — F329 Major depressive disorder, single episode, unspecified: Secondary | ICD-10-CM | POA: Diagnosis not present

## 2018-11-24 DIAGNOSIS — R140 Abdominal distension (gaseous): Secondary | ICD-10-CM | POA: Diagnosis not present

## 2018-11-24 DIAGNOSIS — K2950 Unspecified chronic gastritis without bleeding: Secondary | ICD-10-CM | POA: Diagnosis not present

## 2018-11-24 DIAGNOSIS — Z9049 Acquired absence of other specified parts of digestive tract: Secondary | ICD-10-CM | POA: Diagnosis not present

## 2018-11-24 DIAGNOSIS — R748 Abnormal levels of other serum enzymes: Secondary | ICD-10-CM

## 2018-11-24 DIAGNOSIS — R1011 Right upper quadrant pain: Secondary | ICD-10-CM | POA: Diagnosis not present

## 2018-11-24 DIAGNOSIS — K219 Gastro-esophageal reflux disease without esophagitis: Secondary | ICD-10-CM | POA: Diagnosis not present

## 2018-11-24 DIAGNOSIS — R1314 Dysphagia, pharyngoesophageal phase: Secondary | ICD-10-CM | POA: Diagnosis not present

## 2018-11-24 DIAGNOSIS — R350 Frequency of micturition: Secondary | ICD-10-CM | POA: Diagnosis not present

## 2018-11-24 DIAGNOSIS — H548 Legal blindness, as defined in USA: Secondary | ICD-10-CM | POA: Diagnosis not present

## 2018-11-26 ENCOUNTER — Ambulatory Visit (HOSPITAL_BASED_OUTPATIENT_CLINIC_OR_DEPARTMENT_OTHER): Payer: PPO | Admitting: Student in an Organized Health Care Education/Training Program

## 2018-11-26 DIAGNOSIS — G894 Chronic pain syndrome: Secondary | ICD-10-CM

## 2018-11-26 LAB — TOXASSURE SELECT 13 (MW), URINE

## 2018-12-01 ENCOUNTER — Other Ambulatory Visit: Payer: Self-pay | Admitting: Otolaryngology

## 2018-12-01 ENCOUNTER — Other Ambulatory Visit (HOSPITAL_COMMUNITY): Payer: Self-pay | Admitting: Otolaryngology

## 2018-12-01 DIAGNOSIS — R42 Dizziness and giddiness: Secondary | ICD-10-CM

## 2018-12-01 DIAGNOSIS — R509 Fever, unspecified: Secondary | ICD-10-CM | POA: Diagnosis not present

## 2018-12-01 DIAGNOSIS — R05 Cough: Secondary | ICD-10-CM | POA: Diagnosis not present

## 2018-12-07 ENCOUNTER — Ambulatory Visit
Admission: RE | Admit: 2018-12-07 | Discharge: 2018-12-07 | Disposition: A | Discharge status: Home / Self Care | Payer: PPO | Source: Ambulatory Visit | Attending: Gastroenterology | Admitting: Gastroenterology

## 2018-12-07 ENCOUNTER — Other Ambulatory Visit: Payer: Self-pay

## 2018-12-07 ENCOUNTER — Ambulatory Visit: Payer: PPO

## 2018-12-07 DIAGNOSIS — R7989 Other specified abnormal findings of blood chemistry: Secondary | ICD-10-CM | POA: Diagnosis not present

## 2018-12-07 DIAGNOSIS — R1013 Epigastric pain: Secondary | ICD-10-CM | POA: Diagnosis not present

## 2018-12-07 DIAGNOSIS — R1011 Right upper quadrant pain: Secondary | ICD-10-CM | POA: Diagnosis not present

## 2018-12-07 DIAGNOSIS — R748 Abnormal levels of other serum enzymes: Secondary | ICD-10-CM | POA: Diagnosis not present

## 2018-12-07 MED ORDER — GADOBUTROL 1 MMOL/ML IV SOLN
5.0000 mL | Freq: Once | INTRAVENOUS | Status: AC | PRN
  Administered 2018-12-07: 5 mL via INTRAVENOUS

## 2018-12-15 ENCOUNTER — Ambulatory Visit: Admission: RE | Admit: 2018-12-15 | Payer: PPO | Source: Ambulatory Visit

## 2018-12-28 ENCOUNTER — Ambulatory Visit: Payer: PPO

## 2019-01-04 ENCOUNTER — Other Ambulatory Visit: Payer: Self-pay

## 2019-01-04 ENCOUNTER — Ambulatory Visit
Admission: RE | Admit: 2019-01-04 | Discharge: 2019-01-04 | Disposition: A | Discharge status: Home / Self Care | Payer: PPO | Source: Ambulatory Visit | Attending: Otolaryngology | Admitting: Otolaryngology

## 2019-01-04 DIAGNOSIS — R42 Dizziness and giddiness: Secondary | ICD-10-CM | POA: Insufficient documentation

## 2019-01-04 DIAGNOSIS — R748 Abnormal levels of other serum enzymes: Secondary | ICD-10-CM | POA: Diagnosis not present

## 2019-01-04 MED ORDER — GADOBUTROL 1 MMOL/ML IV SOLN
6.0000 mL | Freq: Once | INTRAVENOUS | Status: AC | PRN
  Administered 2019-01-04: 6 mL via INTRAVENOUS

## 2019-01-18 ENCOUNTER — Telehealth: Payer: Self-pay | Admitting: Student in an Organized Health Care Education/Training Program

## 2019-01-18 ENCOUNTER — Other Ambulatory Visit: Payer: Self-pay | Admitting: Student in an Organized Health Care Education/Training Program

## 2019-01-18 MED ORDER — CYCLOBENZAPRINE HCL 10 MG PO TABS: 10.0000 mg | ORAL_TABLET | Freq: Three times a day (TID) | ORAL | 2 refills | Status: DC | PRN

## 2019-01-18 NOTE — Telephone Encounter (Signed)
Will you send this in?

## 2019-01-18 NOTE — Telephone Encounter (Signed)
Patient notified script has been sent in.

## 2019-01-18 NOTE — Telephone Encounter (Signed)
Patient left vmail on 11-25 at 3:29 stating she needs flexiril generic sent in. Pharmacy also called stating the patient had contacted them for a refill.

## 2019-01-25 ENCOUNTER — Telehealth: Payer: Self-pay | Admitting: *Deleted

## 2019-01-25 NOTE — Telephone Encounter (Signed)
Patient advised to see PCP because this is considered an acute condition.

## 2019-01-26 DIAGNOSIS — M5489 Other dorsalgia: Secondary | ICD-10-CM | POA: Diagnosis not present

## 2019-01-26 DIAGNOSIS — J9811 Atelectasis: Secondary | ICD-10-CM | POA: Diagnosis not present

## 2019-01-26 DIAGNOSIS — J40 Bronchitis, not specified as acute or chronic: Secondary | ICD-10-CM | POA: Diagnosis not present

## 2019-01-26 DIAGNOSIS — R0789 Other chest pain: Secondary | ICD-10-CM | POA: Diagnosis not present

## 2019-02-23 ENCOUNTER — Ambulatory Visit
Payer: PPO | Attending: Student in an Organized Health Care Education/Training Program | Admitting: Student in an Organized Health Care Education/Training Program

## 2019-02-23 ENCOUNTER — Encounter: Payer: Self-pay | Admitting: Student in an Organized Health Care Education/Training Program

## 2019-02-23 ENCOUNTER — Other Ambulatory Visit: Payer: Self-pay

## 2019-02-23 DIAGNOSIS — M79604 Pain in right leg: Secondary | ICD-10-CM | POA: Diagnosis not present

## 2019-02-23 DIAGNOSIS — M79605 Pain in left leg: Secondary | ICD-10-CM | POA: Diagnosis not present

## 2019-02-23 DIAGNOSIS — M5416 Radiculopathy, lumbar region: Secondary | ICD-10-CM | POA: Diagnosis not present

## 2019-02-23 DIAGNOSIS — H539 Unspecified visual disturbance: Secondary | ICD-10-CM

## 2019-02-23 DIAGNOSIS — G894 Chronic pain syndrome: Secondary | ICD-10-CM | POA: Diagnosis not present

## 2019-02-23 DIAGNOSIS — G8929 Other chronic pain: Secondary | ICD-10-CM

## 2019-02-23 DIAGNOSIS — H5316 Psychophysical visual disturbances: Secondary | ICD-10-CM | POA: Diagnosis not present

## 2019-02-23 DIAGNOSIS — M47816 Spondylosis without myelopathy or radiculopathy, lumbar region: Secondary | ICD-10-CM

## 2019-02-23 MED ORDER — TRAMADOL HCL 50 MG PO TABS: 100.0000 mg | ORAL_TABLET | Freq: Two times a day (BID) | ORAL | 3 refills | Status: DC | PRN

## 2019-02-23 NOTE — Progress Notes (Signed)
Virtual Encounter - Pain Management PROVIDER NOTE: Information contained herein reflects review and annotations entered in association with encounter. Interpretation of such information and data should be left to medically-trained personnel. Information provided to patient can be located elsewhere in the medical record under "Patient Instructions". Document created using STT-dictation technology, any transcriptional errors that may result from process are unintentional.    Contact & Pharmacy Preferred: [phone] Home: [phone] (home) Mobile: There is no such number on file (mobile). E-mail: No e-mail address on record  [place], [place] - [address] [place] [address] Phone: [phone] Fax: [phone]   Pre-screening  Sandra [name] offered "in-person" vs "virtual" encounter. She indicated preferring virtual for this encounter.   Reason COVID-19*  Social distancing based on CDC and AMA recommendations.   I contacted Norhan Tanzillo on 02/23/2019 via telephone.      I clearly identified myself as Gillis Santa, MD. I verified that I was speaking with the correct person using two identifiers (Name: Sandra [name], and date of birth: Nov 13, 1955).  Consent I sought verbal advanced consent from Sandra [name] for virtual visit interactions. I informed Sandra [name] of possible security and privacy concerns, risks, and limitations associated with providing "not-in-person" medical evaluation and management services. I also informed Sandra [name] of the availability of "in-person" appointments. Finally, I informed her that there would be a charge for the virtual visit and that she could be  personally, fully or partially, financially responsible for it. Sandra [name] expressed understanding and agreed to proceed.   This visit was completed via telephone due to the restrictions of the COVID-19 pandemic. All issues as above were discussed and addressed but no  physical exam was performed. If it was felt that the patient should be evaluated in the office, they were directed there. The patient verbally consented to this visit. Patient was unable to complete an audio/visual visit due to Technical difficulties and/or Lack of internet. Due to the catastrophic nature of the COVID-19 pandemic, this visit was done through audio contact only.  Location of the patient: home address (see Epic for details)  Location of the provider: office   Historic Elements   Sandra [name] is a 64 y.o. year old, female patient evaluated today after her last encounter by our practice on 01/25/2019. Sandra [name]  has a past medical history of Anemia, Anxiety (08/01/2014), Arthritis, degenerative (07/30/2013), Clinical depression (06/30/2014), Depression, Elevated liver enzymes, GERD (gastroesophageal reflux disease), H/O breast biopsy (01/05/2015), H/O: attempted suicide (2013), Hepatitis B, Hiatal hernia, History of hysterectomy (01/05/2015), History of peptic ulcer disease (01/05/2015), Hypercholesteremia, Hypertension, Legally blind, Panic attacks, PUD (peptic ulcer disease), Rectocele (01/05/2015), Retinitis pigmentosa, and S/P cholecystectomy (01/05/2015). She also  has a past surgical history that includes Cholecystectomy; Rectocele repair; Abdominal hysterectomy; Appendectomy; Colonoscopy; Esophagogastroduodenoscopy; Sphincterotomy (N/A, 01/24/2017); Hemorrhoid surgery (N/A, 03/07/2017); Hernia repair; Esophagogastroduodenoscopy (egd) with propofol (N/A, 09/17/2017); Oophorectomy; and Breast biopsy (Bilateral, 1977). Sandra [name] has a current medication list which includes the following prescription(s): acetaminophen, albuterol, amlodipine-benazepril, buspirone, cyclobenzaprine, escitalopram, eszopiclone, fluticasone, gabapentin, hydrochlorothiazide, lansoprazole, levocetirizine, meclizine, melatonin, montelukast, ondansetron, simvastatin, temazepam, tramadol, vitamin b-12, alprazolam,  aspirin, benzonatate, buspirone, famotidine, and lidocaine. She  reports that she quit smoking about 45 years ago. She has never used smokeless tobacco. She reports that she does not drink alcohol or use drugs. Sandra [name] is allergic to trazodone; acetaminophen-codeine; codeine; oxycontin [oxycodone hcl]; trazodone and nefazodone; vicodin [hydrocodone-acetaminophen]; zolpidem; hydrocodone-acetaminophen; and oxycodone-acetaminophen.   HPI  Today, she is being contacted for medication management.   Had 2 falls 1 month ago.  Patient feels that the falls were related to her vertigo.  Patient is requesting to increase her tramadol quantity.  I politely declined and informed her that this could potentially increase her risk of falls.  Furthermore she has been using Hydromet cough suspension for a persistent cough.  She informs me that she is no longer utilizing this medication.  Patient is on many medications that could be sedative however the patient states that she does not experience any daytime sedation.  I recommend that we continue her tramadol as prescribed 100 mg twice daily as needed to help manage her pain but would not recommend dose escalation at this point.  I did offer the patient an inpatient visit for intramuscular Norflex and Toradol to assist with her acute muscular low back pain as a result of her fall.  Patient will let us know if she would like to proceed.  Pharmacotherapy Assessment  Analgesic: 01/20/2019  3   11/23/2018  Tramadol Hcl 50 MG Tablet  120.00  30 Bi Lat   [id number]   [place] (1669)   2  20.00 MME  Medicare   [place]    Monitoring: Pharmacotherapy: No side-effects or adverse reactions reported. [place] PMP: PDMP reviewed during this encounter.       Compliance: No problems identified. Effectiveness: Clinically acceptable. Plan: Refer to "POC".  UDS:  Summary  Date Value Ref Range Status  11/24/2018 Note  Final    Comment:     ==================================================================== ToxASSURE Select 13 (MW) ==================================================================== Specimen Alert Note: Urinary creatinine is low; ability to detect some drugs may be compromised. Interpret results with caution. (Creatinine) ==================================================================== Test                             Result       Flag       Units Drug Present and Declared for Prescription Verification   Oxazepam                       627          EXPECTED   ng/mg creat   Temazepam                      5820         EXPECTED   ng/mg creat    Oxazepam and temazepam are expected metabolites of diazepam.    Oxazepam is also an expected metabolite of other benzodiazepine    drugs, including chlordiazepoxide, prazepam, clorazepate, halazepam,    and temazepam.  Oxazepam and temazepam are available as scheduled    prescription medications.   Tramadol                       >33333       EXPECTED   ng/mg creat   O-Desmethyltramadol            >33333       EXPECTED   ng/mg creat   N-Desmethyltramadol            16893        EXPECTED   ng/mg creat    Source of tramadol is a prescription medication. O-desmethyltramadol    and N-desmethyltramadol are expected metabolites of tramadol. Drug Absent but Declared for Prescription Verification   Alprazolam  Not Detected UNEXPECTED ng/mg creat ==================================================================== Test                      Result    Flag   Units      Ref Range   Creatinine              15        LL     mg/dL      >=20 ==================================================================== Declared Medications:  The flagging and interpretation on this report are based on the  following declared medications.  Unexpected results may arise from  inaccuracies in the declared medications.  **Note: The testing scope of this panel includes these  medications:  Alprazolam  Temazepam  Tramadol  **Note: The testing scope of this panel does not include the  following reported medications:  Acetaminophen  Albuterol  Amlodipine  Aspirin  Benazepril  Benzonatate  Buspirone  Cyanocobalamin  Cyclobenzaprine  Escitalopram (Lexapro)  Eszopiclone  Fluticasone  Gabapentin  Hydrochlorothiazide  Lansoprazole  Levocetirizine  Lidocaine  Meclizine  Melatonin  Montelukast  Ondansetron  Simvastatin ==================================================================== For clinical consultation, please call [phone]. ====================================================================    Laboratory Chemistry Profile (12 mo)  Renal: No results found for requested labs within last 8760 hours.  Lab Results  Component Value Date   GFRAA >60 01/16/2017   GFRNONAA >60 01/16/2017   Hepatic: No results found for requested labs within last 8760 hours. Lab Results  Component Value Date   AST 24 05/13/2015   ALT 13 (L) 05/13/2015   Other: No results found for requested labs within last 8760 hours. Note: Above Lab results reviewed.  Imaging  MR BRAIN/IAC W WO CONTRAST CLINICAL DATA:  Dizziness. Additional history provided: Left ear ringing, sounds are "muffled," dizziness, fell in January 2020, symptoms since then.  EXAM: MRI HEAD WITHOUT AND WITH CONTRAST  TECHNIQUE: Multiplanar, multiecho pulse sequences of the brain and surrounding structures were obtained without and with intravenous contrast.  CONTRAST:  86mL GADAVIST GADOBUTROL 1 MMOL/ML IV SOLN  COMPARISON:  6 mL Gadavist intravenous contrast.  FINDINGS: Brain:  Multiple sequences are motion degraded, limiting evaluation. Most notably, this includes moderate/severe motion degradation of the coronal acquired postcontrast imaging through the internal auditory canals and postcontrast whole brain imaging.  There is no evidence of acute infarct.  No evidence  of intracranial mass.  No midline shift or extra-axial fluid collection.  No chronic intracranial blood products.  No cerebellopontine angle mass or internal auditory canal lesion is demonstrated. Unremarkable appearance of the seventh and eighth cranial nerves bilaterally.  Mild scattered T2/FLAIR hyperintensity within the cerebral white matter is nonspecific, but consistent with chronic small vessel ischemic disease.  Cerebral volume is normal for age.  No abnormal intracranial enhancement demonstrated on moderate/severely motion degraded whole brain postcontrast imaging.  Vascular: Flow voids maintained within the proximal large arterial vessels.  Skull and upper cervical spine: No focal marrow lesion  Sinuses/Orbits: Mild scattered paranasal sinus mucosal thickening. No significant mastoid effusion. Visualized orbits demonstrate no acute abnormality.  IMPRESSION: 1. Motion degraded and limited examination. Most notably, there is moderate/severe motion degradation of the coronal acquired postcontrast imaging through the internal auditory canals and of the whole brain postcontrast imaging. 2. No cerebellopontine angle mass or internal auditory canal lesion demonstrated. 3. No evidence of acute intracranial abnormality. 4. Mild chronic small vessel ischemic disease.  Electronically Signed   By: Kellie Simmering DO   On: 01/04/2019 15:39   Assessment  The  primary encounter diagnosis was Chronic pain [name]. Diagnoses of Chronic lumbar radicular pain (Bilateral), Lumbar facet arthropathy, Chronic lower extremity pain (Bilateral), Lumbar facet [name] (Bilateral) (R>L), Sandra [name] (CBS), Impaired visual perception, and Encounter for chronic pain management were also pertinent to this visit.  Plan of Care   I have changed Jolayne Harju's traMADol. I am also having her maintain her simvastatin, aspirin, ondansetron, eszopiclone, albuterol, ALPRAZolam,  gabapentin, lansoprazole, fluticasone, escitalopram, benzonatate, vitamin B-12, amlodipine-benazepril, Melatonin, acetaminophen, lidocaine, busPIRone, hydrochlorothiazide, montelukast, meclizine, levocetirizine, temazepam, cyclobenzaprine, busPIRone, and famotidine.  Pharmacotherapy (Medications Ordered): Meds ordered this encounter  Medications  . traMADol (ULTRAM) 50 MG tablet    Sig: Take 2 tablets (100 mg total) by mouth every 12 (twelve) hours as needed for severe pain. For chronic pain [name]    Dispense:  120 tablet    Refill:  3   Follow-up plan:   Return in about 4 months (around 06/23/2019) for Medication Management.    Recent Visits No visits were found meeting these conditions.  Showing recent visits within past 90 days and meeting all other requirements   Today's Visits Date Type Provider Dept  02/23/19 Office Visit Gillis Santa, MD [facility]  Showing today's visits and meeting all other requirements   Future Appointments No visits were found meeting these conditions.  Showing future appointments within next 90 days and meeting all other requirements   I discussed the assessment and treatment plan with the patient. The patient was provided an opportunity to ask questions and all were answered. The patient agreed with the plan and demonstrated an understanding of the instructions.  Patient advised to call back or seek an in-person evaluation if the symptoms or condition worsens.  Total duration of non-face-to-face encounter: 30 minutes.  Note by: Gillis Santa, MD Date: 02/23/2019; Time: 1:36 PM

## 2019-03-25 ENCOUNTER — Telehealth: Payer: Self-pay | Admitting: *Deleted

## 2019-03-29 ENCOUNTER — Encounter: Payer: Self-pay | Admitting: Student in an Organized Health Care Education/Training Program

## 2019-03-29 ENCOUNTER — Ambulatory Visit
Payer: PPO | Attending: Student in an Organized Health Care Education/Training Program | Admitting: Student in an Organized Health Care Education/Training Program

## 2019-03-29 ENCOUNTER — Telehealth: Payer: Self-pay | Admitting: Student in an Organized Health Care Education/Training Program

## 2019-03-29 ENCOUNTER — Other Ambulatory Visit: Payer: Self-pay

## 2019-03-29 VITALS — Ht 59.0 in | Wt 138.0 lb

## 2019-03-29 DIAGNOSIS — M19021 Primary osteoarthritis, right elbow: Secondary | ICD-10-CM | POA: Diagnosis not present

## 2019-03-29 DIAGNOSIS — M170 Bilateral primary osteoarthritis of knee: Secondary | ICD-10-CM

## 2019-03-29 NOTE — Progress Notes (Signed)
Patient: Sandra [name]  Service Category: E/M  Provider: Gillis Santa, MD  DOB: Apr 30, 1955  DOS: 03/29/2019  Location: Office  MRN: [id number]  Setting: Ambulatory outpatient  Referring Provider: Baxter Hire, MD  Type: Established Patient  Specialty: Interventional Pain Management  PCP: Baxter Hire, MD  Location: Home  Delivery: TeleHealth     Virtual Encounter - Pain Management PROVIDER NOTE: Information contained herein reflects review and annotations entered in association with encounter. Interpretation of such information and data should be left to medically-trained personnel. Information provided to patient can be located elsewhere in the medical record under "Patient Instructions". Document created using STT-dictation technology, any transcriptional errors that may result from process are unintentional.    Contact & Pharmacy Preferred: [phone] Home: [phone] (home) Mobile: There is no such number on file (mobile). E-mail: No e-mail address on record  [place], [place] - [address] [place] [address] Phone: [phone] Fax: [phone]   Pre-screening  Sandra [name] offered "in-person" vs "virtual" encounter. She indicated preferring virtual for this encounter.   Reason COVID-19*  Social distancing based on CDC and AMA recommendations.   I contacted Sandra [name] on 03/29/2019 via telephone (legally blind).      I clearly identified myself as Gillis Santa, MD. I verified that I was speaking with the correct person using two identifiers (Name: Sandra [name], and date of birth: 1955-08-27).  This visit was completed via telephone due to the restrictions of the COVID-19 pandemic. All issues as above were discussed and addressed but no physical exam was performed. If it was felt that the patient should be evaluated in the office, they were directed there. The patient verbally consented to this visit. Patient was unable to complete  an audio/visual visit due to Technical difficulties and/or Lack of internet. Due to the catastrophic nature of the COVID-19 pandemic, this visit was done through audio contact only.  Location of the patient: home address (see Epic for details)  Location of the provider: office Consent I sought verbal advanced consent from Sandra [name] for virtual visit interactions. I informed Sandra [name] of possible security and privacy concerns, risks, and limitations associated with providing "not-in-person" medical evaluation and management services. I also informed Sandra [name] of the availability of "in-person" appointments. Finally, I informed her that there would be a charge for the virtual visit and that she could be  personally, fully or partially, financially responsible for it. Sandra [name] expressed understanding and agreed to proceed.   Historic Elements   Sandra [name] is a 64 y.o. year old, female patient evaluated today after her last contact with our practice on 03/29/2019. Sandra [name]  has a past medical history of Anemia, Anxiety (08/01/2014), Arthritis, degenerative (07/30/2013), Clinical depression (06/30/2014), Depression, Elevated liver enzymes, GERD (gastroesophageal reflux disease), H/O breast biopsy (01/05/2015), H/O: attempted suicide (2013), Hepatitis B, Hiatal hernia, History of hysterectomy (01/05/2015), History of peptic ulcer disease (01/05/2015), Hypercholesteremia, Hypertension, Legally blind, Panic attacks, PUD (peptic ulcer disease), Rectocele (01/05/2015), Retinitis pigmentosa, and S/P cholecystectomy (01/05/2015). She also  has a past surgical history that includes Cholecystectomy; Rectocele repair; Abdominal hysterectomy; Appendectomy; Colonoscopy; Esophagogastroduodenoscopy; Sphincterotomy (N/A, 01/24/2017); Hemorrhoid surgery (N/A, 03/07/2017); Hernia repair; Esophagogastroduodenoscopy (egd) with propofol (N/A, 09/17/2017); Oophorectomy; and Breast biopsy (Bilateral, 1977). Ms.  [name] has a current medication list which includes the following prescription(s): acetaminophen, albuterol, amlodipine-benazepril, cyclobenzaprine, famotidine, fluticasone, gabapentin, hydrochlorothiazide, lansoprazole, meclizine, melatonin, ondansetron, simvastatin, temazepam, tramadol, vitamin b-12, alprazolam, aspirin,  benzonatate, buspirone, buspirone, escitalopram, eszopiclone, levocetirizine, lidocaine, and montelukast. She  reports that she quit smoking about 45 years ago. She has never used smokeless tobacco. She reports that she does not drink alcohol or use drugs. Sandra [name] is allergic to trazodone; acetaminophen-codeine; codeine; oxycontin [oxycodone hcl]; trazodone and nefazodone; vicodin [hydrocodone-acetaminophen]; zolpidem; hydrocodone-acetaminophen; and oxycodone-acetaminophen.   HPI  Today, she is being contacted for new problems.   Patient is complaining of right elbow pain.  It is worse with arm extension.  Her pain radiates down to her forearm as well.  She denies any pain radiating from her neck to her right shoulder to her right elbow.  She feels that it is distal to her right elbow.  Is having difficulty sleeping due to her right elbow pain.  Patient is also endorsing bilateral knee pain.  History of bilateral knee osteoarthritis.  Previous knee injections over 3 years ago.  Patient having a hard time recalling but believes they were intra-articular knee steroid injections.  Did provide moderate pain relief at that time.   Laboratory Chemistry Profile   Renal Lab Results  Component Value Date   BUN 18 01/16/2017   CREATININE 0.75 01/16/2017   GFRAA >60 01/16/2017   GFRNONAA >60 01/16/2017    Hepatic Lab Results  Component Value Date   AST 24 05/13/2015   ALT 13 (L) 05/13/2015   ALBUMIN 4.5 05/13/2015   ALKPHOS 75 05/13/2015    Electrolytes Lab Results  Component Value Date   NA 138 01/16/2017   K 4.2 01/16/2017   CL 101 01/16/2017   CALCIUM 9.4 01/16/2017    MG 2.0 02/07/2015    Bone Lab Results  Component Value Date   25OHVITD1 19 (L) 02/07/2015   25OHVITD2 3.9 02/07/2015   25OHVITD3 15 02/07/2015    Coagulation Lab Results  Component Value Date   PLT 300 01/16/2017    Cardiovascular Lab Results  Component Value Date   CKTOTAL 55 09/08/2013   CKMB < 0.5 (L) 09/08/2013   TROPONINI < 0.02 09/08/2013   HGB 13.1 01/16/2017   HCT 39.6 01/16/2017    Inflammation (CRP: Acute Phase) (ESR: Chronic Phase) Lab Results  Component Value Date   CRP <0.5 02/07/2015   ESRSEDRATE 26 02/07/2015      Note: Above Lab results reviewed.  Imaging  MR BRAIN/IAC W WO CONTRAST CLINICAL DATA:  Dizziness. Additional history provided: Left ear ringing, sounds are "muffled," dizziness, fell in January 2020, symptoms since then.  EXAM: MRI HEAD WITHOUT AND WITH CONTRAST  TECHNIQUE: Multiplanar, multiecho pulse sequences of the brain and surrounding structures were obtained without and with intravenous contrast.  CONTRAST:  35m GADAVIST GADOBUTROL 1 MMOL/ML IV SOLN  COMPARISON:  6 mL Gadavist intravenous contrast.  FINDINGS: Brain:  Multiple sequences are motion degraded, limiting evaluation. Most notably, this includes moderate/severe motion degradation of the coronal acquired postcontrast imaging through the internal auditory canals and postcontrast whole brain imaging.  There is no evidence of acute infarct.  No evidence of intracranial mass.  No midline shift or extra-axial fluid collection.  No chronic intracranial blood products.  No cerebellopontine angle mass or internal auditory canal lesion is demonstrated. Unremarkable appearance of the seventh and eighth cranial nerves bilaterally.  Mild scattered T2/FLAIR hyperintensity within the cerebral white matter is nonspecific, but consistent with chronic small vessel ischemic disease.  Cerebral volume is normal for age.  No abnormal intracranial enhancement demonstrated  on moderate/severely motion degraded whole brain postcontrast imaging.  Vascular: Flow voids  maintained within the proximal large arterial vessels.  Skull and upper cervical spine: No focal marrow lesion  Sinuses/Orbits: Mild scattered paranasal sinus mucosal thickening. No significant mastoid effusion. Visualized orbits demonstrate no acute abnormality.  IMPRESSION: 1. Motion degraded and limited examination. Most notably, there is moderate/severe motion degradation of the coronal acquired postcontrast imaging through the internal auditory canals and of the whole brain postcontrast imaging. 2. No cerebellopontine angle mass or internal auditory canal lesion demonstrated. 3. No evidence of acute intracranial abnormality. 4. Mild chronic small vessel ischemic disease.  Electronically Signed   By: Kellie Simmering DO   On: 01/04/2019 15:39  Assessment  The primary encounter diagnosis was Bilateral primary osteoarthritis of knee. A diagnosis of Arthropathy of right elbow was also pertinent to this visit.  Plan of Care   I am having Bernard Golz maintain her simvastatin, aspirin, ondansetron, eszopiclone, albuterol, ALPRAZolam, gabapentin, lansoprazole, fluticasone, escitalopram, benzonatate, vitamin B-12, amlodipine-benazepril, Melatonin, acetaminophen, lidocaine, busPIRone, hydrochlorothiazide, montelukast, meclizine, levocetirizine, temazepam, cyclobenzaprine, busPIRone, famotidine, and traMADol.  1. Bilateral primary osteoarthritis of knee -Previous knee injection was greater than 3 years ago.  Discussed bilateral knee intra-articular steroid injection with methylprednisolone.  Risks and benefits reviewed and patient like to proceed. - KNEE INJECTION; Future  2. Arthropathy of right elbow -Isolated right elbow pain likely related to arthropathy.  Less likely related to cervical radicular pain as patient denies radiating pain from her neck.  Also review of cervical MRI done in  2015 was unremarkable except for mild central canal stenosis at C4-C5 which is unlikely to be causing her isolated elbow pain. - PR DRAIN/INJECT INTERMEDIATE JOINT/BURSA   Orders:  Orders Placed This Encounter  Procedures  . KNEE INJECTION    Local Anesthetic & Steroid injection.    Standing Status:   Future    Standing Expiration Date:   04/28/2019    Scheduling Instructions:     Side: Bilateral     Sedation: None     Timeframe: As soon as schedule allows    Order Specific Question:   Where will this procedure be performed?    Answer:   [facility]  . PR DRAIN/INJECT INTERMEDIATE JOINT/BURSA    Right elbow steroid injection under fluoroscopy without sedation   Follow-up plan:   Return in about 1 week (around 04/05/2019) for Bilateral knee steroid, right elbow steroid (done on same day), without sedation.    Recent Visits Date Type Provider Dept  02/23/19 Office Visit Gillis Santa, MD [facility]  Showing recent visits within past 90 days and meeting all other requirements   Today's Visits Date Type Provider Dept  03/29/19 Office Visit Gillis Santa, MD [facility]  Showing today's visits and meeting all other requirements   Future Appointments Date Type Provider Dept  06/22/19 Appointment Gillis Santa, MD [facility]  Showing future appointments within next 90 days and meeting all other requirements   I discussed the assessment and treatment plan with the patient. The patient was provided an opportunity to ask questions and all were answered. The patient agreed with the plan and demonstrated an understanding of the instructions.  Patient advised to call back or seek an in-person evaluation if the symptoms or condition worsens.  Duration of encounter: 25 minutes.  Note by: Gillis Santa, MD Date: 03/29/2019; Time: 1:39 PM

## 2019-03-29 NOTE — Telephone Encounter (Signed)
Patient has been added to VV schedule for Dr. Holley Raring for today. She will need call for chart update

## 2019-04-01 ENCOUNTER — Ambulatory Visit: Payer: PPO | Admitting: Student in an Organized Health Care Education/Training Program

## 2019-04-05 ENCOUNTER — Other Ambulatory Visit: Payer: Self-pay | Admitting: Internal Medicine

## 2019-04-05 ENCOUNTER — Ambulatory Visit
Payer: PPO | Attending: Student in an Organized Health Care Education/Training Program | Admitting: Student in an Organized Health Care Education/Training Program

## 2019-04-05 ENCOUNTER — Other Ambulatory Visit: Payer: Self-pay

## 2019-04-05 ENCOUNTER — Encounter: Payer: Self-pay | Admitting: Student in an Organized Health Care Education/Training Program

## 2019-04-05 VITALS — BP 152/81 | HR 95 | Temp 99.4°F | Resp 16 | Ht 61.0 in | Wt 138.0 lb

## 2019-04-05 DIAGNOSIS — Z1231 Encounter for screening mammogram for malignant neoplasm of breast: Secondary | ICD-10-CM

## 2019-04-05 DIAGNOSIS — M19021 Primary osteoarthritis, right elbow: Secondary | ICD-10-CM | POA: Diagnosis not present

## 2019-04-05 DIAGNOSIS — M170 Bilateral primary osteoarthritis of knee: Secondary | ICD-10-CM | POA: Insufficient documentation

## 2019-04-05 MED ORDER — LIDOCAINE HCL 2 % IJ SOLN
INTRAMUSCULAR | Status: AC
  Filled 2019-04-05: qty 20

## 2019-04-05 MED ORDER — METHYLPREDNISOLONE ACETATE 40 MG/ML IJ SUSP
40.0000 mg | Freq: Once | INTRAMUSCULAR | Status: AC
  Administered 2019-04-05: 11:00:00 40 mg

## 2019-04-05 MED ORDER — METHYLPREDNISOLONE ACETATE 40 MG/ML IJ SUSP
INTRAMUSCULAR | Status: AC
  Filled 2019-04-05: qty 2

## 2019-04-05 MED ORDER — LIDOCAINE HCL 2 % IJ SOLN
20.0000 mL | Freq: Once | INTRAMUSCULAR | Status: AC
  Administered 2019-04-05: 400 mg

## 2019-04-05 MED ORDER — METHYLPREDNISOLONE ACETATE 40 MG/ML IJ SUSP
40.0000 mg | Freq: Once | INTRAMUSCULAR | Status: AC
  Administered 2019-04-05: 40 mg via INTRA_ARTICULAR

## 2019-04-05 MED ORDER — ROPIVACAINE HCL 2 MG/ML IJ SOLN
4.0000 mL | Freq: Once | INTRAMUSCULAR | Status: AC
  Administered 2019-04-05: 11:00:00 4 mL via INTRA_ARTICULAR

## 2019-04-05 MED ORDER — ROPIVACAINE HCL 2 MG/ML IJ SOLN
INTRAMUSCULAR | Status: AC
  Filled 2019-04-05: qty 10

## 2019-04-05 NOTE — Patient Instructions (Signed)

## 2019-04-05 NOTE — Progress Notes (Signed)
PROVIDER NOTE: Information contained herein reflects review and annotations entered in association with encounter. Interpretation of such information and data should be left to medically-trained personnel. Information provided to patient can be located elsewhere in the medical record under "Patient Instructions". Document created using STT-dictation technology, any transcriptional errors that may result from process are unintentional.    Patient: Sandra [name]  Service Category: Procedure  Provider: Gillis Santa, MD  DOB: 1956-02-04  DOS: 04/05/2019  Location: [facility]  MRN: [id number]  Setting: Ambulatory - outpatient  Referring Provider: Baxter Hire, MD  Type: Established Patient  Specialty: Interventional Pain Management  PCP: Baxter Hire, MD   Primary Reason for Visit: Interventional Pain Management Treatment. CC: Knee Pain (bilateral) and Elbow Pain (right )  Procedure:          Anesthesia, Analgesia, Anxiolysis:  Type: Diagnostic Intra-Articular Local anesthetic and steroid Knee Injection #1  Region: Medial infrapatellar Knee Region Level: Knee Joint Laterality: Bilateral  Type: Local Anesthesia Indication(s): Analgesia         Local Anesthetic: Lidocaine 1-2% Route: Infiltration ([place]/IM) IV Access: Declined Sedation: Declined   Position: Sitting   Indications: 1. Bilateral primary osteoarthritis of knee   2. Arthropathy of right elbow    Pain Score: Pre-procedure: 6 /10 Post-procedure: 6 /10   Pre-op Assessment:  Sandra [name] is a 64 y.o. (year old), female patient, seen today for interventional treatment. She  has a past surgical history that includes Cholecystectomy; Rectocele repair; Abdominal hysterectomy; Appendectomy; Colonoscopy; Esophagogastroduodenoscopy; Sphincterotomy (N/A, 01/24/2017); Hemorrhoid surgery (N/A, 03/07/2017); Hernia repair; Esophagogastroduodenoscopy (egd) with propofol (N/A, 09/17/2017); Oophorectomy; and Breast biopsy  (Bilateral, 1977). Sandra [name] has a current medication list which includes the following prescription(s): acetaminophen, albuterol, amlodipine-benazepril, cyclobenzaprine, famotidine, fluticasone, gabapentin, hydrochlorothiazide, lansoprazole, levocetirizine, meclizine, melatonin, montelukast, ondansetron, simvastatin, temazepam, tramadol, vitamin b-12, alprazolam, aspirin, benzonatate, buspirone, buspirone, escitalopram, eszopiclone, and lidocaine. Her primarily concern today is the Knee Pain (bilateral) and Elbow Pain (right )  Initial Vital Signs:  Pulse/HCG Rate: 95  Temp: 99.4 F (37.4 C) Resp: 16 BP: (!) 152/81 SpO2: 100 %  BMI: Estimated body mass index is 26.07 kg/m as calculated from the following:   Height as of this encounter: 5\' 1"  (1.549 m).   Weight as of this encounter: 138 lb (62.6 kg).  Risk Assessment: Allergies: Reviewed. She is allergic to trazodone; acetaminophen-codeine; codeine; oxycontin [oxycodone hcl]; trazodone and nefazodone; vicodin [hydrocodone-acetaminophen]; zolpidem; hydrocodone-acetaminophen; and oxycodone-acetaminophen.  Allergy Precautions: None required Coagulopathies: Reviewed. None identified.  Blood-thinner therapy: None at this time Active Infection(s): Reviewed. None identified. Sandra [name] is afebrile  Site Confirmation: Sandra [name] was asked to confirm the procedure and laterality before marking the site Procedure checklist: Completed Consent: Before the procedure and under the influence of no sedative(s), amnesic(s), or anxiolytics, the patient was informed of the treatment options, risks and possible complications. To fulfill our ethical and legal obligations, as recommended by the American Medical Association's Code of Ethics, I have informed the patient of my clinical impression; the nature and purpose of the treatment or procedure; the risks, benefits, and possible complications of the intervention; the alternatives, including doing  nothing; the risk(s) and benefit(s) of the alternative treatment(s) or procedure(s); and the risk(s) and benefit(s) of doing nothing. The patient was provided information about the general risks and possible complications associated with the procedure. These may include, but are not limited to: failure to achieve desired goals, infection, bleeding, organ or nerve damage, allergic reactions, paralysis, and death. In  addition, the patient was informed of those risks and complications associated to the procedure, such as failure to decrease pain; infection; bleeding; organ or nerve damage with subsequent damage to sensory, motor, and/or autonomic systems, resulting in permanent pain, numbness, and/or weakness of one or several areas of the body; allergic reactions; (i.e.: anaphylactic reaction); and/or death. Furthermore, the patient was informed of those risks and complications associated with the medications. These include, but are not limited to: allergic reactions (i.e.: anaphylactic or anaphylactoid reaction(s)); adrenal axis suppression; blood sugar elevation that in diabetics may result in ketoacidosis or comma; water retention that in patients with history of congestive heart failure may result in shortness of breath, pulmonary edema, and decompensation with resultant heart failure; weight gain; swelling or edema; medication-induced neural toxicity; particulate matter embolism and blood vessel occlusion with resultant organ, and/or nervous system infarction; and/or aseptic necrosis of one or more joints. Finally, the patient was informed that Medicine is not an exact science; therefore, there is also the possibility of unforeseen or unpredictable risks and/or possible complications that may result in a catastrophic outcome. The patient indicated having understood very clearly. We have given the patient no guarantees and we have made no promises. Enough time was given to the patient to ask questions, all of  which were answered to the patient's satisfaction. Sandra [name] has indicated that she wanted to continue with the procedure. Attestation: I, the ordering provider, attest that I have discussed with the patient the benefits, risks, side-effects, alternatives, likelihood of achieving goals, and potential problems during recovery for the procedure that I have provided informed consent. Date  Time: 04/05/2019 10:44 AM  Pre-Procedure Preparation:  Monitoring: As per clinic protocol. Respiration, ETCO2, SpO2, BP, heart rate and rhythm monitor placed and checked for adequate function Safety Precautions: Patient was assessed for positional comfort and pressure points before starting the procedure. Time-out: I initiated and conducted the "Time-out" before starting the procedure, as per protocol. The patient was asked to participate by confirming the accuracy of the "Time Out" information. Verification of the correct person, site, and procedure were performed and confirmed by me, the nursing staff, and the patient. "Time-out" conducted as per Joint Commission's Universal Protocol (UP.01.01.01). Time: 1100  Description of Procedure:          Target Area: Knee Joint Approach: Just above the Medial tibial plateau, lateral to the infrapatellar tendon. Area Prepped: Entire knee area, from the mid-thigh to the mid-shin. Prepping solution: DuraPrep (Iodine Povacrylex [0.7% available iodine] and Isopropyl Alcohol, 74% w/w) Safety Precautions: Aspiration looking for blood return was conducted prior to all injections. At no point did we inject any substances, as a needle was being advanced. No attempts were made at seeking any paresthesias. Safe injection practices and needle disposal techniques used. Medications properly checked for expiration dates. SDV (single dose vial) medications used. Description of the Procedure: Protocol guidelines were followed. The patient was placed in position over the fluoroscopy table.  The target area was identified and the area prepped in the usual manner. Skin & deeper tissues infiltrated with local anesthetic. Appropriate amount of time allowed to pass for local anesthetics to take effect. The procedure needles were then advanced to the target area. Proper needle placement secured. Negative aspiration confirmed. Solution injected in intermittent fashion, asking for systemic symptoms every 0.5cc of injectate. The needles were then removed and the area cleansed, making sure to leave some of the prepping solution back to take advantage of its long term bactericidal properties.  Vitals:   04/05/19 1043  BP: (!) 152/81  Pulse: 95  Resp: 16  Temp: 99.4 F (37.4 C)  TempSrc: Oral  SpO2: 100%  Weight: 138 lb (62.6 kg)  Height: 5\' 1"  (1.549 m)    Start Time: 1100 hrs. End Time: 1109 hrs. Materials:  Needle(s) Type: Regular needle Gauge: 25G Length: 1.5-in Medication(s): Please see orders for medications and dosing details. 8 cc solution made of 6.5 cc of 0.2% ropivacaine, 1.5 cc of methylprednisolone, 40 mg/cc.  4 cc injected in each knee, bilateral. Imaging Guidance:          Type of Imaging Technique: None used Indication(s): N/A Exposure Time: No patient exposure Contrast: None used. Fluoroscopic Guidance: N/A Ultrasound Guidance: N/A Interpretation: N/A  Antibiotic Prophylaxis:   Anti-infectives (From admission, onward)   None     Indication(s): None identified  Post-operative Assessment:  Post-procedure Vital Signs:  Pulse/HCG Rate: 95  Temp: 99.4 F (37.4 C) Resp: 16 BP: (!) 152/81 SpO2: 100 %  EBL: None  Complications: No immediate post-treatment complications observed by team, or reported by patient.  Note: The patient tolerated the entire procedure well. A repeat set of vitals were taken after the procedure and the patient was kept under observation following institutional policy, for this type of procedure. Post-procedural neurological  assessment was performed, showing return to baseline, prior to discharge. The patient was provided with post-procedure discharge instructions, including a section on how to identify potential problems. Should any problems arise concerning this procedure, the patient was given instructions to immediately contact us, at any time, without hesitation. In any case, we plan to contact the patient by telephone for a follow-up status report regarding this interventional procedure.  Comments:  No additional relevant information.  Plan of Care   Medications ordered for procedure: Meds ordered this encounter  Medications  . lidocaine (XYLOCAINE) 2 % (with pres) injection 400 mg  . ropivacaine (PF) 2 mg/mL (0.2%) (NAROPIN) injection 4 mL  . methylPREDNISolone acetate (DEPO-MEDROL) injection 40 mg  . methylPREDNISolone acetate (DEPO-MEDROL) injection 40 mg   Medications administered: We administered lidocaine, ropivacaine (PF) 2 mg/mL (0.2%), methylPREDNISolone acetate, and methylPREDNISolone acetate.  See the medical record for exact dosing, route, and time of administration.  Follow-up plan:   Return in about 4 weeks (around 05/03/2019) for Post Procedure Evaluation, virtual.      Status post bilateral intra-articular knee steroid, right elbow injection on 04/05/2019   Recent Visits Date Type Provider Dept  03/29/19 Office Visit Gillis Santa, MD [facility]  02/23/19 Office Visit Gillis Santa, MD [facility]  Showing recent visits within past 90 days and meeting all other requirements   Today's Visits Date Type Provider Dept  04/05/19 Procedure visit Gillis Santa, MD [facility]  Showing today's visits and meeting all other requirements   Future Appointments Date Type Provider Dept  05/04/19 Appointment Gillis Santa, MD [facility]  06/22/19 Appointment Gillis Santa, MD [facility]  Showing future appointments within next 90 days and  meeting all other requirements   Disposition: Discharge home  Discharge (Date  Time): 04/05/2019; 1110 hrs.   Primary Care Physician: Baxter Hire, MD Location: [facility] Note by: Gillis Santa, MD Date: 04/05/2019; Time: 11:26 AM  Disclaimer:  Medicine is not an exact science. The only guarantee in medicine is that nothing is guaranteed. It is important to note that the decision to proceed with this intervention was based on the information  collected from the patient. The Data and conclusions were drawn from the patient's questionnaire, the interview, and the physical examination. Because the information was provided in large part by the patient, it cannot be guaranteed that it has not been purposely or unconsciously manipulated. Every effort has been made to obtain as much relevant data as possible for this evaluation. It is important to note that the conclusions that lead to this procedure are derived in large part from the available data. Always take into account that the treatment will also be dependent on availability of resources and existing treatment guidelines, considered by other Pain Management Practitioners as being common knowledge and practice, at the time of the intervention. For Medico-Legal purposes, it is also important to point out that variation in procedural techniques and pharmacological choices are the acceptable norm. The indications, contraindications, technique, and results of the above procedure should only be interpreted and judged by a Board-Certified Interventional Pain Specialist with extensive familiarity and expertise in the same exact procedure and technique.

## 2019-04-05 NOTE — Progress Notes (Signed)
Safety precautions to be maintained throughout the outpatient stay will include: orient to surroundings, keep bed in low position, maintain call bell within reach at all times, provide assistance with transfer out of bed and ambulation.  

## 2019-04-05 NOTE — Progress Notes (Signed)
PROVIDER NOTE: Information contained herein reflects review and annotations entered in association with encounter. Interpretation of such information and data should be left to medically-trained personnel. Information provided to patient can be located elsewhere in the medical record under "Patient Instructions". Document created using STT-dictation technology, any transcriptional errors that may result from process are unintentional.    Patient: Sandra [name]  Service Category: Procedure  Provider: Gillis Santa, MD  DOB: 06-06-1955  DOS: 04/05/2019  Location: [facility]  MRN: [id number]  Setting: Ambulatory - outpatient  Referring Provider: Baxter Hire, MD  Type: Established Patient  Specialty: Interventional Pain Management  PCP: Baxter Hire, MD   Primary Reason for Visit: Interventional Pain Management Treatment. CC: Knee Pain (bilateral) and Elbow Pain (right )  Procedure:          Anesthesia, Analgesia, Anxiolysis:  Type: Diagnostic Elbow Joint Steroid Injection  #1  Region: Lateral Elbow Area Level: Elbow Laterality: Right-Sided  Type: Local Anesthesia I   Indications: 1. Bilateral primary osteoarthritis of knee   2. Arthropathy of right elbow    Pain Score: Pre-procedure: 6 /10 Post-procedure: 6 /10   Pre-op Assessment:  Sandra [name] is a 64 y.o. (year old), female patient, seen today for interventional treatment. She  has a past surgical history that includes Cholecystectomy; Rectocele repair; Abdominal hysterectomy; Appendectomy; Colonoscopy; Esophagogastroduodenoscopy; Sphincterotomy (N/A, 01/24/2017); Hemorrhoid surgery (N/A, 03/07/2017); Hernia repair; Esophagogastroduodenoscopy (egd) with propofol (N/A, 09/17/2017); Oophorectomy; and Breast biopsy (Bilateral, 1977). Sandra [name] has a current medication list which includes the following prescription(s): acetaminophen, albuterol, amlodipine-benazepril, cyclobenzaprine, famotidine, fluticasone,  gabapentin, hydrochlorothiazide, lansoprazole, levocetirizine, meclizine, melatonin, montelukast, ondansetron, simvastatin, temazepam, tramadol, vitamin b-12, alprazolam, aspirin, benzonatate, buspirone, buspirone, escitalopram, eszopiclone, and lidocaine. Her primarily concern today is the Knee Pain (bilateral) and Elbow Pain (right )  Initial Vital Signs:  Pulse/HCG Rate: 95  Temp: 99.4 F (37.4 C) Resp: 16 BP: (!) 152/81 SpO2: 100 %  BMI: Estimated body mass index is 26.07 kg/m as calculated from the following:   Height as of this encounter: 5\' 1"  (1.549 m).   Weight as of this encounter: 138 lb (62.6 kg).  Risk Assessment: Allergies: Reviewed. She is allergic to trazodone; acetaminophen-codeine; codeine; oxycontin [oxycodone hcl]; trazodone and nefazodone; vicodin [hydrocodone-acetaminophen]; zolpidem; hydrocodone-acetaminophen; and oxycodone-acetaminophen.  Allergy Precautions: None required Coagulopathies: Reviewed. None identified.  Blood-thinner therapy: None at this time Active Infection(s): Reviewed. None identified. Sandra [name] is afebrile  Site Confirmation: Sandra [name] was asked to confirm the procedure and laterality before marking the site Procedure checklist: Completed Consent: Before the procedure and under the influence of no sedative(s), amnesic(s), or anxiolytics, the patient was informed of the treatment options, risks and possible complications. To fulfill our ethical and legal obligations, as recommended by the American Medical Association's Code of Ethics, I have informed the patient of my clinical impression; the nature and purpose of the treatment or procedure; the risks, benefits, and possible complications of the intervention; the alternatives, including doing nothing; the risk(s) and benefit(s) of the alternative treatment(s) or procedure(s); and the risk(s) and benefit(s) of doing nothing. The patient was provided information about the general risks and  possible complications associated with the procedure. These may include, but are not limited to: failure to achieve desired goals, infection, bleeding, organ or nerve damage, allergic reactions, paralysis, and death. In addition, the patient was informed of those risks and complications associated to the procedure, such as failure to decrease pain; infection; bleeding; organ or nerve damage with subsequent damage  to sensory, motor, and/or autonomic systems, resulting in permanent pain, numbness, and/or weakness of one or several areas of the body; allergic reactions; (i.e.: anaphylactic reaction); and/or death. Furthermore, the patient was informed of those risks and complications associated with the medications. These include, but are not limited to: allergic reactions (i.e.: anaphylactic or anaphylactoid reaction(s)); adrenal axis suppression; blood sugar elevation that in diabetics may result in ketoacidosis or comma; water retention that in patients with history of congestive heart failure may result in shortness of breath, pulmonary edema, and decompensation with resultant heart failure; weight gain; swelling or edema; medication-induced neural toxicity; particulate matter embolism and blood vessel occlusion with resultant organ, and/or nervous system infarction; and/or aseptic necrosis of one or more joints. Finally, the patient was informed that Medicine is not an exact science; therefore, there is also the possibility of unforeseen or unpredictable risks and/or possible complications that may result in a catastrophic outcome. The patient indicated having understood very clearly. We have given the patient no guarantees and we have made no promises. Enough time was given to the patient to ask questions, all of which were answered to the patient's satisfaction. Sandra [name] has indicated that she wanted to continue with the procedure. Attestation: I, the ordering provider, attest that I have discussed with  the patient the benefits, risks, side-effects, alternatives, likelihood of achieving goals, and potential problems during recovery for the procedure that I have provided informed consent. Date  Time: 04/05/2019 10:44 AM  Pre-Procedure Preparation:  Monitoring: As per clinic protocol. Respiration, ETCO2, SpO2, BP, heart rate and rhythm monitor placed and checked for adequate function Safety Precautions: Patient was assessed for positional comfort and pressure points before starting the procedure. Time-out: I initiated and conducted the "Time-out" before starting the procedure, as per protocol. The patient was asked to participate by confirming the accuracy of the "Time Out" information. Verification of the correct person, site, and procedure were performed and confirmed by me, the nursing staff, and the patient. "Time-out" conducted as per Joint Commission's Universal Protocol (UP.01.01.01). Time: 1100  Description of Procedure:          Target Area: Lateral epicondyle Approach: Lateral approach. Area Prepped: Entire elbow Region Prepping solution: DuraPrep (Iodine Povacrylex [0.7% available iodine] and Isopropyl Alcohol, 74% w/w) Safety Precautions: Aspiration looking for blood return was conducted prior to all injections. At no point did we inject any substances, as a needle was being advanced. No attempts were made at seeking any paresthesias. Safe injection practices and needle disposal techniques used. Medications properly checked for expiration dates. SDV (single dose vial) medications used. Description of the Procedure: Protocol guidelines were followed. The patient was placed in position. The target area was identified and the area prepped in the usual manner. Skin & deeper tissues infiltrated with local anesthetic. Appropriate amount of time allowed to pass for local anesthetics to take effect. The procedure needles were then advanced to the target area. Proper needle placement secured.  Negative aspiration confirmed. Solution injected in intermittent fashion, asking for systemic symptoms every 0.5cc of injectate. The needles were then removed and the area cleansed, making sure to leave some of the prepping solution back to take advantage of its long term bactericidal properties. Vitals:   04/05/19 1043  BP: (!) 152/81  Pulse: 95  Resp: 16  Temp: 99.4 F (37.4 C)  TempSrc: Oral  SpO2: 100%  Weight: 138 lb (62.6 kg)  Height: 5\' 1"  (1.549 m)    Start Time: 1100 hrs. End Time:  1109 hrs. Materials:  Needle(s) Type: Regular needle Gauge: 25G Length: 1.5-in Medication(s): Please see orders for medications and dosing details. 1 cc solution consisting of 0.5 cc of 0.2% ropivacaine, 0.5 cc of methylprednisolone, 40 mg/cc.  Injected at the lateral epicondyle for the right elbow. Imaging Guidance:          Type of Imaging Technique: None used Indication(s): N/A Exposure Time: No patient exposure Contrast: None used. Fluoroscopic Guidance: N/A Ultrasound Guidance: N/A Interpretation: N/A  Antibiotic Prophylaxis:   Anti-infectives (From admission, onward)   None     Indication(s): None identified  Post-operative Assessment:  Post-procedure Vital Signs:  Pulse/HCG Rate: 95  Temp: 99.4 F (37.4 C) Resp: 16 BP: (!) 152/81 SpO2: 100 %  EBL: None  Complications: No immediate post-treatment complications observed by team, or reported by patient.  Note: The patient tolerated the entire procedure well. A repeat set of vitals were taken after the procedure and the patient was kept under observation following institutional policy, for this type of procedure. Post-procedural neurological assessment was performed, showing return to baseline, prior to discharge. The patient was provided with post-procedure discharge instructions, including a section on how to identify potential problems. Should any problems arise concerning this procedure, the patient was given instructions  to immediately contact us, at any time, without hesitation. In any case, we plan to contact the patient by telephone for a follow-up status report regarding this interventional procedure.  Comments:  No additional relevant information.  Plan of Care  Orders:  No orders of the defined types were placed in this encounter.  Medications ordered for procedure: Meds ordered this encounter  Medications  . lidocaine (XYLOCAINE) 2 % (with pres) injection 400 mg  . ropivacaine (PF) 2 mg/mL (0.2%) (NAROPIN) injection 4 mL  . methylPREDNISolone acetate (DEPO-MEDROL) injection 40 mg  . methylPREDNISolone acetate (DEPO-MEDROL) injection 40 mg   Medications administered: We administered lidocaine, ropivacaine (PF) 2 mg/mL (0.2%), methylPREDNISolone acetate, and methylPREDNISolone acetate.  See the medical record for exact dosing, route, and time of administration.  Follow-up plan:   Return in about 4 weeks (around 05/03/2019) for Post Procedure Evaluation, virtual.     Recent Visits Date Type Provider Dept  03/29/19 Office Visit Gillis Santa, MD [facility]  02/23/19 Office Visit Gillis Santa, MD [facility]  Showing recent visits within past 90 days and meeting all other requirements   Today's Visits Date Type Provider Dept  04/05/19 Procedure visit Gillis Santa, MD [facility]  Showing today's visits and meeting all other requirements   Future Appointments Date Type Provider Dept  05/04/19 Appointment Gillis Santa, MD [facility]  06/22/19 Appointment Gillis Santa, MD [facility]  Showing future appointments within next 90 days and meeting all other requirements   Disposition: Discharge home  Discharge (Date  Time): 04/05/2019; 1110 hrs.   Primary Care Physician: Baxter Hire, MD Location: [facility] Note by: Gillis Santa, MD Date: 04/05/2019; Time: 11:28 AM  Disclaimer:  Medicine is not an  exact science. The only guarantee in medicine is that nothing is guaranteed. It is important to note that the decision to proceed with this intervention was based on the information collected from the patient. The Data and conclusions were drawn from the patient's questionnaire, the interview, and the physical examination. Because the information was provided in large part by the patient, it cannot be guaranteed that it has not been purposely or unconsciously manipulated. Every effort has  been made to obtain as much relevant data as possible for this evaluation. It is important to note that the conclusions that lead to this procedure are derived in large part from the available data. Always take into account that the treatment will also be dependent on availability of resources and existing treatment guidelines, considered by other Pain Management Practitioners as being common knowledge and practice, at the time of the intervention. For Medico-Legal purposes, it is also important to point out that variation in procedural techniques and pharmacological choices are the acceptable norm. The indications, contraindications, technique, and results of the above procedure should only be interpreted and judged by a Board-Certified Interventional Pain Specialist with extensive familiarity and expertise in the same exact procedure and technique.

## 2019-04-06 ENCOUNTER — Telehealth: Payer: Self-pay

## 2019-04-06 NOTE — Telephone Encounter (Signed)
Post procedure phone call.  LM 

## 2019-04-19 DIAGNOSIS — R1314 Dysphagia, pharyngoesophageal phase: Secondary | ICD-10-CM | POA: Diagnosis not present

## 2019-04-19 DIAGNOSIS — H548 Legal blindness, as defined in USA: Secondary | ICD-10-CM | POA: Diagnosis not present

## 2019-04-19 DIAGNOSIS — K219 Gastro-esophageal reflux disease without esophagitis: Secondary | ICD-10-CM | POA: Diagnosis not present

## 2019-04-19 DIAGNOSIS — Z9049 Acquired absence of other specified parts of digestive tract: Secondary | ICD-10-CM | POA: Diagnosis not present

## 2019-04-19 DIAGNOSIS — K5900 Constipation, unspecified: Secondary | ICD-10-CM | POA: Diagnosis not present

## 2019-04-21 ENCOUNTER — Telehealth: Payer: Self-pay | Admitting: Student in an Organized Health Care Education/Training Program

## 2019-04-21 NOTE — Telephone Encounter (Signed)
Pharmacy called for refill for cylobenziprine.

## 2019-04-21 NOTE — Telephone Encounter (Signed)
Patient has appointment 05/04/19.

## 2019-04-22 ENCOUNTER — Telehealth: Payer: Self-pay | Admitting: *Deleted

## 2019-04-22 MED ORDER — CYCLOBENZAPRINE HCL 10 MG PO TABS: 10.0000 mg | ORAL_TABLET | Freq: Three times a day (TID) | ORAL | 2 refills | Status: DC | PRN

## 2019-04-22 NOTE — Telephone Encounter (Signed)
Im not sure why appt is almost 3 weeks past date she should have been able to get refill but her meds are out and she needs refill

## 2019-04-22 NOTE — Telephone Encounter (Signed)
Patient left message asking for refill on flexeril.  She does have an appt on 05/04/19.  Flexeril was last written in November.

## 2019-04-22 NOTE — Telephone Encounter (Signed)
Called to let patient know that flexeril has been sent in.

## 2019-04-23 ENCOUNTER — Other Ambulatory Visit: Payer: Self-pay | Admitting: Student in an Organized Health Care Education/Training Program

## 2019-04-23 DIAGNOSIS — M170 Bilateral primary osteoarthritis of knee: Secondary | ICD-10-CM

## 2019-05-04 ENCOUNTER — Ambulatory Visit: Payer: PPO | Admitting: Student in an Organized Health Care Education/Training Program

## 2019-05-04 DIAGNOSIS — E7800 Pure hypercholesterolemia, unspecified: Secondary | ICD-10-CM | POA: Diagnosis not present

## 2019-05-11 DIAGNOSIS — F331 Major depressive disorder, recurrent, moderate: Secondary | ICD-10-CM | POA: Diagnosis not present

## 2019-05-11 DIAGNOSIS — Z0001 Encounter for general adult medical examination with abnormal findings: Secondary | ICD-10-CM | POA: Diagnosis not present

## 2019-05-11 DIAGNOSIS — E7800 Pure hypercholesterolemia, unspecified: Secondary | ICD-10-CM | POA: Diagnosis not present

## 2019-05-11 DIAGNOSIS — Z0000 Encounter for general adult medical examination without abnormal findings: Secondary | ICD-10-CM | POA: Diagnosis not present

## 2019-05-11 DIAGNOSIS — K219 Gastro-esophageal reflux disease without esophagitis: Secondary | ICD-10-CM | POA: Diagnosis not present

## 2019-05-11 DIAGNOSIS — M546 Pain in thoracic spine: Secondary | ICD-10-CM | POA: Diagnosis not present

## 2019-05-11 DIAGNOSIS — I10 Essential (primary) hypertension: Secondary | ICD-10-CM | POA: Diagnosis not present

## 2019-05-11 DIAGNOSIS — H5316 Psychophysical visual disturbances: Secondary | ICD-10-CM | POA: Diagnosis not present

## 2019-05-17 ENCOUNTER — Encounter: Payer: Self-pay | Admitting: Student in an Organized Health Care Education/Training Program

## 2019-05-17 ENCOUNTER — Ambulatory Visit
Admission: RE | Admit: 2019-05-17 | Discharge: 2019-05-17 | Disposition: A | Discharge status: Home / Self Care | Payer: PPO | Source: Ambulatory Visit | Attending: Internal Medicine | Admitting: Internal Medicine

## 2019-05-17 ENCOUNTER — Telehealth: Payer: Self-pay

## 2019-05-17 DIAGNOSIS — Z1231 Encounter for screening mammogram for malignant neoplasm of breast: Secondary | ICD-10-CM | POA: Insufficient documentation

## 2019-05-17 NOTE — Telephone Encounter (Signed)
Attempted to call patient.  No answer.

## 2019-05-18 ENCOUNTER — Ambulatory Visit (HOSPITAL_BASED_OUTPATIENT_CLINIC_OR_DEPARTMENT_OTHER): Payer: PPO | Admitting: Student in an Organized Health Care Education/Training Program

## 2019-05-18 DIAGNOSIS — M170 Bilateral primary osteoarthritis of knee: Secondary | ICD-10-CM

## 2019-05-24 ENCOUNTER — Ambulatory Visit: Payer: PPO

## 2019-05-28 ENCOUNTER — Telehealth: Payer: Self-pay | Admitting: Student in an Organized Health Care Education/Training Program

## 2019-05-28 NOTE — Telephone Encounter (Signed)
Patient called requesting procedures she has not discussed with Dr. Holley Raring. I set her up a virtual eval.

## 2019-05-31 DIAGNOSIS — Z23 Encounter for immunization: Secondary | ICD-10-CM | POA: Diagnosis not present

## 2019-05-31 DIAGNOSIS — Z7189 Other specified counseling: Secondary | ICD-10-CM | POA: Diagnosis not present

## 2019-06-02 ENCOUNTER — Encounter: Payer: Self-pay | Admitting: Student in an Organized Health Care Education/Training Program

## 2019-06-05 ENCOUNTER — Emergency Department
Admission: EM | Admit: 2019-06-05 | Discharge: 2019-06-05 | Disposition: A | Discharge status: Home / Self Care | Payer: PPO | Attending: Emergency Medicine | Admitting: Emergency Medicine

## 2019-06-05 ENCOUNTER — Emergency Department: Payer: PPO

## 2019-06-05 ENCOUNTER — Other Ambulatory Visit: Payer: Self-pay

## 2019-06-05 DIAGNOSIS — Z79899 Other long term (current) drug therapy: Secondary | ICD-10-CM | POA: Insufficient documentation

## 2019-06-05 DIAGNOSIS — W010XXA Fall on same level from slipping, tripping and stumbling without subsequent striking against object, initial encounter: Secondary | ICD-10-CM | POA: Diagnosis not present

## 2019-06-05 DIAGNOSIS — M25531 Pain in right wrist: Secondary | ICD-10-CM | POA: Insufficient documentation

## 2019-06-05 DIAGNOSIS — M7989 Other specified soft tissue disorders: Secondary | ICD-10-CM | POA: Diagnosis not present

## 2019-06-05 DIAGNOSIS — S300XXA Contusion of lower back and pelvis, initial encounter: Secondary | ICD-10-CM | POA: Insufficient documentation

## 2019-06-05 DIAGNOSIS — Y92018 Other place in single-family (private) house as the place of occurrence of the external cause: Secondary | ICD-10-CM | POA: Diagnosis not present

## 2019-06-05 DIAGNOSIS — Y998 Other external cause status: Secondary | ICD-10-CM | POA: Insufficient documentation

## 2019-06-05 DIAGNOSIS — Z87891 Personal history of nicotine dependence: Secondary | ICD-10-CM | POA: Diagnosis not present

## 2019-06-05 DIAGNOSIS — S3992XA Unspecified injury of lower back, initial encounter: Secondary | ICD-10-CM | POA: Diagnosis not present

## 2019-06-05 DIAGNOSIS — Y9301 Activity, walking, marching and hiking: Secondary | ICD-10-CM | POA: Diagnosis not present

## 2019-06-05 DIAGNOSIS — S3993XA Unspecified injury of pelvis, initial encounter: Secondary | ICD-10-CM | POA: Diagnosis not present

## 2019-06-05 DIAGNOSIS — I10 Essential (primary) hypertension: Secondary | ICD-10-CM | POA: Insufficient documentation

## 2019-06-05 DIAGNOSIS — M549 Dorsalgia, unspecified: Secondary | ICD-10-CM | POA: Diagnosis not present

## 2019-06-05 DIAGNOSIS — S7001XA Contusion of right hip, initial encounter: Secondary | ICD-10-CM

## 2019-06-05 MED ORDER — HYDROCODONE-ACETAMINOPHEN 5-325 MG PO TABS
1.0000 | ORAL_TABLET | ORAL | Status: AC
  Administered 2019-06-05: 1 via ORAL
  Filled 2019-06-05: qty 1

## 2019-06-05 MED ORDER — PREDNISONE 10 MG PO TABS: 10.0000 mg | ORAL_TABLET | Freq: Every day | ORAL | 0 refills | Status: DC

## 2019-06-05 MED ORDER — HYDROXYZINE HCL 25 MG PO TABS
25.0000 mg | ORAL_TABLET | Freq: Once | ORAL | Status: AC
  Administered 2019-06-05: 14:00:00 25 mg via ORAL
  Filled 2019-06-05: qty 1
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
## 2019-06-05 NOTE — Discharge Instructions (Addendum)
Please rest ice and elevate the right wrist, lower back and right hip.  Use wrist brace as needed for comfort.  Follow-up with orthopedics if no improvement in 1 week.  Return to the ER for any worsening symptoms or urgent changes in your health.

## 2019-06-05 NOTE — ED Notes (Signed)
Pt legally blind, no family in exam area with her, unable to sign discharge.  Instructions reviewed with pt and patient escorted to vehicle and instructions reviewed with family.

## 2019-06-05 NOTE — ED Triage Notes (Signed)
Pt arrives to ED c/o of falling today. States hit a chair in living room and fell backwards. C/o back pain and R wrist pain. A&O. In wheelchair. Took tramaol and tylenol.

## 2019-06-07 ENCOUNTER — Ambulatory Visit
Payer: PPO | Attending: Student in an Organized Health Care Education/Training Program | Admitting: Student in an Organized Health Care Education/Training Program

## 2019-06-07 ENCOUNTER — Other Ambulatory Visit: Payer: Self-pay

## 2019-06-07 ENCOUNTER — Encounter: Payer: Self-pay | Admitting: Student in an Organized Health Care Education/Training Program

## 2019-06-07 DIAGNOSIS — G8929 Other chronic pain: Secondary | ICD-10-CM

## 2019-06-07 DIAGNOSIS — G894 Chronic pain syndrome: Secondary | ICD-10-CM | POA: Diagnosis not present

## 2019-06-07 DIAGNOSIS — M5416 Radiculopathy, lumbar region: Secondary | ICD-10-CM | POA: Diagnosis not present

## 2019-06-07 DIAGNOSIS — M19011 Primary osteoarthritis, right shoulder: Secondary | ICD-10-CM | POA: Insufficient documentation

## 2019-06-07 NOTE — Progress Notes (Signed)
Patient: Sandra [name]  Service Category: E/M  Provider: Gillis Santa, MD  DOB: [birth year]  DOS: 06/07/2019  Location: Office  MRN: [id number]  Setting: Ambulatory outpatient  Referring Provider: Baxter Hire, MD  Type: Established Patient  Specialty: Interventional Pain Management  PCP: Baxter Hire, MD  Location: Home  Delivery: TeleHealth     Virtual Encounter - Pain Management PROVIDER NOTE: Information contained herein reflects review and annotations entered in association with encounter. Interpretation of such information and data should be left to medically-trained personnel. Information provided to patient can be located elsewhere in the medical record under "Patient Instructions". Document created using STT-dictation technology, any transcriptional errors that may result from process are unintentional.    Contact & Pharmacy Preferred: [phone] Home: [phone] (home) Mobile: [phone] (mobile) E-mail: No e-mail address on record  [facility], [place] [address] [place] [address] Phone: [phone] Fax: [phone]   Pre-screening  Sandra [name] offered "in-person" vs "virtual" encounter. She indicated preferring virtual for this encounter.   Reason COVID-19*  Social distancing based on CDC and AMA recommendations.   I contacted Sandra [name] on 06/07/2019 via telephone.      I clearly identified myself as Gillis Santa, MD. I verified that I was speaking with the correct person using two identifiers (Name: Malene Blaydes, and date of birth: 1955-11-24).  This visit was completed via telephone due to the restrictions of the COVID-19 pandemic. All issues as above were discussed and addressed but no physical exam was performed. If it was felt that the patient should be evaluated in the office, they were directed there. The patient verbally consented to this visit. Patient was unable to complete an audio/visual visit due to  Technical difficulties and/or Lack of internet. Due to the catastrophic nature of the COVID-19 pandemic, this visit was done through audio contact only.  Location of the patient: home address (see Epic for details)  Location of the provider: office  Consent I sought verbal advanced consent from Sandra [name] for virtual visit interactions. I informed Sandra [name] of possible security and privacy concerns, risks, and limitations associated with providing "not-in-person" medical evaluation and management services. I also informed Sandra [name] of the availability of "in-person" appointments. Finally, I informed her that there would be a charge for the virtual visit and that she could be  personally, fully or partially, financially responsible for it. Ms. Pe expressed understanding and agreed to proceed.   Historic Elements   Sandra [name] is a 64 y.o. year old, female patient evaluated today after her last contact with our practice on 05/28/2019. Sandra [name]  has a past medical history of Anemia, Anxiety (08/01/2014), Arthritis, degenerative (07/30/2013), Clinical depression (06/30/2014), Depression, Elevated liver enzymes, GERD (gastroesophageal reflux disease), H/O breast biopsy (01/05/2015), H/O: attempted suicide (2013), Hepatitis B, Hiatal hernia, History of hysterectomy (01/05/2015), History of peptic ulcer disease (01/05/2015), Hypercholesteremia, Hypertension, Legally blind, Panic attacks, PUD (peptic ulcer disease), Rectocele (01/05/2015), Retinitis pigmentosa, and S/P cholecystectomy (01/05/2015). She also  has a past surgical history that includes Cholecystectomy; Rectocele repair; Abdominal hysterectomy; Appendectomy; Colonoscopy; Esophagogastroduodenoscopy; Sphincterotomy (N/A, 01/24/2017); Hemorrhoid surgery (N/A, 03/07/2017); Hernia repair; Esophagogastroduodenoscopy (egd) with propofol (N/A, 09/17/2017); Oophorectomy; and Breast biopsy (Bilateral, 1977). Sandra [name] has a current  medication list which includes the following prescription(s): acetaminophen, albuterol, amitriptyline, amlodipine, aspirin, cyclobenzaprine, tramadol, alprazolam, amlodipine-benazepril, benzonatate, buspirone, buspirone, escitalopram, esomeprazole, eszopiclone, famotidine, fluticasone, gabapentin, hydrochlorothiazide, lansoprazole, levocetirizine, lidocaine, meclizine, melatonin, montelukast, ondansetron,  prednisone, promethazine, simvastatin, temazepam, and vitamin b-12. She  reports that she quit smoking about 45 years ago. She has never used smokeless tobacco. She reports that she does not drink alcohol or use drugs. Sandra [name] is allergic to trazodone; acetaminophen-codeine; codeine; oxycontin [oxycodone hcl]; trazodone and nefazodone; vicodin [hydrocodone-acetaminophen]; zolpidem; hydrocodone-acetaminophen; and oxycodone-acetaminophen.   HPI  Today, she is being contacted for new problems.  S/p fall on Saturday afternoon.   ED note below : Fall occurred yesterday afternoon.  Patient has a history of retinitis pigmentosa and is legally blind.  She also has a history of osteoporosis.  She was walking through her home when she tripped over the chair, fell backwards into the door.  She injured her right wrist, has had a lot of pain and swelling throughout the wrist.  She also has some soreness in her lower back and left and right buttocks.  No numbness tingling or radicular symptoms.  No lower extremity discomfort.  She denies any abdominal pain, chest pain or shortness of breath.  No headache, head injury, neck pain, nausea, vomiting, numbness, tingling or radicular symptoms.  She has taken some tramadol, 2 tablets as well as Tylenol with very little relief of her right wrist pain.  Patient endorses right shoulder pain that is worse with abduction.  It is most pronounced in the anterior portion of her shoulder.  Patient does have a history of right glenohumeral arthritis.  She is also having low back  pain which radiates into her right lower extremity in a dermatomal fashion stopping usually on her right knee.  She has had lumbar epidural steroid injections in the past and they were helpful.  We discussed repeating a lumbar epidural steroid injection for her radicular pain flare and doing a right intra-articular glenohumeral shoulder joint injection.  Patient in agreement.  UDS:  Summary  Date Value Ref Range Status  11/24/2018 Note  Final    Comment:    ==================================================================== [employer] Select 13 (MW) ==================================================================== Specimen Alert Note: Urinary creatinine is low; ability to detect some drugs may be compromised. Interpret results with caution. (Creatinine) ==================================================================== Test                             Result       Flag       Units Drug Present and Declared for Prescription Verification   Oxazepam                       627          EXPECTED   ng/mg creat   Temazepam                      5820         EXPECTED   ng/mg creat    Oxazepam and temazepam are expected metabolites of diazepam.    Oxazepam is also an expected metabolite of other benzodiazepine    drugs, including chlordiazepoxide, prazepam, clorazepate, halazepam,    and temazepam.  Oxazepam and temazepam are available as scheduled    prescription medications.   Tramadol                       >33333       EXPECTED   ng/mg creat   O-Desmethyltramadol            >33333       EXPECTED  ng/mg creat   N-Desmethyltramadol            16893        EXPECTED   ng/mg creat    Source of tramadol is a prescription medication. O-desmethyltramadol    and N-desmethyltramadol are expected metabolites of tramadol. Drug Absent but Declared for Prescription Verification   Alprazolam                     Not Detected UNEXPECTED ng/mg  creat ==================================================================== Test                      Result    Flag   Units      Ref Range   Creatinine              15        LL     mg/dL      >=20 ==================================================================== Declared Medications:  The flagging and interpretation on this report are based on the  following declared medications.  Unexpected results may arise from  inaccuracies in the declared medications.  **Note: The testing scope of this panel includes these medications:  Alprazolam  Temazepam  Tramadol  **Note: The testing scope of this panel does not include the  following reported medications:  Acetaminophen  Albuterol  Amlodipine  Aspirin  Benazepril  Benzonatate  Buspirone  Cyanocobalamin  Cyclobenzaprine  Escitalopram (Lexapro)  Eszopiclone  Fluticasone  Gabapentin  Hydrochlorothiazide  Lansoprazole  Levocetirizine  Lidocaine  Meclizine  Melatonin  Montelukast  Ondansetron  Simvastatin ==================================================================== For clinical consultation, please call [phone]. ====================================================================    Laboratory Chemistry Profile   Renal Lab Results  Component Value Date   BUN 18 01/16/2017   CREATININE 0.75 01/16/2017   GFRAA >60 01/16/2017   GFRNONAA >60 01/16/2017     Hepatic Lab Results  Component Value Date   AST 24 05/13/2015   ALT 13 (L) 05/13/2015   ALBUMIN 4.5 05/13/2015   ALKPHOS 75 05/13/2015     Electrolytes Lab Results  Component Value Date   NA 138 01/16/2017   K 4.2 01/16/2017   CL 101 01/16/2017   CALCIUM 9.4 01/16/2017   MG 2.0 02/07/2015     Bone Lab Results  Component Value Date   25OHVITD1 19 (L) 02/07/2015   25OHVITD2 3.9 02/07/2015   25OHVITD3 15 02/07/2015     Inflammation (CRP: Acute Phase) (ESR: Chronic Phase) Lab Results  Component Value Date   CRP <0.5 02/07/2015    ESRSEDRATE 26 02/07/2015       Note: Above Lab results reviewed.  Imaging  DG Pelvis 1-2 Views CLINICAL DATA:  Fall, back pain.  EXAM: PELVIS - 1-2 VIEW  COMPARISON:  07/24/2010  FINDINGS: No pelvic fracture is identified. No acute bony findings.  IMPRESSION: Negative.  Electronically Signed   By: Van Clines M.D.   On: 06/05/2019 14:43 DG Lumbar Spine 2-3 Views CLINICAL DATA:  Fall. Back pain.  EXAM: LUMBAR SPINE - 2-3 VIEW  COMPARISON:  MRI lumbar spine from 12/15/2013  FINDINGS: Bony demineralization. No lumbar spine fracture or acute subluxation is identified. Preserved intervertebral disc height.  IMPRESSION: Bony demineralization. No fracture or acute subluxation identified.  Electronically Signed   By: Van Clines M.D.   On: 06/05/2019 14:42 DG Wrist Complete Right CLINICAL DATA:  Fall, pain, and swelling.  EXAM: RIGHT WRIST - COMPLETE 3+ VIEW  COMPARISON:  None.  FINDINGS: Old transverse fracture of the ulnar styloid. Arthropathy  between the scaphoid and trapezium with spurring and cortical irregularity. Poor definition of the second through fourth carpometacarpal articulations due to obliquity of projections. There is spurring of the distal head of the thumb metacarpal.  The pronator fat pad appears normal.  IMPRESSION: 1. No fracture or acute bony finding identified. If pain persist despite conservative therapy, cross-sectional imaging may be warranted. 2. Old nonunited fracture of the ulnar styloid. 3. Arthropathy between the scaphoid and trapezium.  Electronically Signed   By: Van Clines M.D.   On: 06/05/2019 14:40  Assessment  The primary encounter diagnosis was Chronic radicular lumbar pain. Diagnoses of Localized osteoarthritis of right shoulder and Chronic pain syndrome were also pertinent to this visit.  Plan of Care  Sandra [name] has a current medication list which includes the following long-term  medication(s): albuterol, amitriptyline, cyclobenzaprine, escitalopram, esomeprazole, eszopiclone, famotidine, fluticasone, hydrochlorothiazide, levocetirizine, montelukast, promethazine, and simvastatin.  1. Chronic radicular lumbar pain - Lumbar Epidural Injection; Future  2. Localized osteoarthritis of right shoulder - SHOULDER INJECTION; Future  3. Chronic pain syndrome - Medication management as prescribed, medication refill appointment in early May. - Lumbar Epidural Injection; Future - SHOULDER INJECTION; Future   Orders:  Orders Placed This Encounter  Procedures  . Lumbar Epidural Injection    Standing Status:   Future    Standing Expiration Date:   07/07/2019    Scheduling Instructions:     Procedure: Interlaminar Lumbar Epidural Steroid injection (LESI)            Laterality: Midline     Sedation: without     Timeframe: ASAA    Order Specific Question:   Where will this procedure be performed?    Answer:   [facility]  . SHOULDER INJECTION    Standing Status:   Future    Standing Expiration Date:   07/07/2019    Scheduling Instructions:     Side: right     Sedation: Without Sedation.     Timeframe: As soon as schedule allows    Order Specific Question:   Where will this procedure be performed?    Answer:   [facility]    Comments:      Follow-up plan:   Return in about 1 week (around 06/14/2019) for L-ESI + R shoulder , without sedation.     Status post bilateral intra-articular knee steroid, right elbow injection on 04/05/2019    Recent Visits Date Type Provider Dept  04/05/19 Procedure visit Gillis Santa, MD [facility]  03/29/19 Office Visit Gillis Santa, MD [facility]  Showing recent visits within past 90 days and meeting all other requirements   Today's Visits Date Type Provider Dept  06/07/19 Telemedicine Gillis Santa, MD [facility]  Showing today's visits and meeting all other requirements    Future Appointments Date Type Provider Dept  06/22/19 Appointment Gillis Santa, MD [facility]  Showing future appointments within next 90 days and meeting all other requirements   I discussed the assessment and treatment plan with the patient. The patient was provided an opportunity to ask questions and all were answered. The patient agreed with the plan and demonstrated an understanding of the instructions.  Patient advised to call back or seek an in-person evaluation if the symptoms or condition worsens.  Duration of encounter:47mnutes.  Note by: BGillis Santa MD Date: 06/07/2019; Time: 1:31 PM

## 2019-06-14 ENCOUNTER — Ambulatory Visit: Payer: PPO | Admitting: Student in an Organized Health Care Education/Training Program

## 2019-06-16 ENCOUNTER — Other Ambulatory Visit: Payer: Self-pay

## 2019-06-16 ENCOUNTER — Ambulatory Visit (HOSPITAL_BASED_OUTPATIENT_CLINIC_OR_DEPARTMENT_OTHER): Payer: PPO | Admitting: Student in an Organized Health Care Education/Training Program

## 2019-06-16 ENCOUNTER — Ambulatory Visit
Admission: RE | Admit: 2019-06-16 | Discharge: 2019-06-16 | Disposition: A | Discharge status: Home / Self Care | Payer: PPO | Source: Ambulatory Visit | Attending: Student in an Organized Health Care Education/Training Program | Admitting: Student in an Organized Health Care Education/Training Program

## 2019-06-16 ENCOUNTER — Encounter: Payer: Self-pay | Admitting: Student in an Organized Health Care Education/Training Program

## 2019-06-16 VITALS — BP 147/65 | HR 71 | Temp 98.9°F | Resp 20 | Ht 61.0 in | Wt 138.0 lb

## 2019-06-16 DIAGNOSIS — M5416 Radiculopathy, lumbar region: Secondary | ICD-10-CM

## 2019-06-16 DIAGNOSIS — M19011 Primary osteoarthritis, right shoulder: Secondary | ICD-10-CM | POA: Insufficient documentation

## 2019-06-16 DIAGNOSIS — G8929 Other chronic pain: Secondary | ICD-10-CM | POA: Diagnosis not present

## 2019-06-16 DIAGNOSIS — G894 Chronic pain syndrome: Secondary | ICD-10-CM | POA: Insufficient documentation

## 2019-06-16 MED ORDER — GABAPENTIN 400 MG PO CAPS: 400.0000 mg | ORAL_CAPSULE | Freq: Two times a day (BID) | ORAL | 2 refills | Status: DC

## 2019-06-16 MED ORDER — TRAMADOL HCL 50 MG PO TABS: 100.0000 mg | ORAL_TABLET | Freq: Two times a day (BID) | ORAL | 3 refills | Status: DC | PRN

## 2019-06-16 MED ORDER — FENTANYL CITRATE (PF) 100 MCG/2ML IJ SOLN
25.0000 ug | INTRAMUSCULAR | Status: DC | PRN
  Administered 2019-06-16: 100 ug via INTRAVENOUS

## 2019-06-16 MED ORDER — FENTANYL CITRATE (PF) 100 MCG/2ML IJ SOLN
INTRAMUSCULAR | Status: AC
  Filled 2019-06-16: qty 2

## 2019-06-16 MED ORDER — ROPIVACAINE HCL 2 MG/ML IJ SOLN
2.0000 mL | Freq: Once | INTRAMUSCULAR | Status: AC
  Administered 2019-06-16: 2 mL via EPIDURAL

## 2019-06-16 MED ORDER — DEXAMETHASONE SODIUM PHOSPHATE 10 MG/ML IJ SOLN
INTRAMUSCULAR | Status: AC
  Filled 2019-06-16: qty 1

## 2019-06-16 MED ORDER — METHYLPREDNISOLONE ACETATE 40 MG/ML IJ SUSP
40.0000 mg | Freq: Once | INTRAMUSCULAR | Status: AC
  Administered 2019-06-16: 40 mg via INTRA_ARTICULAR

## 2019-06-16 MED ORDER — SODIUM CHLORIDE (PF) 0.9 % IJ SOLN
INTRAMUSCULAR | Status: AC
  Filled 2019-06-16: qty 10

## 2019-06-16 MED ORDER — ROPIVACAINE HCL 2 MG/ML IJ SOLN
4.0000 mL | Freq: Once | INTRAMUSCULAR | Status: AC
  Administered 2019-06-16: 4 mL via INTRA_ARTICULAR

## 2019-06-16 MED ORDER — METHYLPREDNISOLONE ACETATE 40 MG/ML IJ SUSP
INTRAMUSCULAR | Status: AC
  Filled 2019-06-16: qty 1

## 2019-06-16 MED ORDER — ROPIVACAINE HCL 2 MG/ML IJ SOLN
INTRAMUSCULAR | Status: AC
  Filled 2019-06-16: qty 10

## 2019-06-16 MED ORDER — DEXAMETHASONE SODIUM PHOSPHATE 10 MG/ML IJ SOLN
10.0000 mg | Freq: Once | INTRAMUSCULAR | Status: AC
  Administered 2019-06-16: 10 mg

## 2019-06-16 MED ORDER — SODIUM CHLORIDE 0.9% FLUSH
2.0000 mL | Freq: Once | INTRAVENOUS | Status: AC
  Administered 2019-06-16: 2 mL

## 2019-06-16 MED ORDER — CYCLOBENZAPRINE HCL 10 MG PO TABS: 10.0000 mg | ORAL_TABLET | Freq: Three times a day (TID) | ORAL | 2 refills | Status: DC | PRN

## 2019-06-16 MED ORDER — LIDOCAINE HCL 2 % IJ SOLN
20.0000 mL | Freq: Once | INTRAMUSCULAR | Status: AC
  Administered 2019-06-16: 400 mg
  Filled 2019-06-16: qty 40

## 2019-06-16 MED ORDER — IOHEXOL 180 MG/ML  SOLN
10.0000 mL | Freq: Once | INTRAMUSCULAR | Status: AC
  Administered 2019-06-16: 10 mL via EPIDURAL
  Filled 2019-06-16: qty 20

## 2019-06-16 NOTE — Patient Instructions (Signed)

## 2019-06-16 NOTE — Progress Notes (Signed)
Safety precautions to be maintained throughout the outpatient stay will include: orient to surroundings, keep bed in low position, maintain call bell within reach at all times, provide assistance with transfer out of bed and ambulation.  

## 2019-06-16 NOTE — Progress Notes (Signed)
PROVIDER NOTE: Information contained herein reflects review and annotations entered in association with encounter. Interpretation of such information and data should be left to medically-trained personnel. Information provided to patient can be located elsewhere in the medical record under "Patient Instructions". Document created using STT-dictation technology, any transcriptional errors that may result from process are unintentional.    Patient: Sandra [name]  Service Category: Procedure  Provider: Gillis Santa, MD  DOB: [birth year]  DOS: 06/16/2019  Location: [facility]  MRN: [id number]  Setting: Ambulatory - outpatient  Referring Provider: Baxter Hire, MD  Type: Established Patient  Specialty: Interventional Pain Management  PCP: Baxter Hire, MD   Primary Reason for Visit: Interventional Pain Management Treatment. CC: right shoulder pain  Procedure:          Anesthesia, Analgesia, Anxiolysis:  Type: Diagnostic Glenohumeral Joint (shoulder) Injection #1  Primary Purpose: Diagnostic Region: Posterior Shoulder Area Level:  Shoulder Target Area: Glenohumeral Joint (shoulder) Approach: Posterior approach. Laterality: Right  Type: Moderate (Conscious) Sedation combined with Local Anesthesia Indication(s): Analgesia and Anxiety Route: Intravenous (IV) IV Access: Secured Sedation: Meaningful verbal contact was maintained at all times during the procedure  Local Anesthetic: Lidocaine 1-2%  Position: Supine   Indications: Right shoulder OA  Pain Score: Pre-procedure: 7 /10 Post-procedure: 5 (right shoulder and lower back )/10   Pre-op Assessment:  Sandra [name] is a 64 y.o. (year old), female patient, seen today for interventional treatment. She  has a past surgical history that includes Cholecystectomy; Rectocele repair; Abdominal hysterectomy; Appendectomy; Colonoscopy; Esophagogastroduodenoscopy; Sphincterotomy (N/A, 01/24/2017); Hemorrhoid surgery (N/A,  03/07/2017); Hernia repair; Esophagogastroduodenoscopy (egd) with propofol (N/A, 09/17/2017); Oophorectomy; and Breast biopsy (Bilateral, 1977). Sandra [name] has a current medication list which includes the following prescription(s): acetaminophen, albuterol, amitriptyline, amlodipine, amlodipine-benazepril, aspirin, buspirone, escitalopram, esomeprazole, famotidine, fluticasone, gabapentin, hydrochlorothiazide, lansoprazole, levocetirizine, meclizine, melatonin, montelukast, ondansetron, promethazine, simvastatin, temazepam, vitamin b-12, alprazolam, benzonatate, buspirone, cyclobenzaprine, eszopiclone, lidocaine, prednisone, and tramadol, and the following Facility-Administered Medications: fentanyl. Her primarily concern today is the Back Pain  Initial Vital Signs:  Pulse/HCG Rate: 71ECG Heart Rate: 92 Temp: 97.7 F (36.5 C) Resp: 17 BP: (!) 154/84 SpO2: 99 %  BMI: Estimated body mass index is 26.07 kg/m as calculated from the following:   Height as of this encounter: 5\' 1"  (1.549 m).   Weight as of this encounter: 138 lb (62.6 kg).  Risk Assessment: Allergies: Reviewed. She is allergic to trazodone; acetaminophen-codeine; codeine; oxycontin [oxycodone hcl]; trazodone and nefazodone; vicodin [hydrocodone-acetaminophen]; zolpidem; hydrocodone-acetaminophen; and oxycodone-acetaminophen.  Allergy Precautions: None required Coagulopathies: Reviewed. None identified.  Blood-thinner therapy: None at this time Active Infection(s): Reviewed. None identified. Ms. Granda is afebrile  Site Confirmation: Sandra [name] was asked to confirm the procedure and laterality before marking the site Procedure checklist: Completed Consent: Before the procedure and under the influence of no sedative(s), amnesic(s), or anxiolytics, the patient was informed of the treatment options, risks and possible complications. To fulfill our ethical and legal obligations, as recommended by the American Medical Association's  Code of Ethics, I have informed the patient of my clinical impression; the nature and purpose of the treatment or procedure; the risks, benefits, and possible complications of the intervention; the alternatives, including doing nothing; the risk(s) and benefit(s) of the alternative treatment(s) or procedure(s); and the risk(s) and benefit(s) of doing nothing. The patient was provided information about the general risks and possible complications associated with the procedure. These may include, but are not limited to: failure to achieve desired goals, infection,  bleeding, organ or nerve damage, allergic reactions, paralysis, and death. In addition, the patient was informed of those risks and complications associated to the procedure, such as failure to decrease pain; infection; bleeding; organ or nerve damage with subsequent damage to sensory, motor, and/or autonomic systems, resulting in permanent pain, numbness, and/or weakness of one or several areas of the body; allergic reactions; (i.e.: anaphylactic reaction); and/or death. Furthermore, the patient was informed of those risks and complications associated with the medications. These include, but are not limited to: allergic reactions (i.e.: anaphylactic or anaphylactoid reaction(s)); adrenal axis suppression; blood sugar elevation that in diabetics may result in ketoacidosis or comma; water retention that in patients with history of congestive heart failure may result in shortness of breath, pulmonary edema, and decompensation with resultant heart failure; weight gain; swelling or edema; medication-induced neural toxicity; particulate matter embolism and blood vessel occlusion with resultant organ, and/or nervous system infarction; and/or aseptic necrosis of one or more joints. Finally, the patient was informed that Medicine is not an exact science; therefore, there is also the possibility of unforeseen or unpredictable risks and/or possible complications  that may result in a catastrophic outcome. The patient indicated having understood very clearly. We have given the patient no guarantees and we have made no promises. Enough time was given to the patient to ask questions, all of which were answered to the patient's satisfaction. Ms. Spiva has indicated that she wanted to continue with the procedure. Attestation: I, the ordering provider, attest that I have discussed with the patient the benefits, risks, side-effects, alternatives, likelihood of achieving goals, and potential problems during recovery for the procedure that I have provided informed consent. Date  Time: 06/16/2019 10:17 AM  Pre-Procedure Preparation:  Monitoring: As per clinic protocol. Respiration, ETCO2, SpO2, BP, heart rate and rhythm monitor placed and checked for adequate function Safety Precautions: Patient was assessed for positional comfort and pressure points before starting the procedure. Time-out: I initiated and conducted the "Time-out" before starting the procedure, as per protocol. The patient was asked to participate by confirming the accuracy of the "Time Out" information. Verification of the correct person, site, and procedure were performed and confirmed by me, the nursing staff, and the patient. "Time-out" conducted as per Joint Commission's Universal Protocol (UP.01.01.01). Time: 1126  Description of Procedure:          Area Prepped: Entire shoulder Area DuraPrep (Iodine Povacrylex [0.7% available iodine] and Isopropyl Alcohol, 74% w/w) Safety Precautions: Aspiration looking for blood return was conducted prior to all injections. At no point did we inject any substances, as a needle was being advanced. No attempts were made at seeking any paresthesias. Safe injection practices and needle disposal techniques used. Medications properly checked for expiration dates. SDV (single dose vial) medications used. Description of the Procedure: Protocol guidelines were  followed. The patient was placed in position over the procedure table. The target area was identified and the area prepped in the usual manner. Skin & deeper tissues infiltrated with local anesthetic. Appropriate amount of time allowed to pass for local anesthetics to take effect. The procedure needles were then advanced to the target area. Proper needle placement secured. Negative aspiration confirmed. Solution injected in intermittent fashion, asking for systemic symptoms every 0.5cc of injectate. The needles were then removed and the area cleansed, making sure to leave some of the prepping solution back to take advantage of its long term bactericidal properties.         Vitals:   06/16/19 1130  06/16/19 1140 06/16/19 1150 06/16/19 1159  BP: (!) 143/82 128/60 136/75 (!) 147/65  Pulse:      Resp: 18 19 (!) 30 20  Temp:  (!) 97.4 F (36.3 C)  98.9 F (37.2 C)  TempSrc:  Temporal  Temporal  SpO2: 95% 99% 100% 100%  Weight:      Height:        Start Time: 1126 hrs. End Time: 1130 hrs. Materials:  Needle(s) Type: Spinal Needle Gauge: 22G Length: 3.5-in Medication(s): Please see orders for medications and dosing details.  5 cc solution made of 4 cc of 0.2% ropivacaine, 1 cc of methylprednisolone 40 mg/cc.  Imaging Guidance (Non-Spinal):          Type of Imaging Technique: Fluoroscopy Guidance (Non-Spinal) Indication(s): Assistance in needle guidance and placement for procedures requiring needle placement in or near specific anatomical locations not easily accessible without such assistance. Exposure Time: Please see nurses notes. Contrast: Before injecting any contrast, we confirmed that the patient did not have an allergy to iodine, shellfish, or radiological contrast. Once satisfactory needle placement was completed at the desired level, radiological contrast was injected. Contrast injected under live fluoroscopy. No contrast complications. See chart for type and volume of contrast  used. Fluoroscopic Guidance: I was personally present during the use of fluoroscopy. "Tunnel Vision Technique" used to obtain the best possible view of the target area. Parallax error corrected before commencing the procedure. "Direction-depth-direction" technique used to introduce the needle under continuous pulsed fluoroscopy. Once target was reached, antero-posterior, oblique, and lateral fluoroscopic projection used confirm needle placement in all planes. Images permanently stored in EMR. Interpretation: I personally interpreted the imaging intraoperatively. Adequate needle placement confirmed in multiple planes. Appropriate spread of contrast into desired area was observed. No evidence of afferent or efferent intravascular uptake. Permanent images saved into the patient's record.  Antibiotic Prophylaxis:   Anti-infectives (From admission, onward)   None     Indication(s): None identified  Post-operative Assessment:  Post-procedure Vital Signs:  Pulse/HCG Rate: 7176 Temp: 98.9 F (37.2 C) Resp: 20 BP: (!) 147/65 SpO2: 100 %  EBL: None  Complications: No immediate post-treatment complications observed by team, or reported by patient.  Note: The patient tolerated the entire procedure well. A repeat set of vitals were taken after the procedure and the patient was kept under observation following institutional policy, for this type of procedure. Post-procedural neurological assessment was performed, showing return to baseline, prior to discharge. The patient was provided with post-procedure discharge instructions, including a section on how to identify potential problems. Should any problems arise concerning this procedure, the patient was given instructions to immediately contact us, at any time, without hesitation. In any case, we plan to contact the patient by telephone for a follow-up status report regarding this interventional procedure.  Comments:  No additional relevant  information.  Plan of Care  Orders:  Orders Placed This Encounter  Procedures  . [facility] C-ARM 1-60 MIN NO REPORT    Intraoperative interpretation by procedural physician at [place].    Standing Status:   Standing    Number of Occurrences:   1    Order Specific Question:   Reason for exam:    Answer:   Assistance in needle guidance and placement for procedures requiring needle placement in or near specific anatomical locations not easily accessible without such assistance.   Medications ordered for procedure: Meds ordered this encounter  Medications  . iohexol (OMNIPAQUE) 180 MG/ML injection 10  mL    Must be Myelogram-compatible. If not available, you may substitute with a water-soluble, non-ionic, hypoallergenic, myelogram-compatible radiological contrast medium.  Marland Kitchen lidocaine (XYLOCAINE) 2 % (with pres) injection 400 mg  . ropivacaine (PF) 2 mg/mL (0.2%) (NAROPIN) injection 2 mL  . sodium chloride flush (NS) 0.9 % injection 2 mL  . dexamethasone (DECADRON) injection 10 mg  . ropivacaine (PF) 2 mg/mL (0.2%) (NAROPIN) injection 4 mL  . methylPREDNISolone acetate (DEPO-MEDROL) injection 40 mg  . traMADol (ULTRAM) 50 MG tablet    Sig: Take 2 tablets (100 mg total) by mouth every 12 (twelve) hours as needed for severe pain. For chronic pain syndrome    Dispense:  120 tablet    Refill:  3  . cyclobenzaprine (FLEXERIL) 10 MG tablet    Sig: Take 1 tablet (10 mg total) by mouth 3 (three) times daily as needed for muscle spasms.    Dispense:  90 tablet    Refill:  2  . fentaNYL (SUBLIMAZE) injection 25-50 mcg    Make sure Narcan is available in the pyxis when using this medication. In the event of respiratory depression (RR< 8/min): Titrate NARCAN (naloxone) in increments of 0.1 to 0.2 mg IV at 2-3 minute intervals, until desired degree of reversal.  . gabapentin (NEURONTIN) 400 MG capsule    Sig: Take 1 capsule (400 mg total) by mouth 2 (two) times daily. 400 mg tid     Dispense:  60 capsule    Refill:  2   Medications administered: We administered iohexol, lidocaine, ropivacaine (PF) 2 mg/mL (0.2%), sodium chloride flush, dexamethasone, ropivacaine (PF) 2 mg/mL (0.2%), methylPREDNISolone acetate, and fentaNYL.  See the medical record for exact dosing, route, and time of administration.  Follow-up plan:   Return in about 4 weeks (around 07/14/2019) for Post Procedure Evaluation, virtual.    Recent Visits Date Type Provider Dept  06/07/19 Telemedicine Gillis Santa, MD [facility]  04/05/19 Procedure visit Gillis Santa, MD [facility]  03/29/19 Office Visit Gillis Santa, MD [facility]  Showing recent visits within past 90 days and meeting all other requirements   Today's Visits Date Type Provider Dept  06/16/19 Procedure visit Gillis Santa, MD [facility]  Showing today's visits and meeting all other requirements   Future Appointments Date Type Provider Dept  06/22/19 Appointment Gillis Santa, MD [facility]  Showing future appointments within next 90 days and meeting all other requirements   Disposition: Discharge home  Discharge (Date  Time): 06/16/2019; 1201 hrs.   Primary Care Physician: Baxter Hire, MD Location: [facility] Note by: Gillis Santa, MD Date: 06/16/2019; Time: 12:59 PM  Disclaimer:  Medicine is not an exact science. The only guarantee in medicine is that nothing is guaranteed. It is important to note that the decision to proceed with this intervention was based on the information collected from the patient. The Data and conclusions were drawn from the patient's questionnaire, the interview, and the physical examination. Because the information was provided in large part by the patient, it cannot be guaranteed that it has not been purposely or unconsciously manipulated. Every effort has been made to obtain as much relevant data as possible  for this evaluation. It is important to note that the conclusions that lead to this procedure are derived in large part from the available data. Always take into account that the treatment will also be dependent on availability of resources and existing treatment guidelines, considered by  other Pain Management Practitioners as being common knowledge and practice, at the time of the intervention. For Medico-Legal purposes, it is also important to point out that variation in procedural techniques and pharmacological choices are the acceptable norm. The indications, contraindications, technique, and results of the above procedure should only be interpreted and judged by a Board-Certified Interventional Pain Specialist with extensive familiarity and expertise in the same exact procedure and technique.

## 2019-06-16 NOTE — Progress Notes (Signed)
PROVIDER NOTE: Information contained herein reflects review and annotations entered in association with encounter. Interpretation of such information and data should be left to medically-trained personnel. Information provided to patient can be located elsewhere in the medical record under "Patient Instructions". Document created using STT-dictation technology, any transcriptional errors that may result from process are unintentional.    Patient: Sandra [name]  Service Category: Procedure  Provider: Gillis Santa, MD  DOB: 03/02/1955  DOS: 06/16/2019  Location: [facility]  MRN: [id number]  Setting: Ambulatory - outpatient  Referring Provider: Baxter Hire, MD  Type: Established Patient  Specialty: Interventional Pain Management  PCP: Baxter Hire, MD   Primary Reason for Visit: Interventional Pain Management Treatment. CC: Back Pain  Procedure:          Anesthesia, Analgesia, Anxiolysis:  Type: Therapeutic Inter-Laminar Epidural Steroid Injection  #1  Region: Lumbar Level: L4-5 Level. Laterality: Right-Sided         Type: Moderate (Conscious) Sedation combined with Local Anesthesia Indication(s): Analgesia and Anxiety Route: Intravenous (IV) IV Access: Secured Sedation: Meaningful verbal contact was maintained at all times during the procedure  Local Anesthetic: Lidocaine 1-2%  Position: Prone with head of the table was raised to facilitate breathing.   Indications: 1. Chronic radicular lumbar pain   2. Localized osteoarthritis of right shoulder   3. Chronic pain syndrome    Pain Score: Pre-procedure: 7 /10 Post-procedure: 5 (right shoulder and lower back )/10   Pre-op Assessment:  Ms. Warshawsky is a 64 y.o. (year old), female patient, seen today for interventional treatment. She  has a past surgical history that includes Cholecystectomy; Rectocele repair; Abdominal hysterectomy; Appendectomy; Colonoscopy; Esophagogastroduodenoscopy; Sphincterotomy (N/A,  01/24/2017); Hemorrhoid surgery (N/A, 03/07/2017); Hernia repair; Esophagogastroduodenoscopy (egd) with propofol (N/A, 09/17/2017); Oophorectomy; and Breast biopsy (Bilateral, 1977). Ms. Endress has a current medication list which includes the following prescription(s): acetaminophen, albuterol, amitriptyline, amlodipine, amlodipine-benazepril, aspirin, buspirone, escitalopram, esomeprazole, famotidine, fluticasone, gabapentin, hydrochlorothiazide, lansoprazole, levocetirizine, meclizine, melatonin, montelukast, ondansetron, promethazine, simvastatin, temazepam, vitamin b-12, alprazolam, benzonatate, buspirone, cyclobenzaprine, eszopiclone, lidocaine, prednisone, and tramadol, and the following Facility-Administered Medications: fentanyl. Her primarily concern today is the Back Pain  Initial Vital Signs:  Pulse/HCG Rate: 71ECG Heart Rate: 92 Temp: 97.7 F (36.5 C) Resp: 17 BP: (!) 154/84 SpO2: 99 %  BMI: Estimated body mass index is 26.07 kg/m as calculated from the following:   Height as of this encounter: 5\' 1"  (1.549 m).   Weight as of this encounter: 138 lb (62.6 kg).  Risk Assessment: Allergies: Reviewed. She is allergic to trazodone; acetaminophen-codeine; codeine; oxycontin [oxycodone hcl]; trazodone and nefazodone; vicodin [hydrocodone-acetaminophen]; zolpidem; hydrocodone-acetaminophen; and oxycodone-acetaminophen.  Allergy Precautions: None required Coagulopathies: Reviewed. None identified.  Blood-thinner therapy: None at this time Active Infection(s): Reviewed. None identified. Ms. Papalia is afebrile  Site Confirmation: Ms. Chapnick was asked to confirm the procedure and laterality before marking the site Procedure checklist: Completed Consent: Before the procedure and under the influence of no sedative(s), amnesic(s), or anxiolytics, the patient was informed of the treatment options, risks and possible complications. To fulfill our ethical and legal obligations, as recommended by  the American Medical Association's Code of Ethics, I have informed the patient of my clinical impression; the nature and purpose of the treatment or procedure; the risks, benefits, and possible complications of the intervention; the alternatives, including doing nothing; the risk(s) and benefit(s) of the alternative treatment(s) or procedure(s); and the risk(s) and benefit(s) of doing nothing. The patient was provided information about the general  risks and possible complications associated with the procedure. These may include, but are not limited to: failure to achieve desired goals, infection, bleeding, organ or nerve damage, allergic reactions, paralysis, and death. In addition, the patient was informed of those risks and complications associated to Spine-related procedures, such as failure to decrease pain; infection (i.e.: Meningitis, epidural or intraspinal abscess); bleeding (i.e.: epidural hematoma, subarachnoid hemorrhage, or any other type of intraspinal or peri-dural bleeding); organ or nerve damage (i.e.: Any type of peripheral nerve, nerve root, or spinal cord injury) with subsequent damage to sensory, motor, and/or autonomic systems, resulting in permanent pain, numbness, and/or weakness of one or several areas of the body; allergic reactions; (i.e.: anaphylactic reaction); and/or death. Furthermore, the patient was informed of those risks and complications associated with the medications. These include, but are not limited to: allergic reactions (i.e.: anaphylactic or anaphylactoid reaction(s)); adrenal axis suppression; blood sugar elevation that in diabetics may result in ketoacidosis or comma; water retention that in patients with history of congestive heart failure may result in shortness of breath, pulmonary edema, and decompensation with resultant heart failure; weight gain; swelling or edema; medication-induced neural toxicity; particulate matter embolism and blood vessel occlusion with  resultant organ, and/or nervous system infarction; and/or aseptic necrosis of one or more joints. Finally, the patient was informed that Medicine is not an exact science; therefore, there is also the possibility of unforeseen or unpredictable risks and/or possible complications that may result in a catastrophic outcome. The patient indicated having understood very clearly. We have given the patient no guarantees and we have made no promises. Enough time was given to the patient to ask questions, all of which were answered to the patient's satisfaction. Ms. Perdue has indicated that she wanted to continue with the procedure. Attestation: I, the ordering provider, attest that I have discussed with the patient the benefits, risks, side-effects, alternatives, likelihood of achieving goals, and potential problems during recovery for the procedure that I have provided informed consent. Date  Time: 06/16/2019 10:17 AM  Pre-Procedure Preparation:  Monitoring: As per clinic protocol. Respiration, ETCO2, SpO2, BP, heart rate and rhythm monitor placed and checked for adequate function Safety Precautions: Patient was assessed for positional comfort and pressure points before starting the procedure. Time-out: I initiated and conducted the "Time-out" before starting the procedure, as per protocol. The patient was asked to participate by confirming the accuracy of the "Time Out" information. Verification of the correct person, site, and procedure were performed and confirmed by me, the nursing staff, and the patient. "Time-out" conducted as per Joint Commission's Universal Protocol (UP.01.01.01). Time: 1126  Description of Procedure:          Target Area: The interlaminar space, initially targeting the lower laminar border of the superior vertebral body. Approach: Paramedial approach. Area Prepped: Entire Posterior Lumbar Region DuraPrep (Iodine Povacrylex [0.7% available iodine] and Isopropyl Alcohol, 74%  w/w) Safety Precautions: Aspiration looking for blood return was conducted prior to all injections. At no point did we inject any substances, as a needle was being advanced. No attempts were made at seeking any paresthesias. Safe injection practices and needle disposal techniques used. Medications properly checked for expiration dates. SDV (single dose vial) medications used. Description of the Procedure: Protocol guidelines were followed. The procedure needle was introduced through the skin, ipsilateral to the reported pain, and advanced to the target area. Bone was contacted and the needle walked caudad, until the lamina was cleared. The epidural space was identified using "loss-of-resistance technique" with  2-3 ml of PF-NaCl (0.9% NSS), in a 5cc LOR glass syringe.  Vitals:   06/16/19 1130 06/16/19 1140 06/16/19 1150 06/16/19 1159  BP: (!) 143/82 128/60 136/75 (!) 147/65  Pulse:      Resp: 18 19 (!) 30 20  Temp:  (!) 97.4 F (36.3 C)  98.9 F (37.2 C)  TempSrc:  Temporal  Temporal  SpO2: 95% 99% 100% 100%  Weight:      Height:        Start Time: 1126 hrs. End Time: 1130 hrs.  Materials:  Needle(s) Type: Epidural needle Gauge: 22G Length: 3.5-in Medication(s): Please see orders for medications and dosing details. 6 cc solution made of 3 cc of preservative-free saline, 2 cc of 0.2% ropivacaine, 1 cc of Decadron 10 mg/cc.  Imaging Guidance (Spinal):          Type of Imaging Technique: Fluoroscopy Guidance (Spinal) Indication(s): Assistance in needle guidance and placement for procedures requiring needle placement in or near specific anatomical locations not easily accessible without such assistance. Exposure Time: Please see nurses notes. Contrast: Before injecting any contrast, we confirmed that the patient did not have an allergy to iodine, shellfish, or radiological contrast. Once satisfactory needle placement was completed at the desired level, radiological contrast was injected.  Contrast injected under live fluoroscopy. No contrast complications. See chart for type and volume of contrast used. Fluoroscopic Guidance: I was personally present during the use of fluoroscopy. "Tunnel Vision Technique" used to obtain the best possible view of the target area. Parallax error corrected before commencing the procedure. "Direction-depth-direction" technique used to introduce the needle under continuous pulsed fluoroscopy. Once target was reached, antero-posterior, oblique, and lateral fluoroscopic projection used confirm needle placement in all planes. Images permanently stored in EMR. Interpretation: I personally interpreted the imaging intraoperatively. Adequate needle placement confirmed in multiple planes. Appropriate spread of contrast into desired area was observed. No evidence of afferent or efferent intravascular uptake. No intrathecal or subarachnoid spread observed. Permanent images saved into the patient's record.  Antibiotic Prophylaxis:   Anti-infectives (From admission, onward)   None     Indication(s): None identified  Post-operative Assessment:  Post-procedure Vital Signs:  Pulse/HCG Rate: 7176 Temp: 98.9 F (37.2 C) Resp: 20 BP: (!) 147/65 SpO2: 100 %  EBL: None  Complications: No immediate post-treatment complications observed by team, or reported by patient.  Note: The patient tolerated the entire procedure well. A repeat set of vitals were taken after the procedure and the patient was kept under observation following institutional policy, for this type of procedure. Post-procedural neurological assessment was performed, showing return to baseline, prior to discharge. The patient was provided with post-procedure discharge instructions, including a section on how to identify potential problems. Should any problems arise concerning this procedure, the patient was given instructions to immediately contact us, at any time, without hesitation. In any case, we  plan to contact the patient by telephone for a follow-up status report regarding this interventional procedure.  Comments:  No additional relevant information.  Plan of Care  Orders:  Orders Placed This Encounter  Procedures  . [facility] PA[facility] C-ARM 1-60 MIN NO REPORT    Intraoperative interpretation by procedural physician at [facility].    Standing Status:   Standing    Number of Occurrences:   1    Order Specific Question:   Reason for exam:    Answer:   Assistance in needle guidance and placement for procedures requiring needle placement in or near  specific anatomical locations not easily accessible without such assistance.   Medications ordered for procedure: Meds ordered this encounter  Medications  . iohexol (OMNIPAQUE) 180 MG/ML injection 10 mL    Must be Myelogram-compatible. If not available, you may substitute with a water-soluble, non-ionic, hypoallergenic, myelogram-compatible radiological contrast medium.  Marland Kitchen lidocaine (XYLOCAINE) 2 % (with pres) injection 400 mg  . ropivacaine (PF) 2 mg/mL (0.2%) (NAROPIN) injection 2 mL  . sodium chloride flush (NS) 0.9 % injection 2 mL  . dexamethasone (DECADRON) injection 10 mg  . ropivacaine (PF) 2 mg/mL (0.2%) (NAROPIN) injection 4 mL  . methylPREDNISolone acetate (DEPO-MEDROL) injection 40 mg  . traMADol (ULTRAM) 50 MG tablet    Sig: Take 2 tablets (100 mg total) by mouth every 12 (twelve) hours as needed for severe pain. For chronic pain syndrome    Dispense:  120 tablet    Refill:  3  . cyclobenzaprine (FLEXERIL) 10 MG tablet    Sig: Take 1 tablet (10 mg total) by mouth 3 (three) times daily as needed for muscle spasms.    Dispense:  90 tablet    Refill:  2  . fentaNYL (SUBLIMAZE) injection 25-50 mcg    Make sure Narcan is available in the pyxis when using this medication. In the event of respiratory depression (RR< 8/min): Titrate NARCAN (naloxone) in increments of 0.1 to 0.2 mg IV at 2-3 minute intervals, until  desired degree of reversal.  . gabapentin (NEURONTIN) 400 MG capsule    Sig: Take 1 capsule (400 mg total) by mouth 2 (two) times daily. 400 mg tid    Dispense:  60 capsule    Refill:  2   [place] PMP checked and appropriate:  05/21/2019  4   02/23/2019  Tramadol Hcl 50 MG Tablet  120.00  30 Bi Lat   [id number]   [place] ([id number])   3  20.00 MME  Medicare   [place]    Medications administered: We administered iohexol, lidocaine, ropivacaine (PF) 2 mg/mL (0.2%), sodium chloride flush, dexamethasone, ropivacaine (PF) 2 mg/mL (0.2%), methylPREDNISolone acetate, and fentaNYL.  See the medical record for exact dosing, route, and time of administration.  Follow-up plan:   Return in about 4 weeks (around 07/14/2019) for Post Procedure Evaluation, virtual.      Status post bilateral intra-articular knee steroid, right elbow injection on 04/05/2019.  Status post L4-L5 ESI, right-sided along with right shoulder steroid injection on 06/16/2019     Recent Visits Date Type Provider Dept  06/07/19 Telemedicine Gillis Santa, MD [facility]  04/05/19 Procedure visit Gillis Santa, MD [facility]  03/29/19 Office Visit Gillis Santa, MD [facility]  Showing recent visits within past 90 days and meeting all other requirements   Today's Visits Date Type Provider Dept  06/16/19 Procedure visit Gillis Santa, MD [facility]  Showing today's visits and meeting all other requirements   Future Appointments Date Type Provider Dept  06/22/19 Appointment Gillis Santa, MD [facility]  Showing future appointments within next 90 days and meeting all other requirements   Disposition: Discharge home  Discharge (Date  Time): 06/16/2019; 1201 hrs.   Primary Care Physician: Baxter Hire, MD Location: [facility] Note by: Gillis Santa, MD Date: 06/16/2019; Time: 12:55 PM  Disclaimer:  Medicine is not an exact science. The only guarantee in medicine  is that nothing is guaranteed. It is important to note that the decision to proceed with this intervention was based on  the information collected from the patient. The Data and conclusions were drawn from the patient's questionnaire, the interview, and the physical examination. Because the information was provided in large part by the patient, it cannot be guaranteed that it has not been purposely or unconsciously manipulated. Every effort has been made to obtain as much relevant data as possible for this evaluation. It is important to note that the conclusions that lead to this procedure are derived in large part from the available data. Always take into account that the treatment will also be dependent on availability of resources and existing treatment guidelines, considered by other Pain Management Practitioners as being common knowledge and practice, at the time of the intervention. For Medico-Legal purposes, it is also important to point out that variation in procedural techniques and pharmacological choices are the acceptable norm. The indications, contraindications, technique, and results of the above procedure should only be interpreted and judged by a Board-Certified Interventional Pain Specialist with extensive familiarity and expertise in the same exact procedure and technique.

## 2019-06-17 ENCOUNTER — Telehealth: Payer: Self-pay

## 2019-06-17 NOTE — Telephone Encounter (Signed)
Post procedure phone call.  Patient states she is doing good.,

## 2019-06-22 ENCOUNTER — Telehealth: Payer: PPO | Admitting: Student in an Organized Health Care Education/Training Program

## 2019-06-22 ENCOUNTER — Telehealth: Payer: Self-pay

## 2019-06-22 DIAGNOSIS — R42 Dizziness and giddiness: Secondary | ICD-10-CM | POA: Diagnosis not present

## 2019-06-22 DIAGNOSIS — H3552 Pigmentary retinal dystrophy: Secondary | ICD-10-CM | POA: Diagnosis not present

## 2019-06-22 DIAGNOSIS — H548 Legal blindness, as defined in USA: Secondary | ICD-10-CM | POA: Diagnosis not present

## 2019-06-22 DIAGNOSIS — E538 Deficiency of other specified B group vitamins: Secondary | ICD-10-CM | POA: Diagnosis not present

## 2019-06-22 DIAGNOSIS — H5316 Psychophysical visual disturbances: Secondary | ICD-10-CM | POA: Diagnosis not present

## 2019-06-22 DIAGNOSIS — E559 Vitamin D deficiency, unspecified: Secondary | ICD-10-CM | POA: Diagnosis not present

## 2019-06-22 NOTE — Telephone Encounter (Signed)
Patient informed that it may be expected to experience leg weakness after ESI in lower back. Instructed patient to document this on her post procedure diary and discuss with Dr. Dossie Arbour at follow-up appointment.

## 2019-06-22 NOTE — Telephone Encounter (Signed)
She said her legs are weak since her shot and wants to know if that's normal? Please call her to discuss

## 2019-07-13 DIAGNOSIS — B359 Dermatophytosis, unspecified: Secondary | ICD-10-CM | POA: Diagnosis not present

## 2019-07-13 DIAGNOSIS — M7989 Other specified soft tissue disorders: Secondary | ICD-10-CM | POA: Diagnosis not present

## 2019-07-13 DIAGNOSIS — J40 Bronchitis, not specified as acute or chronic: Secondary | ICD-10-CM | POA: Diagnosis not present

## 2019-07-14 ENCOUNTER — Encounter: Payer: Self-pay | Admitting: Student in an Organized Health Care Education/Training Program

## 2019-07-15 ENCOUNTER — Other Ambulatory Visit: Payer: Self-pay

## 2019-07-15 ENCOUNTER — Encounter: Payer: Self-pay | Admitting: Student in an Organized Health Care Education/Training Program

## 2019-07-15 ENCOUNTER — Ambulatory Visit
Payer: PPO | Attending: Student in an Organized Health Care Education/Training Program | Admitting: Student in an Organized Health Care Education/Training Program

## 2019-07-15 VITALS — Wt 138.0 lb

## 2019-07-15 DIAGNOSIS — G894 Chronic pain syndrome: Secondary | ICD-10-CM | POA: Diagnosis not present

## 2019-07-15 DIAGNOSIS — M19011 Primary osteoarthritis, right shoulder: Secondary | ICD-10-CM | POA: Diagnosis not present

## 2019-07-15 DIAGNOSIS — M19021 Primary osteoarthritis, right elbow: Secondary | ICD-10-CM

## 2019-07-15 NOTE — Progress Notes (Signed)
Patient: Kassady Laboy  Service Category: E/M  Provider: Gillis Santa, MD  DOB: 1955/03/31  DOS: 07/15/2019  Location: Office  MRN: [id number]  Setting: Ambulatory outpatient  Referring Provider: Baxter Hire, MD  Type: Established Patient  Specialty: Interventional Pain Management  PCP: Baxter Hire, MD  Location: Home  Delivery: TeleHealth     Virtual Encounter - Pain Management PROVIDER NOTE: Information contained herein reflects review and annotations entered in association with encounter. Interpretation of such information and data should be left to medically-trained personnel. Information provided to patient can be located elsewhere in the medical record under "Patient Instructions". Document created using STT-dictation technology, any transcriptional errors that may result from process are unintentional.    Contact & Pharmacy Preferred: [phone] Home: [phone] (home) Mobile: [phone] (mobile) E-mail: No e-mail address on record  [place] [place] [address] [place] [address] Phone: [phone] Fax: [phone]   Pre-screening  Ms. Bahri offered "in-person" vs "virtual" encounter. She indicated preferring virtual for this encounter.   Reason COVID-19*  Social distancing based on CDC and AMA recommendations.   I contacted Shanyiah Conde on 07/15/2019 via telephone.      I clearly identified myself as Gillis Santa, MD. I verified that I was speaking with the correct person using two identifiers (Name: Anabeth Chilcott, and date of birth: 1955/03/24).  Consent I sought verbal advanced consent from [name] for virtual visit interactions. I informed Ms. Guerrini of possible security and privacy concerns, risks, and limitations associated with providing "not-in-person" medical evaluation and management services. I also informed Ms. Rochel of the availability of "in-person" appointments. Finally, I informed her that  there would be a charge for the virtual visit and that she could be  personally, fully or partially, financially responsible for it. Ms. Vanosdol expressed understanding and agreed to proceed.   Historic Elements   Ms. Maiyah Goyne is a 64 y.o. year old, female patient evaluated today after her last contact with our practice on 06/22/2019. Ms. Sytsma  has a past medical history of Anemia, Anxiety (08/01/2014), Arthritis, degenerative (07/30/2013), Clinical depression (06/30/2014), Depression, Elevated liver enzymes, GERD (gastroesophageal reflux disease), H/O breast biopsy (01/05/2015), H/O: attempted suicide (2013), Hepatitis B, Hiatal hernia, History of hysterectomy (01/05/2015), History of peptic ulcer disease (01/05/2015), Hypercholesteremia, Hypertension, Legally blind, Panic attacks, PUD (peptic ulcer disease), Rectocele (01/05/2015), Retinitis pigmentosa, and S/P cholecystectomy (01/05/2015). She also  has a past surgical history that includes Cholecystectomy; Rectocele repair; Abdominal hysterectomy; Appendectomy; Colonoscopy; Esophagogastroduodenoscopy; Sphincterotomy (N/A, 01/24/2017); Hemorrhoid surgery (N/A, 03/07/2017); Hernia repair; Esophagogastroduodenoscopy (egd) with propofol (N/A, 09/17/2017); Oophorectomy; and Breast biopsy (Bilateral, 1977). Ms. Stead has a current medication list which includes the following prescription(s): acetaminophen, albuterol, alprazolam, amitriptyline, amlodipine, amlodipine-benazepril, aspirin, azithromycin, buspirone, buspirone, cyclobenzaprine, escitalopram, esomeprazole, eszopiclone, famotidine, fluticasone, gabapentin, hydrochlorothiazide, lansoprazole, levocetirizine, lidocaine, meclizine, melatonin, montelukast, ondansetron, promethazine, simvastatin, temazepam, terbinafine, tramadol, vitamin b-12, benzonatate, and prednisone. She  reports that she quit smoking about 45 years ago. She has never used smokeless tobacco. She reports that she does not drink  alcohol or use drugs. Ms. Helvie is allergic to trazodone; acetaminophen-codeine; codeine; oxycontin [oxycodone hcl]; trazodone and nefazodone; vicodin [hydrocodone-acetaminophen]; zolpidem; hydrocodone-acetaminophen; and oxycodone-acetaminophen.   HPI  Today, she is being contacted for a post-procedure assessment.  Evaluation of last interventional procedure  06/16/2019 Procedure: Type: Diagnostic Glenohumeral Joint (shoulder) Injection #1  Primary Purpose: Diagnostic Region: Posterior Shoulder Area Level:  Shoulder Target Area: Glenohumeral Joint (shoulder) Approach:  Posterior approach. Laterality: Right  Sedation: Please see nurses note for DOS. When no sedatives are used, the analgesic levels obtained are directly associated to the effectiveness of the local anesthetics. However, when sedation is provided, the level of analgesia obtained during the initial 1 hour following the intervention, is believed to be the result of a combination of factors. These factors may include, but are not limited to: 1. The effectiveness of the local anesthetics used. 2. The effects of the analgesic(s) and/or anxiolytic(s) used. 3. The degree of discomfort experienced by the patient at the time of the procedure. 4. The patients ability and reliability in recalling and recording the events. 5. The presence and influence of possible secondary gains and/or psychosocial factors. Reported result: Relief experienced during the 1st hour after the procedure: 100 % (Ultra-Short Term Relief)            Interpretative annotation: Clinically appropriate result. Analgesia during this period is likely to be Local Anesthetic and/or IV Sedative (Analgesic/Anxiolytic) related.          Effects of local anesthetic: The analgesic effects attained during this period are directly associated to the localized infiltration of local anesthetics and therefore cary significant diagnostic value as to the etiological location, or  anatomical origin, of the pain. Expected duration of relief is directly dependent on the pharmacodynamics of the local anesthetic used. Long-acting (4-6 hours) anesthetics used.  Reported result: Relief during the next 4 to 6 hour after the procedure: 100 % (Short-Term Relief)            Interpretative annotation: Clinically appropriate result. Analgesia during this period is likely to be Local Anesthetic-related.          Long-term benefit: Defined as the period of time past the expected duration of local anesthetics (1 hour for short-acting and 4-6 hours for long-acting). With the possible exception of prolonged sympathetic blockade from the local anesthetics, benefits during this period are typically attributed to, or associated with, other factors such as analgesic sensory neuropraxia, antiinflammatory effects, or beneficial biochemical changes provided by agents other than the local anesthetics.  Reported result: Extended relief following procedure: 50%  (Long-Term Relief)            Interpretative annotation: Clinically possible results. No long-term benefit.                UDS:  Summary  Date Value Ref Range Status  11/24/2018 Note  Final    Comment:    ==================================================================== [employer] Select 13 (MW) ==================================================================== Specimen Alert Note: Urinary creatinine is low; ability to detect some drugs may be compromised. Interpret results with caution. (Creatinine) ==================================================================== Test                             Result       Flag       Units Drug Present and Declared for Prescription Verification   Oxazepam                       627          EXPECTED   ng/mg creat   Temazepam                      5820         EXPECTED   ng/mg creat    Oxazepam and temazepam are expected metabolites of diazepam.    Oxazepam is also  an expected metabolite of  other benzodiazepine    drugs, including chlordiazepoxide, prazepam, clorazepate, halazepam,    and temazepam.  Oxazepam and temazepam are available as scheduled    prescription medications.   Tramadol                       >33333       EXPECTED   ng/mg creat   O-Desmethyltramadol            >33333       EXPECTED   ng/mg creat   N-Desmethyltramadol            16893        EXPECTED   ng/mg creat    Source of tramadol is a prescription medication. O-desmethyltramadol    and N-desmethyltramadol are expected metabolites of tramadol. Drug Absent but Declared for Prescription Verification   Alprazolam                     Not Detected UNEXPECTED ng/mg creat ==================================================================== Test                      Result    Flag   Units      Ref Range   Creatinine              15        LL     mg/dL      >=20 ==================================================================== Declared Medications:  The flagging and interpretation on this report are based on the  following declared medications.  Unexpected results may arise from  inaccuracies in the declared medications.  **Note: The testing scope of this panel includes these medications:  Alprazolam  Temazepam  Tramadol  **Note: The testing scope of this panel does not include the  following reported medications:  Acetaminophen  Albuterol  Amlodipine  Aspirin  Benazepril  Benzonatate  Buspirone  Cyanocobalamin  Cyclobenzaprine  Escitalopram (Lexapro)  Eszopiclone  Fluticasone  Gabapentin  Hydrochlorothiazide  Lansoprazole  Levocetirizine  Lidocaine  Meclizine  Melatonin  Montelukast  Ondansetron  Simvastatin ==================================================================== For clinical consultation, please call [phone]. ====================================================================    Laboratory Chemistry Profile   Renal Lab Results  Component Value Date   BUN 18  01/16/2017   CREATININE 0.75 01/16/2017   GFRAA >60 01/16/2017   GFRNONAA >60 01/16/2017     Hepatic Lab Results  Component Value Date   AST 24 05/13/2015   ALT 13 (L) 05/13/2015   ALBUMIN 4.5 05/13/2015   ALKPHOS 75 05/13/2015     Electrolytes Lab Results  Component Value Date   NA 138 01/16/2017   K 4.2 01/16/2017   CL 101 01/16/2017   CALCIUM 9.4 01/16/2017   MG 2.0 02/07/2015     Bone Lab Results  Component Value Date   25OHVITD1 19 (L) 02/07/2015   25OHVITD2 3.9 02/07/2015   25OHVITD3 15 02/07/2015     Inflammation (CRP: Acute Phase) (ESR: Chronic Phase) Lab Results  Component Value Date   CRP <0.5 02/07/2015   ESRSEDRATE 26 02/07/2015       Note: Above Lab results reviewed.   Assessment  The primary encounter diagnosis was Localized osteoarthritis of right shoulder. Diagnoses of Arthropathy of right elbow and Chronic pain syndrome were also pertinent to this visit.  Plan of Care   Ms. Dianah Pruett has a current medication list which includes the following long-term medication(s): albuterol, amitriptyline, cyclobenzaprine, escitalopram, esomeprazole, eszopiclone, famotidine, fluticasone, gabapentin,  hydrochlorothiazide, levocetirizine, montelukast, promethazine, and simvastatin.  Pharmacotherapy (Medications Ordered): No orders of the defined types were placed in this encounter.  Orders:  Orders Placed This Encounter  Procedures  . SHOULDER INJECTION    Standing Status:   Future    Standing Expiration Date:   08/14/2019    Scheduling Instructions:     Side: R     Sedation: With Sedation.     Timeframe: As soon as schedule allows    Order Specific Question:   Where will this procedure be performed?    Answer:   [facility]    Comments:   Avanni Turnbaugh   Follow-up plan:   Return in about 2 weeks (around 07/29/2019) for R shoulder inje, with sedation.     Status post bilateral intra-articular knee steroid, right elbow injection on 04/05/2019.   Status post L4-L5 ESI, right-sided along with right shoulder steroid injection on 06/16/2019, repeat      Recent Visits Date Type Provider Dept  06/16/19 Procedure visit Gillis Santa, MD [facility]  06/07/19 Telemedicine Gillis Santa, MD [facility]  Showing recent visits within past 90 days and meeting all other requirements   Today's Visits Date Type Provider Dept  07/15/19 Telemedicine Gillis Santa, MD [facility]  Showing today's visits and meeting all other requirements   Future Appointments Date Type Provider Dept  09/21/19 Appointment Gillis Santa, MD [facility]  Showing future appointments within next 90 days and meeting all other requirements   I discussed the assessment and treatment plan with the patient. The patient was provided an opportunity to ask questions and all were answered. The patient agreed with the plan and demonstrated an understanding of the instructions.  Patient advised to call back or seek an in-person evaluation if the symptoms or condition worsens.  Duration of encounter: 30 minutes.  Note by: Gillis Santa, MD Date: 07/15/2019; Time: 10:30 AM

## 2019-07-28 ENCOUNTER — Encounter: Payer: Self-pay | Admitting: Student in an Organized Health Care Education/Training Program

## 2019-07-28 ENCOUNTER — Ambulatory Visit
Admission: RE | Admit: 2019-07-28 | Discharge: 2019-07-28 | Disposition: A | Discharge status: Home / Self Care | Payer: PPO | Source: Ambulatory Visit | Attending: Student in an Organized Health Care Education/Training Program | Admitting: Student in an Organized Health Care Education/Training Program

## 2019-07-28 ENCOUNTER — Ambulatory Visit (HOSPITAL_BASED_OUTPATIENT_CLINIC_OR_DEPARTMENT_OTHER): Payer: PPO | Admitting: Student in an Organized Health Care Education/Training Program

## 2019-07-28 ENCOUNTER — Other Ambulatory Visit: Payer: Self-pay

## 2019-07-28 VITALS — BP 143/78 | HR 88 | Temp 97.8°F | Resp 16 | Ht 61.0 in | Wt 134.0 lb

## 2019-07-28 DIAGNOSIS — G894 Chronic pain syndrome: Secondary | ICD-10-CM

## 2019-07-28 DIAGNOSIS — M19011 Primary osteoarthritis, right shoulder: Secondary | ICD-10-CM | POA: Diagnosis not present

## 2019-07-28 MED ORDER — ROPIVACAINE HCL 2 MG/ML IJ SOLN
INTRAMUSCULAR | Status: AC
  Filled 2019-07-28: qty 10

## 2019-07-28 MED ORDER — IOHEXOL 180 MG/ML  SOLN
10.0000 mL | Freq: Once | INTRAMUSCULAR | Status: AC
  Administered 2019-07-28: 10 mL via INTRATHECAL

## 2019-07-28 MED ORDER — METHYLPREDNISOLONE ACETATE 40 MG/ML IJ SUSP
INTRAMUSCULAR | Status: AC
  Filled 2019-07-28: qty 1

## 2019-07-28 MED ORDER — LIDOCAINE HCL 2 % IJ SOLN
20.0000 mL | Freq: Once | INTRAMUSCULAR | Status: AC
  Administered 2019-07-28: 400 mg

## 2019-07-28 MED ORDER — FENTANYL CITRATE (PF) 100 MCG/2ML IJ SOLN
25.0000 ug | INTRAMUSCULAR | Status: DC | PRN
  Administered 2019-07-28: 25 ug via INTRAVENOUS

## 2019-07-28 MED ORDER — FENTANYL CITRATE (PF) 100 MCG/2ML IJ SOLN
INTRAMUSCULAR | Status: AC
  Filled 2019-07-28: qty 2

## 2019-07-28 MED ORDER — ROPIVACAINE HCL 2 MG/ML IJ SOLN
4.0000 mL | Freq: Once | INTRAMUSCULAR | Status: AC
  Administered 2019-07-28: 4 mL via INTRA_ARTICULAR

## 2019-07-28 MED ORDER — LIDOCAINE HCL 2 % IJ SOLN
INTRAMUSCULAR | Status: AC
  Filled 2019-07-28: qty 20

## 2019-07-28 MED ORDER — METHYLPREDNISOLONE ACETATE 40 MG/ML IJ SUSP
40.0000 mg | Freq: Once | INTRAMUSCULAR | Status: AC
  Administered 2019-07-28: 40 mg via INTRA_ARTICULAR

## 2019-07-28 NOTE — Progress Notes (Signed)
PROVIDER NOTE: Information contained herein reflects review and annotations entered in association with encounter. Interpretation of such information and data should be left to medically-trained personnel. Information provided to patient can be located elsewhere in the medical record under "Patient Instructions". Document created using STT-dictation technology, any transcriptional errors that may result from process are unintentional.    Patient: Sandra [name]  Service Category: Procedure  Provider: Gillis Santa, MD  DOB: 01/12/1956  DOS: 07/28/2019  Location: [facility]  MRN: [id number]  Setting: Ambulatory - outpatient  Referring Provider: Baxter Hire, MD  Type: Established Patient  Specialty: Interventional Pain Management  PCP: Baxter Hire, MD   Primary Reason for Visit: Interventional Pain Management Treatment. CC: Shoulder Pain  Procedure:          Anesthesia, Analgesia, Anxiolysis:  Type: Therapeutic Glenohumeral Joint (shoulder) Injection #2  Primary Purpose: Therapeutic Region: Posterior Shoulder Area Level:  Shoulder Target Area: Glenohumeral Joint (shoulder) Approach: Posterior approach. Laterality: Right-Sided  Type: Moderate (Conscious) Sedation combined with Local Anesthesia Indication(s): Analgesia and Anxiety Route: Intravenous (IV) IV Access: Secured Sedation: Meaningful verbal contact was maintained at all times during the procedure  Local Anesthetic: Lidocaine 1-2%  Position: Supine   Indications: 1. Localized osteoarthritis of right shoulder   2. Chronic pain syndrome    Pain Score: Pre-procedure: 5 /10 Post-procedure: 2 /10   Pre-op Assessment:  Sandra [name] is a 64 y.o. (year old), female patient, seen today for interventional treatment. She  has a past surgical history that includes Cholecystectomy; Rectocele repair; Abdominal hysterectomy; Appendectomy; Colonoscopy; Esophagogastroduodenoscopy; Sphincterotomy (N/A, 01/24/2017);  Hemorrhoid surgery (N/A, 03/07/2017); Hernia repair; Esophagogastroduodenoscopy (egd) with propofol (N/A, 09/17/2017); Oophorectomy; and Breast biopsy (Bilateral, 1977). Sandra [name] has a current medication list which includes the following prescription(s): acetaminophen, albuterol, alprazolam, amitriptyline, amlodipine, amlodipine-benazepril, aspirin, buspirone, buspirone, cyclobenzaprine, escitalopram, esomeprazole, eszopiclone, famotidine, fluticasone, gabapentin, hydrochlorothiazide, lansoprazole, levocetirizine, lidocaine, meclizine, melatonin, montelukast, ondansetron, promethazine, simvastatin, temazepam, terbinafine, tramadol, vitamin b-12, azithromycin, benzonatate, and prednisone, and the following Facility-Administered Medications: fentanyl. Her primarily concern today is the Shoulder Pain  Initial Vital Signs:  Pulse/HCG Rate: 92ECG Heart Rate: 79 Temp: (!) 97.3 F (36.3 C) Resp: 18 BP: 130/86 SpO2: 97 %  BMI: Estimated body mass index is 25.32 kg/m as calculated from the following:   Height as of this encounter: 5\' 1"  (1.549 m).   Weight as of this encounter: 134 lb (60.8 kg).  Risk Assessment: Allergies: Reviewed. She is allergic to trazodone; acetaminophen-codeine; codeine; oxycontin [oxycodone hcl]; trazodone and nefazodone; vicodin [hydrocodone-acetaminophen]; zolpidem; hydrocodone-acetaminophen; and oxycodone-acetaminophen.  Allergy Precautions: None required Coagulopathies: Reviewed. None identified.  Blood-thinner therapy: None at this time Active Infection(s): Reviewed. None identified. Sandra [name] is afebrile  Site Confirmation: Sandra [name] was asked to confirm the procedure and laterality before marking the site Procedure checklist: Completed Consent: Before the procedure and under the influence of no sedative(s), amnesic(s), or anxiolytics, the patient was informed of the treatment options, risks and possible complications. To fulfill our ethical and legal  obligations, as recommended by the American Medical Association's Code of Ethics, I have informed the patient of my clinical impression; the nature and purpose of the treatment or procedure; the risks, benefits, and possible complications of the intervention; the alternatives, including doing nothing; the risk(s) and benefit(s) of the alternative treatment(s) or procedure(s); and the risk(s) and benefit(s) of doing nothing. The patient was provided information about the general risks and possible complications associated with the procedure. These may include, but are not limited  to: failure to achieve desired goals, infection, bleeding, organ or nerve damage, allergic reactions, paralysis, and death. In addition, the patient was informed of those risks and complications associated to the procedure, such as failure to decrease pain; infection; bleeding; organ or nerve damage with subsequent damage to sensory, motor, and/or autonomic systems, resulting in permanent pain, numbness, and/or weakness of one or several areas of the body; allergic reactions; (i.e.: anaphylactic reaction); and/or death. Furthermore, the patient was informed of those risks and complications associated with the medications. These include, but are not limited to: allergic reactions (i.e.: anaphylactic or anaphylactoid reaction(s)); adrenal axis suppression; blood sugar elevation that in diabetics may result in ketoacidosis or comma; water retention that in patients with history of congestive heart failure may result in shortness of breath, pulmonary edema, and decompensation with resultant heart failure; weight gain; swelling or edema; medication-induced neural toxicity; particulate matter embolism and blood vessel occlusion with resultant organ, and/or nervous system infarction; and/or aseptic necrosis of one or more joints. Finally, the patient was informed that Medicine is not an exact science; therefore, there is also the possibility of  unforeseen or unpredictable risks and/or possible complications that may result in a catastrophic outcome. The patient indicated having understood very clearly. We have given the patient no guarantees and we have made no promises. Enough time was given to the patient to ask questions, all of which were answered to the patient's satisfaction. Ms. Fowers has indicated that she wanted to continue with the procedure. Attestation: I, the ordering provider, attest that I have discussed with the patient the benefits, risks, side-effects, alternatives, likelihood of achieving goals, and potential problems during recovery for the procedure that I have provided informed consent. Date  Time: 07/28/2019 10:27 AM  Pre-Procedure Preparation:  Monitoring: As per clinic protocol. Respiration, ETCO2, SpO2, BP, heart rate and rhythm monitor placed and checked for adequate function Safety Precautions: Patient was assessed for positional comfort and pressure points before starting the procedure. Time-out: I initiated and conducted the "Time-out" before starting the procedure, as per protocol. The patient was asked to participate by confirming the accuracy of the "Time Out" information. Verification of the correct person, site, and procedure were performed and confirmed by me, the nursing staff, and the patient. "Time-out" conducted as per Joint Commission's Universal Protocol (UP.01.01.01). Time: 1116  Description of Procedure:          Area Prepped: Entire shoulder Area DuraPrep (Iodine Povacrylex [0.7% available iodine] and Isopropyl Alcohol, 74% w/w) Safety Precautions: Aspiration looking for blood return was conducted prior to all injections. At no point did we inject any substances, as a needle was being advanced. No attempts were made at seeking any paresthesias. Safe injection practices and needle disposal techniques used. Medications properly checked for expiration dates. SDV (single dose vial) medications  used. Description of the Procedure: Protocol guidelines were followed. The patient was placed in position over the procedure table. The target area was identified and the area prepped in the usual manner. Skin & deeper tissues infiltrated with local anesthetic. Appropriate amount of time allowed to pass for local anesthetics to take effect. The procedure needles were then advanced to the target area. Proper needle placement secured. Negative aspiration confirmed. Solution injected in intermittent fashion, asking for systemic symptoms every 0.5cc of injectate. The needles were then removed and the area cleansed, making sure to leave some of the prepping solution back to take advantage of its long term bactericidal properties.  Vitals:   07/28/19 1120 07/28/19 1128 07/28/19 1138 07/28/19 1149  BP: 131/88 (!) 151/76 (!) 151/79 (!) 143/78  Pulse: 88     Resp: 15 13 16 16   Temp:  97.7 F (36.5 C)  97.8 F (36.6 C)  SpO2: 97% 99% 100% 99%  Weight:      Height:        Start Time: 1116 hrs. End Time:   hrs. Materials:  Needle(s) Type: Spinal Needle Gauge: 22G Length: 3.5-in Medication(s): Please see orders for medications and dosing details. 5 cc solution made of 4 cc of 0.2% ropivacaine, 1 cc of methylprednisolone, 40 mg/cc. Imaging Guidance (Non-Spinal):          Type of Imaging Technique: Fluoroscopy Guidance (Non-Spinal) Indication(s): Assistance in needle guidance and placement for procedures requiring needle placement in or near specific anatomical locations not easily accessible without such assistance. Exposure Time: Please see nurses notes. Contrast: Before injecting any contrast, we confirmed that the patient did not have an allergy to iodine, shellfish, or radiological contrast. Once satisfactory needle placement was completed at the desired level, radiological contrast was injected. Contrast injected under live fluoroscopy. No contrast complications. See chart for type and  volume of contrast used. Fluoroscopic Guidance: I was personally present during the use of fluoroscopy. "Tunnel Vision Technique" used to obtain the best possible view of the target area. Parallax error corrected before commencing the procedure. "Direction-depth-direction" technique used to introduce the needle under continuous pulsed fluoroscopy. Once target was reached, antero-posterior, oblique, and lateral fluoroscopic projection used confirm needle placement in all planes. Images permanently stored in EMR. Interpretation: I personally interpreted the imaging intraoperatively. Adequate needle placement confirmed in multiple planes. Appropriate spread of contrast into desired area was observed. No evidence of afferent or efferent intravascular uptake. Permanent images saved into the patient's record.  Antibiotic Prophylaxis:   Anti-infectives (From admission, onward)   None     Indication(s): None identified  Post-operative Assessment:  Post-procedure Vital Signs:  Pulse/HCG Rate: 8884 Temp: 97.8 F (36.6 C) Resp: 16 BP: (!) 143/78 SpO2: 99 %  EBL: None  Complications: No immediate post-treatment complications observed by team, or reported by patient.  Note: The patient tolerated the entire procedure well. A repeat set of vitals were taken after the procedure and the patient was kept under observation following institutional policy, for this type of procedure. Post-procedural neurological assessment was performed, showing return to baseline, prior to discharge. The patient was provided with post-procedure discharge instructions, including a section on how to identify potential problems. Should any problems arise concerning this procedure, the patient was given instructions to immediately contact us, at any time, without hesitation. In any case, we plan to contact the patient by telephone for a follow-up status report regarding this interventional procedure.  Comments:  No additional  relevant information.  Plan of Care  Orders:  Orders Placed This Encounter  Procedures  . DG PAIN [facility] C-ARM 1-60 MIN NO REPORT    Intraoperative interpretation by procedural physician at [place].    Standing Status:   Standing    Number of Occurrences:   1    Order Specific Question:   Reason for exam:    Answer:   Assistance in needle guidance and placement for procedures requiring needle placement in or near specific anatomical locations not easily accessible without such assistance.   Medications ordered for procedure: Meds ordered this encounter  Medications  . iohexol (OMNIPAQUE) 180 MG/ML injection 10 mL  Must be Myelogram-compatible. If not available, you may substitute with a water-soluble, non-ionic, hypoallergenic, myelogram-compatible radiological contrast medium.  Marland Kitchen lidocaine (XYLOCAINE) 2 % (with pres) injection 400 mg  . ropivacaine (PF) 2 mg/mL (0.2%) (NAROPIN) injection 4 mL  . methylPREDNISolone acetate (DEPO-MEDROL) injection 40 mg  . fentaNYL (SUBLIMAZE) injection 25-50 mcg    Make sure Narcan is available in the pyxis when using this medication. In the event of respiratory depression (RR< 8/min): Titrate NARCAN (naloxone) in increments of 0.1 to 0.2 mg IV at 2-3 minute intervals, until desired degree of reversal.   Medications administered: We administered iohexol, lidocaine, ropivacaine (PF) 2 mg/mL (0.2%), methylPREDNISolone acetate, and fentaNYL.  See the medical record for exact dosing, route, and time of administration.  Follow-up plan:   Return in about 5 weeks (around 09/01/2019) for Post Procedure Evaluation, virtual.      Status post bilateral intra-articular knee steroid, right elbow injection on 04/05/2019.  Status post L4-L5 ESI, right-sided along with right shoulder steroid injection on 06/16/2019, right shoulder injection on 07/28/2019, posterior approach      Recent Visits Date Type Provider Dept  07/15/19 Telemedicine Gillis Santa,  MD [facility]  06/16/19 Procedure visit Gillis Santa, MD [facility]  06/07/19 Telemedicine Gillis Santa, MD [facility]  Showing recent visits within past 90 days and meeting all other requirements   Today's Visits Date Type Provider Dept  07/28/19 Procedure visit Gillis Santa, MD [facility]  Showing today's visits and meeting all other requirements   Future Appointments Date Type Provider Dept  09/06/19 Appointment Gillis Santa, MD [facility]  09/21/19 Appointment Gillis Santa, MD [facility]  Showing future appointments within next 90 days and meeting all other requirements   Disposition: Discharge home  Discharge (Date  Time): 07/28/2019; 1150 hrs.   Primary Care Physician: Baxter Hire, MD Location: [facility] Note by: Gillis Santa, MD Date: 07/28/2019; Time: 1:14 PM  Disclaimer:  Medicine is not an exact science. The only guarantee in medicine is that nothing is guaranteed. It is important to note that the decision to proceed with this intervention was based on the information collected from the patient. The Data and conclusions were drawn from the patient's questionnaire, the interview, and the physical examination. Because the information was provided in large part by the patient, it cannot be guaranteed that it has not been purposely or unconsciously manipulated. Every effort has been made to obtain as much relevant data as possible for this evaluation. It is important to note that the conclusions that lead to this procedure are derived in large part from the available data. Always take into account that the treatment will also be dependent on availability of resources and existing treatment guidelines, considered by other Pain Management Practitioners as being common knowledge and practice, at the time of the intervention. For Medico-Legal purposes, it is also important to point out  that variation in procedural techniques and pharmacological choices are the acceptable norm. The indications, contraindications, technique, and results of the above procedure should only be interpreted and judged by a Board-Certified Interventional Pain Specialist with extensive familiarity and expertise in the same exact procedure and technique.

## 2019-07-28 NOTE — Progress Notes (Signed)
Safety precautions to be maintained throughout the outpatient stay will include: orient to surroundings, keep bed in low position, maintain call bell within reach at all times, provide assistance with transfer out of bed and ambulation.  

## 2019-08-04 ENCOUNTER — Telehealth: Payer: Self-pay | Admitting: Student in an Organized Health Care Education/Training Program

## 2019-08-04 NOTE — Telephone Encounter (Signed)
Advised to go to Urgent Care or see PCP.

## 2019-08-04 NOTE — Telephone Encounter (Signed)
Patient called stating she had a fall on Saturday and has injured her left side in rib area. Her current medications are not helping and wanted to know if Dr Holley Raring would call in some kind of anti-inflamatory med. Please call patient

## 2019-08-05 DIAGNOSIS — R0789 Other chest pain: Secondary | ICD-10-CM | POA: Diagnosis not present

## 2019-08-09 ENCOUNTER — Telehealth: Payer: Self-pay | Admitting: *Deleted

## 2019-08-09 NOTE — Telephone Encounter (Signed)
After fall, patient went to Dr Wynetta Emery, [place] Brunette were done and she has fx's in the left intercostals.  He prescribed Naproxyn 500 mg bid for the pain.  She just wanted to let us know. She is also using her pain medication. All of these in combination are helping the pain.

## 2019-09-02 ENCOUNTER — Encounter: Payer: Self-pay | Admitting: Student in an Organized Health Care Education/Training Program

## 2019-09-06 ENCOUNTER — Other Ambulatory Visit: Payer: Self-pay

## 2019-09-06 ENCOUNTER — Encounter: Payer: Self-pay | Admitting: Student in an Organized Health Care Education/Training Program

## 2019-09-06 ENCOUNTER — Ambulatory Visit
Payer: PPO | Attending: Student in an Organized Health Care Education/Training Program | Admitting: Student in an Organized Health Care Education/Training Program

## 2019-09-06 DIAGNOSIS — M79605 Pain in left leg: Secondary | ICD-10-CM | POA: Diagnosis not present

## 2019-09-06 DIAGNOSIS — M19011 Primary osteoarthritis, right shoulder: Secondary | ICD-10-CM

## 2019-09-06 DIAGNOSIS — G894 Chronic pain syndrome: Secondary | ICD-10-CM

## 2019-09-06 DIAGNOSIS — M5416 Radiculopathy, lumbar region: Secondary | ICD-10-CM

## 2019-09-06 DIAGNOSIS — M19021 Primary osteoarthritis, right elbow: Secondary | ICD-10-CM | POA: Diagnosis not present

## 2019-09-06 DIAGNOSIS — G8929 Other chronic pain: Secondary | ICD-10-CM

## 2019-09-06 DIAGNOSIS — M79604 Pain in right leg: Secondary | ICD-10-CM | POA: Diagnosis not present

## 2019-09-06 DIAGNOSIS — M47816 Spondylosis without myelopathy or radiculopathy, lumbar region: Secondary | ICD-10-CM

## 2019-09-06 DIAGNOSIS — M170 Bilateral primary osteoarthritis of knee: Secondary | ICD-10-CM

## 2019-09-06 MED ORDER — TRAMADOL HCL 50 MG PO TABS: 100.0000 mg | ORAL_TABLET | Freq: Two times a day (BID) | ORAL | 2 refills | Status: DC | PRN

## 2019-09-06 NOTE — Progress Notes (Signed)
Patient: Sandra [name]  Service Category: E/M  Provider: Gillis Santa, MD  DOB: [birth year]  DOS: 09/06/2019  Location: Office  MRN: [id number]  Setting: Ambulatory outpatient  Referring Provider: Baxter Hire, MD  Type: Established Patient  Specialty: Interventional Pain Management  PCP: Baxter Hire, MD  Location: Home  Delivery: TeleHealth     Virtual Encounter - Pain Management PROVIDER NOTE: Information contained herein reflects review and annotations entered in association with encounter. Interpretation of such information and data should be left to medically-trained personnel. Information provided to patient can be located elsewhere in the medical record under "Patient Instructions". Document created using STT-dictation technology, any transcriptional errors that may result from process are unintentional.    Contact & Pharmacy Preferred: [phone] Home: [phone] (home) Mobile: [phone] (mobile) E-mail: No e-mail address on record  [place], [place] [address] [place] [address] Phone: [phone] Fax: [phone]   Pre-screening  Sandra [name] offered "in-person" vs "virtual" encounter. She indicated preferring virtual for this encounter.   Reason COVID-19*  Social distancing based on CDC and AMA recommendations.   I contacted Zyrah Wiswell on 09/06/2019 via telephone.      I clearly identified myself as Gillis Santa, MD. I verified that I was speaking with the correct person using two identifiers (Name: Sandra [name], and date of birth: Jul 04, 1955).  Consent I sought verbal advanced consent from Sandra [name] for virtual visit interactions. I informed [name] of possible security and privacy concerns, risks, and limitations associated with providing "not-in-person" medical evaluation and management services. I also informed [name] of the availability of "in-person" appointments. Finally, I informed her that  there would be a charge for the virtual visit and that she could be  personally, fully or partially, financially responsible for it. [name] expressed understanding and agreed to proceed.   Historic Elements   Sandra [name] is a 64 y.o. year old, female patient evaluated today after her last contact with our practice on 08/09/2019. [name]  has a past medical history of Anemia, Anxiety (08/01/2014), Arthritis, degenerative (07/30/2013), Clinical depression (06/30/2014), Depression, Elevated liver enzymes, GERD (gastroesophageal reflux disease), H/O breast biopsy (01/05/2015), H/O: attempted suicide (2013), Hepatitis B, Hiatal hernia, History of hysterectomy (01/05/2015), History of peptic ulcer disease (01/05/2015), Hypercholesteremia, Hypertension, Legally blind, Panic attacks, PUD (peptic ulcer disease), Rectocele (01/05/2015), Retinitis pigmentosa, and S/P cholecystectomy (01/05/2015). She also  has a past surgical history that includes Cholecystectomy; Rectocele repair; Abdominal hysterectomy; Appendectomy; Colonoscopy; Esophagogastroduodenoscopy; Sphincterotomy (N/A, 01/24/2017); Hemorrhoid surgery (N/A, 03/07/2017); Hernia repair; Esophagogastroduodenoscopy (egd) with propofol (N/A, 09/17/2017); Oophorectomy; and Breast biopsy (Bilateral, 1977). Sandra [name] has a current medication list which includes the following prescription(s): acetaminophen, albuterol, alprazolam, amitriptyline, amlodipine, amlodipine-benazepril, aspirin, cyclobenzaprine, diclofenac sodium, esomeprazole, famotidine, fluticasone, gabapentin, hydrochlorothiazide, levocetirizine, meclizine, melatonin, montelukast, naproxen, ondansetron, promethazine, robitussin cough+chest cong dm, simvastatin, sucralfate, temazepam, [START ON 10/18/2019] tramadol, vitamin b-12, and lansoprazole. She  reports that she quit smoking about 46 years ago. She has never used smokeless tobacco. She reports that she does not drink alcohol and does not  use drugs. Sandra [name] is allergic to trazodone, acetaminophen-codeine, codeine, oxycontin [oxycodone hcl], trazodone and nefazodone, vicodin [hydrocodone-acetaminophen], zolpidem, hydrocodone-acetaminophen, and oxycodone-acetaminophen.   HPI  Today, she is being contacted for both, medication management and a post-procedure assessment.   Post-Procedure Evaluation  Procedure:   Type: Therapeutic Glenohumeral Joint (shoulder) Injection #2  Primary Purpose: Therapeutic Region: Posterior Shoulder Area Level:  Shoulder Target Area: Glenohumeral Joint (shoulder) Approach: Posterior approach. Laterality: Right-Sided  Sedation: Please see nurses note.  Effectiveness during initial hour after procedure(Ultra-Short Term Relief): 100 %   Local anesthetic used: Long-acting (4-6 hours) Effectiveness: Defined as any analgesic benefit obtained secondary to the administration of local anesthetics. This carries significant diagnostic value as to the etiological location, or anatomical origin, of the pain. Duration of benefit is expected to coincide with the duration of the local anesthetic used.  Effectiveness during initial 4-6 hours after procedure(Short-Term Relief): 100 %   Long-term benefit: Defined as any relief past the pharmacologic duration of the local anesthetics.  Effectiveness past the initial 6 hours after procedure(Long-Term Relief): 100 % (shoulder was doing great until she fell approx June 12 and reinjured the shoulder.)   Current benefits: Defined as benefit that persist at this time.   Analgesia:  <50% better Function: Back to baseline ROM: Back to baseline  Pharmacotherapy Assessment  Analgesic:  08/18/2019  4   06/16/2019  Tramadol Hcl 50 MG Tablet  120.00  30 Bi Lat   [id number]   [place] (1669)   2/3  20.00 MME  Medicare   [place]     Monitoring: [place] PMP: PDMP reviewed during this encounter.       Pharmacotherapy: No side-effects or adverse reactions reported. Compliance: No  problems identified. Effectiveness: Clinically acceptable. Plan: Refer to "POC".  UDS:  Summary  Date Value Ref Range Status  11/24/2018 Note  Final    Comment:    ==================================================================== [employer] Select 13 (MW) ==================================================================== Specimen Alert Note: Urinary creatinine is low; ability to detect some drugs may be compromised. Interpret results with caution. (Creatinine) ==================================================================== Test                             Result       Flag       Units Drug Present and Declared for Prescription Verification   Oxazepam                       627          EXPECTED   ng/mg creat   Temazepam                      5820         EXPECTED   ng/mg creat    Oxazepam and temazepam are expected metabolites of diazepam.    Oxazepam is also an expected metabolite of other benzodiazepine    drugs, including chlordiazepoxide, prazepam, clorazepate, halazepam,    and temazepam.  Oxazepam and temazepam are available as scheduled    prescription medications.   Tramadol                       >33333       EXPECTED   ng/mg creat   O-Desmethyltramadol            >33333       EXPECTED   ng/mg creat   N-Desmethyltramadol            16893        EXPECTED   ng/mg creat    Source of tramadol is a prescription medication. O-desmethyltramadol    and N-desmethyltramadol are expected metabolites of tramadol. Drug Absent but Declared for Prescription Verification   Alprazolam  Not Detected UNEXPECTED ng/mg creat ==================================================================== Test                      Result    Flag   Units      Ref Range   Creatinine              15        LL     mg/dL      >=20 ==================================================================== Declared Medications:  The flagging and interpretation on this report are based on  the  following declared medications.  Unexpected results may arise from  inaccuracies in the declared medications.  **Note: The testing scope of this panel includes these medications:  Alprazolam  Temazepam  Tramadol  **Note: The testing scope of this panel does not include the  following reported medications:  Acetaminophen  Albuterol  Amlodipine  Aspirin  Benazepril  Benzonatate  Buspirone  Cyanocobalamin  Cyclobenzaprine  Escitalopram (Lexapro)  Eszopiclone  Fluticasone  Gabapentin  Hydrochlorothiazide  Lansoprazole  Levocetirizine  Lidocaine  Meclizine  Melatonin  Montelukast  Ondansetron  Simvastatin ==================================================================== For clinical consultation, please call [phone]. ====================================================================     Laboratory Chemistry Profile   Renal Lab Results  Component Value Date   BUN 18 01/16/2017   CREATININE 0.75 01/16/2017   GFRAA >60 01/16/2017   GFRNONAA >60 01/16/2017     Hepatic Lab Results  Component Value Date   AST 24 05/13/2015   ALT 13 (L) 05/13/2015   ALBUMIN 4.5 05/13/2015   ALKPHOS 75 05/13/2015     Electrolytes Lab Results  Component Value Date   NA 138 01/16/2017   K 4.2 01/16/2017   CL 101 01/16/2017   CALCIUM 9.4 01/16/2017   MG 2.0 02/07/2015     Bone Lab Results  Component Value Date   25OHVITD1 19 (L) 02/07/2015   25OHVITD2 3.9 02/07/2015   25OHVITD3 15 02/07/2015     Inflammation (CRP: Acute Phase) (ESR: Chronic Phase) Lab Results  Component Value Date   CRP <0.5 02/07/2015   ESRSEDRATE 26 02/07/2015       Note: Above Lab results reviewed.   Imaging  DG PAIN CLINIC C-ARM 1-60 MIN NO REPORT Fluoro was used, but no Radiologist interpretation will be provided.  Please refer to "NOTES" tab for provider progress note.  Assessment  The primary encounter diagnosis was Localized osteoarthritis of right shoulder. Diagnoses  of Arthropathy of right elbow, Chronic radicular lumbar pain, Chronic lower extremity pain (Bilateral), Lumbar facet syndrome (Bilateral) (R>L), Bilateral primary osteoarthritis of knee, and Chronic pain syndrome were also pertinent to this visit.  Plan of Care   Ms. Dekayla Prestridge has a current medication list which includes the following long-term medication(s): albuterol, amitriptyline, cyclobenzaprine, esomeprazole, famotidine, fluticasone, gabapentin, hydrochlorothiazide, levocetirizine, montelukast, promethazine, simvastatin, and sucralfate.  1.  Right shoulder arthropathy: Responded well to right shoulder intra-articular steroid injection until she had her fall which has caused her shoulder to start hurting again.  Wants to hold off on repeating right shoulder injection.  As needed order placed 2.  Refill tramadol as below.  Patient has prescription at pharmacy for the month of August so next prescription will be for fill date August 30 with 2 refills. 3.  Continue gabapentin and multimodal analgesics as prescribed.  Pharmacotherapy (Medications Ordered): Meds ordered this encounter  Medications  . traMADol (ULTRAM) 50 MG tablet    Sig: Take 2 tablets (100 mg total) by mouth every 12 (twelve) hours as needed for  severe pain. For chronic pain syndrome    Dispense:  120 tablet    Refill:  2   Orders:  Orders Placed This Encounter  Procedures  . SHOULDER INJECTION    For shoulder pain.    Standing Status:   Standing    Number of Occurrences:   1    Standing Expiration Date:   09/05/2020    Scheduling Instructions:     Side: RIGHT     Sedation: Patient's choice.     TIMEFRAME: PRN procedure. (Ms. Ulatowski will call when needed.)    Order Specific Question:   Where will this procedure be performed?    Answer:   [facility]    Comments:   [facility]   Follow-up plan:   Return in about 4 months (around 01/04/2020) for Medication Management, in person.     Status post  bilateral intra-articular knee steroid, right elbow injection on 04/05/2019.  Status post L4-L5 ESI, right-sided along with right shoulder steroid injection on 06/16/2019, right shoulder injection on 07/28/2019, posterior approach       Recent Visits Date Type Provider Dept  07/28/19 Procedure visit Gillis Santa, MD [facility]  07/15/19 Telemedicine Gillis Santa, MD [facility]  06/16/19 Procedure visit Gillis Santa, MD [facility]  Showing recent visits within past 90 days and meeting all other requirements Today's Visits Date Type Provider Dept  09/06/19 Telemedicine Gillis Santa, MD [facility]  Showing today's visits and meeting all other requirements Future Appointments Date Type Provider Dept  09/21/19 Appointment Gillis Santa, MD [facility]  Showing future appointments within next 90 days and meeting all other requirements  I discussed the assessment and treatment plan with the patient. The patient was provided an opportunity to ask questions and all were answered. The patient agreed with the plan and demonstrated an understanding of the instructions.  Patient advised to call back or seek an in-person evaluation if the symptoms or condition worsens.  Duration of encounter: 68mnutes.  Note by: BGillis Santa MD Date: 09/06/2019; Time: 2:30 PM

## 2019-09-08 DIAGNOSIS — R270 Ataxia, unspecified: Secondary | ICD-10-CM | POA: Diagnosis not present

## 2019-09-08 DIAGNOSIS — H6691 Otitis media, unspecified, right ear: Secondary | ICD-10-CM | POA: Diagnosis not present

## 2019-09-21 ENCOUNTER — Telehealth: Payer: PPO | Admitting: Student in an Organized Health Care Education/Training Program

## 2019-09-24 DIAGNOSIS — D649 Anemia, unspecified: Secondary | ICD-10-CM | POA: Diagnosis not present

## 2019-09-24 DIAGNOSIS — E559 Vitamin D deficiency, unspecified: Secondary | ICD-10-CM | POA: Diagnosis not present

## 2019-09-24 DIAGNOSIS — Z79891 Long term (current) use of opiate analgesic: Secondary | ICD-10-CM | POA: Diagnosis not present

## 2019-09-24 DIAGNOSIS — H6691 Otitis media, unspecified, right ear: Secondary | ICD-10-CM | POA: Diagnosis not present

## 2019-09-24 DIAGNOSIS — I10 Essential (primary) hypertension: Secondary | ICD-10-CM | POA: Diagnosis not present

## 2019-09-24 DIAGNOSIS — Z9049 Acquired absence of other specified parts of digestive tract: Secondary | ICD-10-CM | POA: Diagnosis not present

## 2019-09-24 DIAGNOSIS — G8929 Other chronic pain: Secondary | ICD-10-CM | POA: Diagnosis not present

## 2019-09-24 DIAGNOSIS — F418 Other specified anxiety disorders: Secondary | ICD-10-CM | POA: Diagnosis not present

## 2019-09-24 DIAGNOSIS — Z7951 Long term (current) use of inhaled steroids: Secondary | ICD-10-CM | POA: Diagnosis not present

## 2019-09-24 DIAGNOSIS — Z9181 History of falling: Secondary | ICD-10-CM | POA: Diagnosis not present

## 2019-09-24 DIAGNOSIS — H548 Legal blindness, as defined in USA: Secondary | ICD-10-CM | POA: Diagnosis not present

## 2019-09-24 DIAGNOSIS — G602 Neuropathy in association with hereditary ataxia: Secondary | ICD-10-CM | POA: Diagnosis not present

## 2019-10-05 DIAGNOSIS — D649 Anemia, unspecified: Secondary | ICD-10-CM | POA: Diagnosis not present

## 2019-10-05 DIAGNOSIS — G602 Neuropathy in association with hereditary ataxia: Secondary | ICD-10-CM | POA: Diagnosis not present

## 2019-10-05 DIAGNOSIS — H6691 Otitis media, unspecified, right ear: Secondary | ICD-10-CM | POA: Diagnosis not present

## 2019-10-05 DIAGNOSIS — G8929 Other chronic pain: Secondary | ICD-10-CM | POA: Diagnosis not present

## 2019-10-05 DIAGNOSIS — F418 Other specified anxiety disorders: Secondary | ICD-10-CM | POA: Diagnosis not present

## 2019-10-05 DIAGNOSIS — H548 Legal blindness, as defined in USA: Secondary | ICD-10-CM | POA: Diagnosis not present

## 2019-10-05 DIAGNOSIS — I10 Essential (primary) hypertension: Secondary | ICD-10-CM | POA: Diagnosis not present

## 2019-10-13 ENCOUNTER — Telehealth: Payer: Self-pay | Admitting: Student in an Organized Health Care Education/Training Program

## 2019-10-13 NOTE — Telephone Encounter (Signed)
Patient lvmail wants to speak with Dr. Holley Raring or Nurse

## 2019-10-14 NOTE — Telephone Encounter (Signed)
States her lower back is hurting her real bad and she would like an injection.  Will have a virtual appointment set up so that she can discuss an injection with Dr Holley Raring.  PM Admin, please schedule a virtual appointment with DR Holley Raring as soon as possible.

## 2019-10-20 ENCOUNTER — Ambulatory Visit
Payer: PPO | Attending: Student in an Organized Health Care Education/Training Program | Admitting: Student in an Organized Health Care Education/Training Program

## 2019-10-20 ENCOUNTER — Telehealth: Payer: Self-pay | Admitting: *Deleted

## 2019-10-20 DIAGNOSIS — M19011 Primary osteoarthritis, right shoulder: Secondary | ICD-10-CM

## 2019-10-20 NOTE — Progress Notes (Signed)
I attempted to call the patient however no response. Voicemail left instructing patient to call front desk office at [phone] to reschedule appointment. -Dr Christel Bai  

## 2019-10-20 NOTE — Telephone Encounter (Signed)
Attempted to call for pre appointment review of allergies/meds. Message left. 

## 2019-10-21 ENCOUNTER — Telehealth: Payer: Self-pay

## 2019-10-21 NOTE — Telephone Encounter (Signed)
Patient called to reschedule virtual appointment that she missed on 10-20-2019.  Can you please reschedule.  Thank you

## 2019-10-27 ENCOUNTER — Encounter: Payer: Self-pay | Admitting: Student in an Organized Health Care Education/Training Program

## 2019-10-28 ENCOUNTER — Ambulatory Visit
Payer: PPO | Attending: Student in an Organized Health Care Education/Training Program | Admitting: Student in an Organized Health Care Education/Training Program

## 2019-10-28 ENCOUNTER — Other Ambulatory Visit: Payer: Self-pay

## 2019-10-28 ENCOUNTER — Encounter: Payer: Self-pay | Admitting: Student in an Organized Health Care Education/Training Program

## 2019-10-28 DIAGNOSIS — M79604 Pain in right leg: Secondary | ICD-10-CM | POA: Diagnosis not present

## 2019-10-28 DIAGNOSIS — M79605 Pain in left leg: Secondary | ICD-10-CM | POA: Diagnosis not present

## 2019-10-28 DIAGNOSIS — G8929 Other chronic pain: Secondary | ICD-10-CM | POA: Diagnosis not present

## 2019-10-28 DIAGNOSIS — M5416 Radiculopathy, lumbar region: Secondary | ICD-10-CM

## 2019-10-28 DIAGNOSIS — G894 Chronic pain syndrome: Secondary | ICD-10-CM | POA: Diagnosis not present

## 2019-10-28 DIAGNOSIS — M19011 Primary osteoarthritis, right shoulder: Secondary | ICD-10-CM | POA: Diagnosis not present

## 2019-10-28 NOTE — Progress Notes (Signed)
Patient: Sandra [name]  Service Category: E/M  Provider: Gillis Santa, MD  DOB: 14-Apr-1955  DOS: 10/28/2019  Location: Office  MRN: [id number]  Setting: Ambulatory outpatient  Referring Provider: Baxter Hire, MD  Type: Established Patient  Specialty: Interventional Pain Management  PCP: Baxter Hire, MD  Location: Home  Delivery: TeleHealth     Virtual Encounter - Pain Management PROVIDER NOTE: Information contained herein reflects review and annotations entered in association with encounter. Interpretation of such information and data should be left to medically-trained personnel. Information provided to patient can be located elsewhere in the medical record under "Patient Instructions". Document created using STT-dictation technology, any transcriptional errors that may result from process are unintentional.    Contact & Pharmacy Preferred: [phone] Home: [phone] (home) Mobile: [phone] (mobile) E-mail: No e-mail address on record  [place], [place] [address] [place] [address] Phone: [phone] Fax: [phone]   Pre-screening  Sandra [name] offered "in-person" vs "virtual" encounter. She indicated preferring virtual for this encounter.   Reason COVID-19*  Social distancing based on CDC and AMA recommendations.   I contacted Sandra [name] on 10/28/2019 via telephone.      I clearly identified myself as Gillis Santa, MD. I verified that I was speaking with the correct person using two identifiers (Name: Caidynce Muzyka, and date of birth: 24-Nov-1955).  Consent I sought verbal advanced consent from Sandra [name] for virtual visit interactions. I informed Sandra [name] of possible security and privacy concerns, risks, and limitations associated with providing "not-in-person" medical evaluation and management services. I also informed Sandra [name] of the availability of "in-person" appointments. Finally, I informed her that  there would be a charge for the virtual visit and that she could be  personally, fully or partially, financially responsible for it. Sandra [name] expressed understanding and agreed to proceed.   Historic Elements   Sandra [name] is a 64 y.o. year old, female patient evaluated today after our last contact on 10/13/2019. Sandra [name]  has a past medical history of Anemia, Anxiety (08/01/2014), Arthritis, degenerative (07/30/2013), Clinical depression (06/30/2014), Depression, Elevated liver enzymes, GERD (gastroesophageal reflux disease), H/O breast biopsy (01/05/2015), H/O: attempted suicide (2013), Hepatitis B, Hiatal hernia, History of hysterectomy (01/05/2015), History of peptic ulcer disease (01/05/2015), Hypercholesteremia, Hypertension, Legally blind, Panic attacks, PUD (peptic ulcer disease), Rectocele (01/05/2015), Retinitis pigmentosa, and S/P cholecystectomy (01/05/2015). She also  has a past surgical history that includes Cholecystectomy; Rectocele repair; Abdominal hysterectomy; Appendectomy; Colonoscopy; Esophagogastroduodenoscopy; Sphincterotomy (N/A, 01/24/2017); Hemorrhoid surgery (N/A, 03/07/2017); Hernia repair; Esophagogastroduodenoscopy (egd) with propofol (N/A, 09/17/2017); Oophorectomy; and Breast biopsy (Bilateral, 1977). Sandra [name] has a current medication list which includes the following prescription(s): acetaminophen, albuterol, amitriptyline, amlodipine, amlodipine-benazepril, aspirin, cyclobenzaprine, esomeprazole, famotidine, fluticasone, gabapentin, hydrochlorothiazide, levocetirizine, meclizine, melatonin, montelukast, naproxen, ondansetron, promethazine, robitussin cough+chest cong dm, sertraline, simvastatin, sucralfate, temazepam, tramadol, and vitamin b-12. She  reports that she quit smoking about 46 years ago. She has never used smokeless tobacco. She reports that she does not drink alcohol and does not use drugs. Sandra [name] is allergic to trazodone, acetaminophen-codeine,  codeine, oxycontin [oxycodone hcl], trazodone and nefazodone, vicodin [hydrocodone-acetaminophen], zolpidem, hydrocodone-acetaminophen, and oxycodone-acetaminophen.   HPI  Today, she is being contacted for worsening of previously known (established) problem  Patient states that she had a fall a couple of weeks ago.  She is unclear as to how it happened.  Since her fall, she is having increased right shoulder pain and low  back pain that is radiating into her right lower extremity in a dermatomal fashion in her anterolateral thigh on the right.  Patient's previous lumbar epidural steroid injection with me was on 06/16/2019 and her previous right shoulder injection was on 07/28/2019.  She states that both of these interventions helped with her low back, leg and right shoulder pain respectively.  She would like to repeat.  Laboratory Chemistry Profile   Renal Lab Results  Component Value Date   BUN 18 01/16/2017   CREATININE 0.75 01/16/2017   GFRAA >60 01/16/2017   GFRNONAA >60 01/16/2017     Hepatic Lab Results  Component Value Date   AST 24 05/13/2015   ALT 13 (L) 05/13/2015   ALBUMIN 4.5 05/13/2015   ALKPHOS 75 05/13/2015     Electrolytes Lab Results  Component Value Date   NA 138 01/16/2017   K 4.2 01/16/2017   CL 101 01/16/2017   CALCIUM 9.4 01/16/2017   MG 2.0 02/07/2015     Bone Lab Results  Component Value Date   25OHVITD1 19 (L) 02/07/2015   25OHVITD2 3.9 02/07/2015   25OHVITD3 15 02/07/2015     Inflammation (CRP: Acute Phase) (ESR: Chronic Phase) Lab Results  Component Value Date   CRP <0.5 02/07/2015   ESRSEDRATE 26 02/07/2015       Note: Above Lab results reviewed.   Assessment  The primary encounter diagnosis was Localized osteoarthritis of right shoulder. Diagnoses of Chronic radicular lumbar pain, Chronic lower extremity pain (Bilateral), and Chronic pain syndrome were also pertinent to this visit.  Plan of Care    1. Localized osteoarthritis of  right shoulder - SHOULDER INJECTION; Future, #3, posterior approach  2. Chronic radicular lumbar pain - Lumbar Epidural Injection; right L4-L5 Future, #2  3. Chronic lower extremity pain (Bilateral) - Lumbar Epidural Injection; right L4-L5 Future, #2  4. Chronic pain syndrome - SHOULDER INJECTION right posterior glenohumeral joint; Future - Lumbar Epidural Injection right L4-L5; Future -Continue multimodal dedication regimen as prescribed including amitriptyline, gabapentin, tramadol.  Orders:  Orders Placed This Encounter  Procedures  . SHOULDER INJECTION    Standing Status:   Future    Standing Expiration Date:   01/27/2020    Scheduling Instructions:     Side: Bilateral     Sedation: with     Timeframe: As soon as schedule allows    Order Specific Question:   Where will this procedure be performed?    Answer:   [facility]    Comments:   [facility]  . Lumbar Epidural Injection    Standing Status:   Future    Standing Expiration Date:   11/27/2019    Scheduling Instructions:     Procedure: Interlaminar Lumbar Epidural Steroid injection (LESI)            Laterality: Midline     Sedation: with     Timeframe: ASAA    Order Specific Question:   Where will this procedure be performed?    Answer:   [facility]   Follow-up plan:   Return in about 2 weeks (around 11/11/2019) for Right shoulder injection #3, right L4-L5 ESI #2, with sedation (40 mins).     Status post bilateral intra-articular knee steroid, right elbow injection on 04/05/2019.  Status post L4-L5 ESI, right-sided along with right shoulder steroid injection on 06/16/2019, right shoulder injection on 07/28/2019, posterior approach        Recent Visits Date Type Provider Dept  09/06/19 Telemedicine Holley Raring,  Carlus Pavlov, MD [facility]  Showing recent visits within past 90 days and meeting all other requirements Today's Visits Date Type Provider Dept  10/28/19 Telemedicine Gillis Santa, MD [facility]  Showing today's visits and meeting all other requirements Future Appointments Date Type Provider Dept  01/04/20 Appointment Gillis Santa, MD [facility]  Showing future appointments within next 90 days and meeting all other requirements  I discussed the assessment and treatment plan with the patient. The patient was provided an opportunity to ask questions and all were answered. The patient agreed with the plan and demonstrated an understanding of the instructions.  Patient advised to call back or seek an in-person evaluation if the symptoms or condition worsens.  Duration of encounter:68mnutes.  Note by: BGillis Santa MD Date: 10/28/2019; Time: 9:50 AM

## 2019-11-04 DIAGNOSIS — I10 Essential (primary) hypertension: Secondary | ICD-10-CM | POA: Diagnosis not present

## 2019-11-15 ENCOUNTER — Ambulatory Visit: Payer: PPO | Admitting: Student in an Organized Health Care Education/Training Program

## 2019-11-17 ENCOUNTER — Telehealth: Payer: Self-pay | Admitting: *Deleted

## 2019-11-17 NOTE — Telephone Encounter (Signed)
Pharmacy given correct instructions for Gabapentin.

## 2019-11-17 NOTE — Telephone Encounter (Signed)
-----   Message from Gillis Santa, MD sent at 11/16/2019  3:57 PM EDT ----- 400 mg BID ----- Message ----- From: Landis Martins, RN Sent: 11/16/2019   1:54 PM EDT To: Gillis Santa, MD  Please clarify directions for Gabapentin. There are 2 different directions on the script. This is the script written 06-16-19, just now being filled.

## 2019-11-22 ENCOUNTER — Telehealth: Payer: Self-pay | Admitting: *Deleted

## 2019-11-22 NOTE — Telephone Encounter (Signed)
Will you refill?

## 2019-11-23 MED ORDER — CYCLOBENZAPRINE HCL 10 MG PO TABS: 10.0000 mg | ORAL_TABLET | Freq: Three times a day (TID) | ORAL | 2 refills | Status: DC | PRN

## 2019-12-23 NOTE — Progress Notes (Signed)
Cancelled visit

## 2019-12-28 DIAGNOSIS — R29898 Other symptoms and signs involving the musculoskeletal system: Secondary | ICD-10-CM | POA: Diagnosis not present

## 2019-12-28 DIAGNOSIS — Z23 Encounter for immunization: Secondary | ICD-10-CM | POA: Diagnosis not present

## 2020-01-04 ENCOUNTER — Encounter: Payer: Self-pay | Admitting: Student in an Organized Health Care Education/Training Program

## 2020-01-04 ENCOUNTER — Other Ambulatory Visit: Payer: Self-pay

## 2020-01-04 ENCOUNTER — Ambulatory Visit
Payer: PPO | Attending: Student in an Organized Health Care Education/Training Program | Admitting: Student in an Organized Health Care Education/Training Program

## 2020-01-04 VITALS — BP 135/76 | HR 80 | Temp 97.6°F | Resp 18 | Ht 61.0 in | Wt 134.0 lb

## 2020-01-04 DIAGNOSIS — M19011 Primary osteoarthritis, right shoulder: Secondary | ICD-10-CM | POA: Insufficient documentation

## 2020-01-04 DIAGNOSIS — M170 Bilateral primary osteoarthritis of knee: Secondary | ICD-10-CM | POA: Insufficient documentation

## 2020-01-04 DIAGNOSIS — M5416 Radiculopathy, lumbar region: Secondary | ICD-10-CM | POA: Diagnosis not present

## 2020-01-04 DIAGNOSIS — M79604 Pain in right leg: Secondary | ICD-10-CM | POA: Insufficient documentation

## 2020-01-04 DIAGNOSIS — M47816 Spondylosis without myelopathy or radiculopathy, lumbar region: Secondary | ICD-10-CM | POA: Diagnosis not present

## 2020-01-04 DIAGNOSIS — M19021 Primary osteoarthritis, right elbow: Secondary | ICD-10-CM | POA: Diagnosis not present

## 2020-01-04 DIAGNOSIS — G894 Chronic pain syndrome: Secondary | ICD-10-CM | POA: Diagnosis not present

## 2020-01-04 DIAGNOSIS — G8929 Other chronic pain: Secondary | ICD-10-CM | POA: Insufficient documentation

## 2020-01-04 DIAGNOSIS — M79605 Pain in left leg: Secondary | ICD-10-CM | POA: Insufficient documentation

## 2020-01-04 MED ORDER — TRAMADOL HCL 50 MG PO TABS: 100.0000 mg | ORAL_TABLET | Freq: Two times a day (BID) | ORAL | 2 refills | Status: DC | PRN

## 2020-01-04 NOTE — Progress Notes (Signed)
PROVIDER NOTE: Information contained herein reflects review and annotations entered in association with encounter. Interpretation of such information and data should be left to medically-trained personnel. Information provided to patient can be located elsewhere in the medical record under "Patient Instructions". Document created using STT-dictation technology, any transcriptional errors that may result from process are unintentional.    Patient: Sandra [name]  Service Category: E/M  Provider: Gillis Santa, MD  DOB: 1955/12/30  DOS: 01/04/2020  Specialty: Interventional Pain Management  MRN: [id number]  Setting: Ambulatory outpatient  PCP: Baxter Hire, MD  Type: Established Patient    Referring Provider: Baxter Hire, MD  Location: Office  Delivery: Face-to-face     HPI  [name], a 64 y.o. year old female, is here today because of her Localized osteoarthritis of right shoulder [M19.011]. [name]'s primary complain today is Back Pain Last encounter: My last encounter with her was on 11/15/2019. Pertinent problems: [name] has Long term current use of opiate analgesic; Long term prescription opiate use; Opiate use; Hallucinations, visual; Psychiatric disorder; Chronic low back pain (Location of Primary Source of Pain) (Bilateral) (R>L); Chronic bilateral knee pain; Opiate dependence ([place]); Lumbar facet arthropathy; Lumbar spondylosis; Lumbar facet syndrome (Bilateral) (R>L); Diffuse myofascial pain syndrome; [name] (CBS); Arthropathy of right elbow; and Localized osteoarthritis of right shoulder on their pertinent problem list. Pain Assessment: Severity of Chronic pain is reported as a 6 /10. Location: Back Lower/radiates into right hip and tailbone. Onset: More than a month ago. Quality: Stabbing, Discomfort, Constant. Timing: Constant. Modifying factor(s): tramadol. Vitals:  height is $RemoveB'5\' 1"'UvyDmZXr$  (1.549 m) and weight is 134 lb (60.8 kg). Her temperature is 97.6  F (36.4 C). Her blood pressure is 135/76 and her pulse is 80. Her respiration is 18 and oxygen saturation is 99%.   Reason for encounter: medication management.   Patient presents today for medication management.  No significant change in her medical history since her last visit with me.  Continues tramadol as prescribed and provides analgesic benefit.  Overall pain is at baseline.  Is doing well in regards to her right shoulder pain.  Does not need an injection at this time.  Complains more so of low back pain today related to lumbar facet arthropathy.  She is having worsening swelling in both of her legs.  We will refill her tramadol as below.  Pharmacotherapy Assessment   12/13/2019  09/06/2019   3  Tramadol Hcl 50 Mg Tablet 120.00  30  [facility]  [id number]  [place] (1669)  2/2  20.00 MME  Medicare  [place]      Analgesic: Tramadol 50 mg 4 times daily as needed, quantity 120/month; MME equals 20  Monitoring: [place] PMP: PDMP reviewed during this encounter.       Pharmacotherapy: No side-effects or adverse reactions reported. Compliance: No problems identified. Effectiveness: Clinically acceptable.  Dewayne Shorter, RN  01/04/2020 12:56 PM  Sign when Signing Visit Nursing Pain Medication Assessment:  Safety precautions to be maintained throughout the outpatient stay will include: orient to surroundings, keep bed in low position, maintain call bell within reach at all times, provide assistance with transfer out of bed and ambulation.  Medication Inspection Compliance: Pill count conducted under aseptic conditions, in front of the patient. Neither the pills nor the bottle was removed from the patient's sight at any time. Once count was completed pills were immediately returned to the patient in their original bottle.  Medication: Tramadol (Ultram) Pill/Patch Count: 50  of 120 pills remain Pill/Patch Appearance: Markings consistent with prescribed medication Bottle Appearance: Standard pharmacy container.  Clearly labeled. Filled Date:10 / 25/ 2021 Last Medication intake:  Today    UDS:  Summary  Date Value Ref Range Status  11/24/2018 Note  Final    Comment:    ==================================================================== ToxASSURE Select 13 (MW) ==================================================================== Specimen Alert Note: Urinary creatinine is low; ability to detect some drugs may be compromised. Interpret results with caution. (Creatinine) ==================================================================== Test                             Result       Flag       Units Drug Present and Declared for Prescription Verification   Oxazepam                       627          EXPECTED   ng/mg creat   Temazepam                      5820         EXPECTED   ng/mg creat    Oxazepam and temazepam are expected metabolites of diazepam.    Oxazepam is also an expected metabolite of other benzodiazepine    drugs, including chlordiazepoxide, prazepam, clorazepate, halazepam,    and temazepam.  Oxazepam and temazepam are available as scheduled    prescription medications.   Tramadol                       >33333       EXPECTED   ng/mg creat   O-Desmethyltramadol            >33333       EXPECTED   ng/mg creat   N-Desmethyltramadol            16893        EXPECTED   ng/mg creat    Source of tramadol is a prescription medication. O-desmethyltramadol    and N-desmethyltramadol are expected metabolites of tramadol. Drug Absent but Declared for Prescription Verification   Alprazolam                     Not Detected UNEXPECTED ng/mg creat ==================================================================== Test                      Result    Flag   Units      Ref Range   Creatinine              15        LL     mg/dL      >=20 ==================================================================== Declared Medications:  The flagging and interpretation on this report are based on the   following declared medications.  Unexpected results may arise from  inaccuracies in the declared medications.  **Note: The testing scope of this panel includes these medications:  Alprazolam  Temazepam  Tramadol  **Note: The testing scope of this panel does not include the  following reported medications:  Acetaminophen  Albuterol  Amlodipine  Aspirin  Benazepril  Benzonatate  Buspirone  Cyanocobalamin  Cyclobenzaprine  Escitalopram (Lexapro)  Eszopiclone  Fluticasone  Gabapentin  Hydrochlorothiazide  Lansoprazole  Levocetirizine  Lidocaine  Meclizine  Melatonin  Montelukast  Ondansetron  Simvastatin ==================================================================== For clinical consultation, please call [phone]. ====================================================================  ROS  Constitutional: Denies any fever or chills Gastrointestinal: No reported hemesis, hematochezia, vomiting, or acute GI distress Musculoskeletal: Positive low back pain Neurological: No reported episodes of acute onset apraxia, aphasia, dysarthria, agnosia, amnesia, paralysis, loss of coordination, or loss of consciousness  Medication Review  Dextromethorphan-guaiFENesin, Melatonin, acetaminophen, albuterol, amLODipine, amitriptyline, amlodipine-benazepril, aspirin, cyclobenzaprine, esomeprazole, famotidine, fluticasone, gabapentin, hydrOXYzine, hydrochlorothiazide, levocetirizine, meclizine, montelukast, naproxen, ondansetron, promethazine, sertraline, simvastatin, sucralfate, temazepam, traMADol, and vitamin B-12  History Review  Allergy: Ms. Brusseau is allergic to trazodone, acetaminophen-codeine, codeine, oxycontin [oxycodone hcl], trazodone and nefazodone, vicodin [hydrocodone-acetaminophen], zolpidem, hydrocodone-acetaminophen, and oxycodone-acetaminophen. Drug: Ms. Mastel  reports no history of drug use. Alcohol:  reports no history of alcohol use. Tobacco:   reports that she quit smoking about 46 years ago. She has never used smokeless tobacco. Social: Ms. Roesler  reports that she quit smoking about 46 years ago. She has never used smokeless tobacco. She reports that she does not drink alcohol and does not use drugs. Medical:  has a past medical history of Anemia, Anxiety (08/01/2014), Arthritis, degenerative (07/30/2013), Clinical depression (06/30/2014), Depression, Elevated liver enzymes, GERD (gastroesophageal reflux disease), H/O breast biopsy (01/05/2015), H/O: attempted suicide (2013), Hepatitis B, Hiatal hernia, History of hysterectomy (01/05/2015), History of peptic ulcer disease (01/05/2015), Hypercholesteremia, Hypertension, Legally blind, Panic attacks, PUD (peptic ulcer disease), Rectocele (01/05/2015), Retinitis pigmentosa, and S/P cholecystectomy (01/05/2015). Surgical: Ms. Lafevers  has a past surgical history that includes Cholecystectomy; Rectocele repair; Abdominal hysterectomy; Appendectomy; Colonoscopy; Esophagogastroduodenoscopy; Sphincterotomy (N/A, 01/24/2017); Hemorrhoid surgery (N/A, 03/07/2017); Hernia repair; Esophagogastroduodenoscopy (egd) with propofol (N/A, 09/17/2017); Oophorectomy; and Breast biopsy (Bilateral, 1977). Family: family history includes Breast cancer in her sister; Cancer in her father; Dementia in her mother; Diabetes in her mother; Hypertension in her mother; Prostate cancer in her father.  Laboratory Chemistry Profile   Renal Lab Results  Component Value Date   BUN 18 01/16/2017   CREATININE 0.75 01/16/2017   GFRAA >60 01/16/2017   GFRNONAA >60 01/16/2017     Hepatic Lab Results  Component Value Date   AST 24 05/13/2015   ALT 13 (L) 05/13/2015   ALBUMIN 4.5 05/13/2015   ALKPHOS 75 05/13/2015     Electrolytes Lab Results  Component Value Date   NA 138 01/16/2017   K 4.2 01/16/2017   CL 101 01/16/2017   CALCIUM 9.4 01/16/2017   MG 2.0 02/07/2015     Bone Lab Results  Component Value Date    25OHVITD1 19 (L) 02/07/2015   25OHVITD2 3.9 02/07/2015   25OHVITD3 15 02/07/2015     Inflammation (CRP: Acute Phase) (ESR: Chronic Phase) Lab Results  Component Value Date   CRP <0.5 02/07/2015   ESRSEDRATE 26 02/07/2015       Note: Above Lab results reviewed.  Recent Imaging Review  DG PAIN CLINIC C-ARM 1-60 MIN NO REPORT Fluoro was used, but no Radiologist interpretation will be provided.  Please refer to "NOTES" tab for provider progress note. Note: Reviewed        Physical Exam  General appearance: Well nourished, well developed, and well hydrated. In no apparent acute distress Mental status: Alert, oriented x 3 (person, place, & time)       Respiratory: No evidence of acute respiratory distress  Vitals: BP 135/76   Pulse 80   Temp 97.6 F (36.4 C)   Resp 18   Ht 5' 1"  (1.549 m)   Wt 134 lb (60.8 kg)   SpO2 99%   BMI 25.32 kg/m  BMI: Estimated body mass  index is 25.32 kg/m as calculated from the following:   Height as of this encounter: 5' 1"  (1.549 m).   Weight as of this encounter: 134 lb (60.8 kg). Ideal: Ideal body weight: 47.8 kg (105 lb 6.1 oz) Adjusted ideal body weight: 53 kg (116 lb 13.2 oz)  Upper Extremity (UE) Exam    Side: Right upper extremity  Side: Left upper extremity  Skin & Extremity Inspection: Skin color, temperature, and hair growth are WNL. No peripheral edema or cyanosis. No masses, redness, swelling, asymmetry, or associated skin lesions. No contractures.  Skin & Extremity Inspection: Skin color, temperature, and hair growth are WNL. No peripheral edema or cyanosis. No masses, redness, swelling, asymmetry, or associated skin lesions. No contractures.  Functional ROM: Decreased ROM for shoulder and elbow  Functional ROM: Decreased ROM for shoulder and elbow  Muscle Tone/Strength: Functionally intact. No obvious neuro-muscular anomalies detected.  Muscle Tone/Strength: Functionally intact. No obvious neuro-muscular anomalies detected.  Sensory  (Neurological): Arthropathic arthralgia          Sensory (Neurological): Arthropathic arthralgia          Palpation: No palpable anomalies              Palpation: No palpable anomalies              Provocative Test(s):  Phalen's test: deferred Tinel's test: deferred Apley's scratch test (touch opposite shoulder):  Action 1 (Across chest): deferred Action 2 (Overhead): deferred Action 3 (LB reach): deferred   Provocative Test(s):  Phalen's test: deferred Tinel's test: deferred Apley's scratch test (touch opposite shoulder):  Action 1 (Across chest): deferred Action 2 (Overhead): deferred Action 3 (LB reach): deferred    Lumbar Spine Area Exam  Skin & Axial Inspection: No masses, redness, or swelling Alignment: Symmetrical Functional ROM: Pain restricted ROM       Stability: No instability detected Muscle Tone/Strength: Functionally intact. No obvious neuro-muscular anomalies detected. Sensory (Neurological): Musculoskeletal pain pattern  Gait & Posture Assessment  Ambulation: Patient came in today in a wheel chair Gait: Limited. Using assistive device to ambulate Posture: Difficulty standing up straight, due to pain  Lower Extremity Exam    Side: Right lower extremity  Side: Left lower extremity  Stability: No instability observed          Stability: No instability observed          Skin & Extremity Inspection: Pitting edema  Skin & Extremity Inspection: Pitting edema  Functional ROM: Pain restricted ROM for hip and knee joints          Functional ROM: Pain restricted ROM for hip and knee joints          Muscle Tone/Strength: Functionally intact. No obvious neuro-muscular anomalies detected.  Muscle Tone/Strength: Functionally intact. No obvious neuro-muscular anomalies detected.  Sensory (Neurological): Musculoskeletal pain pattern        Sensory (Neurological): Musculoskeletal pain pattern        DTR: Patellar: deferred today Achilles: deferred today Plantar: deferred today   DTR: Patellar: deferred today Achilles: deferred today Plantar: deferred today  Palpation: No palpable anomalies  Palpation: No palpable anomalies    Assessment   Status Diagnosis  Controlled Controlled Controlled 1. Localized osteoarthritis of right shoulder   2. Chronic radicular lumbar pain   3. Chronic lower extremity pain (Bilateral)   4. Arthropathy of right elbow   5. Lumbar facet syndrome (Bilateral) (R>L)   6. Lumbar facet arthropathy   7. Bilateral primary osteoarthritis of knee  8. Chronic pain syndrome      Updated Problems: Problem  Localized Osteoarthritis of Right Shoulder  Arthropathy of Right Elbow  [name] (CBS)   [name] (CBS) is a condition that causes phantom images in sane, vision impaired people. The images can be vivid, complex, animated, colorful, dreamlike images to less complex visions of people, animals, vehicles, houses, and similar every day images. Adults with CPS are usually in good mental health. They understand that these images are not real. In addition, the delusions associated with CPS are solely visual and do not occur in conjunction with other senses (hearing, smell, taste, or touch). Patients with retinitis pigmentosa are most likely to develop CBS.  Overview:  Last Assessment & Plan:  The patient continues to have visual hallucinations involving people, including her dead husband. She is pending to have an MRI of the brain done on 03/10/2015, followed by a neurology evaluation. The patient is also scheduled to have a psychiatric evaluation for her chronic depression. We will have the psychiatrist also evaluated her for the possibility of these hallucinations being nonorganic.   Long Term Current Use of Opiate Analgesic  Long Term Prescription Opiate Use  Opiate Use  Hallucinations, Visual  Psychiatric Disorder  Chronic low back pain (Location of Primary Source of Pain) (Bilateral) (R>L)  Chronic  bilateral knee pain  Opiate Dependence ([facility])  Lumbar Facet Arthropathy  Lumbar Spondylosis  Lumbar facet syndrome (Bilateral) (R>L)  Diffuse Myofascial Pain Syndrome    Plan of Care  Ms. Marion Seese has a current medication list which includes the following long-term medication(s): albuterol, amitriptyline, cyclobenzaprine, esomeprazole, famotidine, famotidine, fluticasone, gabapentin, hydrochlorothiazide, levocetirizine, montelukast, promethazine, simvastatin, and sucralfate.  Pharmacotherapy (Medications Ordered): Meds ordered this encounter  Medications  . traMADol (ULTRAM) 50 MG tablet    Sig: Take 2 tablets (100 mg total) by mouth every 12 (twelve) hours as needed for severe pain. For chronic pain syndrome    Dispense:  120 tablet    Refill:  2   Orders:  Orders Placed This Encounter  Procedures  . ToxASSURE Select 13 (MW), Urine    Volume: 30 ml(s). Minimum 3 ml of urine is needed. Document temperature of fresh sample. Indications: Long term (current) use of opiate analgesic [phone])    Order Specific Question:   Release to patient    Answer:   Immediate   Follow-up plan:   Return in about 3 months (around 04/05/2020) for Medication Management, in person.     Status post bilateral intra-articular knee steroid, right elbow injection on 04/05/2019.  Status post L4-L5 ESI, right-sided along with right shoulder steroid injection on 06/16/2019, right shoulder injection on 07/28/2019, posterior approach         Recent Visits Date Type Provider Dept  10/28/19 Telemedicine Gillis Santa, MD [facility]  Showing recent visits within past 90 days and meeting all other requirements Today's Visits Date Type Provider Dept  01/04/20 Office Visit Gillis Santa, MD [facility]  Showing today's visits and meeting all other requirements Future Appointments No visits were found meeting these conditions. Showing future appointments within next 90 days and meeting all  other requirements  I discussed the assessment and treatment plan with the patient. The patient was provided an opportunity to ask questions and all were answered. The patient agreed with the plan and demonstrated an understanding of the instructions.  Patient advised to call back or seek an in-person evaluation if the symptoms or condition worsens.  Duration of encounter: 30 minutes.  Note by: Gillis Santa, MD Date: 01/04/2020; Time: 1:26 PM

## 2020-01-04 NOTE — Progress Notes (Signed)
Nursing Pain Medication Assessment:  Safety precautions to be maintained throughout the outpatient stay will include: orient to surroundings, keep bed in low position, maintain call bell within reach at all times, provide assistance with transfer out of bed and ambulation.  Medication Inspection Compliance: Pill count conducted under aseptic conditions, in front of the patient. Neither the pills nor the bottle was removed from the patient's sight at any time. Once count was completed pills were immediately returned to the patient in their original bottle.  Medication: Tramadol (Ultram) Pill/Patch Count: 50 of 120 pills remain Pill/Patch Appearance: Markings consistent with prescribed medication Bottle Appearance: Standard pharmacy container. Clearly labeled. Filled Date:10 / 25/ 2021 Last Medication intake:  Today

## 2020-01-10 LAB — TOXASSURE SELECT 13 (MW), URINE

## 2020-01-18 ENCOUNTER — Telehealth: Payer: Self-pay | Admitting: Student in an Organized Health Care Education/Training Program

## 2020-01-18 DIAGNOSIS — M19021 Primary osteoarthritis, right elbow: Secondary | ICD-10-CM

## 2020-01-18 DIAGNOSIS — M170 Bilateral primary osteoarthritis of knee: Secondary | ICD-10-CM

## 2020-01-18 NOTE — Telephone Encounter (Signed)
Patient wants to get injections in her knees and her right elbow. She is having pain in both knees and her right hand. Would like to get this done asap. I did explain that dr Holley Raring is booked up until Dec 20 and she says that will be ok. Please ask dr. Holley Raring to put orders in for what he wants and we can get her scheduled

## 2020-02-09 ENCOUNTER — Ambulatory Visit: Payer: PPO | Admitting: Student in an Organized Health Care Education/Training Program

## 2020-03-01 ENCOUNTER — Encounter: Payer: Self-pay | Admitting: Student in an Organized Health Care Education/Training Program

## 2020-03-01 ENCOUNTER — Ambulatory Visit
Payer: PPO | Attending: Student in an Organized Health Care Education/Training Program | Admitting: Student in an Organized Health Care Education/Training Program

## 2020-03-01 ENCOUNTER — Other Ambulatory Visit: Payer: Self-pay

## 2020-03-01 VITALS — BP 151/67 | HR 88 | Temp 97.9°F | Resp 18 | Ht 60.0 in | Wt 134.0 lb

## 2020-03-01 DIAGNOSIS — M170 Bilateral primary osteoarthritis of knee: Secondary | ICD-10-CM | POA: Diagnosis not present

## 2020-03-01 DIAGNOSIS — G894 Chronic pain syndrome: Secondary | ICD-10-CM | POA: Diagnosis not present

## 2020-03-01 DIAGNOSIS — M19021 Primary osteoarthritis, right elbow: Secondary | ICD-10-CM | POA: Diagnosis not present

## 2020-03-01 MED ORDER — LIDOCAINE HCL (PF) 2 % IJ SOLN
INTRAMUSCULAR | Status: AC
  Filled 2020-03-01: qty 5

## 2020-03-01 MED ORDER — LIDOCAINE HCL (PF) 2 % IJ SOLN
INTRAMUSCULAR | Status: AC
  Filled 2020-03-01: qty 10

## 2020-03-01 MED ORDER — DEXAMETHASONE SODIUM PHOSPHATE 10 MG/ML IJ SOLN
INTRAMUSCULAR | Status: AC
  Filled 2020-03-01: qty 1

## 2020-03-01 MED ORDER — ROPIVACAINE HCL 2 MG/ML IJ SOLN
9.0000 mL | Freq: Once | INTRAMUSCULAR | Status: AC
  Administered 2020-03-01: 9 mL via PERINEURAL

## 2020-03-01 MED ORDER — DEXAMETHASONE SODIUM PHOSPHATE 10 MG/ML IJ SOLN
10.0000 mg | Freq: Once | INTRAMUSCULAR | Status: AC
  Administered 2020-03-01: 10 mg

## 2020-03-01 MED ORDER — LIDOCAINE HCL 2 % IJ SOLN
20.0000 mL | Freq: Once | INTRAMUSCULAR | Status: AC
  Administered 2020-03-01: 200 mg
  Filled 2020-03-01: qty 20

## 2020-03-01 MED ORDER — ROPIVACAINE HCL 2 MG/ML IJ SOLN
INTRAMUSCULAR | Status: AC
  Filled 2020-03-01: qty 10

## 2020-03-01 MED ORDER — METHYLPREDNISOLONE ACETATE 80 MG/ML IJ SUSP
INTRAMUSCULAR | Status: AC
  Filled 2020-03-01: qty 1

## 2020-03-01 MED ORDER — METHYLPREDNISOLONE ACETATE 80 MG/ML IJ SUSP
80.0000 mg | Freq: Once | INTRAMUSCULAR | Status: AC
  Administered 2020-03-01: 80 mg via INTRA_ARTICULAR

## 2020-03-01 NOTE — Progress Notes (Signed)
PROVIDER NOTE: Information contained herein reflects review and annotations entered in association with encounter. Interpretation of such information and data should be left to medically-trained personnel. Information provided to patient can be located elsewhere in the medical record under "Patient Instructions". Document created using STT-dictation technology, any transcriptional errors that may result from process are unintentional.    Patient: Sandra [name]  Service Category: Procedure  Provider: Gillis Santa, MD  DOB: 1955-09-28  DOS: 03/01/2020  Location: [facility]  MRN: [id number]  Setting: Ambulatory - outpatient  Referring Provider: Baxter Hire, MD  Type: Established Patient  Specialty: Interventional Pain Management  PCP: Baxter Hire, MD   Primary Reason for Visit: Interventional Pain Management Treatment. CC: Knee Pain (bilateral) and Elbow Pain (right)  Procedure:          Anesthesia, Analgesia, Anxiolysis:  Type: Therapeutic Intra-Articular Local anesthetic and steroid Knee Injection #2  Region: Medial infrapatellar Knee Region Level: Knee Joint Laterality: Bilateral  Type: Local Anesthesia Indication(s): Analgesia         Local Anesthetic: Lidocaine 1-2% Route: Infiltration ([place]/IM) IV Access: Declined Sedation: Declined   Position: Sitting   Indications: 1. Bilateral primary osteoarthritis of knee   2. Arthropathy of right elbow   3. Chronic pain syndrome    Pain Score: Pre-procedure: 5 /10 Post-procedure: 5  (4 = right elbow)/10   Pre-op Assessment:  Sandra [name] is a 65 y.o. (year old), female patient, seen today for interventional treatment. She  has a past surgical history that includes Cholecystectomy; Rectocele repair; Abdominal hysterectomy; Appendectomy; Colonoscopy; Esophagogastroduodenoscopy; Sphincterotomy (N/A, 01/24/2017); Hemorrhoid surgery (N/A, 03/07/2017); Hernia repair; Esophagogastroduodenoscopy (egd) with propofol (N/A,  09/17/2017); Oophorectomy; and Breast biopsy (Bilateral, 1977). Sandra [name] has a current medication list which includes the following prescription(s): acetaminophen, albuterol, amitriptyline, amlodipine, amlodipine-benazepril, cyclobenzaprine, esomeprazole, famotidine, fluticasone, gabapentin, hydrochlorothiazide, hydroxyzine, levocetirizine, meclizine, melatonin, montelukast, naproxen, ondansetron, promethazine, robitussin cough+chest cong dm, sertraline, simvastatin, sucralfate, temazepam, tramadol, vitamin b-12, aspirin, and famotidine. Her primarily concern today is the Knee Pain (bilateral) and Elbow Pain (right)  Initial Vital Signs:  Pulse/HCG Rate: 88  Temp: 97.9 F (36.6 C) Resp: 18 BP: (!) 151/67 SpO2: 99 %  BMI: Estimated body mass index is 26.17 kg/m as calculated from the following:   Height as of this encounter: 5' (1.524 m).   Weight as of this encounter: 134 lb (60.8 kg).  Risk Assessment: Allergies: Reviewed. She is allergic to trazodone, acetaminophen-codeine, codeine, oxycontin [oxycodone hcl], trazodone and nefazodone, vicodin [hydrocodone-acetaminophen], zolpidem, hydrocodone-acetaminophen, and oxycodone-acetaminophen.  Allergy Precautions: None required Coagulopathies: Reviewed. None identified.  Blood-thinner therapy: None at this time Active Infection(s): Reviewed. None identified. Sandra [name] is afebrile  Site Confirmation: Sandra [name] was asked to confirm the procedure and laterality before marking the site Procedure checklist: Completed Consent: Before the procedure and under the influence of no sedative(s), amnesic(s), or anxiolytics, the patient was informed of the treatment options, risks and possible complications. To fulfill our ethical and legal obligations, as recommended by the American Medical Association's Code of Ethics, I have informed the patient of my clinical impression; the nature and purpose of the treatment or procedure; the risks, benefits, and  possible complications of the intervention; the alternatives, including doing nothing; the risk(s) and benefit(s) of the alternative treatment(s) or procedure(s); and the risk(s) and benefit(s) of doing nothing. The patient was provided information about the general risks and possible complications associated with the procedure. These may include, but are not limited to: failure to achieve desired goals,  infection, bleeding, organ or nerve damage, allergic reactions, paralysis, and death. In addition, the patient was informed of those risks and complications associated to the procedure, such as failure to decrease pain; infection; bleeding; organ or nerve damage with subsequent damage to sensory, motor, and/or autonomic systems, resulting in permanent pain, numbness, and/or weakness of one or several areas of the body; allergic reactions; (i.e.: anaphylactic reaction); and/or death. Furthermore, the patient was informed of those risks and complications associated with the medications. These include, but are not limited to: allergic reactions (i.e.: anaphylactic or anaphylactoid reaction(s)); adrenal axis suppression; blood sugar elevation that in diabetics may result in ketoacidosis or comma; water retention that in patients with history of congestive heart failure may result in shortness of breath, pulmonary edema, and decompensation with resultant heart failure; weight gain; swelling or edema; medication-induced neural toxicity; particulate matter embolism and blood vessel occlusion with resultant organ, and/or nervous system infarction; and/or aseptic necrosis of one or more joints. Finally, the patient was informed that Medicine is not an exact science; therefore, there is also the possibility of unforeseen or unpredictable risks and/or possible complications that may result in a catastrophic outcome. The patient indicated having understood very clearly. We have given the patient no guarantees and we have  made no promises. Enough time was given to the patient to ask questions, all of which were answered to the patient's satisfaction. Sandra [name] has indicated that she wanted to continue with the procedure. Attestation: I, the ordering provider, attest that I have discussed with the patient the benefits, risks, side-effects, alternatives, likelihood of achieving goals, and potential problems during recovery for the procedure that I have provided informed consent. Date  Time: 03/01/2020 12:59 PM  Pre-Procedure Preparation:  Monitoring: As per clinic protocol. Respiration, ETCO2, SpO2, BP, heart rate and rhythm monitor placed and checked for adequate function Safety Precautions: Patient was assessed for positional comfort and pressure points before starting the procedure. Time-out: I initiated and conducted the "Time-out" before starting the procedure, as per protocol. The patient was asked to participate by confirming the accuracy of the "Time Out" information. Verification of the correct person, site, and procedure were performed and confirmed by me, the nursing staff, and the patient. "Time-out" conducted as per Joint Commission's Universal Protocol (UP.01.01.01). Time: 1345  Description of Procedure:          Target Area: Knee Joint Approach: Just above the Medial tibial plateau, lateral to the infrapatellar tendon. Area Prepped: Entire knee area, from the mid-thigh to the mid-shin. Prepping solution: DuraPrep (Iodine Povacrylex [0.7% available iodine] and Isopropyl Alcohol, 74% w/w) Safety Precautions: Aspiration looking for blood return was conducted prior to all injections. At no point did we inject any substances, as a needle was being advanced. No attempts were made at seeking any paresthesias. Safe injection practices and needle disposal techniques used. Medications properly checked for expiration dates. SDV (single dose vial) medications used. Description of the Procedure: Protocol guidelines  were followed. The patient was placed in position over the fluoroscopy table. The target area was identified and the area prepped in the usual manner. Skin & deeper tissues infiltrated with local anesthetic. Appropriate amount of time allowed to pass for local anesthetics to take effect. The procedure needles were then advanced to the target area. Proper needle placement secured. Negative aspiration confirmed. Solution injected in intermittent fashion, asking for systemic symptoms every 0.5cc of injectate. The needles were then removed and the area cleansed, making sure to leave some of the  prepping solution back to take advantage of its long term bactericidal properties. Vitals:   03/01/20 1313  BP: (!) 151/67  Pulse: 88  Resp: 18  Temp: 97.9 F (36.6 C)  TempSrc: Temporal  SpO2: 99%  Weight: 134 lb (60.8 kg)  Height: 5' (1.524 m)    Start Time: 1345 hrs. End Time: 1352 hrs. Materials:  Needle(s) Type: Regular needle Gauge: 25G Length: 1.5-in Medication(s): Please see orders for medications and dosing details. 8 cc solution made of 7 cc of 0.2% ropivacaine, 1 cc of methylprednisolone, 80 mg/cc.  4 cc injected in each knee, bilateral.   Post-operative Assessment:  Post-procedure Vital Signs:  Pulse/HCG Rate: 88  Temp: 97.9 F (36.6 C) Resp: 18 BP: (!) 151/67 SpO2: 99 %  EBL: None  Complications: No immediate post-treatment complications observed by team, or reported by patient.  Note: The patient tolerated the entire procedure well. A repeat set of vitals were taken after the procedure and the patient was kept under observation following institutional policy, for this type of procedure. Post-procedural neurological assessment was performed, showing return to baseline, prior to discharge. The patient was provided with post-procedure discharge instructions, including a section on how to identify potential problems. Should any problems arise concerning this procedure, the patient was  given instructions to immediately contact us, at any time, without hesitation. In any case, we plan to contact the patient by telephone for a follow-up status report regarding this interventional procedure.  Comments:  No additional relevant information.  Plan of Care   Medications ordered for procedure: Meds ordered this encounter  Medications  . lidocaine (XYLOCAINE) 2 % (with pres) injection 400 mg  . ropivacaine (PF) 2 mg/mL (0.2%) (NAROPIN) injection 9 mL  . methylPREDNISolone acetate (DEPO-MEDROL) injection 80 mg  . dexamethasone (DECADRON) injection 10 mg   Medications administered: We administered lidocaine, ropivacaine (PF) 2 mg/mL (0.2%), methylPREDNISolone acetate, and dexamethasone.  See the medical record for exact dosing, route, and time of administration.  Follow-up plan:   Return for Keep sch. appt.      Status post bilateral intra-articular knee steroid, right elbow injection on 04/05/2019, 03/01/20   Recent Visits Date Type Provider Dept  01/04/20 Office Visit Gillis Santa, MD [facility]  Showing recent visits within past 90 days and meeting all other requirements Today's Visits Date Type Provider Dept  03/01/20 Procedure visit Gillis Santa, MD [facility]  Showing today's visits and meeting all other requirements Future Appointments Date Type Provider Dept  03/29/20 Appointment Gillis Santa, MD [facility]  Showing future appointments within next 90 days and meeting all other requirements  Disposition: Discharge home  Discharge (Date  Time): 03/01/2020; 1354 hrs.   Primary Care Physician: Baxter Hire, MD Location: [facility] Note by: Gillis Santa, MD Date: 03/01/2020; Time: 2:05 PM  Disclaimer:  Medicine is not an exact science. The only guarantee in medicine is that nothing is guaranteed. It is important to note that the decision to proceed with this intervention was based on the  information collected from the patient. The Data and conclusions were drawn from the patient's questionnaire, the interview, and the physical examination. Because the information was provided in large part by the patient, it cannot be guaranteed that it has not been purposely or unconsciously manipulated. Every effort has been made to obtain as much relevant data as possible for this evaluation. It is important to note that the conclusions that lead to this procedure are  derived in large part from the available data. Always take into account that the treatment [name] also be dependent on availability of resources and existing treatment guidelines, considered by other Pain Management Practitioners as being common knowledge and practice, at the time of the intervention. For Medico-Legal purposes, it is also important to point out that variation in procedural techniques and pharmacological choices are the acceptable norm. The indications, contraindications, technique, and results of the above procedure should only be interpreted and judged by a Board-Certified Interventional Pain Specialist with extensive familiarity and expertise in the same exact procedure and technique.

## 2020-03-01 NOTE — Progress Notes (Signed)
Safety precautions to be maintained throughout the outpatient stay will include: orient to surroundings, keep bed in low position, maintain call bell within reach at all times, provide assistance with transfer out of bed and ambulation.  

## 2020-03-01 NOTE — Progress Notes (Signed)
PROVIDER NOTE: Information contained herein reflects review and annotations entered in association with encounter. Interpretation of such information and data should be left to medically-trained personnel. Information provided to patient can be located elsewhere in the medical record under "Patient Instructions". Document created using STT-dictation technology, any transcriptional errors that may result from process are unintentional.    Patient: Sandra [name]  Service Category: Procedure  Provider: Gillis Santa, MD  DOB: 1955-10-05  DOS: 03/01/2020  Location: [facility]  MRN: [id number]  Setting: Ambulatory - outpatient  Referring Provider: Baxter Hire, MD  Type: Established Patient  Specialty: Interventional Pain Management  PCP: Baxter Hire, MD   Primary Reason for Visit: Interventional Pain Management Treatment. CC: Knee Pain (bilateral) and Elbow Pain (right)  Procedure:          Anesthesia, Analgesia, Anxiolysis:  Type: Therapeutic Elbow Joint Steroid Injection  #2  Region: Lateral Elbow Area Level: Elbow Laterality: Right-Sided  Type: Local Anesthesia    Indications: Right elbow arthritis  Pain Score: Pre-procedure: 5 /10 Post-procedure: 4/10   Pre-op Assessment:  Sandra [name] is a 65 y.o. (year old), female patient, seen today for interventional treatment. She  has a past surgical history that includes Cholecystectomy; Rectocele repair; Abdominal hysterectomy; Appendectomy; Colonoscopy; Esophagogastroduodenoscopy; Sphincterotomy (N/A, 01/24/2017); Hemorrhoid surgery (N/A, 03/07/2017); Hernia repair; Esophagogastroduodenoscopy (egd) with propofol (N/A, 09/17/2017); Oophorectomy; and Breast biopsy (Bilateral, 1977). Sandra [name] has a current medication list which includes the following prescription(s): acetaminophen, albuterol, amitriptyline, amlodipine, amlodipine-benazepril, cyclobenzaprine, esomeprazole, famotidine, fluticasone, gabapentin,  hydrochlorothiazide, hydroxyzine, levocetirizine, meclizine, melatonin, montelukast, naproxen, ondansetron, promethazine, robitussin cough+chest cong dm, sertraline, simvastatin, sucralfate, temazepam, tramadol, vitamin b-12, aspirin, and famotidine, and the following Facility-Administered Medications: dexamethasone, lidocaine, methylprednisolone acetate, and ropivacaine (pf) 2 mg/ml (0.2%). Her primarily concern today is the Knee Pain (bilateral) and Elbow Pain (right)  Initial Vital Signs:  Pulse/HCG Rate: 88  Temp: 97.9 F (36.6 C) Resp: 18 BP: (!) 151/67 SpO2: 99 %  BMI: Estimated body mass index is 26.17 kg/m as calculated from the following:   Height as of this encounter: 5' (1.524 m).   Weight as of this encounter: 134 lb (60.8 kg).  Risk Assessment: Allergies: Reviewed. She is allergic to trazodone, acetaminophen-codeine, codeine, oxycontin [oxycodone hcl], trazodone and nefazodone, vicodin [hydrocodone-acetaminophen], zolpidem, hydrocodone-acetaminophen, and oxycodone-acetaminophen.  Allergy Precautions: None required Coagulopathies: Reviewed. None identified.  Blood-thinner therapy: None at this time Active Infection(s): Reviewed. None identified. Sandra [name] is afebrile  Site Confirmation: Sandra [name] was asked to confirm the procedure and laterality before marking the site Procedure checklist: Completed Consent: Before the procedure and under the influence of no sedative(s), amnesic(s), or anxiolytics, the patient was informed of the treatment options, risks and possible complications. To fulfill our ethical and legal obligations, as recommended by the American Medical Association's Code of Ethics, I have informed the patient of my clinical impression; the nature and purpose of the treatment or procedure; the risks, benefits, and possible complications of the intervention; the alternatives, including doing nothing; the risk(s) and benefit(s) of the alternative treatment(s) or  procedure(s); and the risk(s) and benefit(s) of doing nothing. The patient was provided information about the general risks and possible complications associated with the procedure. These may include, but are not limited to: failure to achieve desired goals, infection, bleeding, organ or nerve damage, allergic reactions, paralysis, and death. In addition, the patient was informed of those risks and complications associated to the procedure, such as failure to decrease pain; infection; bleeding; organ or nerve  damage with subsequent damage to sensory, motor, and/or autonomic systems, resulting in permanent pain, numbness, and/or weakness of one or several areas of the body; allergic reactions; (i.e.: anaphylactic reaction); and/or death. Furthermore, the patient was informed of those risks and complications associated with the medications. These include, but are not limited to: allergic reactions (i.e.: anaphylactic or anaphylactoid reaction(s)); adrenal axis suppression; blood sugar elevation that in diabetics may result in ketoacidosis or comma; water retention that in patients with history of congestive heart failure may result in shortness of breath, pulmonary edema, and decompensation with resultant heart failure; weight gain; swelling or edema; medication-induced neural toxicity; particulate matter embolism and blood vessel occlusion with resultant organ, and/or nervous system infarction; and/or aseptic necrosis of one or more joints. Finally, the patient was informed that Medicine is not an exact science; therefore, there is also the possibility of unforeseen or unpredictable risks and/or possible complications that may result in a catastrophic outcome. The patient indicated having understood very clearly. We have given the patient no guarantees and we have made no promises. Enough time was given to the patient to ask questions, all of which were answered to the patient's satisfaction. Sandra [name] has  indicated that she wanted to continue with the procedure. Attestation: I, the ordering provider, attest that I have discussed with the patient the benefits, risks, side-effects, alternatives, likelihood of achieving goals, and potential problems during recovery for the procedure that I have provided informed consent. Date  Time: 03/01/2020 12:59 PM  Pre-Procedure Preparation:  Monitoring: As per clinic protocol. Respiration, ETCO2, SpO2, BP, heart rate and rhythm monitor placed and checked for adequate function Safety Precautions: Patient was assessed for positional comfort and pressure points before starting the procedure. Time-out: I initiated and conducted the "Time-out" before starting the procedure, as per protocol. The patient was asked to participate by confirming the accuracy of the "Time Out" information. Verification of the correct person, site, and procedure were performed and confirmed by me, the nursing staff, and the patient. "Time-out" conducted as per Joint Commission's Universal Protocol (UP.01.01.01). Time: 1345  Description of Procedure:          Target Area: Lateral epicondyle Approach: Lateral approach. Area Prepped: Entire elbow Region Prepping solution: DuraPrep (Iodine Povacrylex [0.7% available iodine] and Isopropyl Alcohol, 74% w/w) Safety Precautions: Aspiration looking for blood return was conducted prior to all injections. At no point did we inject any substances, as a needle was being advanced. No attempts were made at seeking any paresthesias. Safe injection practices and needle disposal techniques used. Medications properly checked for expiration dates. SDV (single dose vial) medications used. Description of the Procedure: Protocol guidelines were followed. The patient was placed in position. The target area was identified and the area prepped in the usual manner. Skin & deeper tissues infiltrated with local anesthetic. Appropriate amount of time allowed to pass for  local anesthetics to take effect. The procedure needles were then advanced to the target area. Proper needle placement secured. Negative aspiration confirmed. Solution injected in intermittent fashion, asking for systemic symptoms every 0.5cc of injectate. The needles were then removed and the area cleansed, making sure to leave some of the prepping solution back to take advantage of its long term bactericidal properties. Vitals:   03/01/20 1313  BP: (!) 151/67  Pulse: 88  Resp: 18  Temp: 97.9 F (36.6 C)  TempSrc: Temporal  SpO2: 99%  Weight: 134 lb (60.8 kg)  Height: 5' (1.524 m)    Start Time: [phone]  hrs. End Time: 1352 hrs. Materials:  Needle(s) Type: Regular needle Gauge: 25G Length: 1.5-in Medication(s): Please see orders for medications and dosing details. 1.5 cc solution consisting of 0.5 cc of 0.2% ropivacaine, 1 cc of Decadron, 10 mg/cc..  Injected at the lateral epicondyle for the right elbow.   Post-operative Assessment:  Post-procedure Vital Signs:  Pulse/HCG Rate: 88  Temp: 97.9 F (36.6 C) Resp: 18 BP: (!) 151/67 SpO2: 99 %  EBL: None  Complications: No immediate post-treatment complications observed by team, or reported by patient.  Note: The patient tolerated the entire procedure well. A repeat set of vitals were taken after the procedure and the patient was kept under observation following institutional policy, for this type of procedure. Post-procedural neurological assessment was performed, showing return to baseline, prior to discharge. The patient was provided with post-procedure discharge instructions, including a section on how to identify potential problems. Should any problems arise concerning this procedure, the patient was given instructions to immediately contact us, at any time, without hesitation. In any case, we plan to contact the patient by telephone for a follow-up status report regarding this interventional procedure.  Comments:  No additional  relevant information.  Plan of Care  Orders:  Orders Placed This Encounter  Procedures  . [facility] C-ARM 1-60 MIN NO REPORT    Intraoperative interpretation by procedural physician at [place].    Standing Status:   Standing    Number of Occurrences:   1    Order Specific Question:   Reason for exam:    Answer:   Assistance in needle guidance and placement for procedures requiring needle placement in or near specific anatomical locations not easily accessible without such assistance.   Medications ordered for procedure: Meds ordered this encounter  Medications  . lidocaine (XYLOCAINE) 2 % (with pres) injection 400 mg  . ropivacaine (PF) 2 mg/mL (0.2%) (NAROPIN) injection 9 mL  . methylPREDNISolone acetate (DEPO-MEDROL) injection 80 mg  . dexamethasone (DECADRON) injection 10 mg   Medications administered: Teddi Bier had no medications administered during this visit.  See the medical record for exact dosing, route, and time of administration.  Follow-up plan:   Return for Keep sch. appt.     Recent Visits Date Type Provider Dept  01/04/20 Office Visit Gillis Santa, MD [facility]  Showing recent visits within past 90 days and meeting all other requirements Today's Visits Date Type Provider Dept  03/01/20 Procedure visit Gillis Santa, MD [facility]  Showing today's visits and meeting all other requirements Future Appointments Date Type Provider Dept  03/29/20 Appointment Gillis Santa, MD [facility]  Showing future appointments within next 90 days and meeting all other requirements  Disposition: Discharge home  Discharge (Date  Time): 03/01/2020; 1354 hrs.   Primary Care Physician: Baxter Hire, MD Location: [facility] Note by: Gillis Santa, MD Date: 03/01/2020; Time: 1:59 PM  Disclaimer:  Medicine is not an exact science. The only guarantee in medicine is that nothing is  guaranteed. It is important to note that the decision to proceed with this intervention was based on the information collected from the patient. The Data and conclusions were drawn from the patient's questionnaire, the interview, and the physical examination. Because the information was provided in large part by the patient, it cannot be guaranteed that it has not been purposely or unconsciously manipulated. Every effort has been made to obtain as much relevant data as possible for  this evaluation. It is important to note that the conclusions that lead to this procedure are derived in large part from the available data. Always take into account that the treatment will also be dependent on availability of resources and existing treatment guidelines, considered by other Pain Management Practitioners as being common knowledge and practice, at the time of the intervention. For Medico-Legal purposes, it is also important to point out that variation in procedural techniques and pharmacological choices are the acceptable norm. The indications, contraindications, technique, and results of the above procedure should only be interpreted and judged by a Board-Certified Interventional Pain Specialist with extensive familiarity and expertise in the same exact procedure and technique.

## 2020-03-02 ENCOUNTER — Telehealth: Payer: Self-pay | Admitting: *Deleted

## 2020-03-02 NOTE — Telephone Encounter (Signed)
No problems post procedure. 

## 2020-03-29 ENCOUNTER — Encounter: Payer: Self-pay | Admitting: Student in an Organized Health Care Education/Training Program

## 2020-03-29 ENCOUNTER — Other Ambulatory Visit: Payer: Self-pay

## 2020-03-29 ENCOUNTER — Ambulatory Visit
Payer: PPO | Attending: Student in an Organized Health Care Education/Training Program | Admitting: Student in an Organized Health Care Education/Training Program

## 2020-03-29 VITALS — BP 144/75 | HR 76 | Temp 97.5°F | Resp 18 | Ht 61.0 in | Wt 128.0 lb

## 2020-03-29 DIAGNOSIS — M170 Bilateral primary osteoarthritis of knee: Secondary | ICD-10-CM | POA: Diagnosis not present

## 2020-03-29 DIAGNOSIS — M19011 Primary osteoarthritis, right shoulder: Secondary | ICD-10-CM | POA: Diagnosis not present

## 2020-03-29 DIAGNOSIS — M19021 Primary osteoarthritis, right elbow: Secondary | ICD-10-CM | POA: Diagnosis not present

## 2020-03-29 DIAGNOSIS — M5416 Radiculopathy, lumbar region: Secondary | ICD-10-CM | POA: Diagnosis not present

## 2020-03-29 DIAGNOSIS — G894 Chronic pain syndrome: Secondary | ICD-10-CM | POA: Insufficient documentation

## 2020-03-29 DIAGNOSIS — M79604 Pain in right leg: Secondary | ICD-10-CM | POA: Diagnosis not present

## 2020-03-29 DIAGNOSIS — M47816 Spondylosis without myelopathy or radiculopathy, lumbar region: Secondary | ICD-10-CM | POA: Diagnosis not present

## 2020-03-29 DIAGNOSIS — H5316 Psychophysical visual disturbances: Secondary | ICD-10-CM | POA: Insufficient documentation

## 2020-03-29 DIAGNOSIS — M79605 Pain in left leg: Secondary | ICD-10-CM | POA: Insufficient documentation

## 2020-03-29 DIAGNOSIS — G8929 Other chronic pain: Secondary | ICD-10-CM | POA: Insufficient documentation

## 2020-03-29 MED ORDER — HYDROCODONE-ACETAMINOPHEN 5-325 MG PO TABS: 1.0000 | ORAL_TABLET | Freq: Four times a day (QID) | ORAL | 0 refills | Status: DC | PRN

## 2020-03-29 NOTE — Progress Notes (Signed)
Nursing Pain Medication Assessment:  Safety precautions to be maintained throughout the outpatient stay will include: orient to surroundings, keep bed in low position, maintain call bell within reach at all times, provide assistance with transfer out of bed and ambulation.  Medication Inspection Compliance: Pill count conducted under aseptic conditions, in front of the patient. Neither the pills nor the bottle was removed from the patient's sight at any time. Once count was completed pills were immediately returned to the patient in their original bottle.  Medication: Tramadol (Ultram) Pill/Patch Count: 57 of 120 pills remain Pill/Patch Appearance: Markings consistent with prescribed medication Bottle Appearance: Standard pharmacy container. Clearly labeled. Filled Date: 01 / 18 / 2022 Last Medication intake:  Yesterday

## 2020-03-29 NOTE — Progress Notes (Signed)
PROVIDER NOTE: Information contained herein reflects review and annotations entered in association with encounter. Interpretation of such information and data should be left to medically-trained personnel. Information provided to patient can be located elsewhere in the medical record under "Patient Instructions". Document created using STT-dictation technology, any transcriptional errors that may result from process are unintentional.    Patient: Sandra [name]  Service Category: E/M  Provider: Gillis Santa, MD  DOB: 08-16-1955  DOS: 03/29/2020  Specialty: Interventional Pain Management  MRN: [id number]  Setting: Ambulatory outpatient  PCP: Baxter Hire, MD  Type: Established Patient    Referring Provider: Baxter Hire, MD  Location: Office  Delivery: Face-to-face     HPI  [name], a 65 y.o. year old female, is here today because of her Bilateral primary osteoarthritis of knee [M17.0]. [name]'s primary complain today is Back Pain (low) and Rectal Pain Last encounter: My last encounter with her was on 03/01/2020. Pertinent problems: [name] has Long term current use of opiate analgesic; Long term prescription opiate use; Opiate use; Hallucinations, visual; Psychiatric disorder; Chronic low back pain (Location of Primary Source of Pain) (Bilateral) (R>L); Chronic bilateral knee pain; Opiate dependence ([place]); Lumbar facet arthropathy; Lumbar spondylosis; Lumbar facet syndrome (Bilateral) (R>L); Diffuse myofascial pain syndrome; [name] (CBS); Arthropathy of right elbow; and Localized osteoarthritis of right shoulder on their pertinent problem list. Pain Assessment: Severity of Chronic pain is reported as a 8 /10. Location: Back Lower/hips and legs. Onset: More than a month ago. Quality: Aching,Throbbing,Constant,Burning,Pins and needles. Timing: Constant. Modifying factor(s): medications, rest. Vitals:  height is _0  (1.549 m) and weight is 128 lb (58.1 kg). Her  temporal temperature is 97.5 F (36.4 C) (abnormal). Her blood pressure is 144/75 (abnormal) and her pulse is 76. Her respiration is 18 and oxygen saturation is 99%.   Reason for encounter: both, medication management and post-procedure assessment.     Sandra [name] follows up today for medication management and postprocedural evaluation.  She is status post bilateral knee intra-articular steroid injection #2 as well as elbow injection on the right on 03/01/2020 which he states provided her with no benefit.  She states that she continues to be in pretty severe pain.  She states that her tramadol is not effective any longer and she even took extra for a couple of days after her injection which did not provide her with any additional analgesic benefit.  She has been on tramadol for over 13 years.  We discussed opioid rotation to hydrocodone, equal MME.  Risks and benefits reviewed and patient would like to proceed.   Pharmacotherapy Assessment   Analgesic: Transition from tramadol 50 mg every 6 hours as needed to hydrocodone 5 mg every 6 hours as needed.  Same MME dose, MME: 20 Monitoring: [place] PMP: PDMP not reviewed this encounter.       Pharmacotherapy: No side-effects or adverse reactions reported. Compliance: No problems identified. Effectiveness: Clinically acceptable.  Hart Rochester, RN  03/29/2020  1:44 PM  Sign when Signing Visit Nursing Pain Medication Assessment:  Safety precautions to be maintained throughout the outpatient stay will include: orient to surroundings, keep bed in low position, maintain call bell within reach at all times, provide assistance with transfer out of bed and ambulation.  Medication Inspection Compliance: Pill count conducted under aseptic conditions, in front of the patient. Neither the pills nor the bottle was removed from the patient's sight at any time. Once count was completed pills were immediately returned  to the patient in their original bottle.  Medication:  Tramadol (Ultram) Pill/Patch Count: 57 of 120 pills remain Pill/Patch Appearance: Markings consistent with prescribed medication Bottle Appearance: Standard pharmacy container. Clearly labeled. Filled Date: 01 / 18 / 2022 Last Medication intake:  Yesterday    UDS:  Summary  Date Value Ref Range Status  01/04/2020 Note  Final    Comment:    ==================================================================== [employer] Select 13 (MW) ==================================================================== Test                             Result       Flag       Units  Drug Present and Declared for Prescription Verification   Tramadol                       >1404        EXPECTED   ng/mg creat   O-Desmethyltramadol            >1404        EXPECTED   ng/mg creat   N-Desmethyltramadol            >1404        EXPECTED   ng/mg creat    Source of tramadol is a prescription medication. O-desmethyltramadol    and N-desmethyltramadol are expected metabolites of tramadol.  Drug Absent but Declared for Prescription Verification   Temazepam                      Not Detected UNEXPECTED ng/mg creat ==================================================================== Test                      Result    Flag   Units      Ref Range   Creatinine              356              mg/dL      >=20 ==================================================================== Declared Medications:  The flagging and interpretation on this report are based on the  following declared medications.  Unexpected results may arise from  inaccuracies in the declared medications.   **Note: The testing scope of this panel includes these medications:   Temazepam  Tramadol   **Note: The testing scope of this panel does not include the  following reported medications:   Acetaminophen  Albuterol  Amitriptyline  Amlodipine (Lotrel)  Amlodipine  Aspirin  Benazepril (Lotrel)  Cyanocobalamin  Cyclobenzaprine   Dextromethorphan  Esomeprazole (Nexium)  Famotidine  Fluticasone  Gabapentin  Guaifenesin  Hydrochlorothiazide  Hydroxyzine  Levocetirizine (Xyzal)  Meclizine  Melatonin  Montelukast  Naproxen  Ondansetron  Promethazine  Sertraline  Simvastatin  Sucralfate ==================================================================== For clinical consultation, please call [phone]. ====================================================================      ROS  Constitutional: Denies any fever or chills Gastrointestinal: No reported hemesis, hematochezia, vomiting, or acute GI distress Musculoskeletal: Low back, right knee, right elbow pain Neurological: No reported episodes of acute onset apraxia, aphasia, dysarthria, agnosia, amnesia, paralysis, loss of coordination, or loss of consciousness  Medication Review  Dextromethorphan-guaiFENesin, HYDROcodone-acetaminophen, Melatonin, acetaminophen, albuterol, amLODipine, amitriptyline, amlodipine-benazepril, aspirin, cyclobenzaprine, esomeprazole, famotidine, fluticasone, gabapentin, hydrOXYzine, hydrochlorothiazide, levocetirizine, meclizine, montelukast, naproxen, ondansetron, promethazine, sertraline, simvastatin, sucralfate, temazepam, traMADol, and vitamin B-12  History Review  Allergy: [name] is allergic to trazodone, acetaminophen-codeine, codeine, oxycontin [oxycodone hcl], trazodone and nefazodone, vicodin [hydrocodone-acetaminophen], zolpidem, hydrocodone-acetaminophen, and oxycodone-acetaminophen. Drug: [name]  reports no history of drug use. Alcohol:  reports no history of alcohol use. Tobacco:  reports that she quit smoking about 46 years ago. She has never used smokeless tobacco. Social: [name]  reports that she quit smoking about 46 years ago. She has never used smokeless tobacco. She reports that she does not drink alcohol and does not use drugs. Medical:  has a past medical history of Anemia, Anxiety  (08/01/2014), Arthritis, degenerative (07/30/2013), Clinical depression (06/30/2014), Depression, Elevated liver enzymes, GERD (gastroesophageal reflux disease), H/O breast biopsy (01/05/2015), H/O: attempted suicide (2013), Hepatitis B, Hiatal hernia, History of hysterectomy (01/05/2015), History of peptic ulcer disease (01/05/2015), Hypercholesteremia, Hypertension, Legally blind, Panic attacks, PUD (peptic ulcer disease), Rectocele (01/05/2015), Retinitis pigmentosa, and S/P cholecystectomy (01/05/2015). Surgical: [name]  has a past surgical history that includes Cholecystectomy; Rectocele repair; Abdominal hysterectomy; Appendectomy; Colonoscopy; Esophagogastroduodenoscopy; Sphincterotomy (N/A, 01/24/2017); Hemorrhoid surgery (N/A, 03/07/2017); Hernia repair; Esophagogastroduodenoscopy (egd) with propofol (N/A, 09/17/2017); Oophorectomy; and Breast biopsy (Bilateral, 1977). Family: family history includes Breast cancer in her sister; Cancer in her father; Dementia in her mother; Diabetes in her mother; Hypertension in her mother; Prostate cancer in her father.  Laboratory Chemistry Profile   Renal Lab Results  Component Value Date   BUN 18 01/16/2017   CREATININE 0.75 01/16/2017   GFRAA >60 01/16/2017   GFRNONAA >60 01/16/2017     Hepatic Lab Results  Component Value Date   AST 24 05/13/2015   ALT 13 (L) 05/13/2015   ALBUMIN 4.5 05/13/2015   ALKPHOS 75 05/13/2015     Electrolytes Lab Results  Component Value Date   NA 138 01/16/2017   K 4.2 01/16/2017   CL 101 01/16/2017   CALCIUM 9.4 01/16/2017   MG 2.0 02/07/2015     Bone Lab Results  Component Value Date   25OHVITD1 19 (L) 02/07/2015   25OHVITD2 3.9 02/07/2015   25OHVITD3 15 02/07/2015     Inflammation (CRP: Acute Phase) (ESR: Chronic Phase) Lab Results  Component Value Date   CRP <0.5 02/07/2015   ESRSEDRATE 26 02/07/2015       Note: Above Lab results reviewed.   Physical Exam  General appearance: Well  nourished, well developed, and well hydrated. In no apparent acute distress Mental status: Alert, oriented x 3 (person, place, & time)       Respiratory: No evidence of acute respiratory distress Eyes: PERLA Vitals: BP (!) 144/75   Pulse 76   Temp (!) 97.5 F (36.4 C) (Temporal)   Resp 18   Ht _0  (1.549 m)   Wt 128 lb (58.1 kg)   SpO2 99%   BMI 24.19 kg/m  BMI: Estimated body mass index is 24.19 kg/m as calculated from the following:   Height as of this encounter: _1  (1.549 m).   Weight as of this encounter: 128 lb (58.1 kg). Ideal: Ideal body weight: 47.8 kg (105 lb 6.1 oz) Adjusted ideal body weight: 51.9 kg (114 lb 6.8 oz)  Lumbar Spine Area Exam  Skin & Axial Inspection: No masses, redness, or swelling Alignment: Symmetrical Functional ROM: Pain restricted ROM       Stability: No instability detected Muscle Tone/Strength: Functionally intact. No obvious neuro-muscular anomalies detected. Sensory (Neurological): Musculoskeletal pain pattern  Gait & Posture Assessment  Ambulation: Patient came in today in a wheel chair Gait: Antalgic gait (limping) Posture: Difficulty standing up straight, due to pain  Lower Extremity Exam    Side: Right lower extremity  Side: Left lower extremity  Stability: No instability observed          Stability: No instability observed          Skin & Extremity Inspection: Skin color, temperature, and hair growth are WNL. No peripheral edema or cyanosis. No masses, redness, swelling, asymmetry, or associated skin lesions. No contractures.  Skin & Extremity Inspection: Skin color, temperature, and hair growth are WNL. No peripheral edema or cyanosis. No masses, redness, swelling, asymmetry, or associated skin lesions. No contractures.  Functional ROM: Pain restricted ROM for hip and knee joints          Functional ROM: Pain restricted ROM for hip and knee joints          Muscle Tone/Strength: Functionally intact. No obvious neuro-muscular  anomalies detected.  Muscle Tone/Strength: Functionally intact. No obvious neuro-muscular anomalies detected.  Sensory (Neurological): Musculoskeletal pain pattern        Sensory (Neurological): Musculoskeletal pain pattern        DTR: Patellar: deferred today Achilles: deferred today Plantar: deferred today  DTR: Patellar: deferred today Achilles: deferred today Plantar: deferred today  Palpation: No palpable anomalies  Palpation: No palpable anomalies     Assessment   Status Diagnosis  Having a Flare-up Having a Flare-up Having a Flare-up 1. Bilateral primary osteoarthritis of knee   2. Localized osteoarthritis of right shoulder   3. Lumbar facet syndrome (Bilateral) (R>L)   4. Lumbar facet arthropathy   5. Chronic radicular lumbar pain   6. [name] (CBS)   7. Chronic lower extremity pain (Bilateral)   8. Arthropathy of right elbow   9. Chronic pain syndrome       Plan of Care  [name] has a current medication list which includes the following long-term medication(s): albuterol, amitriptyline, cyclobenzaprine, esomeprazole, famotidine, fluticasone, gabapentin, hydrochlorothiazide, levocetirizine, montelukast, promethazine, simvastatin, sucralfate, and famotidine.  Pharmacotherapy (Medications Ordered): Meds ordered this encounter  Medications  . HYDROcodone-acetaminophen (NORCO/VICODIN) 5-325 MG tablet    Sig: Take 1 tablet by mouth every 6 (six) hours as needed for severe pain. Must last 30 days.    Dispense:  120 tablet    Refill:  0    Chronic Pain. (STOP Act - Not applicable). Fill one day early if closed on scheduled refill date.    Follow-up plan:   Return in about 4 weeks (around 04/26/2020) for Medication Management, in person.     Status post bilateral intra-articular knee steroid, right elbow injection on 04/05/2019, 03/01/20    Recent Visits Date Type Provider Dept  03/01/20 Procedure visit Gillis Santa, MD [facility]   01/04/20 Office Visit Gillis Santa, MD [facility]  Showing recent visits within past 90 days and meeting all other requirements Today's Visits Date Type Provider Dept  03/29/20 Office Visit Gillis Santa, MD [facility]  Showing today's visits and meeting all other requirements Future Appointments Date Type Provider Dept  04/26/20 Appointment Gillis Santa, MD [facility]  Showing future appointments within next 90 days and meeting all other requirements  I discussed the assessment and treatment plan with the patient. The patient was provided an opportunity to ask questions and all were answered. The patient agreed with the plan and demonstrated an understanding of the instructions.  Patient advised to call back or seek an in-person evaluation if the symptoms or condition worsens.  Duration of encounter: 30 minutes.  Note by: Gillis Santa, MD Date: 03/29/2020; Time: 2:13 PM

## 2020-04-04 ENCOUNTER — Encounter: Payer: PPO | Admitting: Student in an Organized Health Care Education/Training Program

## 2020-04-14 ENCOUNTER — Other Ambulatory Visit: Payer: Self-pay

## 2020-04-14 ENCOUNTER — Emergency Department
Admission: EM | Admit: 2020-04-14 | Discharge: 2020-04-15 | Disposition: A | Discharge status: Home / Self Care | Payer: PPO | Attending: Emergency Medicine | Admitting: Emergency Medicine

## 2020-04-14 ENCOUNTER — Encounter: Payer: Self-pay | Admitting: Physician Assistant

## 2020-04-14 DIAGNOSIS — R52 Pain, unspecified: Secondary | ICD-10-CM | POA: Diagnosis not present

## 2020-04-14 DIAGNOSIS — Z87891 Personal history of nicotine dependence: Secondary | ICD-10-CM | POA: Diagnosis not present

## 2020-04-14 DIAGNOSIS — Z7982 Long term (current) use of aspirin: Secondary | ICD-10-CM | POA: Insufficient documentation

## 2020-04-14 DIAGNOSIS — Z79899 Other long term (current) drug therapy: Secondary | ICD-10-CM | POA: Insufficient documentation

## 2020-04-14 DIAGNOSIS — I10 Essential (primary) hypertension: Secondary | ICD-10-CM | POA: Diagnosis not present

## 2020-04-14 DIAGNOSIS — R0902 Hypoxemia: Secondary | ICD-10-CM | POA: Diagnosis not present

## 2020-04-14 DIAGNOSIS — G8929 Other chronic pain: Secondary | ICD-10-CM | POA: Diagnosis not present

## 2020-04-14 DIAGNOSIS — M545 Low back pain, unspecified: Secondary | ICD-10-CM | POA: Diagnosis not present

## 2020-04-14 DIAGNOSIS — M5459 Other low back pain: Secondary | ICD-10-CM | POA: Diagnosis not present

## 2020-04-14 DIAGNOSIS — M549 Dorsalgia, unspecified: Secondary | ICD-10-CM | POA: Diagnosis not present

## 2020-04-14 DIAGNOSIS — R5381 Other malaise: Secondary | ICD-10-CM | POA: Diagnosis not present

## 2020-04-14 LAB — URINALYSIS, COMPLETE (UACMP) WITH MICROSCOPIC
Bacteria, UA: NONE SEEN
Bilirubin Urine: NEGATIVE
Glucose, UA: NEGATIVE mg/dL
Hgb urine dipstick: NEGATIVE
Ketones, ur: NEGATIVE mg/dL
Leukocytes,Ua: NEGATIVE
Nitrite: NEGATIVE
Protein, ur: NEGATIVE mg/dL
Specific Gravity, Urine: 1.011 (ref 1.005–1.030)
Squamous Epithelial / HPF: NONE SEEN (ref 0–5)
pH: 6 (ref 5.0–8.0)

## 2020-04-14 MED ORDER — KETOROLAC TROMETHAMINE 30 MG/ML IJ SOLN
30.0000 mg | Freq: Once | INTRAMUSCULAR | Status: AC
  Administered 2020-04-14: 30 mg via INTRAMUSCULAR
  Filled 2020-04-14: qty 1

## 2020-04-14 MED ORDER — ORPHENADRINE CITRATE 30 MG/ML IJ SOLN
60.0000 mg | INTRAMUSCULAR | Status: AC
  Administered 2020-04-14: 60 mg via INTRAMUSCULAR
  Filled 2020-04-14: qty 2

## 2020-04-14 MED ORDER — LIDOCAINE 5 % EX PTCH
1.0000 | MEDICATED_PATCH | Freq: Once | CUTANEOUS | Status: DC
  Administered 2020-04-14: 1 via TRANSDERMAL
  Filled 2020-04-14: qty 1
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
## 2020-04-14 NOTE — ED Triage Notes (Addendum)
Pt with right mid to lower  back pain that began this pm that began when she attempted to get up to answer a telephone. Pt states pain is severe. Pain was sudden on set on attempting to ambulate. Pt took 10mg  flexeril and gabapentin pta. Pt denies nausea.

## 2020-04-15 MED ORDER — LIDOCAINE 5 % EX PTCH: 1.0000 | MEDICATED_PATCH | Freq: Two times a day (BID) | CUTANEOUS | 0 refills | Status: AC | PRN

## 2020-04-15 MED ORDER — ONDANSETRON 4 MG PO TBDP
4.0000 mg | ORAL_TABLET | Freq: Once | ORAL | Status: AC
  Administered 2020-04-15: 4 mg via ORAL
  Filled 2020-04-15: qty 1

## 2020-04-15 MED ORDER — OXYCODONE-ACETAMINOPHEN 5-325 MG PO TABS
1.0000 | ORAL_TABLET | Freq: Once | ORAL | Status: AC
  Administered 2020-04-15: 1 via ORAL
  Filled 2020-04-15: qty 1

## 2020-04-15 NOTE — Discharge Instructions (Signed)
Take your home medicines as prescribed.  You may use the Lidoderm patches as directed.

## 2020-04-26 ENCOUNTER — Ambulatory Visit
Payer: PPO | Attending: Student in an Organized Health Care Education/Training Program | Admitting: Student in an Organized Health Care Education/Training Program

## 2020-04-26 ENCOUNTER — Other Ambulatory Visit: Payer: Self-pay

## 2020-04-26 ENCOUNTER — Encounter: Payer: Self-pay | Admitting: Student in an Organized Health Care Education/Training Program

## 2020-04-26 DIAGNOSIS — M19011 Primary osteoarthritis, right shoulder: Secondary | ICD-10-CM | POA: Diagnosis not present

## 2020-04-26 DIAGNOSIS — G8929 Other chronic pain: Secondary | ICD-10-CM | POA: Diagnosis not present

## 2020-04-26 DIAGNOSIS — M170 Bilateral primary osteoarthritis of knee: Secondary | ICD-10-CM | POA: Diagnosis not present

## 2020-04-26 DIAGNOSIS — G894 Chronic pain syndrome: Secondary | ICD-10-CM

## 2020-04-26 DIAGNOSIS — M5416 Radiculopathy, lumbar region: Secondary | ICD-10-CM

## 2020-04-26 MED ORDER — TRAMADOL HCL ER 100 MG PO TB24: 100.0000 mg | ORAL_TABLET | Freq: Every day | ORAL | 1 refills | Status: DC

## 2020-04-26 MED ORDER — HYDROCODONE-ACETAMINOPHEN 5-325 MG PO TABS: 1.0000 | ORAL_TABLET | Freq: Four times a day (QID) | ORAL | 0 refills | Status: AC | PRN

## 2020-04-26 MED ORDER — HYDROCODONE-ACETAMINOPHEN 5-325 MG PO TABS: 1.0000 | ORAL_TABLET | Freq: Four times a day (QID) | ORAL | 0 refills | Status: DC | PRN

## 2020-04-26 NOTE — Progress Notes (Signed)
PROVIDER NOTE: Information contained herein reflects review and annotations entered in association with encounter. Interpretation of such information and data should be left to medically-trained personnel. Information provided to patient can be located elsewhere in the medical record under "Patient Instructions". Document created using STT-dictation technology, any transcriptional errors that may result from process are unintentional.    Patient: Sandra [name]  Service Category: E/M  Provider: Gillis Santa, MD  DOB: 09-Nov-1955  DOS: 04/26/2020  Specialty: Interventional Pain Management  MRN: [id number]  Setting: Ambulatory outpatient  PCP: Baxter Hire, MD  Type: Established Patient    Referring Provider: Baxter Hire, MD  Location: Office  Delivery: Face-to-face     HPI  Sandra [name], a 65 y.o. year old female, is here today because of her No primary diagnosis found.. Sandra [name]'s primary complain today is Back Pain (low) Last encounter: My last encounter with her was on 03/29/2020. Pertinent problems: [name] has Long term current use of opiate analgesic; Long term prescription opiate use; Opiate use; Hallucinations, visual; Psychiatric disorder; Chronic low back pain (Location of Primary Source of Pain) (Bilateral) (R>L); Chronic bilateral knee pain; Opiate dependence ([place]); Lumbar facet arthropathy; Lumbar spondylosis; Lumbar facet syndrome (Bilateral) (R>L); Diffuse myofascial pain syndrome; [name] (CBS); Arthropathy of right elbow; and Localized osteoarthritis of right shoulder on their pertinent problem list. Pain Assessment: Severity of Chronic pain is reported as a 4 /10. Location: Back Lower/radiates into coccyx. Onset: More than a month ago. Quality: Burning. Timing: Intermittent. Modifying factor(s): medications. Vitals:  height is 5' 1" (1.549 m) and weight is 128 lb (58.1 kg). Her temperature is 99.1 F (37.3 C). Her blood pressure is 157/75 (abnormal)  and her pulse is 91. Her respiration is 18 and oxygen saturation is 97%.   Reason for encounter: medication management.    Patient continues to struggle with severe low back pain.  Of note, she presented to the emergency department on 04/14/2020 for increased acute on chronic low back pain that occurred after she stood up from a seated position.  No fall.  In the ED she received a Toradol injection and was discharged instructed to follow-up with her pain management provider.    Of note, she was previously transitioned from tramadol to hydrocodone which she states is providing slightly additional pain relief but not as much as she was expecting.  We have discussed adding on a long-acting opioid analgesic in the form of tramadol 100 mg daily for long-acting pain relief.  She was counseled on the risk of serotonin syndrome especially in the context of her being on Zoloft.  Should she have any symptoms concerning for serotonin syndrome I instructed her to discontinue her tramadol and let us know.  Pharmacotherapy Assessment   03/29/2020  03/29/2020   1  Hydrocodone-Acetamin 5-325 Mg  120.00  30  Bi Lat  [id number]  [place] ([id number])  0/0  20.00 MME  Medicare  [place]      Analgesic: Hydrocodone 5 mg every 6 hours as needed, quantity 120/month; MME equals 20.  Addition of tramadol 100 mg ER daily for long-acting pain relief given suboptimal pain relief on current regimen.    Monitoring: [place] PMP: PDMP reviewed during this encounter.       Pharmacotherapy: No side-effects or adverse reactions reported. Compliance: No problems identified. Effectiveness: Clinically acceptable.  Dewayne Shorter, RN  04/26/2020  1:09 PM  Signed Nursing Pain Medication Assessment:  Safety precautions to be maintained throughout the outpatient stay  will include: orient to surroundings, keep bed in low position, maintain call bell within reach at all times, provide assistance with transfer out of bed and ambulation.  Medication Inspection  Compliance: Pill count conducted under aseptic conditions, in front of the patient. Neither the pills nor the bottle was removed from the patient's sight at any time. Once count was completed pills were immediately returned to the patient in their original bottle.  Medication: Hydrocodone/APAP Pill/Patch Count: 1 of 120 pills remain Pill/Patch Appearance: Markings consistent with prescribed medication Bottle Appearance: Standard pharmacy container. Clearly labeled. Filled Date: 02 / 09/ 2022 Last Medication intake:  Today    UDS:  Summary  Date Value Ref Range Status  01/04/2020 Note  Final    Comment:    ==================================================================== [employer] 13 (MW) ==================================================================== Test                             Result       Flag       Units  Drug Present and Declared for Prescription Verification   Tramadol                       >1404        EXPECTED   ng/mg creat   O-Desmethyltramadol            >1404        EXPECTED   ng/mg creat   N-Desmethyltramadol            >1404        EXPECTED   ng/mg creat    Source of tramadol is a prescription medication. O-desmethyltramadol    and N-desmethyltramadol are expected metabolites of tramadol.  Drug Absent but Declared for Prescription Verification   Temazepam                      Not Detected UNEXPECTED ng/mg creat ==================================================================== Test                      Result    Flag   Units      Ref Range   Creatinine              356              mg/dL      >=20 ==================================================================== Declared Medications:  The flagging and interpretation on this report are based on the  following declared medications.  Unexpected results may arise from  inaccuracies in the declared medications.   **Note: The testing scope of this panel includes these medications:   Temazepam   Tramadol   **Note: The testing scope of this panel does not include the  following reported medications:   Acetaminophen  Albuterol  Amitriptyline  Amlodipine (Lotrel)  Amlodipine  Aspirin  Benazepril (Lotrel)  Cyanocobalamin  Cyclobenzaprine  Dextromethorphan  Esomeprazole (Nexium)  Famotidine  Fluticasone  Gabapentin  Guaifenesin  Hydrochlorothiazide  Hydroxyzine  Levocetirizine (Xyzal)  Meclizine  Melatonin  Montelukast  Naproxen  Ondansetron  Promethazine  Sertraline  Simvastatin  Sucralfate ==================================================================== For clinical consultation, please call [phone]. ====================================================================      ROS  Constitutional: Denies any fever or chills Gastrointestinal: No reported hemesis, hematochezia, vomiting, or acute GI distress Musculoskeletal: Low back pain Neurological: No reported episodes of acute onset apraxia, aphasia, dysarthria, agnosia, amnesia, paralysis, loss of coordination, or loss of consciousness  Medication Review  Dextromethorphan-guaiFENesin, HYDROcodone-acetaminophen, Melatonin, acetaminophen, albuterol, amLODipine, amitriptyline, amlodipine-benazepril, aspirin, cyclobenzaprine, esomeprazole, famotidine, fluticasone, gabapentin, hydrOXYzine, hydrochlorothiazide, levocetirizine, meclizine, montelukast, naproxen, ondansetron, promethazine, sertraline, simvastatin, sucralfate, temazepam, traMADol, and vitamin B-12  History Review  Allergy: Ms. Edelman is allergic to trazodone, acetaminophen-codeine, codeine, oxycontin [oxycodone hcl], trazodone and nefazodone, vicodin [hydrocodone-acetaminophen], zolpidem, hydrocodone-acetaminophen, and oxycodone-acetaminophen. Drug: Ms. Lady  reports no history of drug use. Alcohol:  reports no history of alcohol use. Tobacco:  reports that she quit smoking about 46 years ago. She has never used smokeless  tobacco. Social: Ms. Fiorenza  reports that she quit smoking about 46 years ago. She has never used smokeless tobacco. She reports that she does not drink alcohol and does not use drugs. Medical:  has a past medical history of Anemia, Anxiety (08/01/2014), Arthritis, degenerative (07/30/2013), Clinical depression (06/30/2014), Depression, Elevated liver enzymes, GERD (gastroesophageal reflux disease), H/O breast biopsy (01/05/2015), H/O: attempted suicide (2013), Hepatitis B, Hiatal hernia, History of hysterectomy (01/05/2015), History of peptic ulcer disease (01/05/2015), Hypercholesteremia, Hypertension, Legally blind, Panic attacks, PUD (peptic ulcer disease), Rectocele (01/05/2015), Retinitis pigmentosa, and S/P cholecystectomy (01/05/2015). Surgical: Ms. Clingan  has a past surgical history that includes Cholecystectomy; Rectocele repair; Abdominal hysterectomy; Appendectomy; Colonoscopy; Esophagogastroduodenoscopy; Sphincterotomy (N/A, 01/24/2017); Hemorrhoid surgery (N/A, 03/07/2017); Hernia repair; Esophagogastroduodenoscopy (egd) with propofol (N/A, 09/17/2017); Oophorectomy; and Breast biopsy (Bilateral, 1977). Family: family history includes Breast cancer in her sister; Cancer in her father; Dementia in her mother; Diabetes in her mother; Hypertension in her mother; Prostate cancer in her father.  Laboratory Chemistry Profile   Renal Lab Results  Component Value Date   BUN 18 01/16/2017   CREATININE 0.75 01/16/2017   GFRAA >60 01/16/2017   GFRNONAA >60 01/16/2017     Hepatic Lab Results  Component Value Date   AST 24 05/13/2015   ALT 13 (L) 05/13/2015   ALBUMIN 4.5 05/13/2015   ALKPHOS 75 05/13/2015     Electrolytes Lab Results  Component Value Date   NA 138 01/16/2017   K 4.2 01/16/2017   CL 101 01/16/2017   CALCIUM 9.4 01/16/2017   MG 2.0 02/07/2015     Bone Lab Results  Component Value Date   25OHVITD1 19 (L) 02/07/2015   25OHVITD2 3.9 02/07/2015   25OHVITD3 15  02/07/2015     Inflammation (CRP: Acute Phase) (ESR: Chronic Phase) Lab Results  Component Value Date   CRP <0.5 02/07/2015   ESRSEDRATE 26 02/07/2015       Note: Above Lab results reviewed.   Physical Exam  General appearance: Well nourished, well developed, and well hydrated. In no apparent acute distress Mental status: Alert, oriented x 3 (person, place, & time)       Respiratory: No evidence of acute respiratory distress Vitals: BP (!) 157/75   Pulse 91   Temp 99.1 F (37.3 C)   Resp 18   Ht 5' 1" (1.549 m)   Wt 128 lb (58.1 kg)   SpO2 97%   BMI 24.19 kg/m  BMI: Estimated body mass index is 24.19 kg/m as calculated from the following:   Height as of this encounter: 5' 1" (1.549 m).   Weight as of this encounter: 128 lb (58.1 kg). Ideal: Ideal body weight: 47.8 kg (105 lb 6.1 oz) Adjusted ideal body weight: 51.9 kg (114 lb 6.8 oz)   Lumbar Spine Area Exam  Skin & Axial Inspection: No masses, redness, or swelling Alignment: Symmetrical Functional ROM: Pain restricted ROM       Stability: No instability detected Muscle Tone/Strength:  Functionally intact. No obvious neuro-muscular anomalies detected. Sensory (Neurological): Musculoskeletal pain pattern  Gait & Posture Assessment  Ambulation: Patient came in today in a wheel chair Gait: Antalgic gait (limping) Posture: Difficulty standing up straight, due to pain  Lower Extremity Exam    Side: Right lower extremity  Side: Left lower extremity  Stability: No instability observed          Stability: No instability observed          Skin & Extremity Inspection: Skin color, temperature, and hair growth are WNL. No peripheral edema or cyanosis. No masses, redness, swelling, asymmetry, or associated skin lesions. No contractures.  Skin & Extremity Inspection: Skin color, temperature, and hair growth are WNL. No peripheral edema or cyanosis. No masses, redness, swelling, asymmetry, or associated skin lesions. No  contractures.  Functional ROM: Pain restricted ROM for hip and knee joints          Functional ROM: Pain restricted ROM for hip and knee joints          Muscle Tone/Strength: Functionally intact. No obvious neuro-muscular anomalies detected.  Muscle Tone/Strength: Functionally intact. No obvious neuro-muscular anomalies detected.  Sensory (Neurological): Musculoskeletal pain pattern        Sensory (Neurological): Musculoskeletal pain pattern        DTR: Patellar: deferred today Achilles: deferred today Plantar: deferred today  DTR: Patellar: deferred today Achilles: deferred today Plantar: deferred today  Palpation: No palpable anomalies  Palpation: No palpable anomalies     Assessment   Status Diagnosis  Worsening Worsening Persistent 1. Localized osteoarthritis of right shoulder   2. Chronic radicular lumbar pain   3. Bilateral primary osteoarthritis of knee   4. Chronic pain syndrome       Plan of Care  Ms. Iyanni Hepp has a current medication list which includes the following long-term medication(s): albuterol, amitriptyline, cyclobenzaprine, esomeprazole, famotidine, fluticasone, gabapentin, hydrochlorothiazide, levocetirizine, montelukast, promethazine, simvastatin, sucralfate, and famotidine.  Pharmacotherapy (Medications Ordered): Meds ordered this encounter  Medications  . traMADol (ULTRAM-ER) 100 MG 24 hr tablet    Sig: Take 1 tablet (100 mg total) by mouth daily.    Dispense:  30 tablet    Refill:  1    For chronic pain syndrome  . HYDROcodone-acetaminophen (NORCO/VICODIN) 5-325 MG tablet    Sig: Take 1 tablet by mouth every 6 (six) hours as needed for severe pain. Must last 30 days.    Dispense:  120 tablet    Refill:  0    Chronic Pain. (STOP Act - Not applicable). Fill one day early if closed on scheduled refill date.  Marland Kitchen HYDROcodone-acetaminophen (NORCO/VICODIN) 5-325 MG tablet    Sig: Take 1 tablet by mouth every 6 (six) hours as needed for  severe pain. Must last 30 days.    Dispense:  120 tablet    Refill:  0    Chronic Pain. (STOP Act - Not applicable). Fill one day early if closed on scheduled refill date.   Follow-up plan:   Return in about 8 weeks (around 06/21/2020) for Medication Management, in person.     Status post bilateral intra-articular knee steroid, right elbow injection on 04/05/2019, 03/01/20     Recent Visits Date Type Provider Dept  03/29/20 Office Visit Gillis Santa, MD [facility]  03/01/20 Procedure visit Gillis Santa, MD [facility]  Showing recent visits within past 90 days and meeting all other requirements Today's Visits Date Type Provider Dept  04/26/20 Office Visit Gillis Santa, MD  [facility]  Showing today's visits and meeting all other requirements Future Appointments Date Type Provider Dept  06/22/20 Appointment Gillis Santa, MD [facility]  Showing future appointments within next 90 days and meeting all other requirements  I discussed the assessment and treatment plan with the patient. The patient was provided an opportunity to ask questions and all were answered. The patient agreed with the plan and demonstrated an understanding of the instructions.  Patient advised to call back or seek an in-person evaluation if the symptoms or condition worsens.  Duration of encounter: 30 minutes.  Note by: Gillis Santa, MD Date: 04/26/2020; Time: 2:51 PM

## 2020-04-26 NOTE — Patient Instructions (Signed)
Tramadol extended release tablets or capsules What is this medicine? TRAMADOL (TRA ma dole) is a pain reliever. It is used to treat severe pain. This medicine may be used for other purposes; ask your health care provider or pharmacist if you have questions. COMMON BRAND NAME(S): ConZip, Ryzolt, Ultram ER What should I tell my health care provider before I take this medicine? They need to know if you have any of these conditions:  brain tumor  drug abuse or addiction  head injury  if you often drink alcohol  kidney disease  liver disease  lung disease, asthma, or breathing problems  seizures  stomach or intestine problems  suicidal thoughts, plans or attempt; a previous suicide attempt by you or a family member  taken an MAOI like Marplan, Nardil, or Parnate in the last 14 days  an unusual allergic reaction to tramadol, other medicines, foods, dyes, or preservative  pregnant or trying to get pregnant  breast-feeding How should I use this medicine? Take this medicine by mouth with a glass of water. Follow the directions on the prescription label. Do not cut, crush or chew this medicine. Take this medicine the same way each day, either with or without food. If it upsets your stomach, take it with food. Take your medicine at regular intervals. Do not take it more often than directed. Do not stop taking except on your doctor's advice. A special MedGuide will be given to you by the pharmacist with each prescription and refill. Be sure to read this information carefully each time. Talk to your pediatrician regarding the use of this medicine in children. Special care may be needed. Overdosage: If you think you have taken too much of this medicine contact a poison control center or emergency room at once. NOTE: This medicine is only for you. Do not share this medicine with others. What if I miss a dose? If you miss a dose, take it as soon as you can. If it is almost time for your  next dose, take only that dose. Do not take double or extra doses. What may interact with this medicine? Do not take this medicine with any of the following medications:  linezolid  MAOIs like Marplan, Nardil, and Parnate  methylene blue  ozanimod This medicine may also interact with the following medications:  alcohol  antihistamines for allergy, cough, and cold  atropine  certain antibiotics like erythromycin, clarithromycin, rifampin  certain antivirals for HIV or hepatitis  certain medicines for anxiety or sleep  certain medicines for bladder problems like oxybutynin, tolterodine  certain medicines for depression like amitriptyline, bupropion, fluoxetine, paroxetine, sertraline  certain medicines for fungal infections like ketoconazole, itraconazole, or posaconazole  certain medicines for migraine headache like almotriptan, eletriptan, frovatriptan, naratriptan, rizatriptan, sumatriptan, zolmitriptan  certain medicines for Parkinson's disease like benztropine, trihexyphenidyl  certain medicines for seizures like carbamazepine, phenobarbital, primidone  certain medicines for stomach problems like dicyclomine, hyoscyamine  certain medicines for travel sickness like scopolamine  digoxin  diuretics  general anesthetics like halothane, isoflurane, methoxyflurane, propofol  ipratropium  medicines that relax muscles for surgery  other narcotic medicines for pain  phenothiazines like chlorpromazine, mesoridazine, prochlorperazine, thioridazine  quinidine  warfarin This list may not describe all possible interactions. Give your health care provider a list of all the medicines, herbs, non-prescription drugs, or dietary supplements you use. Also tell them if you smoke, drink alcohol, or use illegal drugs. Some items may interact with your medicine. What should I watch for while using  this medicine? Tell your health care provider if your pain does not go away, if  it gets worse, or if you have new or a different type of pain. You may develop tolerance to this drug. Tolerance means that you will need a higher dose of the drug for pain relief. Tolerance is normal and is expected if you take this drug for a long time. Do not suddenly stop taking your drug because you may develop a severe reaction. Your body becomes used to the drug. This does NOT mean you are addicted. Addiction is a behavior related to getting and using a drug for a nonmedical reason. If you have pain, you have a medical reason to take pain drug. Your health care provider will tell you how much drug to take. If your health care provider wants you to stop the drug, the dose will be slowly lowered over time to avoid any side effects. If you take other drugs that also cause drowsiness like other narcotic pain drugs, benzodiazepines, or other drugs for sleep, you may have more side effects. Give your health care provider a list of all drugs you use. He or she will tell you how much drug to take. Do not take more drug than directed. Get emergency help right away if you have trouble breathing or are unusually tired or sleepy. Talk to your health care provider about naloxone and how to get it. Naloxone is an emergency drug used for an opioid overdose. An overdose can happen if you take too much opioid. It can also happen if an opioid is taken with some other drugs or substances, like alcohol. Know the symptoms of an overdose, like trouble breathing, unusually tired or sleepy, or not being able to respond or wake up. Make sure to tell caregivers and close contacts where it is stored. Make sure they know how to use it. After naloxone is given, you must get emergency help right away. Naloxone is a temporary treatment. Repeat doses may be needed. This drug may cause serious skin reactions. They can happen weeks to months after starting the drug. Contact your health care provider right away if you notice fevers or  flu-like symptoms with a rash. The rash may be red or purple and then turn into blisters or peeling of the skin. Or, you might notice a red rash with swelling of the face, lips or lymph nodes in your neck or under your arms. You may get drowsy or dizzy. Do not drive, use machinery, or do anything that needs mental alertness until you know how this drug affects you. Do not stand up or sit up quickly, especially if you are an older patient. This reduces the risk of dizzy or fainting spells. Alcohol may interfere with the effect of this drug. Avoid alcoholic drinks. This drug will cause constipation. If you do not have a bowel movement for 3 days, call your health care provider. Your mouth may get dry. Chewing sugarless gum or sucking hard candy and drinking plenty of water may help. Contact your health care provider if the problem does not go away or is severe. What side effects may I notice from receiving this medicine? Side effects that you should report to your doctor or health care professional as soon as possible:  allergic reactions (skin rash, itching or hives; swelling of the face, lips, or tongue)  confusion  low adrenal gland function (nausea; vomiting; loss of appetite; unusually weak or tired; dizziness; low blood pressure)  low  blood pressure (dizziness; feeling faint or lightheaded, falls; unusually weak or tired)  low blood sugar (feeling anxious; confusion, dizziness, increased hunger; unusually weak or tired; increased sweating; shakiness; cold, clammy skin; irritable; headache; blurred vision; fast heartbeat; loss of consciousness)  low sodium level (muscle weakness, fatigue, dizziness, headache, confusion)  redness, blistering, peeling or loosening of the skin, including inside the mouth  seizures  serotonin syndrome (irritable; confusion; diarrhea; fast or irregular heartbeat; muscle twitching; stiff muscles; trouble walking; sweating; high fever; seizures; chills;  vomiting)  trouble breathing Side effects that usually do not require medical attention (report to your doctor or health care professional if they continue or are bothersome):  constipation  dry mouth  nausea, vomiting  tiredness This list may not describe all possible side effects. Call your doctor for medical advice about side effects. You may report side effects to FDA at 1-800-FDA-1088. Where should I keep my medicine? Keep out of the reach of children and pets. This medicine can be abused. Keep it in a safe place to protect it from theft. Do not share it with anyone. It is only for you. Selling or giving away this medicine is dangerous and against the law. Store at room temperature between 20 and 25 degrees C (68 and 77 degrees F). Get rid of any unused medicine after the expiration date. This medicine may cause harm and death if it is taken by other adults, children, or pets. It is important to get rid of the medicine as soon as you no longer need it or it is expired. You can do this in two ways:  Take the medicine to a medicine take-back program. Check with your pharmacy or law enforcement to find a location.  If you cannot return the medicine, check the label or package insert to see if the medicine should be thrown out in the garbage or flushed down the toilet. If you are not sure, ask your health care provider. If it is safe to put it in the trash, take the medicine out of the container. Mix the medicine with cat litter, dirt, coffee grounds, or other unwanted substance. Seal the mixture in a bag or container. Put it in the trash. NOTE: This sheet is a summary. It may not cover all possible information. If you have questions about this medicine, talk to your doctor, pharmacist, or health care provider.  2021 [employer] Standard (2019-12-21 13:34:36)

## 2020-04-26 NOTE — Progress Notes (Signed)
Nursing Pain Medication Assessment:  Safety precautions to be maintained throughout the outpatient stay will include: orient to surroundings, keep bed in low position, maintain call bell within reach at all times, provide assistance with transfer out of bed and ambulation.  Medication Inspection Compliance: Pill count conducted under aseptic conditions, in front of the patient. Neither the pills nor the bottle was removed from the patient's sight at any time. Once count was completed pills were immediately returned to the patient in their original bottle.  Medication: Hydrocodone/APAP Pill/Patch Count: 1 of 120 pills remain Pill/Patch Appearance: Markings consistent with prescribed medication Bottle Appearance: Standard pharmacy container. Clearly labeled. Filled Date: 02 / 09/ 2022 Last Medication intake:  Today

## 2020-05-14 IMAGING — MG DIGITAL SCREENING BILATERAL MAMMOGRAM WITH TOMO AND CAD
8 series · 8 of 24 positions shown · non-contrast
Comparison: Previous exam(s).

CLINICAL DATA: Screening.

EXAM:
DIGITAL SCREENING BILATERAL MAMMOGRAM WITH TOMO AND CAD

[R MLO synth-2D]
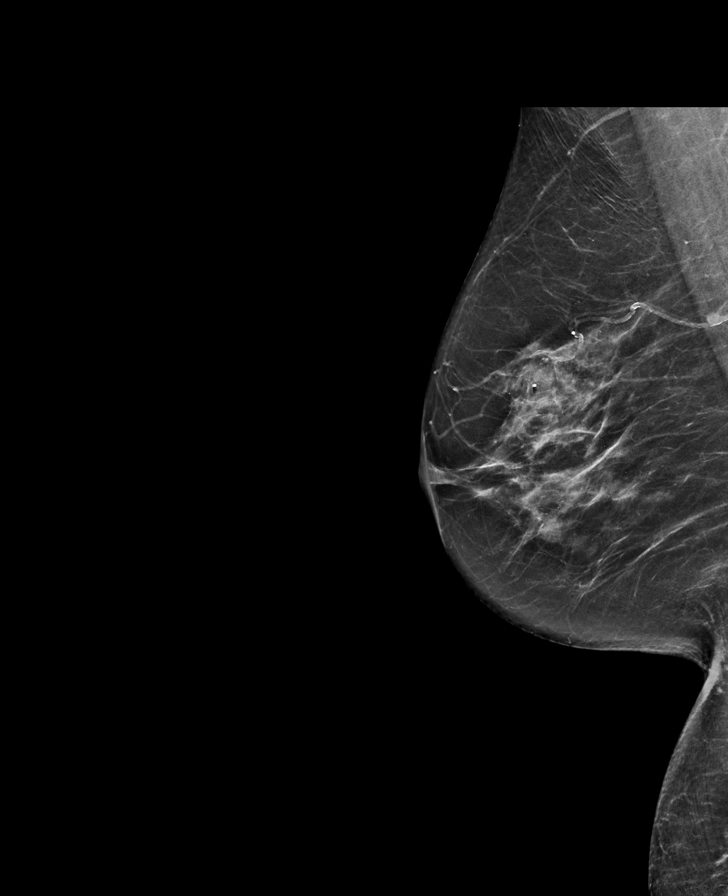

[L MLO synth-2D]
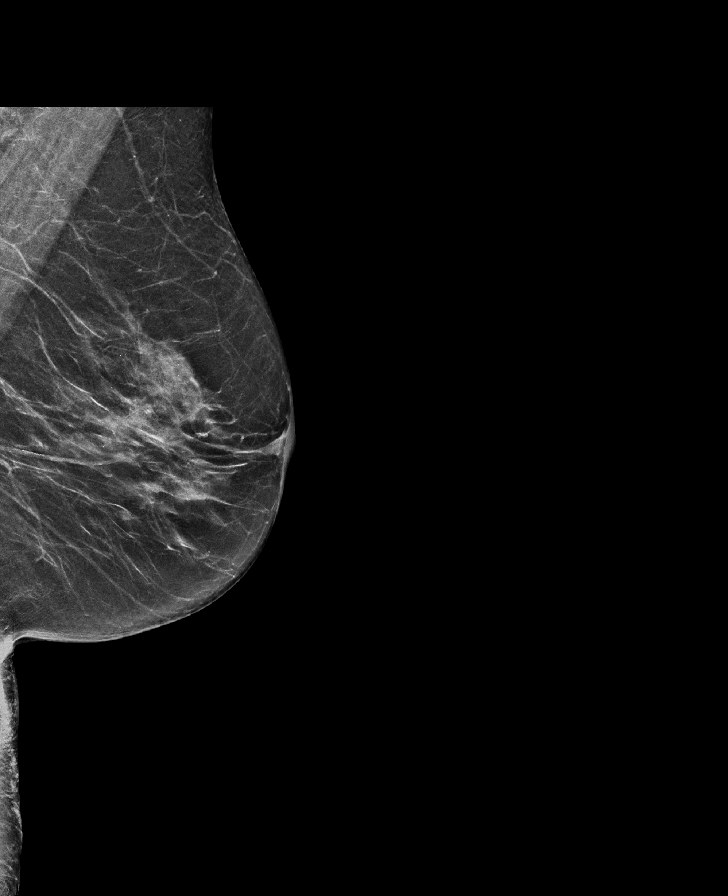

[L CC synth-2D]
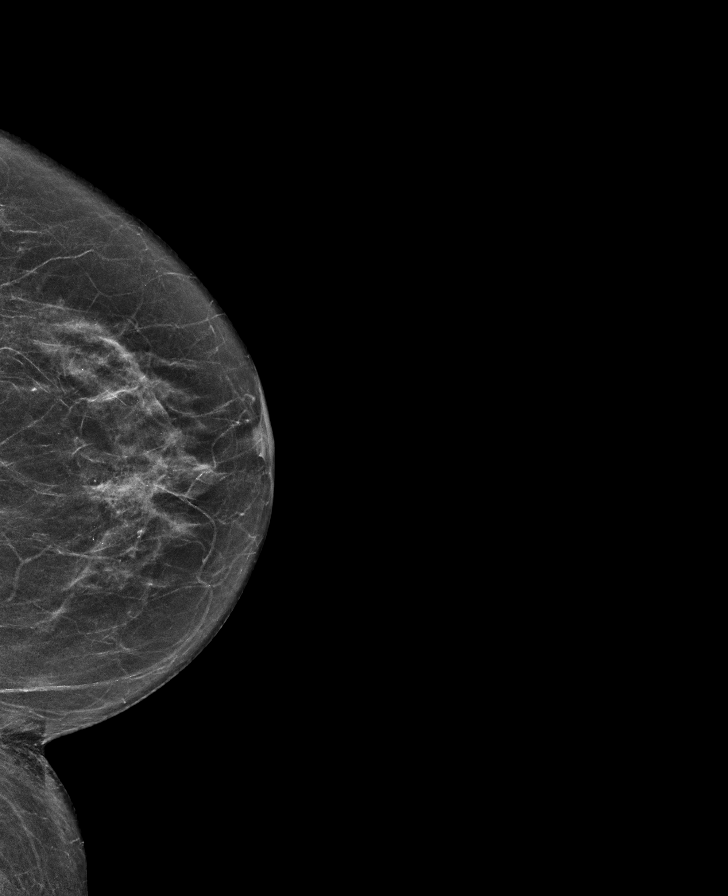

[R CC synth-2D]
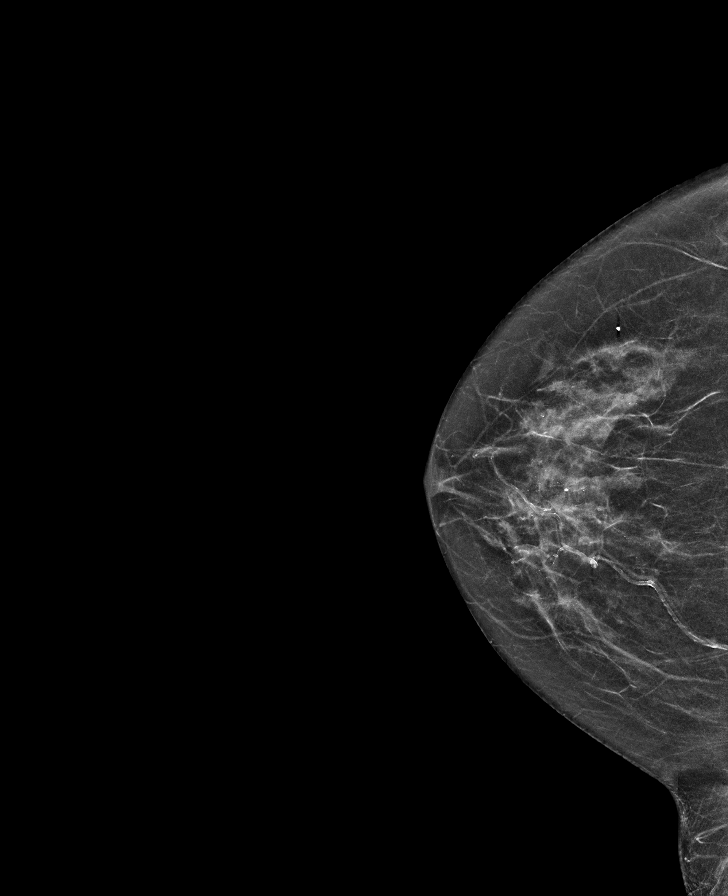

[R MLO tomo · tomo slice 33/64.0]
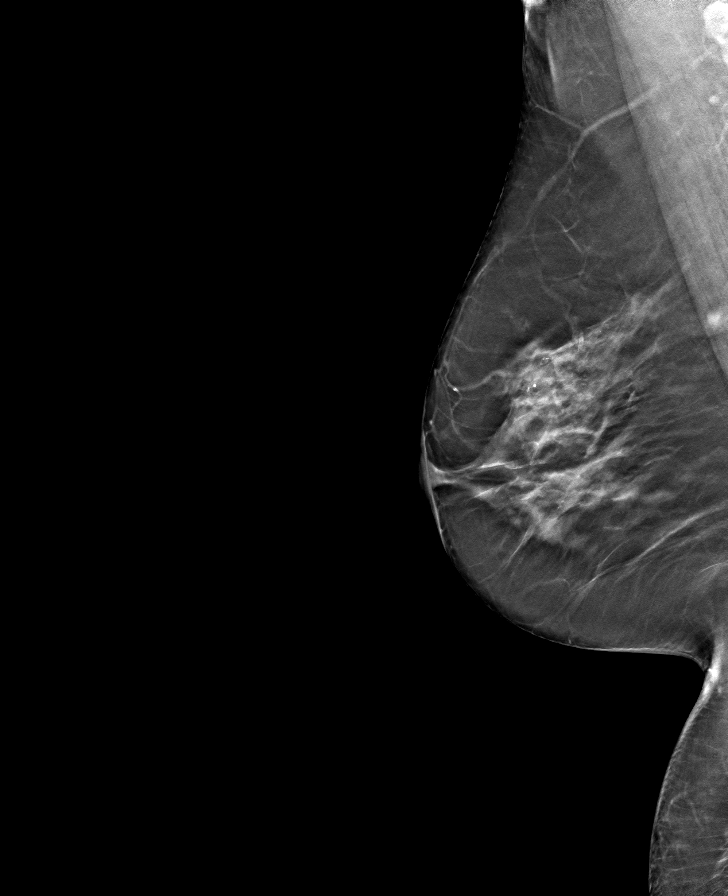

[L CC tomo · tomo slice 29/57.0]
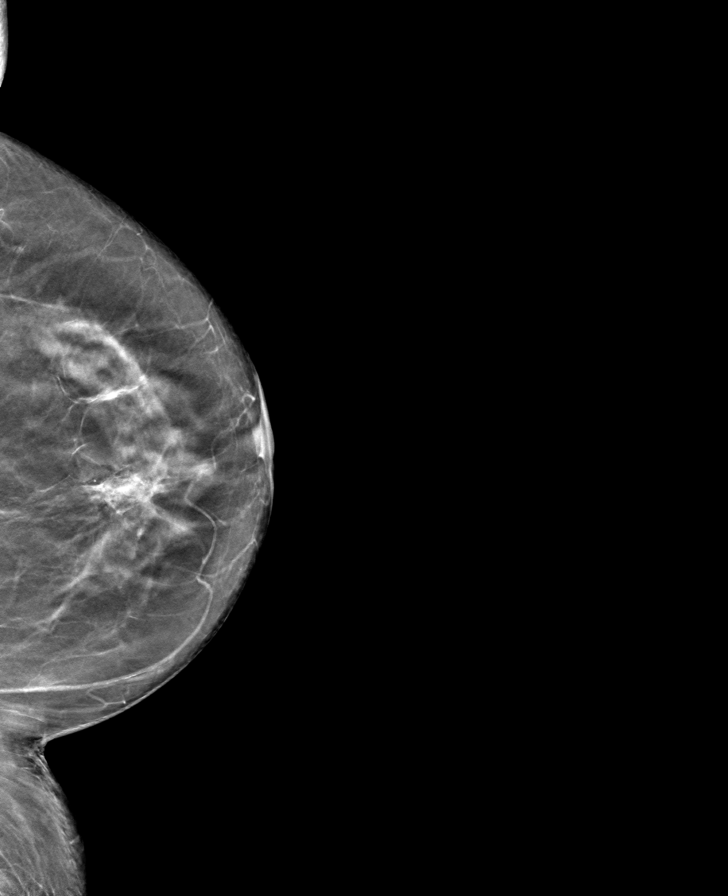

[L MLO tomo · tomo slice 31/60.0]
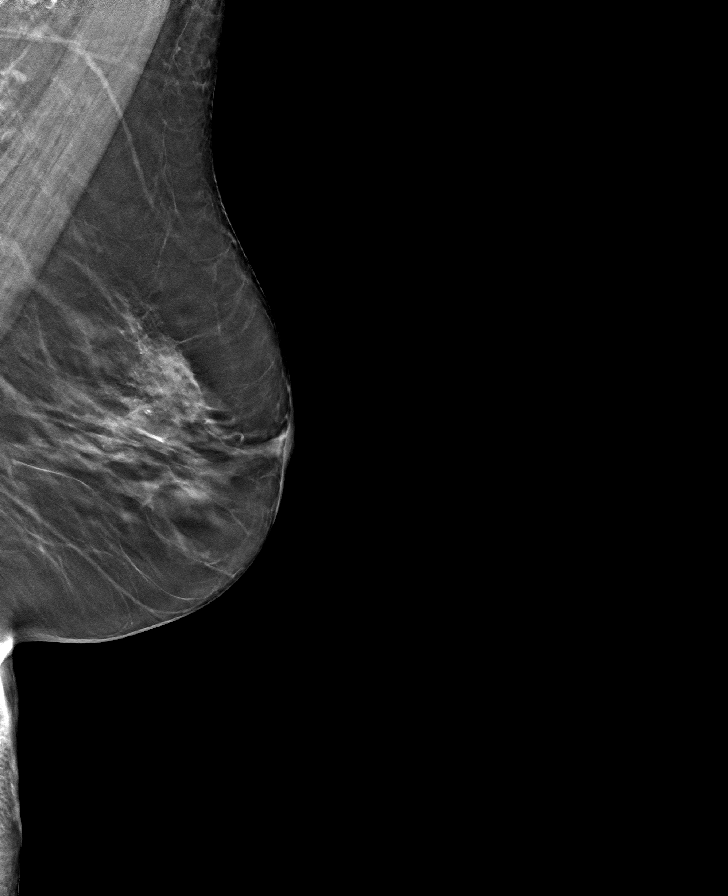

[R CC tomo · tomo slice 27/54.0]
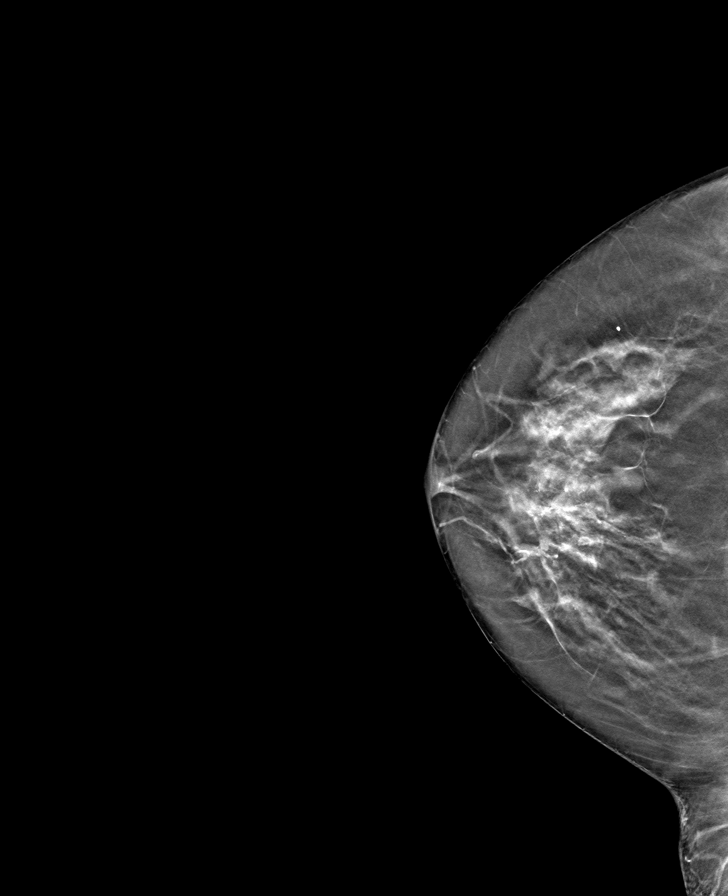

[8 of 24 positions shown; findings below may reference images not displayed]

ACR Breast Density Category c: The breast tissue is heterogeneously
dense, which may obscure small masses.
FINDINGS: There are no findings suspicious for malignancy. Images were
processed with CAD.
IMPRESSION: No mammographic evidence of malignancy. A result letter of this
screening mammogram will be mailed directly to the patient.

RECOMMENDATION:
Screening mammogram in one year. (Code:[id number])

BI-RADS CATEGORY  1: Negative.

## 2020-05-30 ENCOUNTER — Telehealth: Payer: Self-pay | Admitting: Student in an Organized Health Care Education/Training Program

## 2020-05-30 NOTE — Telephone Encounter (Signed)
Patient states that the pharmacy does not have her Tramadol until the end of the month.  Patient states that she takes 1/day and has 16 left.  She thinks she will be fine until they get them in stock.  Instructed patient to call when she had a couple of pills left and we would reevaluate if the pharmacy has not gotten them in stock,.

## 2020-06-07 ENCOUNTER — Other Ambulatory Visit: Payer: Self-pay | Admitting: Student in an Organized Health Care Education/Training Program

## 2020-06-07 ENCOUNTER — Telehealth: Payer: Self-pay | Admitting: Student in an Organized Health Care Education/Training Program

## 2020-06-07 DIAGNOSIS — M19031 Primary osteoarthritis, right wrist: Secondary | ICD-10-CM | POA: Diagnosis not present

## 2020-06-07 DIAGNOSIS — M79644 Pain in right finger(s): Secondary | ICD-10-CM | POA: Diagnosis not present

## 2020-06-07 DIAGNOSIS — M85841 Other specified disorders of bone density and structure, right hand: Secondary | ICD-10-CM | POA: Diagnosis not present

## 2020-06-07 DIAGNOSIS — M19041 Primary osteoarthritis, right hand: Secondary | ICD-10-CM | POA: Diagnosis not present

## 2020-06-07 MED ORDER — GABAPENTIN 400 MG PO CAPS: 400.0000 mg | ORAL_CAPSULE | Freq: Two times a day (BID) | ORAL | 2 refills | Status: DC

## 2020-06-07 NOTE — Telephone Encounter (Signed)
I have messaged Dr. Holley Raring and will call patient with details.

## 2020-06-07 NOTE — Telephone Encounter (Signed)
Script for Gabapentin sent by Dr. Holley Raring.

## 2020-06-21 ENCOUNTER — Encounter: Payer: Self-pay | Admitting: Student in an Organized Health Care Education/Training Program

## 2020-06-21 ENCOUNTER — Ambulatory Visit
Payer: PPO | Attending: Student in an Organized Health Care Education/Training Program | Admitting: Student in an Organized Health Care Education/Training Program

## 2020-06-21 ENCOUNTER — Other Ambulatory Visit: Payer: Self-pay

## 2020-06-21 VITALS — BP 152/79 | HR 83 | Temp 97.2°F | Resp 16 | Ht 61.0 in | Wt 132.0 lb

## 2020-06-21 DIAGNOSIS — G8929 Other chronic pain: Secondary | ICD-10-CM | POA: Diagnosis not present

## 2020-06-21 DIAGNOSIS — M47816 Spondylosis without myelopathy or radiculopathy, lumbar region: Secondary | ICD-10-CM | POA: Insufficient documentation

## 2020-06-21 DIAGNOSIS — G894 Chronic pain syndrome: Secondary | ICD-10-CM | POA: Diagnosis not present

## 2020-06-21 DIAGNOSIS — H5316 Psychophysical visual disturbances: Secondary | ICD-10-CM | POA: Insufficient documentation

## 2020-06-21 DIAGNOSIS — M5416 Radiculopathy, lumbar region: Secondary | ICD-10-CM | POA: Insufficient documentation

## 2020-06-21 DIAGNOSIS — M170 Bilateral primary osteoarthritis of knee: Secondary | ICD-10-CM | POA: Diagnosis not present

## 2020-06-21 DIAGNOSIS — M19011 Primary osteoarthritis, right shoulder: Secondary | ICD-10-CM | POA: Insufficient documentation

## 2020-06-21 DIAGNOSIS — H539 Unspecified visual disturbance: Secondary | ICD-10-CM | POA: Insufficient documentation

## 2020-06-21 MED ORDER — HYDROCODONE-ACETAMINOPHEN 5-325 MG PO TABS: 1.0000 | ORAL_TABLET | Freq: Four times a day (QID) | ORAL | 0 refills | Status: DC | PRN

## 2020-06-21 MED ORDER — HYDROCODONE-ACETAMINOPHEN 5-325 MG PO TABS: 1.0000 | ORAL_TABLET | Freq: Four times a day (QID) | ORAL | 0 refills | Status: AC | PRN

## 2020-06-21 MED ORDER — TRAMADOL HCL ER 100 MG PO TB24: 100.0000 mg | ORAL_TABLET | Freq: Every day | ORAL | 2 refills | Status: DC

## 2020-06-21 NOTE — Progress Notes (Signed)
Nursing Pain Medication Assessment:  Safety precautions to be maintained throughout the outpatient stay will include: orient to surroundings, keep bed in low position, maintain call bell within reach at all times, provide assistance with transfer out of bed and ambulation.  Medication Inspection Compliance: Pill count conducted under aseptic conditions, in front of the patient. Neither the pills nor the bottle was removed from the patient's sight at any time. Once count was completed pills were immediately returned to the patient in their original bottle.  Medication #1: Hydrocodone/APAP Pill/Patch Count: 53 of 120 pills remain Pill/Patch Appearance: Markings consistent with prescribed medication Bottle Appearance: Standard pharmacy container. Clearly labeled. Filled Date: 4 / 8 / 2022 Last Medication intake:  Today  Medication #2: Tramadol (Ultram) Pill/Patch Count: 18 of 30 pills remain Pill/Patch Appearance: Markings consistent with prescribed medication Bottle Appearance: Standard pharmacy container. Clearly labeled. Filled Date: 4 / 20 / 2022 Last Medication intake:  Yesterday

## 2020-06-21 NOTE — Progress Notes (Signed)
PROVIDER NOTE: Information contained herein reflects review and annotations entered in association with encounter. Interpretation of such information and data should be left to medically-trained personnel. Information provided to patient can be located elsewhere in the medical record under "Patient Instructions". Document created using STT-dictation technology, any transcriptional errors that may result from process are unintentional.    Patient: Sandra [name]  Service Category: E/M  Provider: Gillis Santa, MD  DOB: 10-Jan-1956  DOS: 06/21/2020  Specialty: Interventional Pain Management  MRN: [id number]  Setting: Ambulatory outpatient  PCP: Baxter Hire, MD  Type: Established Patient    Referring Provider: Baxter Hire, MD  Location: Office  Delivery: Face-to-face     HPI  [name], a 65 y.o. year old female, is here today because of her Localized osteoarthritis of right shoulder [M19.011]. [name] primary complain today is Back Pain (lower) and Arm Pain (Right; pt states it is related to dislocated thumb (since 04/2020; pt plans to see pcp)) Last encounter: My last encounter with her was on 06/07/2020. Pertinent problems: [name] has Long term current use of opiate analgesic; Long term prescription opiate use; Opiate use; Hallucinations, visual; Psychiatric disorder; Chronic low back pain (Location of Primary Source of Pain) (Bilateral) (R>L); Chronic bilateral knee pain; Opiate dependence ([place]); Lumbar facet arthropathy; Lumbar spondylosis; Lumbar facet syndrome (Bilateral) (R>L); Diffuse myofascial pain syndrome; [name] (CBS); Arthropathy of right elbow; and Localized osteoarthritis of right shoulder on their pertinent problem list. Pain Assessment: Severity of Chronic pain is reported as a 5 /10. Location: Back Lower/denies. Onset: More than a month ago. Quality: Burning. Timing: Constant. Modifying factor(s): meds. Vitals:  height is 5' 1" (1.549 m) and  weight is 132 lb (59.9 kg). Her temporal temperature is 97.2 F (36.2 C) (abnormal). Her blood pressure is 152/79 (abnormal) and her pulse is 83. Her respiration is 16 and oxygen saturation is 100%.   Reason for encounter: medication management.    No change in medical history since last visit other than patient injured her right thumb.  No inciting or traumatic event.  This is been going on for over 4 weeks although it is starting to improve.  She did get an x-ray which did not reveal any fracture from her PCP.  Patient continues multimodal pain regimen as prescribed with acetaminophenwith APAP, Amitriptyline, Tramadol 100 mg ER and Hydrocodone for breakthrough pain.  Patient is noticing benefit with the addition of tramadol ER in  her overall chronic pain.  States that it provides pain relief and improvement in functional status.   Pharmacotherapy Assessment   Analgesic: Hydrocodone 5 mg every 6 hours as needed, quantity 120/month; MME equals 20.  Addition of tramadol 100 mg ER daily for long-acting pain relief .    Monitoring: [place] PMP: PDMP reviewed during this encounter.       Pharmacotherapy: No side-effects or adverse reactions reported. Compliance: No problems identified. Effectiveness: Clinically acceptable.  [name], RN  06/21/2020  1:50 PM  Sign when Signing Visit Nursing Pain Medication Assessment:  Safety precautions to be maintained throughout the outpatient stay will include: orient to surroundings, keep bed in low position, maintain call bell within reach at all times, provide assistance with transfer out of bed and ambulation.  Medication Inspection Compliance: Pill count conducted under aseptic conditions, in front of the patient. Neither the pills nor the bottle was removed from the patient's sight at any time. Once count was completed pills were immediately returned to the patient in  their original bottle.  Medication #1: Hydrocodone/APAP Pill/Patch Count: 53 of 120  pills remain Pill/Patch Appearance: Markings consistent with prescribed medication Bottle Appearance: Standard pharmacy container. Clearly labeled. Filled Date: 4 / 8 / 2022 Last Medication intake:  Today  Medication #2: Tramadol (Ultram) Pill/Patch Count: 18 of 30 pills remain Pill/Patch Appearance: Markings consistent with prescribed medication Bottle Appearance: Standard pharmacy container. Clearly labeled. Filled Date: 4 / 63 / 2022 Last Medication intake:  Yesterday    UDS:  Summary  Date Value Ref Range Status  01/04/2020 Note  Final    Comment:    ==================================================================== [facility] (MW) ==================================================================== Test                             Result       Flag       Units  Drug Present and Declared for Prescription Verification   Tramadol                       >1404        EXPECTED   ng/mg creat   O-Desmethyltramadol            >1404        EXPECTED   ng/mg creat   N-Desmethyltramadol            >1404        EXPECTED   ng/mg creat    Source of tramadol is a prescription medication. O-desmethyltramadol    and N-desmethyltramadol are expected metabolites of tramadol.  Drug Absent but Declared for Prescription Verification   Temazepam                      Not Detected UNEXPECTED ng/mg creat ==================================================================== Test                      Result    Flag   Units      Ref Range   Creatinine              356              mg/dL      >=20 ==================================================================== Declared Medications:  The flagging and interpretation on this report are based on the  following declared medications.  Unexpected results may arise from  inaccuracies in the declared medications.   **Note: The testing scope of this panel includes these medications:   Temazepam  Tramadol   **Note: The testing scope of this  panel does not include the  following reported medications:   Acetaminophen  Albuterol  Amitriptyline  Amlodipine (Lotrel)  Amlodipine  Aspirin  Benazepril (Lotrel)  Cyanocobalamin  Cyclobenzaprine  Dextromethorphan  Esomeprazole (Nexium)  Famotidine  Fluticasone  Gabapentin  Guaifenesin  Hydrochlorothiazide  Hydroxyzine  Levocetirizine (Xyzal)  Meclizine  Melatonin  Montelukast  Naproxen  Ondansetron  Promethazine  Sertraline  Simvastatin  Sucralfate ==================================================================== For clinical consultation, please call [phone]. ====================================================================      ROS  Constitutional: Denies any fever or chills Gastrointestinal: No reported hemesis, hematochezia, vomiting, or acute GI distress Musculoskeletal: Right thumb pain , low back pain Neurological: No reported episodes of acute onset apraxia, aphasia, dysarthria, agnosia, amnesia, paralysis, loss of coordination, or loss of consciousness  Medication Review  Dextromethorphan-guaiFENesin, HYDROcodone-acetaminophen, Melatonin, acetaminophen, albuterol, amLODipine, amitriptyline, amlodipine-benazepril, aspirin, cyclobenzaprine, esomeprazole, famotidine, fluticasone, gabapentin, hydrOXYzine, hydrochlorothiazide, levocetirizine, meclizine, montelukast, naproxen, ondansetron, promethazine, sertraline, simvastatin, sucralfate,  temazepam, traMADol, and vitamin B-12  History Review  Allergy: [name] is allergic to trazodone, acetaminophen-codeine, codeine, oxycontin [oxycodone hcl], trazodone and nefazodone, vicodin [hydrocodone-acetaminophen], zolpidem, hydrocodone-acetaminophen, and oxycodone-acetaminophen. Drug: [name]  reports no history of drug use. Alcohol:  reports no history of alcohol use. Tobacco:  reports that she quit smoking about 46 years ago. She has never used smokeless tobacco. Social: [name]  reports  that she quit smoking about 46 years ago. She has never used smokeless tobacco. She reports that she does not drink alcohol and does not use drugs. Medical:  has a past medical history of Anemia, Anxiety (08/01/2014), Arthritis, degenerative (07/30/2013), Clinical depression (06/30/2014), Depression, Elevated liver enzymes, GERD (gastroesophageal reflux disease), H/O breast biopsy (01/05/2015), H/O: attempted suicide (2013), Hepatitis B, Hiatal hernia, History of hysterectomy (01/05/2015), History of peptic ulcer disease (01/05/2015), Hypercholesteremia, Hypertension, Legally blind, Panic attacks, PUD (peptic ulcer disease), Rectocele (01/05/2015), Retinitis pigmentosa, and S/P cholecystectomy (01/05/2015). Surgical: [name]  has a past surgical history that includes Cholecystectomy; Rectocele repair; Abdominal hysterectomy; Appendectomy; Colonoscopy; Esophagogastroduodenoscopy; Sphincterotomy (N/A, 01/24/2017); Hemorrhoid surgery (N/A, 03/07/2017); Hernia repair; Esophagogastroduodenoscopy (egd) with propofol (N/A, 09/17/2017); Oophorectomy; and Breast biopsy (Bilateral, 1977). Family: family history includes Breast cancer in her sister; Cancer in her father; Dementia in her mother; Diabetes in her mother; Hypertension in her mother; Prostate cancer in her father.  Laboratory Chemistry Profile   Renal Lab Results  Component Value Date   BUN 18 01/16/2017   CREATININE 0.75 01/16/2017   GFRAA >60 01/16/2017   GFRNONAA >60 01/16/2017     Hepatic Lab Results  Component Value Date   AST 24 05/13/2015   ALT 13 (L) 05/13/2015   ALBUMIN 4.5 05/13/2015   ALKPHOS 75 05/13/2015     Electrolytes Lab Results  Component Value Date   NA 138 01/16/2017   K 4.2 01/16/2017   CL 101 01/16/2017   CALCIUM 9.4 01/16/2017   MG 2.0 02/07/2015     Bone Lab Results  Component Value Date   25OHVITD1 19 (L) 02/07/2015   25OHVITD2 3.9 02/07/2015   25OHVITD3 15 02/07/2015     Inflammation (CRP: Acute  Phase) (ESR: Chronic Phase) Lab Results  Component Value Date   CRP <0.5 02/07/2015   ESRSEDRATE 26 02/07/2015       Note: Above Lab results reviewed.  Physical Exam  General appearance: Well nourished, well developed, and well hydrated. In no apparent acute distress Mental status: Alert, oriented x 3 (person, place, & time)       Respiratory: No evidence of acute respiratory distress Eyes: PERLA Vitals: BP (!) 152/79   Pulse 83   Temp (!) 97.2 F (36.2 C) (Temporal)   Resp 16   Ht 5' 1" (1.549 m)   Wt 132 lb (59.9 kg)   SpO2 100%   BMI 24.94 kg/m  BMI: Estimated body mass index is 24.94 kg/m as calculated from the following:   Height as of this encounter: 5' 1" (1.549 m).   Weight as of this encounter: 132 lb (59.9 kg). Ideal: Ideal body weight: 47.8 kg (105 lb 6.1 oz) Adjusted ideal body weight: 52.6 kg (116 lb 0.4 oz)  Right thumb pain, pain with abduction  Lumbar Spine Area Exam  Skin & Axial Inspection:No masses, redness, or swelling Alignment:Symmetrical Functional UVO:ZDGU restricted ROM Stability:No instability detected Muscle Tone/Strength:Functionally intact. No obvious neuro-muscular anomalies detected. Sensory (Neurological):Musculoskeletal pain pattern  Gait & Posture Assessment  Ambulation:Patient came in today in a wheel chair Gait:Antalgic gait (  limping) Posture:Difficulty standing up straight, due to pain Lower Extremity Exam    Side:Right lower extremity  Side:Left lower extremity  Stability:No instability observed  Stability:No instability observed  Skin & Extremity Inspection:Skin color, temperature, and hair growth are WNL. No peripheral edema or cyanosis. No masses, redness, swelling, asymmetry, or associated skin lesions. No contractures.  Skin & Extremity Inspection:Skin color, temperature, and hair growth are WNL. No peripheral edema or cyanosis. No masses, redness, swelling, asymmetry, or associated  skin lesions. No contractures.  Functional MLJ:QGBE restricted ROMfor hip and knee joints   Functional EFE:OFHQ restricted ROMfor hip and knee joints   Muscle Tone/Strength:Functionally intact. No obvious neuro-muscular anomalies detected.  Muscle Tone/Strength:Functionally intact. No obvious neuro-muscular anomalies detected.  Sensory (Neurological):Musculoskeletal pain pattern  Sensory (Neurological):Musculoskeletal pain pattern  DTR: Patellar:deferred today Achilles:deferred today Plantar:deferred today  DTR: Patellar:deferred today Achilles:deferred today Plantar:deferred today  Palpation:No palpable anomalies  Palpation:No palpable anomalies    Assessment   Status Diagnosis  Controlled Controlled Controlled 1. Localized osteoarthritis of right shoulder   2. Chronic radicular lumbar pain   3. Bilateral primary osteoarthritis of knee   4. Chronic pain syndrome   5. Lumbar facet syndrome (Bilateral) (R>L)   6. Lumbar facet arthropathy   7. [name] (CBS)   8. Impaired visual perception       Plan of Care   [name] has a current medication list which includes the following long-term medication(s): albuterol, amitriptyline, cyclobenzaprine, esomeprazole, famotidine, famotidine, fluticasone, gabapentin, hydrochlorothiazide, levocetirizine, montelukast, promethazine, simvastatin, and sucralfate.  Pharmacotherapy (Medications Ordered): Meds ordered this encounter  Medications  . traMADol (ULTRAM-ER) 100 MG 24 hr tablet    Sig: Take 1 tablet (100 mg total) by mouth daily.    Dispense:  30 tablet    Refill:  2    For chronic pain syndrome  . HYDROcodone-acetaminophen (NORCO/VICODIN) 5-325 MG tablet    Sig: Take 1 tablet by mouth every 6 (six) hours as needed for severe pain. Must last 30 days.    Dispense:  120 tablet    Refill:  0    Chronic Pain. (STOP Act - Not applicable). Fill one day early if  closed on scheduled refill date.  Marland Kitchen HYDROcodone-acetaminophen (NORCO/VICODIN) 5-325 MG tablet    Sig: Take 1 tablet by mouth every 6 (six) hours as needed for severe pain. Must last 30 days.    Dispense:  120 tablet    Refill:  0    Chronic Pain. (STOP Act - Not applicable). Fill one day early if closed on scheduled refill date.  Marland Kitchen HYDROcodone-acetaminophen (NORCO/VICODIN) 5-325 MG tablet    Sig: Take 1 tablet by mouth every 6 (six) hours as needed for severe pain. Must last 30 days.    Dispense:  120 tablet    Refill:  0    Chronic Pain. (STOP Act - Not applicable). Fill one day early if closed on scheduled refill date.   Continue multimodal analgesics with acetaminophen, amitriptyline, gabapentin as prescribed.  Follow-up plan:   Return in about 3 months (around 09/21/2020) for Medication Management, in person.     Status post bilateral intra-articular knee steroid, right elbow injection on 04/05/2019, 03/01/20      Recent Visits Date Type Provider Dept  04/26/20 Office Visit Gillis Santa, MD [facility]  03/29/20 Office Visit Gillis Santa, MD [facility]  Showing recent visits within past 90 days and meeting all other requirements Today's Visits Date Type Provider Dept  06/21/20 Office  Visit Gillis Santa, MD [facility]  Showing today's visits and meeting all other requirements Future Appointments No visits were found meeting these conditions. Showing future appointments within next 90 days and meeting all other requirements  I discussed the assessment and treatment plan with the patient. The patient was provided an opportunity to ask questions and all were answered. The patient agreed with the plan and demonstrated an understanding of the instructions.  Patient advised to call back or seek an in-person evaluation if the symptoms or condition worsens.  Duration of encounter: 10mnutes.  Note by: BGillis Santa MD Date: 06/21/2020; Time: 2:13 PM

## 2020-06-22 ENCOUNTER — Encounter: Payer: PPO | Admitting: Student in an Organized Health Care Education/Training Program

## 2020-07-03 DIAGNOSIS — M79644 Pain in right finger(s): Secondary | ICD-10-CM | POA: Diagnosis not present

## 2020-07-03 DIAGNOSIS — H60501 Unspecified acute noninfective otitis externa, right ear: Secondary | ICD-10-CM | POA: Diagnosis not present

## 2020-07-11 DIAGNOSIS — E538 Deficiency of other specified B group vitamins: Secondary | ICD-10-CM | POA: Diagnosis not present

## 2020-07-11 DIAGNOSIS — H5316 Psychophysical visual disturbances: Secondary | ICD-10-CM | POA: Diagnosis not present

## 2020-07-11 DIAGNOSIS — R42 Dizziness and giddiness: Secondary | ICD-10-CM | POA: Diagnosis not present

## 2020-07-11 DIAGNOSIS — F0631 Mood disorder due to known physiological condition with depressive features: Secondary | ICD-10-CM | POA: Diagnosis not present

## 2020-07-11 DIAGNOSIS — I499 Cardiac arrhythmia, unspecified: Secondary | ICD-10-CM | POA: Diagnosis not present

## 2020-07-11 DIAGNOSIS — H9311 Tinnitus, right ear: Secondary | ICD-10-CM | POA: Diagnosis not present

## 2020-07-11 DIAGNOSIS — E559 Vitamin D deficiency, unspecified: Secondary | ICD-10-CM | POA: Diagnosis not present

## 2020-07-11 DIAGNOSIS — Z79899 Other long term (current) drug therapy: Secondary | ICD-10-CM | POA: Diagnosis not present

## 2020-07-11 DIAGNOSIS — R441 Visual hallucinations: Secondary | ICD-10-CM | POA: Diagnosis not present

## 2020-07-25 DIAGNOSIS — I10 Essential (primary) hypertension: Secondary | ICD-10-CM | POA: Diagnosis not present

## 2020-07-25 DIAGNOSIS — E7800 Pure hypercholesterolemia, unspecified: Secondary | ICD-10-CM | POA: Diagnosis not present

## 2020-07-25 DIAGNOSIS — R000 Tachycardia, unspecified: Secondary | ICD-10-CM | POA: Diagnosis not present

## 2020-07-25 DIAGNOSIS — R002 Palpitations: Secondary | ICD-10-CM | POA: Diagnosis not present

## 2020-07-25 DIAGNOSIS — R0602 Shortness of breath: Secondary | ICD-10-CM | POA: Diagnosis not present

## 2020-07-25 DIAGNOSIS — R9431 Abnormal electrocardiogram [ECG] [EKG]: Secondary | ICD-10-CM | POA: Diagnosis not present

## 2020-08-08 DIAGNOSIS — R002 Palpitations: Secondary | ICD-10-CM | POA: Diagnosis not present

## 2020-08-08 DIAGNOSIS — R000 Tachycardia, unspecified: Secondary | ICD-10-CM | POA: Diagnosis not present

## 2020-08-17 DIAGNOSIS — M79644 Pain in right finger(s): Secondary | ICD-10-CM | POA: Diagnosis not present

## 2020-08-17 DIAGNOSIS — G8929 Other chronic pain: Secondary | ICD-10-CM | POA: Diagnosis not present

## 2020-08-17 DIAGNOSIS — M19041 Primary osteoarthritis, right hand: Secondary | ICD-10-CM | POA: Diagnosis not present

## 2020-08-23 ENCOUNTER — Telehealth: Payer: Self-pay | Admitting: Student in an Organized Health Care Education/Training Program

## 2020-08-23 DIAGNOSIS — G8929 Other chronic pain: Secondary | ICD-10-CM

## 2020-08-23 MED ORDER — GABAPENTIN 400 MG PO CAPS: 400.0000 mg | ORAL_CAPSULE | Freq: Two times a day (BID) | ORAL | 5 refills | Status: DC

## 2020-08-23 NOTE — Telephone Encounter (Signed)
Please advise 

## 2020-08-23 NOTE — Telephone Encounter (Signed)
Called to inform patient that Gabapentin was called in. Patient with understanding

## 2020-08-23 NOTE — Telephone Encounter (Signed)
Patient needs refill on gabapentin, has med mgmt appt 09-21-20. But this one wasn't sent in to last until then. Please advise patient when she can pick up. Thank you

## 2020-09-04 DIAGNOSIS — R9431 Abnormal electrocardiogram [ECG] [EKG]: Secondary | ICD-10-CM | POA: Diagnosis not present

## 2020-09-04 DIAGNOSIS — R0602 Shortness of breath: Secondary | ICD-10-CM | POA: Diagnosis not present

## 2020-09-19 DIAGNOSIS — I10 Essential (primary) hypertension: Secondary | ICD-10-CM | POA: Diagnosis not present

## 2020-09-19 DIAGNOSIS — E7800 Pure hypercholesterolemia, unspecified: Secondary | ICD-10-CM | POA: Diagnosis not present

## 2020-09-19 DIAGNOSIS — R0602 Shortness of breath: Secondary | ICD-10-CM | POA: Diagnosis not present

## 2020-09-21 ENCOUNTER — Telehealth: Payer: Self-pay

## 2020-09-21 ENCOUNTER — Other Ambulatory Visit: Payer: Self-pay

## 2020-09-21 ENCOUNTER — Telehealth: Payer: Self-pay | Admitting: *Deleted

## 2020-09-21 ENCOUNTER — Ambulatory Visit
Payer: PPO | Attending: Student in an Organized Health Care Education/Training Program | Admitting: Student in an Organized Health Care Education/Training Program

## 2020-09-21 DIAGNOSIS — M19011 Primary osteoarthritis, right shoulder: Secondary | ICD-10-CM | POA: Diagnosis not present

## 2020-09-21 DIAGNOSIS — M170 Bilateral primary osteoarthritis of knee: Secondary | ICD-10-CM | POA: Diagnosis not present

## 2020-09-21 DIAGNOSIS — M47816 Spondylosis without myelopathy or radiculopathy, lumbar region: Secondary | ICD-10-CM | POA: Diagnosis not present

## 2020-09-21 DIAGNOSIS — G8929 Other chronic pain: Secondary | ICD-10-CM

## 2020-09-21 DIAGNOSIS — M5416 Radiculopathy, lumbar region: Secondary | ICD-10-CM | POA: Diagnosis not present

## 2020-09-21 DIAGNOSIS — H5316 Psychophysical visual disturbances: Secondary | ICD-10-CM

## 2020-09-21 DIAGNOSIS — H539 Unspecified visual disturbance: Secondary | ICD-10-CM | POA: Diagnosis not present

## 2020-09-21 DIAGNOSIS — G894 Chronic pain syndrome: Secondary | ICD-10-CM

## 2020-09-21 MED ORDER — HYDROCODONE-ACETAMINOPHEN 5-325 MG PO TABS: 1.0000 | ORAL_TABLET | Freq: Four times a day (QID) | ORAL | 0 refills | Status: AC | PRN

## 2020-09-21 MED ORDER — HYDROCODONE-ACETAMINOPHEN 5-325 MG PO TABS: 1.0000 | ORAL_TABLET | Freq: Four times a day (QID) | ORAL | 0 refills | Status: DC | PRN

## 2020-09-21 MED ORDER — GABAPENTIN 400 MG PO CAPS: 400.0000 mg | ORAL_CAPSULE | Freq: Two times a day (BID) | ORAL | 5 refills | Status: DC

## 2020-09-21 MED ORDER — CYCLOBENZAPRINE HCL 10 MG PO TABS: 10.0000 mg | ORAL_TABLET | Freq: Three times a day (TID) | ORAL | 2 refills | Status: DC | PRN

## 2020-09-21 MED ORDER — TRAMADOL HCL ER 100 MG PO TB24: 100.0000 mg | ORAL_TABLET | Freq: Every day | ORAL | 2 refills | Status: DC

## 2020-09-21 NOTE — Telephone Encounter (Signed)
Attempted to call for pre appointment review of allergies/meds. No answer, unable to leave a message.

## 2020-09-21 NOTE — Progress Notes (Signed)
Patient: Sandra [name]  Service Category: E/M  Provider: Gillis Santa, MD  DOB: 1955-04-11  DOS: 09/21/2020  Location: Office  MRN: [id number]  Setting: Ambulatory outpatient  Referring Provider: Baxter Hire, MD  Type: Established Patient  Specialty: Interventional Pain Management  PCP: Baxter Hire, MD  Location: Home  Delivery: TeleHealth     Virtual Encounter - Pain Management PROVIDER NOTE: Information contained herein reflects review and annotations entered in association with encounter. Interpretation of such information and data should be left to medically-trained personnel. Information provided to patient can be located elsewhere in the medical record under "Patient Instructions". Document created using STT-dictation technology, any transcriptional errors that may result from process are unintentional.    Contact & Pharmacy Preferred: [phone] Home: [phone] (home) Mobile: [phone] (mobile) E-mail: No e-mail address on record  [place], [place] [address] [place] [address] Phone: [phone] Fax: [phone]   Pre-screening  Sandra [name] offered "in-person" vs "virtual" encounter. She indicated preferring virtual for this encounter.   Reason COVID-19*  Social distancing based on CDC and AMA recommendations.   I contacted Sandra [name] on 09/21/2020 via video conference.      I clearly identified myself as Gillis Santa, MD. I verified that I was speaking with the correct person using two identifiers (Name: Sandra [name], and date of birth: Oct 12, 1955).  Consent I sought verbal advanced consent from Sandra [name] for virtual visit interactions. I informed Sandra [name] of possible security and privacy concerns, risks, and limitations associated with providing "not-in-person" medical evaluation and management services. I also informed Sandra [name] of the availability of "in-person" appointments. Finally, I informed her  that there would be a charge for the virtual visit and that she could be  personally, fully or partially, financially responsible for it. Sandra [name] expressed understanding and agreed to proceed.   Historic Elements   Sandra [name] is a 65 y.o. year old, female patient evaluated today after our last contact on 08/23/2020. Sandra [name]  has a past medical history of Anemia, Anxiety (08/01/2014), Arthritis, degenerative (07/30/2013), Clinical depression (06/30/2014), Depression, Elevated liver enzymes, GERD (gastroesophageal reflux disease), H/O breast biopsy (01/05/2015), H/O: attempted suicide (2013), Hepatitis B, Hiatal hernia, History of hysterectomy (01/05/2015), History of peptic ulcer disease (01/05/2015), Hypercholesteremia, Hypertension, Legally blind, Panic attacks, PUD (peptic ulcer disease), Rectocele (01/05/2015), Retinitis pigmentosa, and S/P cholecystectomy (01/05/2015). She also  has a past surgical history that includes Cholecystectomy; Rectocele repair; Abdominal hysterectomy; Appendectomy; Colonoscopy; Esophagogastroduodenoscopy; Sphincterotomy (N/A, 01/24/2017); Hemorrhoid surgery (N/A, 03/07/2017); Hernia repair; Esophagogastroduodenoscopy (egd) with propofol (N/A, 09/17/2017); Oophorectomy; and Breast biopsy (Bilateral, 1977). Sandra [name] has a current medication list which includes the following prescription(s): acetaminophen, albuterol, amitriptyline, amlodipine, amlodipine-benazepril, aspirin, cyclobenzaprine, esomeprazole, famotidine, famotidine, fluticasone, gabapentin, hydrochlorothiazide, [START ON 09/22/2020] hydrocodone-acetaminophen, [START ON 10/22/2020] hydrocodone-acetaminophen, [START ON 11/21/2020] hydrocodone-acetaminophen, hydroxyzine, levocetirizine, meclizine, melatonin, montelukast, naproxen, ondansetron, promethazine, robitussin cough+chest cong dm, simvastatin, sucralfate, temazepam, [START ON 10/05/2020] tramadol, and vitamin b-12. She  reports that she quit smoking about 47  years ago. She has never used smokeless tobacco. She reports that she does not drink alcohol and does not use drugs. Sandra [name] is allergic to trazodone, acetaminophen-codeine, codeine, oxycontin [oxycodone hcl], trazodone and nefazodone, vicodin [hydrocodone-acetaminophen], zolpidem, hydrocodone-acetaminophen, and oxycodone-acetaminophen.   HPI  Today, she is being contacted for medication management.  No change in medical history since last visit.  Patient's pain is at baseline.  Patient continues multimodal pain regimen as  prescribed.  States that it provides pain relief and improvement in functional status.   Pharmacotherapy Assessment   Analgesic: Hydrocodone 5 mg every 6 hours as needed, quantity 120/month; MME equals 20.  Addition of tramadol 100 mg ER daily for long-acting pain relief .    Monitoring: [place] PMP: PDMP reviewed during this encounter.       Pharmacotherapy: No side-effects or adverse reactions reported. Compliance: No problems identified. Effectiveness: Clinically acceptable. Plan: Refer to "POC". UDS:  Summary  Date Value Ref Range Status  01/04/2020 Note  Final    Comment:    ==================================================================== [employer] 13 (MW) ==================================================================== Test                             Result       Flag       Units  Drug Present and Declared for Prescription Verification   Tramadol                       >1404        EXPECTED   ng/mg creat   O-Desmethyltramadol            >1404        EXPECTED   ng/mg creat   N-Desmethyltramadol            >1404        EXPECTED   ng/mg creat    Source of tramadol is a prescription medication. O-desmethyltramadol    and N-desmethyltramadol are expected metabolites of tramadol.  Drug Absent but Declared for Prescription Verification   Temazepam                      Not Detected UNEXPECTED ng/mg  creat ==================================================================== Test                      Result    Flag   Units      Ref Range   Creatinine              356              mg/dL      >=20 ==================================================================== Declared Medications:  The flagging and interpretation on this report are based on the  following declared medications.  Unexpected results may arise from  inaccuracies in the declared medications.   **Note: The testing scope of this panel includes these medications:   Temazepam  Tramadol   **Note: The testing scope of this panel does not include the  following reported medications:   Acetaminophen  Albuterol  Amitriptyline  Amlodipine (Lotrel)  Amlodipine  Aspirin  Benazepril (Lotrel)  Cyanocobalamin  Cyclobenzaprine  Dextromethorphan  Esomeprazole (Nexium)  Famotidine  Fluticasone  Gabapentin  Guaifenesin  Hydrochlorothiazide  Hydroxyzine  Levocetirizine (Xyzal)  Meclizine  Melatonin  Montelukast  Naproxen  Ondansetron  Promethazine  Sertraline  Simvastatin  Sucralfate ==================================================================== For clinical consultation, please call [phone]. ====================================================================      Laboratory Chemistry Profile   Renal Lab Results  Component Value Date   BUN 18 01/16/2017   CREATININE 0.75 01/16/2017   GFRAA >60 01/16/2017   GFRNONAA >60 01/16/2017    Hepatic Lab Results  Component Value Date   AST 24 05/13/2015   ALT 13 (L) 05/13/2015   ALBUMIN 4.5 05/13/2015   ALKPHOS 75 05/13/2015    Electrolytes Lab Results  Component Value Date   NA  138 01/16/2017   K 4.2 01/16/2017   CL 101 01/16/2017   CALCIUM 9.4 01/16/2017   MG 2.0 02/07/2015    Bone Lab Results  Component Value Date   25OHVITD1 19 (L) 02/07/2015   25OHVITD2 3.9 02/07/2015   25OHVITD3 15 02/07/2015    Inflammation (CRP: Acute  Phase) (ESR: Chronic Phase) Lab Results  Component Value Date   CRP <0.5 02/07/2015   ESRSEDRATE 26 02/07/2015         Note: Above Lab results reviewed.    Assessment  The primary encounter diagnosis was Chronic radicular lumbar pain. Diagnoses of Localized osteoarthritis of right shoulder, Bilateral primary osteoarthritis of knee, Lumbar facet syndrome (Bilateral) (R>L), Lumbar facet arthropathy, Sandra Needs Syndrome (CBS), Impaired visual perception, and Chronic pain syndrome were also pertinent to this visit.  Plan of Care    Ms. Launi Asencio has a current medication list which includes the following long-term medication(s): albuterol, amitriptyline, cyclobenzaprine, esomeprazole, famotidine, famotidine, fluticasone, gabapentin, hydrochlorothiazide, levocetirizine, montelukast, promethazine, simvastatin, and sucralfate.  Pharmacotherapy (Medications Ordered): Meds ordered this encounter  Medications   HYDROcodone-acetaminophen (NORCO/VICODIN) 5-325 MG tablet    Sig: Take 1 tablet by mouth every 6 (six) hours as needed for severe pain. Must last 30 days.    Dispense:  120 tablet    Refill:  0    Chronic Pain. (STOP Act - Not applicable). Fill one day early if closed on scheduled refill date.   gabapentin (NEURONTIN) 400 MG capsule    Sig: Take 1 capsule (400 mg total) by mouth 2 (two) times daily. 400 mg tid    Dispense:  60 capsule    Refill:  5   cyclobenzaprine (FLEXERIL) 10 MG tablet    Sig: Take 1 tablet (10 mg total) by mouth 3 (three) times daily as needed for muscle spasms.    Dispense:  90 tablet    Refill:  2   HYDROcodone-acetaminophen (NORCO/VICODIN) 5-325 MG tablet    Sig: Take 1 tablet by mouth every 6 (six) hours as needed for severe pain. Must last 30 days.    Dispense:  120 tablet    Refill:  0    Chronic Pain. (STOP Act - Not applicable). Fill one day early if closed on scheduled refill date.   HYDROcodone-acetaminophen (NORCO/VICODIN) 5-325 MG  tablet    Sig: Take 1 tablet by mouth every 6 (six) hours as needed for severe pain. Must last 30 days.    Dispense:  120 tablet    Refill:  0    Chronic Pain. (STOP Act - Not applicable). Fill one day early if closed on scheduled refill date.   traMADol (ULTRAM-ER) 100 MG 24 hr tablet    Sig: Take 1 tablet (100 mg total) by mouth daily.    Dispense:  30 tablet    Refill:  2    For chronic pain syndrome     Follow-up plan:   Return in about 3 months (around 12/22/2020) for Medication Management, in person.     Status post bilateral intra-articular knee steroid, right elbow injection on 04/05/2019, 03/01/20       Recent Visits No visits were found meeting these conditions. Showing recent visits within past 90 days and meeting all other requirements Today's Visits Date Type Provider Dept  09/21/20 Telemedicine Gillis Santa, MD [facility]  Showing today's visits and meeting all other requirements Future Appointments No visits were found meeting these conditions. Showing future appointments within next 90 days and meeting all  other requirements I discussed the assessment and treatment plan with the patient. The patient was provided an opportunity to ask questions and all were answered. The patient agreed with the plan and demonstrated an understanding of the instructions.  Patient advised to call back or seek an in-person evaluation if the symptoms or condition worsens.  Duration of encounter: 58mnutes.  Note by: BGillis Santa MD Date: 09/21/2020; Time: 10:24 AM

## 2020-09-21 NOTE — Telephone Encounter (Signed)
Gabapentin dose clarified with pharmacy.

## 2020-09-21 NOTE — Telephone Encounter (Signed)
Pharm had a question about directions on Rx

## 2020-09-25 ENCOUNTER — Other Ambulatory Visit: Payer: Self-pay | Admitting: Internal Medicine

## 2020-09-25 DIAGNOSIS — Z1231 Encounter for screening mammogram for malignant neoplasm of breast: Secondary | ICD-10-CM

## 2020-09-27 ENCOUNTER — Emergency Department: Payer: PPO

## 2020-09-27 ENCOUNTER — Inpatient Hospital Stay
Admission: EM | Admit: 2020-09-27 | Discharge: 2020-09-30 | DRG: 179 | Disposition: A | Discharge status: Home / Self Care | Payer: PPO | Attending: Internal Medicine | Admitting: Internal Medicine

## 2020-09-27 ENCOUNTER — Other Ambulatory Visit: Payer: Self-pay

## 2020-09-27 DIAGNOSIS — Z8249 Family history of ischemic heart disease and other diseases of the circulatory system: Secondary | ICD-10-CM | POA: Diagnosis not present

## 2020-09-27 DIAGNOSIS — Z79899 Other long term (current) drug therapy: Secondary | ICD-10-CM | POA: Diagnosis not present

## 2020-09-27 DIAGNOSIS — E861 Hypovolemia: Secondary | ICD-10-CM | POA: Diagnosis not present

## 2020-09-27 DIAGNOSIS — H548 Legal blindness, as defined in USA: Secondary | ICD-10-CM | POA: Diagnosis present

## 2020-09-27 DIAGNOSIS — U071 COVID-19: Principal | ICD-10-CM | POA: Diagnosis present

## 2020-09-27 DIAGNOSIS — I10 Essential (primary) hypertension: Secondary | ICD-10-CM | POA: Diagnosis present

## 2020-09-27 DIAGNOSIS — F410 Panic disorder [episodic paroxysmal anxiety] without agoraphobia: Secondary | ICD-10-CM | POA: Diagnosis present

## 2020-09-27 DIAGNOSIS — E876 Hypokalemia: Secondary | ICD-10-CM | POA: Diagnosis present

## 2020-09-27 DIAGNOSIS — R296 Repeated falls: Secondary | ICD-10-CM | POA: Diagnosis present

## 2020-09-27 DIAGNOSIS — I959 Hypotension, unspecified: Secondary | ICD-10-CM | POA: Diagnosis present

## 2020-09-27 DIAGNOSIS — E869 Volume depletion, unspecified: Secondary | ICD-10-CM | POA: Diagnosis present

## 2020-09-27 DIAGNOSIS — Z886 Allergy status to analgesic agent status: Secondary | ICD-10-CM | POA: Diagnosis not present

## 2020-09-27 DIAGNOSIS — R531 Weakness: Secondary | ICD-10-CM | POA: Diagnosis not present

## 2020-09-27 DIAGNOSIS — K219 Gastro-esophageal reflux disease without esophagitis: Secondary | ICD-10-CM | POA: Diagnosis present

## 2020-09-27 DIAGNOSIS — R4182 Altered mental status, unspecified: Secondary | ICD-10-CM | POA: Diagnosis not present

## 2020-09-27 DIAGNOSIS — Z888 Allergy status to other drugs, medicaments and biological substances status: Secondary | ICD-10-CM

## 2020-09-27 DIAGNOSIS — Z833 Family history of diabetes mellitus: Secondary | ICD-10-CM | POA: Diagnosis not present

## 2020-09-27 DIAGNOSIS — G894 Chronic pain syndrome: Secondary | ICD-10-CM | POA: Diagnosis present

## 2020-09-27 DIAGNOSIS — W19XXXA Unspecified fall, initial encounter: Secondary | ICD-10-CM | POA: Diagnosis present

## 2020-09-27 DIAGNOSIS — F32A Depression, unspecified: Secondary | ICD-10-CM | POA: Diagnosis present

## 2020-09-27 DIAGNOSIS — Z8711 Personal history of peptic ulcer disease: Secondary | ICD-10-CM

## 2020-09-27 DIAGNOSIS — Z7982 Long term (current) use of aspirin: Secondary | ICD-10-CM

## 2020-09-27 DIAGNOSIS — Z8042 Family history of malignant neoplasm of prostate: Secondary | ICD-10-CM | POA: Diagnosis not present

## 2020-09-27 DIAGNOSIS — Z87891 Personal history of nicotine dependence: Secondary | ICD-10-CM | POA: Diagnosis not present

## 2020-09-27 DIAGNOSIS — I951 Orthostatic hypotension: Secondary | ICD-10-CM | POA: Diagnosis present

## 2020-09-27 DIAGNOSIS — G629 Polyneuropathy, unspecified: Secondary | ICD-10-CM | POA: Diagnosis present

## 2020-09-27 DIAGNOSIS — E7800 Pure hypercholesterolemia, unspecified: Secondary | ICD-10-CM | POA: Diagnosis present

## 2020-09-27 DIAGNOSIS — I9589 Other hypotension: Secondary | ICD-10-CM | POA: Diagnosis not present

## 2020-09-27 DIAGNOSIS — Z885 Allergy status to narcotic agent status: Secondary | ICD-10-CM | POA: Diagnosis not present

## 2020-09-27 DIAGNOSIS — S199XXA Unspecified injury of neck, initial encounter: Secondary | ICD-10-CM | POA: Diagnosis not present

## 2020-09-27 LAB — CBC WITH DIFFERENTIAL/PLATELET
Abs Immature Granulocytes: 0.02 10*3/uL (ref 0.00–0.07)
Basophils Absolute: 0 10*3/uL (ref 0.0–0.1)
Basophils Relative: 0 %
Eosinophils Absolute: 0.1 10*3/uL (ref 0.0–0.5)
Eosinophils Relative: 1 %
HCT: 34.8 % — ABNORMAL LOW (ref 36.0–46.0)
Hemoglobin: 11.5 g/dL — ABNORMAL LOW (ref 12.0–15.0)
Immature Granulocytes: 0 %
Lymphocytes Relative: 25 %
Lymphs Abs: 1.8 10*3/uL (ref 0.7–4.0)
MCH: 29.9 pg (ref 26.0–34.0)
MCHC: 33 g/dL (ref 30.0–36.0)
MCV: 90.4 fL (ref 80.0–100.0)
Monocytes Absolute: 0.6 10*3/uL (ref 0.1–1.0)
Monocytes Relative: 9 %
Neutro Abs: 4.5 10*3/uL (ref 1.7–7.7)
Neutrophils Relative %: 65 %
Platelets: 269 10*3/uL (ref 150–400)
RBC: 3.85 MIL/uL — ABNORMAL LOW (ref 3.87–5.11)
RDW: 13.2 % (ref 11.5–15.5)
WBC: 7 10*3/uL (ref 4.0–10.5)
nRBC: 0 % (ref 0.0–0.2)

## 2020-09-27 MED ORDER — LACTATED RINGERS IV BOLUS
2000.0000 mL | Freq: Once | INTRAVENOUS | Status: AC
  Administered 2020-09-27: 2000 mL via INTRAVENOUS
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
## 2020-09-27 NOTE — ED Triage Notes (Signed)
From by [facility] for Falls, history of seizures, blindness, previous falls

## 2020-09-28 ENCOUNTER — Encounter: Payer: Self-pay | Admitting: Internal Medicine

## 2020-09-28 DIAGNOSIS — Z87891 Personal history of nicotine dependence: Secondary | ICD-10-CM | POA: Diagnosis not present

## 2020-09-28 DIAGNOSIS — Z833 Family history of diabetes mellitus: Secondary | ICD-10-CM | POA: Diagnosis not present

## 2020-09-28 DIAGNOSIS — U071 COVID-19: Secondary | ICD-10-CM | POA: Diagnosis present

## 2020-09-28 DIAGNOSIS — E876 Hypokalemia: Secondary | ICD-10-CM

## 2020-09-28 DIAGNOSIS — Z8711 Personal history of peptic ulcer disease: Secondary | ICD-10-CM | POA: Diagnosis not present

## 2020-09-28 DIAGNOSIS — E861 Hypovolemia: Secondary | ICD-10-CM | POA: Diagnosis not present

## 2020-09-28 DIAGNOSIS — Z79899 Other long term (current) drug therapy: Secondary | ICD-10-CM | POA: Diagnosis not present

## 2020-09-28 DIAGNOSIS — I9589 Other hypotension: Secondary | ICD-10-CM

## 2020-09-28 DIAGNOSIS — R296 Repeated falls: Secondary | ICD-10-CM | POA: Diagnosis present

## 2020-09-28 DIAGNOSIS — I951 Orthostatic hypotension: Secondary | ICD-10-CM | POA: Diagnosis present

## 2020-09-28 DIAGNOSIS — K219 Gastro-esophageal reflux disease without esophagitis: Secondary | ICD-10-CM | POA: Diagnosis present

## 2020-09-28 DIAGNOSIS — Z8249 Family history of ischemic heart disease and other diseases of the circulatory system: Secondary | ICD-10-CM | POA: Diagnosis not present

## 2020-09-28 DIAGNOSIS — Z888 Allergy status to other drugs, medicaments and biological substances status: Secondary | ICD-10-CM | POA: Diagnosis not present

## 2020-09-28 DIAGNOSIS — Z8042 Family history of malignant neoplasm of prostate: Secondary | ICD-10-CM | POA: Diagnosis not present

## 2020-09-28 DIAGNOSIS — W19XXXA Unspecified fall, initial encounter: Secondary | ICD-10-CM | POA: Diagnosis present

## 2020-09-28 DIAGNOSIS — I959 Hypotension, unspecified: Secondary | ICD-10-CM

## 2020-09-28 DIAGNOSIS — G894 Chronic pain syndrome: Secondary | ICD-10-CM | POA: Diagnosis present

## 2020-09-28 DIAGNOSIS — Z7982 Long term (current) use of aspirin: Secondary | ICD-10-CM | POA: Diagnosis not present

## 2020-09-28 DIAGNOSIS — G629 Polyneuropathy, unspecified: Secondary | ICD-10-CM | POA: Diagnosis present

## 2020-09-28 DIAGNOSIS — F410 Panic disorder [episodic paroxysmal anxiety] without agoraphobia: Secondary | ICD-10-CM | POA: Diagnosis present

## 2020-09-28 DIAGNOSIS — E7800 Pure hypercholesterolemia, unspecified: Secondary | ICD-10-CM | POA: Diagnosis present

## 2020-09-28 DIAGNOSIS — H548 Legal blindness, as defined in USA: Secondary | ICD-10-CM | POA: Diagnosis present

## 2020-09-28 DIAGNOSIS — Z885 Allergy status to narcotic agent status: Secondary | ICD-10-CM | POA: Diagnosis not present

## 2020-09-28 DIAGNOSIS — F32A Depression, unspecified: Secondary | ICD-10-CM | POA: Diagnosis present

## 2020-09-28 DIAGNOSIS — I10 Essential (primary) hypertension: Secondary | ICD-10-CM | POA: Diagnosis present

## 2020-09-28 DIAGNOSIS — Z886 Allergy status to analgesic agent status: Secondary | ICD-10-CM | POA: Diagnosis not present

## 2020-09-28 DIAGNOSIS — E869 Volume depletion, unspecified: Secondary | ICD-10-CM | POA: Diagnosis present

## 2020-09-28 LAB — URINE DRUG SCREEN, QUALITATIVE (ARMC ONLY)
Amphetamines, Ur Screen: NOT DETECTED
Barbiturates, Ur Screen: NOT DETECTED
Benzodiazepine, Ur Scrn: NOT DETECTED
Cannabinoid 50 Ng, Ur ~~LOC~~: NOT DETECTED
Cocaine Metabolite,Ur ~~LOC~~: NOT DETECTED
MDMA (Ecstasy)Ur Screen: NOT DETECTED
Methadone Scn, Ur: NOT DETECTED
Opiate, Ur Screen: POSITIVE — AB
Phencyclidine (PCP) Ur S: NOT DETECTED
Tricyclic, Ur Screen: POSITIVE — AB

## 2020-09-28 LAB — COMPREHENSIVE METABOLIC PANEL
ALT: 91 U/L — ABNORMAL HIGH (ref 0–44)
AST: 166 U/L — ABNORMAL HIGH (ref 15–41)
Albumin: 4.2 g/dL (ref 3.5–5.0)
Alkaline Phosphatase: 98 U/L (ref 38–126)
Anion gap: 6 (ref 5–15)
BUN: 13 mg/dL (ref 8–23)
CO2: 25 mmol/L (ref 22–32)
Calcium: 8.4 mg/dL — ABNORMAL LOW (ref 8.9–10.3)
Chloride: 108 mmol/L (ref 98–111)
Creatinine, Ser: 0.74 mg/dL (ref 0.44–1.00)
GFR, Estimated: >60 mL/min (ref >60)
Glucose, Bld: 121 mg/dL — ABNORMAL HIGH (ref 70–99)
Potassium: 3.4 mmol/L — ABNORMAL LOW (ref 3.5–5.1)
Sodium: 139 mmol/L (ref 135–145)
Total Bilirubin: 0.7 mg/dL (ref 0.3–1.2)
Total Protein: 6.8 g/dL (ref 6.5–8.1)

## 2020-09-28 LAB — FIBRINOGEN: Fibrinogen: 353 mg/dL (ref 210–475)

## 2020-09-28 LAB — URINALYSIS, COMPLETE (UACMP) WITH MICROSCOPIC
Bacteria, UA: NONE SEEN
Bilirubin Urine: NEGATIVE
Glucose, UA: NEGATIVE mg/dL
Hgb urine dipstick: NEGATIVE
Ketones, ur: NEGATIVE mg/dL
Leukocytes,Ua: NEGATIVE
Nitrite: NEGATIVE
Protein, ur: NEGATIVE mg/dL
Specific Gravity, Urine: 1.015 (ref 1.005–1.030)
pH: 6 (ref 5.0–8.0)

## 2020-09-28 LAB — LACTATE DEHYDROGENASE: LDH: 178 U/L (ref 98–192)

## 2020-09-28 LAB — RESP PANEL BY RT-PCR (FLU A&B, COVID) ARPGX2
Influenza A by PCR: NEGATIVE
Influenza B by PCR: NEGATIVE
SARS Coronavirus 2 by RT PCR: POSITIVE — AB

## 2020-09-28 LAB — BRAIN NATRIURETIC PEPTIDE: B Natriuretic Peptide: 39.2 pg/mL (ref 0.0–100.0)

## 2020-09-28 LAB — PROCALCITONIN
Procalcitonin: <0.1 ng/mL
Procalcitonin: <0.1 ng/mL

## 2020-09-28 LAB — TROPONIN I (HIGH SENSITIVITY)
Troponin I (High Sensitivity): 5 ng/L (ref <18)
Troponin I (High Sensitivity): 5 ng/L (ref <18)

## 2020-09-28 LAB — ETHANOL: Alcohol, Ethyl (B): <10 mg/dL (ref <10)

## 2020-09-28 LAB — D-DIMER, QUANTITATIVE: D-Dimer, Quant: <0.27 ug/mL-FEU (ref 0.00–0.50)

## 2020-09-28 LAB — AMMONIA: Ammonia: 31 umol/L (ref 9–35)

## 2020-09-28 LAB — PROTIME-INR
INR: 1.1 (ref 0.8–1.2)
Prothrombin Time: 13.8 seconds (ref 11.4–15.2)

## 2020-09-28 LAB — LACTIC ACID, PLASMA: Lactic Acid, Venous: 1.1 mmol/L (ref 0.5–1.9)

## 2020-09-28 LAB — C-REACTIVE PROTEIN: CRP: 1.3 mg/dL — ABNORMAL HIGH (ref <1.0)

## 2020-09-28 LAB — MAGNESIUM: Magnesium: 2 mg/dL (ref 1.7–2.4)

## 2020-09-28 LAB — HIV ANTIBODY (ROUTINE TESTING W REFLEX): HIV Screen 4th Generation wRfx: NONREACTIVE

## 2020-09-28 LAB — FERRITIN: Ferritin: 48 ng/mL (ref 11–307)

## 2020-09-28 MED ORDER — ASPIRIN 81 MG PO CHEW
81.0000 mg | CHEWABLE_TABLET | Freq: Every day | ORAL | Status: DC
  Administered 2020-09-28 – 2020-09-30 (×3): 81 mg via ORAL
  Filled 2020-09-28 (×3): qty 1

## 2020-09-28 MED ORDER — MAGNESIUM HYDROXIDE 400 MG/5ML PO SUSP: 30.0000 mL | Freq: Every day | ORAL | Status: DC | PRN

## 2020-09-28 MED ORDER — GABAPENTIN 400 MG PO CAPS
400.0000 mg | ORAL_CAPSULE | Freq: Three times a day (TID) | ORAL | Status: DC
  Administered 2020-09-28 – 2020-09-30 (×7): 400 mg via ORAL
  Filled 2020-09-28 (×7): qty 1

## 2020-09-28 MED ORDER — PROMETHAZINE HCL 25 MG PO TABS
25.0000 mg | ORAL_TABLET | ORAL | Status: DC | PRN
  Administered 2020-09-30: 25 mg via ORAL
  Filled 2020-09-28: qty 1

## 2020-09-28 MED ORDER — TRAMADOL HCL 50 MG PO TABS: 50.0000 mg | ORAL_TABLET | Freq: Two times a day (BID) | ORAL | Status: DC

## 2020-09-28 MED ORDER — LEVOCETIRIZINE DIHYDROCHLORIDE 5 MG PO TABS: 5.0000 mg | ORAL_TABLET | Freq: Every evening | ORAL | Status: DC

## 2020-09-28 MED ORDER — SIMVASTATIN 20 MG PO TABS
20.0000 mg | ORAL_TABLET | Freq: Every day | ORAL | Status: DC
  Administered 2020-09-28 – 2020-09-29 (×2): 20 mg via ORAL
  Filled 2020-09-28 (×4): qty 1

## 2020-09-28 MED ORDER — FAMOTIDINE 20 MG PO TABS
40.0000 mg | ORAL_TABLET | Freq: Every day | ORAL | Status: DC
  Administered 2020-09-28 – 2020-09-29 (×2): 40 mg via ORAL
  Filled 2020-09-28 (×2): qty 2

## 2020-09-28 MED ORDER — AMLODIPINE BESYLATE 5 MG PO TABS
5.0000 mg | ORAL_TABLET | Freq: Every day | ORAL | Status: DC
  Administered 2020-09-28 – 2020-09-30 (×3): 5 mg via ORAL
  Filled 2020-09-28 (×3): qty 1

## 2020-09-28 MED ORDER — SUCRALFATE 1 G PO TABS
1.0000 g | ORAL_TABLET | Freq: Two times a day (BID) | ORAL | Status: DC
  Administered 2020-09-29 – 2020-09-30 (×3): 1 g via ORAL
  Filled 2020-09-28 (×3): qty 1

## 2020-09-28 MED ORDER — LORATADINE 10 MG PO TABS: 10.0000 mg | ORAL_TABLET | Freq: Every day | ORAL | Status: DC

## 2020-09-28 MED ORDER — MELATONIN 5 MG PO TABS
10.0000 mg | ORAL_TABLET | Freq: Every evening | ORAL | Status: DC | PRN
  Administered 2020-09-30: 10 mg via ORAL
  Filled 2020-09-28: qty 2

## 2020-09-28 MED ORDER — ONDANSETRON 4 MG PO TBDP: 4.0000 mg | ORAL_TABLET | Freq: Three times a day (TID) | ORAL | Status: DC | PRN

## 2020-09-28 MED ORDER — MENTHOL 3 MG MT LOZG
1.0000 | LOZENGE | OROMUCOSAL | Status: DC | PRN
  Administered 2020-09-30: 3 mg via ORAL
  Filled 2020-09-28 (×2): qty 9

## 2020-09-28 MED ORDER — ASCORBIC ACID 500 MG PO TABS
1000.0000 mg | ORAL_TABLET | Freq: Every day | ORAL | Status: DC
  Administered 2020-09-28 – 2020-09-30 (×3): 1000 mg via ORAL
  Filled 2020-09-28 (×3): qty 2

## 2020-09-28 MED ORDER — VITAMIN D 25 MCG (1000 UNIT) PO TABS
1000.0000 [IU] | ORAL_TABLET | Freq: Every day | ORAL | Status: DC
  Administered 2020-09-28 – 2020-09-30 (×3): 1000 [IU] via ORAL
  Filled 2020-09-28 (×3): qty 1

## 2020-09-28 MED ORDER — MONTELUKAST SODIUM 10 MG PO TABS
10.0000 mg | ORAL_TABLET | Freq: Every day | ORAL | Status: DC
  Administered 2020-09-28 – 2020-09-29 (×2): 10 mg via ORAL
  Filled 2020-09-28 (×2): qty 1

## 2020-09-28 MED ORDER — TEMAZEPAM 15 MG PO CAPS
15.0000 mg | ORAL_CAPSULE | Freq: Every day | ORAL | Status: DC
  Administered 2020-09-28 – 2020-09-29 (×2): 15 mg via ORAL
  Filled 2020-09-28 (×2): qty 1

## 2020-09-28 MED ORDER — GUAIFENESIN ER 600 MG PO TB12
600.0000 mg | ORAL_TABLET | Freq: Two times a day (BID) | ORAL | Status: DC
  Administered 2020-09-28 – 2020-09-30 (×4): 600 mg via ORAL
  Filled 2020-09-28 (×6): qty 1

## 2020-09-28 MED ORDER — SODIUM CHLORIDE 0.9 % IV SOLN
200.0000 mg | Freq: Once | INTRAVENOUS | Status: DC
  Filled 2020-09-28: qty 40

## 2020-09-28 MED ORDER — ENOXAPARIN SODIUM 40 MG/0.4ML IJ SOSY
40.0000 mg | PREFILLED_SYRINGE | INTRAMUSCULAR | Status: DC
  Administered 2020-09-28 – 2020-09-29 (×2): 40 mg via SUBCUTANEOUS
  Filled 2020-09-28 (×3): qty 0.4

## 2020-09-28 MED ORDER — ONDANSETRON HCL 4 MG/2ML IJ SOLN
4.0000 mg | Freq: Four times a day (QID) | INTRAMUSCULAR | Status: DC | PRN
  Administered 2020-09-28: 4 mg via INTRAVENOUS
  Filled 2020-09-28: qty 2

## 2020-09-28 MED ORDER — SODIUM CHLORIDE 0.9 % IV SOLN: 100.0000 mg | Freq: Every day | INTRAVENOUS | Status: DC

## 2020-09-28 MED ORDER — MECLIZINE HCL 25 MG PO TABS
25.0000 mg | ORAL_TABLET | Freq: Three times a day (TID) | ORAL | Status: DC | PRN
  Filled 2020-09-28: qty 1

## 2020-09-28 MED ORDER — LORATADINE 10 MG PO TABS
10.0000 mg | ORAL_TABLET | Freq: Every evening | ORAL | Status: DC
  Administered 2020-09-29: 10 mg via ORAL
  Filled 2020-09-28: qty 1

## 2020-09-28 MED ORDER — PANTOPRAZOLE SODIUM 40 MG PO TBEC
40.0000 mg | DELAYED_RELEASE_TABLET | Freq: Every day | ORAL | Status: DC
  Administered 2020-09-28 – 2020-09-30 (×3): 40 mg via ORAL
  Filled 2020-09-28 (×3): qty 1

## 2020-09-28 MED ORDER — CYANOCOBALAMIN 500 MCG PO TABS
500.0000 ug | ORAL_TABLET | Freq: Every day | ORAL | Status: DC
  Administered 2020-09-29 – 2020-09-30 (×2): 500 ug via ORAL
  Filled 2020-09-28 (×3): qty 1

## 2020-09-28 MED ORDER — SODIUM CHLORIDE 0.9 % IV SOLN
100.0000 mg | Freq: Every day | INTRAVENOUS | Status: DC
  Administered 2020-09-29 – 2020-09-30 (×2): 100 mg via INTRAVENOUS
  Filled 2020-09-28: qty 20
  Filled 2020-09-28: qty 100
  Filled 2020-09-28 (×2): qty 20

## 2020-09-28 MED ORDER — OXYMETAZOLINE HCL 0.05 % NA SOLN
1.0000 | Freq: Two times a day (BID) | NASAL | Status: DC
  Administered 2020-09-28 – 2020-09-29 (×3): 1 via NASAL
  Filled 2020-09-28: qty 30

## 2020-09-28 MED ORDER — SODIUM CHLORIDE 0.9 % IV SOLN
100.0000 mg | Freq: Once | INTRAVENOUS | Status: AC
  Administered 2020-09-28: 100 mg via INTRAVENOUS
  Filled 2020-09-28: qty 20

## 2020-09-28 MED ORDER — ACETAMINOPHEN 325 MG PO TABS
650.0000 mg | ORAL_TABLET | Freq: Four times a day (QID) | ORAL | Status: DC | PRN
  Administered 2020-09-29 – 2020-09-30 (×4): 650 mg via ORAL
  Filled 2020-09-28 (×4): qty 2

## 2020-09-28 MED ORDER — DEXTROMETHORPHAN-GUAIFENESIN 10-200 MG PO CAPS: 15.0000 mL | ORAL_CAPSULE | Freq: Every day | ORAL | Status: DC | PRN

## 2020-09-28 MED ORDER — SODIUM CHLORIDE 0.9 % IV SOLN: INTRAVENOUS | Status: DC

## 2020-09-28 MED ORDER — ALBUTEROL SULFATE HFA 108 (90 BASE) MCG/ACT IN AERS
2.0000 | INHALATION_SPRAY | Freq: Four times a day (QID) | RESPIRATORY_TRACT | Status: DC | PRN
  Filled 2020-09-28: qty 6.7

## 2020-09-28 MED ORDER — POTASSIUM CHLORIDE 20 MEQ PO PACK
40.0000 meq | PACK | Freq: Once | ORAL | Status: AC
  Administered 2020-09-28: 40 meq via ORAL
  Filled 2020-09-28: qty 2

## 2020-09-28 MED ORDER — ZINC SULFATE 220 (50 ZN) MG PO CAPS
220.0000 mg | ORAL_CAPSULE | Freq: Every day | ORAL | Status: DC
  Administered 2020-09-28 – 2020-09-30 (×3): 220 mg via ORAL
  Filled 2020-09-28 (×3): qty 1

## 2020-09-28 MED ORDER — ONDANSETRON HCL 4 MG PO TABS: 4.0000 mg | ORAL_TABLET | Freq: Four times a day (QID) | ORAL | Status: DC | PRN

## 2020-09-28 MED ORDER — POTASSIUM CHLORIDE IN NACL 20-0.9 MEQ/L-% IV SOLN
INTRAVENOUS | Status: DC
  Filled 2020-09-28 (×5): qty 1000

## 2020-09-28 MED ORDER — FLUTICASONE PROPIONATE 50 MCG/ACT NA SUSP
2.0000 | Freq: Every day | NASAL | Status: DC
  Administered 2020-09-29: 2 via NASAL
  Filled 2020-09-28: qty 16

## 2020-09-28 MED ORDER — CYCLOBENZAPRINE HCL 10 MG PO TABS
10.0000 mg | ORAL_TABLET | Freq: Three times a day (TID) | ORAL | Status: DC | PRN
  Administered 2020-09-28 – 2020-09-29 (×2): 10 mg via ORAL
  Filled 2020-09-28 (×2): qty 1

## 2020-09-28 MED ORDER — GUAIFENESIN-DM 100-10 MG/5ML PO SYRP
10.0000 mL | ORAL_SOLUTION | Freq: Every day | ORAL | Status: DC | PRN
  Administered 2020-09-29 (×2): 10 mL via ORAL
  Filled 2020-09-28 (×2): qty 10

## 2020-09-28 MED ORDER — HYDROXYZINE HCL 25 MG PO TABS
25.0000 mg | ORAL_TABLET | Freq: Three times a day (TID) | ORAL | Status: DC | PRN
  Administered 2020-09-29: 25 mg via ORAL
  Filled 2020-09-28: qty 1

## 2020-09-28 MED ORDER — SODIUM CHLORIDE 0.9 % IV SOLN: 200.0000 mg | Freq: Once | INTRAVENOUS | Status: DC

## 2020-09-28 NOTE — ED Notes (Signed)
Pt feeling nauseous and spitting up. IV zofran will be given.

## 2020-09-28 NOTE — Evaluation (Signed)
Occupational Therapy Evaluation Patient Details Name: Sandra [name] MRN: [id number] DOB: 07/16/1955 Today's Date: 09/28/2020    History of Present Illness 65 y.o. female with PMHx of chronic gait disorder with multiple frequent falls, anxiety/depression, HTN, HLD who presented with worsening weakness, cough, nasal congestion and frequent falls.  She recently had exposure to COVID-19.  Upon further evaluation-she was found to have COVID-19 infection-and hypotension due to volume depletion.   Clinical Impression   Patient presenting with decreased I in self care, balance, functional mobility/transfer, endurance, and safety awareness. Patient reports being legally blind and living with boyfriend at baseline. She uses white sight cane outside of community PTA. Pt's boyfriend assists with IADL tasks. Pt performs self care and functional mobility at supervision overall. Patient will benefit from acute OT to increase overall independence in the areas of ADLs, functional mobility, and safety awareness in order to safely discharge home with family.    Follow Up Recommendations  No OT follow up;Supervision/Assistance - 24 hour    Equipment Recommendations  None recommended by OT       Precautions / Restrictions Precautions Precautions: Fall      Mobility Bed Mobility Overal bed mobility: Modified Independent                  Transfers Overall transfer level: Modified independent Equipment used: None             General transfer comment: Pt required close supervision to step down from ED stretcher, ambulating short distance in room, and return to bed.    Balance Overall balance assessment: Needs assistance Sitting-balance support: Feet supported;Bilateral upper extremity supported Sitting balance-Leahy Scale: Good     Standing balance support: During functional activity;Single extremity supported Standing balance-Leahy Scale: Fair                              ADL either performed or assessed with clinical judgement   ADL Overall ADL's : Needs assistance/impaired                                       General ADL Comments: Pt needing set up A - close supervison for self care tasks and functional mobility. Pt also needing  cuing secondary to visual impairment and being in unfamiliar environment.     Vision Baseline Vision/History: Legally blind Patient Visual Report: No change from baseline Additional Comments: Pt reports seeing shadows only.            Pertinent Vitals/Pain Pain Assessment: No/denies pain     Hand Dominance Right   Extremity/Trunk Assessment Upper Extremity Assessment Upper Extremity Assessment: Overall WFL for tasks assessed   Lower Extremity Assessment Lower Extremity Assessment: Overall WFL for tasks assessed   Cervical / Trunk Assessment Cervical / Trunk Assessment: Normal   Communication Communication Communication: No difficulties   Cognition Arousal/Alertness: Awake/alert Behavior During Therapy: WFL for tasks assessed/performed Overall Cognitive Status: Within Functional Limits for tasks assessed                                 General Comments: Pt is pleasant and cooperative. A & O x4.              Home Living Family/patient expects to be discharged to:: Private residence Living Arrangements: Non-relatives/Friends Available  Help at Discharge: Family;Available 24 hours/day Type of Home: House Home Access: Stairs to enter [employer] of Steps: 2 Entrance Stairs-Rails: Right;Left Home Layout: One level     Bathroom Shower/Tub: Teacher, early years/pre: Standard     Home Equipment: Civil engineer, contracting;Other (comment)   Additional Comments: pt ambulates with white sight cane      Prior Functioning/Environment Level of Independence: Independent with assistive device(s)        Comments: Pt ambulates with white sight cane in community and  reports assist from boyfriend to get onto shower seat for bathing. She washes and dresses herself. He goes to grocery store and assists with IADL tasks.        OT Problem List: Decreased strength;Decreased activity tolerance;Impaired balance (sitting and/or standing);Decreased safety awareness      OT Treatment/Interventions: Self-care/ADL training;Manual therapy;Therapeutic exercise;Patient/family education;Balance training;Energy conservation;Therapeutic activities;DME and/or AE instruction    OT Goals(Current goals can be found in the care plan section) Acute Rehab OT Goals Patient Stated Goal: to feel better and go home OT Goal Formulation: With patient Time For Goal Achievement: 10/12/20 Potential to Achieve Goals: Good ADL Goals Pt Will Perform Grooming: with modified independence;standing Pt Will Perform Lower Body Dressing: with modified independence;sit to/from stand Pt Will Transfer to Toilet: with modified independence;ambulating Pt Will Perform Toileting - Clothing Manipulation and hygiene: with modified independence;sit to/from stand  OT Frequency: Min 2X/week   Barriers to D/C:    none known at this time          AM-PAC OT "6 Clicks" Daily Activity     Outcome Measure Help from another person eating meals?: A Little Help from another person taking care of personal grooming?: A Little Help from another person toileting, which includes using toliet, bedpan, or urinal?: A Little Help from another person bathing (including washing, rinsing, drying)?: A Little Help from another person to put on and taking off regular upper body clothing?: A Little Help from another person to put on and taking off regular lower body clothing?: A Little 6 Click Score: 18   End of Session Nurse Communication: Mobility status;Precautions  Activity Tolerance: Other (comment) (Pt is nauseated throughout session) Patient left: in bed;with call bell/phone within reach;with bed alarm  set  OT Visit Diagnosis: Unsteadiness on feet (R26.81);Repeated falls (R29.6);Muscle weakness (generalized) (M62.81)                Time: [id number] OT Time Calculation (min): 22 min Charges:  OT General Charges $OT Visit: 1 Visit OT Evaluation $OT Eval Low Complexity: 1 Low OT Treatments $Therapeutic Activity: 8-22 mins  Darleen Crocker, MS, OTR/L , CBIS ascom [phone]  09/28/20, 3:22 PM

## 2020-09-28 NOTE — Progress Notes (Signed)
PROGRESS NOTE                                                                                                                                                                                                             Patient Demographics:    Sandra [name], is a 65 y.o. female, DOB - 1956-01-14, [id number]  Outpatient Primary MD for the patient is Baxter Hire, MD   Admit date - 09/27/2020   LOS - 0  Chief Complaint  Patient presents with   Fall       Brief Narrative: Patient is a 65 y.o. female with PMHx of chronic gait disorder with multiple frequent falls, anxiety/depression, HTN, HLD who presented with worsening weakness, cough, nasal congestion and frequent falls.  She recently had exposure to COVID-19.  Upon further evaluation-she was found to have COVID-19 infection-and hypotension due to volume depletion.  She was subsequently admitted to the hospitalist service.  See below for further details.   COVID-19 vaccinated status: Vaccinated but not boosted.   Significant Events: 8/10>> admit to [facility] for COVID-19 infection-hypotension-severe generalized weakness.  Significant studies: 8/10>> CXR: No pneumonia 8/11>> CT head: No acute abnormalities 8/11>> CT C-spine: No fractures  COVID-19 medications: Remdesivir: 8/10>>  Antibiotics: None  Microbiology data: 8/10 >>blood culture: No growth 8/10>> urine culture: Pending  Procedures: None  Consults: None  DVT prophylaxis: enoxaparin (LOVENOX) injection 40 mg Start: 09/28/20 0800    Subjective:    [name] today continues to feel weak-complains of nasal congestion and cough.   Assessment  & Plan :   COVID-19 infection: No hypoxia-now pneumonia on CXR-but at risk for severe disease-plan on Remdesivir x3 days.  Watch closely.  Hypotension: Likely due to volume depletion-improved with IVF.  Check orthostatic vital signs when  able.  HTN: Cautiously continue with amlodipine-now that BP has improved.  Follow and adjust.  HLD: Continue statin  Peripheral neuropathy/chronic gait disorder/frequent falls: Continue Neurontin-await PT/OT evaluation  GERD: Continue PPI   COVID-19 Labs: Recent Labs    09/27/20 2344 09/28/20 0703  DDIMER <0.27  --   FERRITIN 48  --   LDH  --  178       Component Value Date/Time   BNP 39.2 09/27/2020 2324    Recent Labs  Lab 09/27/20 2324 09/27/20 2344  PROCALCITON <0.10 <0.10  Lab Results  Component Value Date   SARSCOV2NAA POSITIVE (A) 09/27/2020     ABG: No results found for: PHART, PCO2ART, PO2ART, HCO3, TCO2, ACIDBASEDEF, O2SAT  Vent Settings: N/A   Condition - Stable  Family Communication  :  Daughter-[phone] updated over the phone on 8/11  Code Status :  Full Code  Diet :  Diet Order             Diet Heart Room service appropriate? Yes; Fluid consistency: Thin  Diet effective now                    Disposition Plan  :   Status is: Observation  The patient will require care spanning > 2 midnights and should be moved to inpatient because: Inpatient level of care appropriate due to severity of illness  Dispo: The patient is from: Home              Anticipated d/c is to: Home              Patient currently is not medically stable to d/c.   Difficult to place patient No    Barriers to discharge: At risk for severe disease with COVID-19-needs 3 days of IV Remdesivir-very debilitated-needs PT/OT eval for safe disposition as well.  Antimicorbials  :    Anti-infectives (From admission, onward)    Start     Dose/Rate Route Frequency Ordered Stop   09/29/20 1000  remdesivir 100 mg in sodium chloride 0.9 % 100 mL IVPB  Status:  Discontinued       See Hyperspace for full Linked Orders Report.   100 mg 200 mL/hr over 30 Minutes Intravenous Daily 09/28/20 0343 09/28/20 0346   09/29/20 1000  remdesivir 100 mg in sodium chloride  0.9 % 100 mL IVPB       See Hyperspace for full Linked Orders Report.   100 mg 200 mL/hr over 30 Minutes Intravenous Daily 09/28/20 0349 10/03/20 0959   09/29/20 1000  remdesivir 100 mg in sodium chloride 0.9 % 100 mL IVPB  Status:  Discontinued       See Hyperspace for full Linked Orders Report.   100 mg 200 mL/hr over 30 Minutes Intravenous Daily 09/28/20 0406 09/28/20 0414   09/28/20 0530  remdesivir 200 mg in sodium chloride 0.9% 250 mL IVPB  Status:  Discontinued       See Hyperspace for full Linked Orders Report.   200 mg 580 mL/hr over 30 Minutes Intravenous Once 09/28/20 0343 09/28/20 0346   09/28/20 0530  remdesivir 100 mg in sodium chloride 0.9 % 100 mL IVPB       See Hyperspace for full Linked Orders Report.   100 mg 200 mL/hr over 30 Minutes Intravenous  Once 09/28/20 0349 09/28/20 0530   09/28/20 0500  remdesivir 100 mg in sodium chloride 0.9 % 100 mL IVPB       See Hyperspace for full Linked Orders Report.   100 mg 200 mL/hr over 30 Minutes Intravenous  Once 09/28/20 0349 09/28/20 0502   09/28/20 0415  remdesivir 200 mg in sodium chloride 0.9% 250 mL IVPB  Status:  Discontinued       See Hyperspace for full Linked Orders Report.   200 mg 580 mL/hr over 30 Minutes Intravenous Once 09/28/20 0406 09/28/20 0414       Inpatient Medications  Scheduled Meds:  amLODipine  5 mg Oral Daily   vitamin C  1,000 mg Oral Daily  aspirin  81 mg Oral Daily   cholecalciferol  1,000 Units Oral Daily   enoxaparin (LOVENOX) injection  40 mg Subcutaneous Q24H   famotidine  40 mg Oral QHS   fluticasone  2 spray Each Nare Daily   gabapentin  400 mg Oral BID   guaiFENesin  600 mg Oral BID   levocetirizine  5 mg Oral QPM   montelukast  10 mg Oral QHS   oxymetazoline  1 spray Each Nare BID   pantoprazole  40 mg Oral Daily   simvastatin  20 mg Oral QHS   sucralfate  1 g Oral BID WC   temazepam  15 mg Oral QHS   [START ON 10/05/2020] traMADol  100 mg Oral Daily   vitamin B-12  500 mcg  Oral Daily   zinc sulfate  220 mg Oral Daily   Continuous Infusions:  0.9 % NaCl with KCl 20 mEq / L 100 mL/hr at 09/28/20 0524   [START ON 09/29/2020] remdesivir 100 mg in NS 100 mL     PRN Meds:.acetaminophen, albuterol, cyclobenzaprine, guaiFENesin-dextromethorphan, hydrOXYzine, magnesium hydroxide, meclizine, Melatonin, menthol-cetylpyridinium, ondansetron **OR** ondansetron (ZOFRAN) IV, ondansetron, promethazine   Time Spent in minutes  25   See all Orders from today for further details   Oren Binet M.D on 09/28/2020 at 11:08 AM  To page go to [email or web address] - use universal password  [facility] -  Office  [phone]    Objective:   Vitals:   09/28/20 0600 09/28/20 0630 09/28/20 0700 09/28/20 0820  BP: 128/71 (!) 126/55 134/69 135/67  Pulse: 78 78 76 82  Resp: '15 13 17 20  '$ Temp:      TempSrc:      SpO2: 98% 98% 99% 97%  Weight:      Height:        Wt Readings from Last 3 Encounters:  09/27/20 62.1 kg  06/21/20 59.9 kg  04/26/20 58.1 kg     Intake/Output Summary (Last 24 hours) at 09/28/2020 1108 Last data filed at 09/28/2020 0834 Gross per 24 hour  Intake 200 ml  Output 740 ml  Net -540 ml     Physical Exam Gen Exam:Alert awake-not in any distress HEENT:atraumatic, normocephalic Chest: B/L clear to auscultation anteriorly CVS:S1S2 regular Abdomen:soft non tender, non distended Extremities:no edema Neurology: Non focal Skin: no rash   Data Review:    CBC Recent Labs  Lab 09/27/20 2324  WBC 7.0  HGB 11.5*  HCT 34.8*  PLT 269  MCV 90.4  MCH 29.9  MCHC 33.0  RDW 13.2  LYMPHSABS 1.8  MONOABS 0.6  EOSABS 0.1  BASOSABS 0.0    Chemistries  Recent Labs  Lab 09/27/20 2324  NA 139  K 3.4*  CL 108  CO2 25  GLUCOSE 121*  BUN 13  CREATININE 0.74  CALCIUM 8.4*  MG 2.0  AST 166*  ALT 91*  ALKPHOS 98  BILITOT 0.7    ------------------------------------------------------------------------------------------------------------------ No results for input(s): CHOL, HDL, LDLCALC, TRIG, CHOLHDL, LDLDIRECT in the last 72 hours.  No results found for: HGBA1C ------------------------------------------------------------------------------------------------------------------ No results for input(s): TSH, T4TOTAL, T3FREE, THYROIDAB in the last 72 hours.  Invalid input(s): FREET3 ------------------------------------------------------------------------------------------------------------------ Recent Labs    09/27/20 2344  FERRITIN 48    Coagulation profile Recent Labs  Lab 09/27/20 2324  INR 1.1    Recent Labs    09/27/20 2344  DDIMER <0.27    Cardiac Enzymes No results for input(s): CKMB, TROPONINI, MYOGLOBIN in the last 168 hours.  Invalid  input(s): CK ------------------------------------------------------------------------------------------------------------------    Component Value Date/Time   BNP 39.2 09/27/2020 2324    Micro Results Recent Results (from the past 240 hour(s))  Resp Panel by RT-PCR (Flu A&B, Covid) Nasopharyngeal Swab     Status: Abnormal   Collection Time: 09/27/20 11:27 PM   Specimen: Nasopharyngeal Swab; Nasopharyngeal(NP) swabs in vial transport medium  Result Value Ref Range Status   SARS Coronavirus 2 by RT PCR POSITIVE (A) NEGATIVE Final    Comment: RESULT CALLED TO, READ BACK BY AND VERIFIED WITH: TIA BULLOCK'@0130'$  09/28/20 RH (NOTE) SARS-CoV-2 target nucleic acids are DETECTED.  The SARS-CoV-2 RNA is generally detectable in upper respiratory specimens during the acute phase of infection. Positive results are indicative of the presence of the identified virus, but do not rule out bacterial infection or co-infection with other pathogens not detected by the test. Clinical correlation with patient history and other diagnostic information is necessary to  determine patient infection status. The expected result is Negative.  Fact Sheet for Patients: EntrepreneurPulse.com.au  Fact Sheet for Healthcare Providers: IncredibleEmployment.be  This test is not yet approved or cleared by the [place] FDA and  has been authorized for detection and/or diagnosis of SARS-CoV-2 by FDA under an Emergency Use Authorization (EUA).  This EUA will remain in effect (meaning this test can be used)  for the duration of  the COVID-19 declaration under Section 564(b)(1) of the Act, 21 U.S.C. section 360bbb-3(b)(1), unless the authorization is terminated or revoked sooner.     Influenza A by PCR NEGATIVE NEGATIVE Final   Influenza B by PCR NEGATIVE NEGATIVE Final    Comment: (NOTE) The Xpert Xpress SARS-CoV-2/FLU/RSV plus assay is intended as an aid in the diagnosis of influenza from Nasopharyngeal swab specimens and should not be used as a sole basis for treatment. Nasal washings and aspirates are unacceptable for Xpert Xpress SARS-CoV-2/FLU/RSV testing.  Fact Sheet for Patients: EntrepreneurPulse.com.au  Fact Sheet for Healthcare Providers: IncredibleEmployment.be  This test is not yet approved or cleared by the [place] FDA and has been authorized for detection and/or diagnosis of SARS-CoV-2 by FDA under an Emergency Use Authorization (EUA). This EUA will remain in effect (meaning this test can be used) for the duration of the COVID-19 declaration under Section 564(b)(1) of the Act, 21 U.S.C. section 360bbb-3(b)(1), unless the authorization is terminated or revoked.  Performed at [facility], [address] [place], [place] [address]   Culture, blood (Routine X 2) w Reflex to ID Panel     Status: None (Preliminary result)   Collection Time: 09/27/20 11:41 PM   Specimen: BLOOD LEFT HAND  Result Value Ref Range Status   Specimen Description BLOOD LEFT  HAND  Final   Special Requests   Final    BOTTLES DRAWN AEROBIC AND ANAEROBIC Blood Culture adequate volume   Culture   Final    NO GROWTH < 12 HOURS Performed at [facility], [address] [place],  [address]    Report Status PENDING  Incomplete  Culture, blood (Routine X 2) w Reflex to ID Panel     Status: None (Preliminary result)   Collection Time: 09/27/20 11:44 PM   Specimen: BLOOD  Result Value Ref Range Status   Specimen Description BLOOD RIGHT [facility]  Final   Special Requests   Final    BOTTLES DRAWN AEROBIC AND ANAEROBIC Blood Culture results may not be optimal due to an excessive volume of blood received in culture bottles   Culture  Final    NO GROWTH < 12 HOURS Performed at [facility], [address] [place], [place] [address]    Report Status PENDING  Incomplete    Radiology Reports CT HEAD WO CONTRAST (5MM)  Result Date: 09/28/2020 CLINICAL DATA:  Fall.  Mental status changes EXAM: CT HEAD WITHOUT CONTRAST TECHNIQUE: Contiguous axial images were obtained from the base of the skull through the vertex without intravenous contrast. COMPARISON:  02/22/2018 FINDINGS: Brain: No acute intracranial abnormality. Specifically, no hemorrhage, hydrocephalus, mass lesion, acute infarction, or significant intracranial injury. Vascular: No hyperdense vessel or unexpected calcification. Skull: No acute calvarial abnormality. Sinuses/Orbits: Air-fluid level in the left maxillary sinus. Other: None IMPRESSION: No acute intracranial abnormality. Air-fluid level in the left maxillary sinus, likely acute sinusitis. Electronically Signed   By: Rolm Baptise M.D.   On: 09/28/2020 00:12   CT Cervical Spine Wo Contrast  Result Date: 09/28/2020 CLINICAL DATA:  Fall.  Neck trauma (Age >= 65y) EXAM: CT CERVICAL SPINE WITHOUT CONTRAST TECHNIQUE: Multidetector CT imaging of the cervical spine was performed without intravenous contrast. Multiplanar CT image reconstructions  were also generated. COMPARISON:  None. FINDINGS: Alignment: Normal Skull base and vertebrae: No acute fracture. No primary bone lesion or focal pathologic process. Soft tissues and spinal canal: No prevertebral fluid or swelling. No visible canal hematoma. Disc levels:  Maintained Upper chest: Negative Other: None IMPRESSION: Normal study. Electronically Signed   By: Rolm Baptise M.D.   On: 09/28/2020 00:13   DG Chest Portable 1 View  Result Date: 09/27/2020 CLINICAL DATA:  Hypotension EXAM: PORTABLE CHEST 1 VIEW COMPARISON:  03/16/2016 FINDINGS: Heart and mediastinal contours are within normal limits. No focal opacities or effusions. No acute bony abnormality. IMPRESSION: No active disease. Electronically Signed   By: Rolm Baptise M.D.   On: 09/27/2020 23:43

## 2020-09-28 NOTE — ED Notes (Addendum)
Medications due at 1000 not verified at this time. Pharmacy messaged regarding medication verification.

## 2020-09-28 NOTE — ED Notes (Signed)
Pt on BSC. Peri care performed. [place] provided.

## 2020-09-28 NOTE — ED Notes (Signed)
Pt wet self on bed due to purewock suction not working. Helped pt to [facility] where pt voided again. Peri care performed, purewick replaced, bed and down and linens changed. Pt moved to different room with working suction. OT now at bedside. Pt moderately anxious. Pt declines lunch tray at this time. Report given to floor nurse for transfer to floor.

## 2020-09-28 NOTE — Progress Notes (Signed)
Patient transferred to unit via wheelchair, no acute distress noted.  Patient states she left her cane in ED.  ED called and made aware.  No acute distress noted.  Patient alert and oriented x4, no respiratory distress, or discomfort noted. Patient covid positive.  Airborne/contact precautions initiated, patient educated.  Will continue to monitor and report off to oncoming nurse.

## 2020-09-28 NOTE — ED Notes (Signed)
Lab in room collecting troponin and HIV screening labs at this time

## 2020-09-28 NOTE — ED Notes (Signed)
This RN bathed pt, did full linen change on bed, assisted pt to [facility], and changed pt's gown. New purewick set in place at this time, and pt given fresh blankets.

## 2020-09-28 NOTE — ED Notes (Signed)
During orthostatic VS pt did not report any signs of dizziness or SOB, pt reported they "felt perfectly fine". This RN did not pt became slightly tachypneac with RR of 24/min when they stood up, pt appeared to have slightly harder work of breathing while ambulating in room, breaths became more shallow and more frequent, but pt reported they did not feel SOB.

## 2020-09-28 NOTE — ED Notes (Signed)
Pt assisted to [facility]. Pt had 1 urine occurrence. Encouraged pt to void into purewick.

## 2020-09-28 NOTE — ED Notes (Signed)
Called 2C. RN assigned to room 228 is ready for pt. Will bring pt to floor.

## 2020-09-28 NOTE — Progress Notes (Signed)
Remdesivir - Pharmacy Brief Note   O:  CXR: "No active disease." SpO2: 93-100% on RA   A/P:  Remdesivir 200 mg IVPB once followed by 100 mg IVPB daily x 4 days.   Renda Rolls, PharmD, [facility] 09/28/2020 3:50 AM

## 2020-09-28 NOTE — H&P (Addendum)
[place]   PATIENT NAME: Sandra [name]    MR#:  [id number]  DATE OF BIRTH:  10/15/1955  DATE OF ADMISSION:  09/27/2020  PRIMARY CARE PHYSICIAN: Baxter Hire, MD   Patient is coming from: Home  REQUESTING/REFERRING PHYSICIAN: Ward, Cyril Mourning, DO  CHIEF COMPLAINT:   Chief Complaint  Patient presents with   Fall    HISTORY OF PRESENT ILLNESS:  Sandra [name] is a 65 y.o. Caucasian female with medical history significant for anxiety and depression, hypertension, dyslipidemia, peptic ulcer disease, GERD and hepatitis B, presented to the emergency room with acute onset of recurrent falls.  The patient admitted to recent sore throat on Wednesday as well as malaise from Tuesday.  She has been having dry cough since yesterday with no nausea or vomiting or diarrhea.  She denies any loss of taste or smell.  She stated that she had a sick exposure.  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  She had 2 vaccines for COVID-19 without boosters.  She admitted to recent dizziness and lightheadedness over the last few days with a recurrent falls.  No dysuria, oliguria or hematuria however she has been having urinary urgency.  No chest pain or palpitations or dyspnea.  No paresthesias or focal muscle weakness.  ED Course: Upon presenting to the emergency room blood pressure was 85/57 with a temperature of 99.2 otherwise normal vital signs.  Blood pressure is improved to 128/73 with hydration.  Labs revealed mild hypokalemia 3.4 and magnesium was 2 with otherwise unremarkable CMP.  BNP was 39.2 and high-sensitivity troponin I was 5.  Procalcitonin was was less than 0.1 and lactic acid 1.1.  CBC was unremarkable.  D-dimer came back less than 0.25 and fibrinogen 353.  COVID-19 PCR came back positive and UA was negative.  Urine drug screen came back positive for opiates and tricyclics.  Blood cultures and urine culture were drawn. EKG as reviewed by me : EKG showed normal sinus rhythm with a rate of  97 with low voltage QRS and poor R wave progression prolonged QT interval with QTC of 558 MS. Imaging: Portable chest ray showed no acute cardiopulmonary disease.  The patient was given 2 L bolus of IV lactated Ringer and was found by 100 mL/hour of IV normal saline.  She will be admitted to an observation medical monitored bed for further evaluation and management. PAST MEDICAL HISTORY:   Past Medical History:  Diagnosis Date   Anemia    Anxiety 08/01/2014   Arthritis, degenerative 07/30/2013   Clinical depression 06/30/2014   Depression    Elevated liver enzymes    GERD (gastroesophageal reflux disease)    H/O breast biopsy 01/05/2015   H/O: attempted suicide 2013   Hepatitis B    Hiatal hernia    History of hysterectomy 01/05/2015   History of peptic ulcer disease 01/05/2015   Hypercholesteremia    Hypertension    Legally blind    Panic attacks    PUD (peptic ulcer disease)    Rectocele 01/05/2015   Retinitis pigmentosa    S/P cholecystectomy 01/05/2015    PAST SURGICAL HISTORY:   Past Surgical History:  Procedure Laterality Date   ABDOMINAL HYSTERECTOMY     APPENDECTOMY     BREAST BIOPSY Bilateral 1977   neg   CHOLECYSTECTOMY     COLONOSCOPY     ESOPHAGOGASTRODUODENOSCOPY     ESOPHAGOGASTRODUODENOSCOPY (EGD) WITH PROPOFOL N/A 09/17/2017   Procedure: ESOPHAGOGASTRODUODENOSCOPY (EGD) WITH PROPOFOL;  Surgeon: [place],  Benay Pike, MD;  Location: [facility];  Service: Gastroenterology;  Laterality: N/A;   HEMORRHOID SURGERY N/A 03/07/2017   Procedure: HEMORRHOIDECTOMY;  Surgeon: Leonie Green, MD;  Location: [facility] ORS;  Service: General;  Laterality: N/A;   [place] N/A 01/24/2017   Procedure: SPHINCTEROTOMY;  Surgeon: Leonie Green, MD;  Location: [facility] ORS;  Service: General;  Laterality: N/A;    SOCIAL HISTORY:   Social History   Tobacco Use   Smoking status: Former    Types: Cigarettes     Quit date: 08/30/1973    Years since quitting: 47.1   Smokeless tobacco: Never  Substance Use Topics   Alcohol use: No    Alcohol/week: 0.0 standard drinks    FAMILY HISTORY:   Family History  Problem Relation Age of Onset   Hypertension Mother    Diabetes Mother    Dementia Mother    Cancer Father    Prostate cancer Father    Breast cancer Sister     DRUG ALLERGIES:   Allergies  Allergen Reactions   Trazodone Hives and Itching   Acetaminophen-Codeine Other (See Comments)    Abdominal and body cramping Abdominal and body cramping    Codeine Other (See Comments)    Stomach pain Stomach pain Stomach pain Stomach pain   Oxycontin [Oxycodone Hcl] Nausea And Vomiting   Trazodone And Nefazodone Hives and Itching   Vicodin [Hydrocodone-Acetaminophen] Itching   Zolpidem Other (See Comments)    Sleep walking  Ineffective, did not help pt sleep  Sleep walking  Ineffective, did not help pt sleep     Hydrocodone-Acetaminophen Itching   Oxycodone-Acetaminophen Nausea Only    REVIEW OF SYSTEMS:   ROS As per history of present illness. All pertinent systems were reviewed above. Constitutional, HEENT, cardiovascular, respiratory, GI, GU, musculoskeletal, neuro, psychiatric, endocrine, integumentary and hematologic systems were reviewed and are otherwise negative/unremarkable except for positive findings mentioned above in the HPI.   MEDICATIONS AT HOME:   Prior to Admission medications   Medication Sig Start Date End Date Taking? Authorizing Provider  acetaminophen (TYLENOL) 325 MG tablet Take 650 mg by mouth every 6 (six) hours as needed.    [provider]  albuterol (VENTOLIN HFA) 108 (90 Base) MCG/ACT inhaler Inhale 2 puffs into the lungs every 6 (six) hours as needed for wheezing or shortness of breath.    [provider]  amitriptyline (ELAVIL) 25 MG tablet Take 25 mg by mouth at bedtime. 05/21/19   [provider]  amLODipine (NORVASC) 5  MG tablet Take 5 mg by mouth daily. 05/21/19   [provider]  amlodipine-benazepril (LOTREL) 2.5-10 MG capsule Take 1 capsule by mouth every morning.    [provider]  aspirin 81 MG tablet Take 81 mg by mouth daily.    [provider]  cyclobenzaprine (FLEXERIL) 10 MG tablet Take 1 tablet (10 mg total) by mouth 3 (three) times daily as needed for muscle spasms. 09/21/20 12/20/20  Gillis Santa, MD  esomeprazole (NEXIUM) 40 MG capsule Take by mouth. 04/22/19 06/21/20  [provider]  famotidine (PEPCID) 20 MG tablet Take 40 mg by mouth at bedtime. 12/16/18   [provider]  famotidine (PEPCID) 40 MG tablet Take 40 mg by mouth at bedtime. Patient not taking: Reported on 06/21/2020 11/01/19   [provider]  fluticasone (FLONASE) 50 MCG/ACT nasal spray Place 2 sprays into both  nostrils daily.    [provider]  gabapentin (NEURONTIN) 400 MG capsule Take 1 capsule (400 mg total) by mouth 2 (two) times daily. 400 mg tid 09/21/20   Gillis Santa, MD  hydrochlorothiazide (HYDRODIURIL) 12.5 MG tablet TAKE ONE TABLET BY MOUTH ONCE DAILY AS NEEDED FOR SWELLING OF FEET 06/08/18   [provider]  HYDROcodone-acetaminophen (NORCO/VICODIN) 5-325 MG tablet Take 1 tablet by mouth every 6 (six) hours as needed for severe pain. Must last 30 days. 09/22/20 10/22/20  Gillis Santa, MD  HYDROcodone-acetaminophen (NORCO/VICODIN) 5-325 MG tablet Take 1 tablet by mouth every 6 (six) hours as needed for severe pain. Must last 30 days. 10/22/20 11/21/20  Gillis Santa, MD  HYDROcodone-acetaminophen (NORCO/VICODIN) 5-325 MG tablet Take 1 tablet by mouth every 6 (six) hours as needed for severe pain. Must last 30 days. 11/21/20 12/21/20  Gillis Santa, MD  hydrOXYzine (ATARAX/VISTARIL) 25 MG tablet Take 25 mg by mouth 3 (three) times daily as needed. 12/13/19   [provider]  levocetirizine (XYZAL) 5 MG tablet Take 5 mg by mouth every evening. 11/18/18    [provider]  meclizine (ANTIVERT) 25 MG tablet Take 1 tablet (25 mg total) by mouth 3 (three) times daily as needed for dizziness. Patient not taking: Reported on 09/28/2020 10/01/18   Cuthriell, Charline Bills, PA-C  Melatonin 10 MG TABS Take 2 tablets by mouth as needed (TAKES WITH LUNESTA IF THE LUNESTA DOES NOT HELP).     [provider]  montelukast (SINGULAIR) 10 MG tablet Take 10 mg by mouth at bedtime. 07/17/18   [provider]  naproxen (NAPROSYN) 500 MG tablet Take 500 mg by mouth 2 (two) times daily. 08/05/19   [provider]  ondansetron (ZOFRAN ODT) 4 MG disintegrating tablet Take 1 tablet (4 mg total) by mouth every 8 (eight) hours as needed for nausea or vomiting. Patient not taking: Reported on 09/28/2020 03/16/16   Hagler, Jami L, PA-C  promethazine (PHENERGAN) 25 MG tablet Take by mouth. 05/31/19   [provider]  ROBITUSSIN COUGH+CHEST CONG DM 10-200 MG CAPS Take 15 mLs by mouth daily as needed. 08/05/19   [provider]  simvastatin (ZOCOR) 20 MG tablet Take 20 mg by mouth at bedtime.  02/01/15   [provider]  sucralfate (CARAFATE) 1 g tablet Take 1 g by mouth 2 (two) times daily with a meal. 08/17/19 08/16/20  [provider]  temazepam (RESTORIL) 15 MG capsule Take 15 mg by mouth at bedtime. 11/10/18   [provider]  traMADol (ULTRAM-ER) 100 MG 24 hr tablet Take 1 tablet (100 mg total) by mouth daily. 10/05/20 01/03/21  Gillis Santa, MD  vitamin B-12 (CYANOCOBALAMIN) 500 MCG tablet Take 500 mcg by mouth daily.    [provider]      VITAL SIGNS:  Blood pressure (!) 126/55, pulse 78, temperature (S) 98.9 F (37.2 C), temperature source (S) Rectal, resp. rate 13, height '5\' 3"'$  (1.6 m), weight 62.1 kg, SpO2 98 %.  PHYSICAL EXAMINATION:  Physical Exam  GENERAL:  65 y.o.-year-old Caucasian female patient lying in the bed with minimal respiratory distress with irritant dry cough.   EYES:  Pupils equal, round, reactive to light and accommodation. No scleral icterus. Extraocular muscles intact.  HEENT: Head atraumatic, normocephalic. Oropharynx and nasopharynx clear.  NECK:  Supple, no jugular venous distention. No thyroid enlargement, no tenderness.  LUNGS: Normal breath sounds bilaterally, no wheezing, rales,rhonchi or crepitation. No use of accessory muscles of respiration.  CARDIOVASCULAR: Regular rate and rhythm, S1, S2 normal. No murmurs, rubs, or gallops.  ABDOMEN: Soft, nondistended, nontender. Bowel sounds present. No organomegaly or mass.  EXTREMITIES: No pedal edema, cyanosis, or clubbing.  NEUROLOGIC: Cranial nerves II through XII are intact. Muscle strength 5/5 in all extremities. Sensation intact. Gait not checked.  PSYCHIATRIC: The patient is alert and oriented x 3.  Normal affect and good eye contact. SKIN: No obvious rash, lesion, or ulcer.   LABORATORY PANEL:   CBC Recent Labs  Lab 09/27/20 2324  WBC 7.0  HGB 11.5*  HCT 34.8*  PLT 269   ------------------------------------------------------------------------------------------------------------------  Chemistries  Recent Labs  Lab 09/27/20 2324  NA 139  K 3.4*  CL 108  CO2 25  GLUCOSE 121*  BUN 13  CREATININE 0.74  CALCIUM 8.4*  MG 2.0  AST 166*  ALT 91*  ALKPHOS 98  BILITOT 0.7   ------------------------------------------------------------------------------------------------------------------  Cardiac Enzymes No results for input(s): TROPONINI in the last 168 hours. ------------------------------------------------------------------------------------------------------------------  RADIOLOGY:  CT HEAD WO CONTRAST (5MM)  Result Date: 09/28/2020 CLINICAL DATA:  Fall.  Mental status changes EXAM: CT HEAD WITHOUT CONTRAST TECHNIQUE: Contiguous axial images were obtained from the base of the skull through the vertex without intravenous contrast. COMPARISON:  02/22/2018 FINDINGS: Brain: No  acute intracranial abnormality. Specifically, no hemorrhage, hydrocephalus, mass lesion, acute infarction, or significant intracranial injury. Vascular: No hyperdense vessel or unexpected calcification. Skull: No acute calvarial abnormality. Sinuses/Orbits: Air-fluid level in the left maxillary sinus. Other: None IMPRESSION: No acute intracranial abnormality. Air-fluid level in the left maxillary sinus, likely acute sinusitis. Electronically Signed   By: Rolm Baptise M.D.   On: 09/28/2020 00:12   CT Cervical Spine Wo Contrast  Result Date: 09/28/2020 CLINICAL DATA:  Fall.  Neck trauma (Age >= 65y) EXAM: CT CERVICAL SPINE WITHOUT CONTRAST TECHNIQUE: Multidetector CT imaging of the cervical spine was performed without intravenous contrast. Multiplanar CT image reconstructions were also generated. COMPARISON:  None. FINDINGS: Alignment: Normal Skull base and vertebrae: No acute fracture. No primary bone lesion or focal pathologic process. Soft tissues and spinal canal: No prevertebral fluid or swelling. No visible canal hematoma. Disc levels:  Maintained Upper chest: Negative Other: None IMPRESSION: Normal study. Electronically Signed   By: Rolm Baptise M.D.   On: 09/28/2020 00:13   DG Chest Portable 1 View  Result Date: 09/27/2020 CLINICAL DATA:  Hypotension EXAM: PORTABLE CHEST 1 VIEW COMPARISON:  03/16/2016 FINDINGS: Heart and mediastinal contours are within normal limits. No focal opacities or effusions. No acute bony abnormality. IMPRESSION: No active disease. Electronically Signed   By: Rolm Baptise M.D.   On: 09/27/2020 23:43      IMPRESSION AND PLAN:  Active Problems:   Hypotension  1.  Hypotension likely with orthostatic hypotension possibly contributing to her recurrent falls.  This could be related to associated volume depletion and anorexia with COVID-19.  The patient has mild hypokalemia. - The patient will be admitted to an observation medically monitored bed. - We will continue  hydration with IV normal saline. - We will follow her orthostatics. - Potassium replacement.  2.  COVID-19 infection. - She will be placed on IV remdesivir. - Vitamin D3, vitamin C, zinc sulfate and aspirin will be provided. - We will follow inflammatory markers.  3.  Essential hypertension. - We will continue Lotrel with holding parameters. - We will hold off amitriptyline.  4.  GERD. - We will continue PPI therapy and H2 blocker therapy.  DVT prophylaxis: Lovenox.  Code Status: full code.  Family Communication:  The plan of care was discussed in details with the patient (and family). I answered all questions. The patient agreed to proceed with the above mentioned plan. Further management will depend upon hospital course. Disposition Plan: Back to previous home environment Consults called: none.  All the records are reviewed and case discussed with ED provider.  Status is: Observation  The patient remains OBS appropriate and will d/c before 2 midnights.  Dispo: The patient is from: Home              Anticipated d/c is to: Home              Patient currently is not medically stable to d/c.   Difficult to place patient No      TOTAL TIME TAKING CARE OF THIS PATIENT: 55 minutes.    Christel Mormon M.D on 09/28/2020 at 6:57 AM  [facility]   From 7 PM-7 AM, contact night-coverage [email or web address]  CC: Primary care physician; Baxter Hire, MD

## 2020-09-29 LAB — CBC WITH DIFFERENTIAL/PLATELET
Abs Immature Granulocytes: 0.01 10*3/uL (ref 0.00–0.07)
Basophils Absolute: 0 10*3/uL (ref 0.0–0.1)
Basophils Relative: 0 %
Eosinophils Absolute: 0 10*3/uL (ref 0.0–0.5)
Eosinophils Relative: 1 %
HCT: 33.6 % — ABNORMAL LOW (ref 36.0–46.0)
Hemoglobin: 11.2 g/dL — ABNORMAL LOW (ref 12.0–15.0)
Immature Granulocytes: 0 %
Lymphocytes Relative: 35 %
Lymphs Abs: 2.1 10*3/uL (ref 0.7–4.0)
MCH: 29.7 pg (ref 26.0–34.0)
MCHC: 33.3 g/dL (ref 30.0–36.0)
MCV: 89.1 fL (ref 80.0–100.0)
Monocytes Absolute: 0.8 10*3/uL (ref 0.1–1.0)
Monocytes Relative: 14 %
Neutro Abs: 3 10*3/uL (ref 1.7–7.7)
Neutrophils Relative %: 50 %
Platelets: 265 10*3/uL (ref 150–400)
RBC: 3.77 MIL/uL — ABNORMAL LOW (ref 3.87–5.11)
RDW: 13.3 % (ref 11.5–15.5)
WBC: 5.9 10*3/uL (ref 4.0–10.5)
nRBC: 0 % (ref 0.0–0.2)

## 2020-09-29 LAB — COMPREHENSIVE METABOLIC PANEL
ALT: 79 U/L — ABNORMAL HIGH (ref 0–44)
AST: 76 U/L — ABNORMAL HIGH (ref 15–41)
Albumin: 3.7 g/dL (ref 3.5–5.0)
Alkaline Phosphatase: 94 U/L (ref 38–126)
Anion gap: 4 — ABNORMAL LOW (ref 5–15)
BUN: 10 mg/dL (ref 8–23)
CO2: 27 mmol/L (ref 22–32)
Calcium: 8.2 mg/dL — ABNORMAL LOW (ref 8.9–10.3)
Chloride: 109 mmol/L (ref 98–111)
Creatinine, Ser: 0.62 mg/dL (ref 0.44–1.00)
GFR, Estimated: >60 mL/min (ref >60)
Glucose, Bld: 92 mg/dL (ref 70–99)
Potassium: 3.7 mmol/L (ref 3.5–5.1)
Sodium: 140 mmol/L (ref 135–145)
Total Bilirubin: 0.5 mg/dL (ref 0.3–1.2)
Total Protein: 6.2 g/dL — ABNORMAL LOW (ref 6.5–8.1)

## 2020-09-29 MED ORDER — SODIUM CHLORIDE 0.9% FLUSH
3.0000 mL | Freq: Two times a day (BID) | INTRAVENOUS | Status: DC
  Administered 2020-09-29 – 2020-09-30 (×3): 3 mL via INTRAVENOUS

## 2020-09-29 MED ORDER — HYDROCODONE-ACETAMINOPHEN 5-325 MG PO TABS
1.0000 | ORAL_TABLET | Freq: Four times a day (QID) | ORAL | Status: DC | PRN
  Administered 2020-09-29 – 2020-09-30 (×4): 1 via ORAL
  Filled 2020-09-29 (×4): qty 1

## 2020-09-29 MED ORDER — SODIUM CHLORIDE 0.9% FLUSH: 3.0000 mL | INTRAVENOUS | Status: DC | PRN

## 2020-09-29 MED ORDER — SODIUM CHLORIDE 0.9 % IV SOLN: 250.0000 mL | INTRAVENOUS | Status: DC | PRN

## 2020-09-29 NOTE — Progress Notes (Signed)
Mobility Specialist - Progress Note   09/29/20 1500  Mobility  Activity Ambulated to bathroom  Level of Assistance Standby assist, set-up cues, supervision of patient - no hands on  Assistive Device None  Distance Ambulated (ft) 30 ft  Mobility Out of bed for toileting  Mobility Response Tolerated well  Mobility performed by Mobility specialist  $Mobility charge 1 Mobility    Pt assisted to bathroom with supervision. No LOB, VC for direction. Independent in seated peri-care. Pt c/o pain in buttocks and itchiness at IV site. RN notified. Pt returned to bed with alarm set, needs in reach.    Kathee Delton Mobility Specialist 09/29/20, 3:53 PM

## 2020-09-29 NOTE — Evaluation (Signed)
Physical Therapy Evaluation Patient Details Name: Sandra [name] MRN: [id number] DOB: 1955/09/17 Today's Date: 09/29/2020   History of Present Illness  65 y.o. female with PMHx of chronic gait disorder with multiple frequent falls, anxiety/depression, HTN, HLD who presented with worsening weakness, cough, nasal congestion and frequent falls.  She recently had exposure to COVID-19.  Upon further evaluation-she was found to have COVID-19 infection-and hypotension due to volume depletion.  Clinical Impression  Pt is a pleasant 65 year old female who was admitted for hypotension related to covid. Pt demonstrates all bed mobility/transfers/ambulation at baseline level. Typically uses sight cane at baseline, however not available in room. No dizziness with position change. O2 on RA WNL. Pt does not require any further PT needs at this time. Pt will be dc in house and does not require follow up. RN aware. Will dc current orders. Pt hoping for home discharge next date, however concerned about transportation.  Orthostatics performed: Supine: 151/75; HR 80 Seated: 148/77; HR 80 Standing: 151/85; HR 88     Follow Up Recommendations No PT follow up    Equipment Recommendations  None recommended by PT    Recommendations for Other Services       Precautions / Restrictions Precautions Precautions: Fall Restrictions Weight Bearing Restrictions: No      Mobility  Bed Mobility Overal bed mobility: Modified Independent             General bed mobility comments: safe technique with upright posture once seated    Transfers Overall transfer level: Modified independent Equipment used: None             General transfer comment: safe technique with upright posture. Once standing, able to use IV pole for assist  Ambulation/Gait Ambulation/Gait assistance: Min guard Gait Distance (Feet): 40 Feet Assistive device: IV Pole Gait Pattern/deviations: WFL(Within Functional Limits)      General Gait Details: ambulated in room (limited due to isolation) using IV pole for assistance due to vision deficits. Safe technique with guidance for obstacles.  Stairs            Wheelchair Mobility    Modified Rankin (Stroke Patients Only)       Balance Overall balance assessment: Needs assistance Sitting-balance support: Feet supported;Bilateral upper extremity supported Sitting balance-Leahy Scale: Good     Standing balance support: During functional activity;Single extremity supported Standing balance-Leahy Scale: Fair                               Pertinent Vitals/Pain Pain Assessment: Faces Faces Pain Scale: Hurts a little bit Pain Location: vague chronic pain Pain Descriptors / Indicators: Discomfort Pain Intervention(s): Limited activity within patient's tolerance    Home Living Family/patient expects to be discharged to:: Private residence Living Arrangements: Spouse/significant other Available Help at Discharge: Family;Available 24 hours/day Type of Home: House Home Access: Stairs to enter Entrance Stairs-Rails: Psychiatric nurse of Steps: 2 Home Layout: One level Home Equipment: Shower seat;Other (comment) Additional Comments: pt ambulates with white sight cane    Prior Function Level of Independence: Independent with assistive device(s)         Comments: Pt ambulates with white sight cane in community and reports assist from boyfriend to get onto shower seat for bathing. She washes and dresses herself. He goes to grocery store and assists with IADL tasks.     Hand Dominance   Dominant Hand: Right    Extremity/Trunk Assessment  Upper Extremity Assessment Upper Extremity Assessment: Overall WFL for tasks assessed    Lower Extremity Assessment Lower Extremity Assessment: Overall WFL for tasks assessed       Communication   Communication: No difficulties  Cognition Arousal/Alertness:  Awake/alert Behavior During Therapy: WFL for tasks assessed/performed Overall Cognitive Status: Within Functional Limits for tasks assessed                                 General Comments: Pt is pleasant and cooperative. A & O x4.      General Comments      Exercises Other Exercises Other Exercises: ambulated to bathroom with cga. Safe technique and able to perform hygiene with supervision   Assessment/Plan    PT Assessment Patent does not need any further PT services  PT Problem List         PT Treatment Interventions      PT Goals (Current goals can be found in the Care Plan section)  Acute Rehab PT Goals Patient Stated Goal: to feel better and go home PT Goal Formulation: All assessment and education complete, DC therapy Time For Goal Achievement: 09/29/20 Potential to Achieve Goals: Good    Frequency     Barriers to discharge        Co-evaluation               AM-PAC PT "6 Clicks" Mobility  Outcome Measure Help needed turning from your back to your side while in a flat bed without using bedrails?: None Help needed moving from lying on your back to sitting on the side of a flat bed without using bedrails?: None Help needed moving to and from a bed to a chair (including a wheelchair)?: A Little Help needed standing up from a chair using your arms (e.g., wheelchair or bedside chair)?: A Little Help needed to walk in hospital room?: A Little Help needed climbing 3-5 steps with a railing? : A Little 6 Click Score: 20    End of Session   Activity Tolerance: Patient tolerated treatment well Patient left: in bed;with bed alarm set Nurse Communication: Mobility status PT Visit Diagnosis: Difficulty in walking, not elsewhere classified (R26.2)    Time: [id number] PT Time Calculation (min) (ACUTE ONLY): 25 min   Charges:   PT Evaluation $PT Eval Low Complexity: 1 Low PT Treatments $Therapeutic Activity: 8-22 mins        Greggory Stallion,  PT, DPT [phone]   Dorette Hartel 09/29/2020, 1:26 PM

## 2020-09-29 NOTE — Progress Notes (Signed)
PROGRESS NOTE                                                                                                                                                                                                             Patient Demographics:    Sandra [name], is a 65 y.o. female, DOB - March 30, 1955, [id number]  Outpatient Primary MD for the patient is Baxter Hire, MD   Admit date - 09/27/2020   LOS - 1  Chief Complaint  Patient presents with   Fall       Brief Narrative: Patient is a 65 y.o. female with PMHx of chronic gait disorder with multiple frequent falls, anxiety/depression, HTN, HLD who presented with worsening weakness, cough, nasal congestion and frequent falls.  She recently had exposure to COVID-19.  Upon further evaluation-she was found to have COVID-19 infection-and hypotension due to volume depletion.  She was subsequently admitted to the hospitalist service.  See below for further details.   COVID-19 vaccinated status: Vaccinated but not boosted.   Significant Events: 8/10>> admit to [facility] for COVID-19 infection-hypotension-severe generalized weakness.  Significant studies: 8/10>> CXR: No pneumonia 8/11>> CT head: No acute abnormalities 8/11>> CT C-spine: No fractures  COVID-19 medications: Remdesivir: 8/10>>  Antibiotics: None  Microbiology data: 8/10 >>blood culture: No growth 8/10>> urine culture: Pending  Procedures: None  Consults: None  DVT prophylaxis: enoxaparin (LOVENOX) injection 40 mg Start: 09/28/20 0800    Subjective:   Feels somewhat better than yesterday-nasal congestion has significantly improved.  Continues to complain of myalgias and chronic pain.   Assessment  & Plan :   COVID-19 infection: Overall improved-complains of some myalgias-no pneumonia on CXR-no hypoxia-due to risk for severe disease-plan on Remdesivir x3 days.   Hypotension: Due to volume  depletion-resolved.  HTN: Stable with amlodipine-follow and adjust.      HLD: Continue statin  Peripheral neuropathy/chronic pain syndrome/chronic gait disorder/frequent falls: Continue Neurontin-narcotics-appreciate PT/OT evaluation.   GERD: Continue PPI   COVID-19 Labs: Recent Labs    09/27/20 2313 09/27/20 2344 09/28/20 0703  DDIMER  --  <0.27  --   FERRITIN  --  48  --   LDH  --   --  178  CRP 1.3*  --   --         Component Value Date/Time   BNP 39.2 09/27/2020  [place]  Lab 09/27/20 2324 09/27/20 2344  PROCALCITON <0.10 <0.10     Lab Results  Component Value Date   SARSCOV2NAA POSITIVE (A) 09/27/2020      ABG: No results found for: PHART, PCO2ART, PO2ART, HCO3, TCO2, ACIDBASEDEF, O2SAT  Vent Settings: N/A   Condition - Stable  Family Communication  :  Daughter-[phone] updated over the phone on 8/11  Code Status :  Full Code  Diet :  Diet Order             Diet Heart Room service appropriate? Yes; Fluid consistency: Thin  Diet effective now                    Disposition Plan  :   Status is: Observation  The patient will require care spanning > 2 midnights and should be moved to inpatient because: Inpatient level of care appropriate due to severity of illness  Dispo: The patient is from: Home              Anticipated d/c is to: Home              Patient currently is not medically stable to d/c.   Difficult to place patient No    Barriers to discharge: At risk for severe disease with COVID-19-needs 3 days of IV Remdesivir-very debilitated-needs PT/OT eval for safe disposition as well.  Antimicorbials  :    Anti-infectives (From admission, onward)    Start     Dose/Rate Route Frequency Ordered Stop   09/29/20 1000  remdesivir 100 mg in sodium chloride 0.9 % 100 mL IVPB  Status:  Discontinued       See Hyperspace for full Linked Orders Report.   100 mg 200 mL/hr over 30 Minutes Intravenous Daily 09/28/20  0343 09/28/20 0346   09/29/20 1000  remdesivir 100 mg in sodium chloride 0.9 % 100 mL IVPB       See Hyperspace for full Linked Orders Report.   100 mg 200 mL/hr over 30 Minutes Intravenous Daily 09/28/20 0349 10/03/20 0959   09/29/20 1000  remdesivir 100 mg in sodium chloride 0.9 % 100 mL IVPB  Status:  Discontinued       See Hyperspace for full Linked Orders Report.   100 mg 200 mL/hr over 30 Minutes Intravenous Daily 09/28/20 0406 09/28/20 0414   09/28/20 0530  remdesivir 200 mg in sodium chloride 0.9% 250 mL IVPB  Status:  Discontinued       See Hyperspace for full Linked Orders Report.   200 mg 580 mL/hr over 30 Minutes Intravenous Once 09/28/20 0343 09/28/20 0346   09/28/20 0530  remdesivir 100 mg in sodium chloride 0.9 % 100 mL IVPB       See Hyperspace for full Linked Orders Report.   100 mg 200 mL/hr over 30 Minutes Intravenous  Once 09/28/20 0349 09/28/20 0530   09/28/20 0500  remdesivir 100 mg in sodium chloride 0.9 % 100 mL IVPB       See Hyperspace for full Linked Orders Report.   100 mg 200 mL/hr over 30 Minutes Intravenous  Once 09/28/20 0349 09/28/20 0502   09/28/20 0415  remdesivir 200 mg in sodium chloride 0.9% 250 mL IVPB  Status:  Discontinued       See Hyperspace for full Linked Orders Report.   200 mg 580 mL/hr over 30 Minutes Intravenous Once 09/28/20 0406 09/28/20 0414       Inpatient Medications  Scheduled Meds:  amLODipine  5 mg Oral Daily   vitamin C  1,000 mg Oral Daily   aspirin  81 mg Oral Daily   cholecalciferol  1,000 Units Oral Daily   enoxaparin (LOVENOX) injection  40 mg Subcutaneous Q24H   famotidine  40 mg Oral QHS   fluticasone  2 spray Each Nare Daily   gabapentin  400 mg Oral TID   guaiFENesin  600 mg Oral BID   loratadine  10 mg Oral QPM   montelukast  10 mg Oral QHS   oxymetazoline  1 spray Each Nare BID   pantoprazole  40 mg Oral Daily   simvastatin  20 mg Oral QHS   sucralfate  1 g Oral BID WC   temazepam  15 mg Oral QHS    [START ON 10/05/2020] traMADol  50 mg Oral BID   vitamin B-12  500 mcg Oral Daily   zinc sulfate  220 mg Oral Daily   Continuous Infusions:  0.9 % NaCl with KCl 20 mEq / L 100 mL/hr at 09/29/20 [id number]   remdesivir 100 mg in NS 100 mL 100 mg (09/29/20 1057)   PRN Meds:.acetaminophen, albuterol, cyclobenzaprine, guaiFENesin-dextromethorphan, HYDROcodone-acetaminophen, hydrOXYzine, magnesium hydroxide, meclizine, melatonin, menthol-cetylpyridinium, ondansetron **OR** ondansetron (ZOFRAN) IV, ondansetron, promethazine   Time Spent in minutes  25   See all Orders from today for further details   Oren Binet M.D on 09/29/2020 at 2:12 PM  To page go to [email or web address] - use universal password  [facility] -  Office  [phone]    Objective:   Vitals:   09/28/20 1821 09/28/20 1939 09/29/20 0407 09/29/20 0845  BP: 134/70 133/63 (!) 145/70 (!) 144/82  Pulse: (!) 102 (!) 102 81 91  Resp: '18 20 20 18  '$ Temp: 99.4 F (37.4 C) 98.1 F (36.7 C) 98.4 F (36.9 C) 98.2 F (36.8 C)  TempSrc: Oral Oral Oral Oral  SpO2: 100% 97% 98% 99%  Weight:      Height:        Wt Readings from Last 3 Encounters:  09/27/20 62.1 kg  06/21/20 59.9 kg  04/26/20 58.1 kg     Intake/Output Summary (Last 24 hours) at 09/29/2020 1412 Last data filed at 09/29/2020 [id number] Gross per 24 hour  Intake 908.38 ml  Output --  Net 908.38 ml      Physical Exam Gen Exam:Alert awake-not in any distress HEENT:atraumatic, normocephalic Chest: B/L clear to auscultation anteriorly CVS:S1S2 regular Abdomen:soft non tender, non distended Extremities:no edema Neurology: Non focal Skin: no rash    Data Review:    CBC Recent Labs  Lab 09/27/20 2324 09/29/20 0624  WBC 7.0 5.9  HGB 11.5* 11.2*  HCT 34.8* 33.6*  PLT 269 265  MCV 90.4 89.1  MCH 29.9 29.7  MCHC 33.0 33.3  RDW 13.2 13.3  LYMPHSABS 1.8 2.1  MONOABS 0.6 0.8  EOSABS 0.1 0.0  BASOSABS 0.0 0.0     Chemistries  Recent Labs  Lab  09/27/20 2324 09/29/20 0624  NA 139 140  K 3.4* 3.7  CL 108 109  CO2 25 27  GLUCOSE 121* 92  BUN 13 10  CREATININE 0.74 0.62  CALCIUM 8.4* 8.2*  MG 2.0  --   AST 166* 76*  ALT 91* 79*  ALKPHOS 98 94  BILITOT 0.7 0.5    ------------------------------------------------------------------------------------------------------------------ No results for input(s): CHOL, HDL, LDLCALC, TRIG, CHOLHDL, LDLDIRECT in the last 72 hours.  No results found for: HGBA1C ------------------------------------------------------------------------------------------------------------------ No results for input(s): TSH,  T4TOTAL, T3FREE, THYROIDAB in the last 72 hours.  Invalid input(s): FREET3 ------------------------------------------------------------------------------------------------------------------ Recent Labs    09/27/20 2344  FERRITIN 48     Coagulation profile Recent Labs  Lab 09/27/20 2324  INR 1.1     Recent Labs    09/27/20 2344  DDIMER <0.27     Cardiac Enzymes No results for input(s): CKMB, TROPONINI, MYOGLOBIN in the last 168 hours.  Invalid input(s): CK ------------------------------------------------------------------------------------------------------------------    Component Value Date/Time   BNP 39.2 09/27/2020 2324    Micro Results Recent Results (from the past 240 hour(s))  Resp Panel by RT-PCR (Flu A&B, Covid) Nasopharyngeal Swab     Status: Abnormal   Collection Time: 09/27/20 11:27 PM   Specimen: Nasopharyngeal Swab; Nasopharyngeal(NP) swabs in vial transport medium  Result Value Ref Range Status   SARS Coronavirus 2 by RT PCR POSITIVE (A) NEGATIVE Final    Comment: RESULT CALLED TO, READ BACK BY AND VERIFIED WITH: TIA BULLOCK'@0130'$  09/28/20 RH (NOTE) SARS-CoV-2 target nucleic acids are DETECTED.  The SARS-CoV-2 RNA is generally detectable in upper respiratory specimens during the acute phase of infection. Positive results are indicative of the  presence of the identified virus, but do not rule out bacterial infection or co-infection with other pathogens not detected by the test. Clinical correlation with patient history and other diagnostic information is necessary to determine patient infection status. The expected result is Negative.  Fact Sheet for Patients: EntrepreneurPulse.com.au  Fact Sheet for Healthcare Providers: IncredibleEmployment.be  This test is not yet approved or cleared by the [place] FDA and  has been authorized for detection and/or diagnosis of SARS-CoV-2 by FDA under an Emergency Use Authorization (EUA).  This EUA will remain in effect (meaning this test can be used)  for the duration of  the COVID-19 declaration under Section 564(b)(1) of the Act, 21 U.S.C. section 360bbb-3(b)(1), unless the authorization is terminated or revoked sooner.     Influenza A by PCR NEGATIVE NEGATIVE Final   Influenza B by PCR NEGATIVE NEGATIVE Final    Comment: (NOTE) The Xpert Xpress SARS-CoV-2/FLU/RSV plus assay is intended as an aid in the diagnosis of influenza from Nasopharyngeal swab specimens and should not be used as a sole basis for treatment. Nasal washings and aspirates are unacceptable for Xpert Xpress SARS-CoV-2/FLU/RSV testing.  Fact Sheet for Patients: EntrepreneurPulse.com.au  Fact Sheet for Healthcare Providers: IncredibleEmployment.be  This test is not yet approved or cleared by the [place] FDA and has been authorized for detection and/or diagnosis of SARS-CoV-2 by FDA under an Emergency Use Authorization (EUA). This EUA will remain in effect (meaning this test can be used) for the duration of the COVID-19 declaration under Section 564(b)(1) of the Act, 21 U.S.C. section 360bbb-3(b)(1), unless the authorization is terminated or revoked.  Performed at [facility], [address] [place], [place]  [address]   Culture, blood (Routine X 2) w Reflex to ID Panel     Status: None (Preliminary result)   Collection Time: 09/27/20 11:41 PM   Specimen: BLOOD LEFT HAND  Result Value Ref Range Status   Specimen Description BLOOD LEFT HAND  Final   Special Requests   Final    BOTTLES DRAWN AEROBIC AND ANAEROBIC Blood Culture adequate volume   Culture   Final    NO GROWTH 1 DAY Performed at [facility], [address] [place], [place] [address]    Report Status PENDING  Incomplete  Urine Culture     Status: Abnormal (Preliminary  result)   Collection Time: 09/27/20 11:44 PM   Specimen: Urine, Clean Catch  Result Value Ref Range Status   Specimen Description   Final    URINE, CLEAN CATCH Performed at [facility], [address] [place], [place] [address]    Special Requests   Final    NONE Performed at [facility], [place], [place] [address]    Culture (A)  Final    40,000 COLONIES/mL ESCHERICHIA COLI SUSCEPTIBILITIES TO FOLLOW Performed at [facility], [place] [address] [place], [place] [address]    Report Status PENDING  Incomplete  Culture, blood (Routine X 2) w Reflex to ID Panel     Status: None (Preliminary result)   Collection Time: 09/27/20 11:44 PM   Specimen: BLOOD  Result Value Ref Range Status   Specimen Description BLOOD RIGHT AC  Final   Special Requests   Final    BOTTLES DRAWN AEROBIC AND ANAEROBIC Blood Culture results may not be optimal due to an excessive volume of blood received in culture bottles   Culture   Final    NO GROWTH 1 DAY Performed at [facility], [address] [place], [place] [address]    Report Status PENDING  Incomplete    Radiology Reports CT HEAD WO CONTRAST (5MM)  Result Date: 09/28/2020 CLINICAL DATA:  Fall.  Mental status changes EXAM: CT HEAD WITHOUT CONTRAST TECHNIQUE: Contiguous axial images were obtained from the base of the skull through the vertex without  intravenous contrast. COMPARISON:  02/22/2018 FINDINGS: Brain: No acute intracranial abnormality. Specifically, no hemorrhage, hydrocephalus, mass lesion, acute infarction, or significant intracranial injury. Vascular: No hyperdense vessel or unexpected calcification. Skull: No acute calvarial abnormality. Sinuses/Orbits: Air-fluid level in the left maxillary sinus. Other: None IMPRESSION: No acute intracranial abnormality. Air-fluid level in the left maxillary sinus, likely acute sinusitis. Electronically Signed   By: Rolm Baptise M.D.   On: 09/28/2020 00:12   CT Cervical Spine Wo Contrast  Result Date: 09/28/2020 CLINICAL DATA:  Fall.  Neck trauma (Age >= 65y) EXAM: CT CERVICAL SPINE WITHOUT CONTRAST TECHNIQUE: Multidetector CT imaging of the cervical spine was performed without intravenous contrast. Multiplanar CT image reconstructions were also generated. COMPARISON:  None. FINDINGS: Alignment: Normal Skull base and vertebrae: No acute fracture. No primary bone lesion or focal pathologic process. Soft tissues and spinal canal: No prevertebral fluid or swelling. No visible canal hematoma. Disc levels:  Maintained Upper chest: Negative Other: None IMPRESSION: Normal study. Electronically Signed   By: Rolm Baptise M.D.   On: 09/28/2020 00:13   DG Chest Portable 1 View  Result Date: 09/27/2020 CLINICAL DATA:  Hypotension EXAM: PORTABLE CHEST 1 VIEW COMPARISON:  03/16/2016 FINDINGS: Heart and mediastinal contours are within normal limits. No focal opacities or effusions. No acute bony abnormality. IMPRESSION: No active disease. Electronically Signed   By: Rolm Baptise M.D.   On: 09/27/2020 23:43
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
## 2020-09-30 LAB — CBC WITH DIFFERENTIAL/PLATELET
Abs Immature Granulocytes: 0.02 10*3/uL (ref 0.00–0.07)
Basophils Absolute: 0 10*3/uL (ref 0.0–0.1)
Basophils Relative: 1 %
Eosinophils Absolute: 0 10*3/uL (ref 0.0–0.5)
Eosinophils Relative: 1 %
HCT: 34.1 % — ABNORMAL LOW (ref 36.0–46.0)
Hemoglobin: 11.4 g/dL — ABNORMAL LOW (ref 12.0–15.0)
Immature Granulocytes: 0 %
Lymphocytes Relative: 44 %
Lymphs Abs: 2.6 10*3/uL (ref 0.7–4.0)
MCH: 29.8 pg (ref 26.0–34.0)
MCHC: 33.4 g/dL (ref 30.0–36.0)
MCV: 89 fL (ref 80.0–100.0)
Monocytes Absolute: 0.6 10*3/uL (ref 0.1–1.0)
Monocytes Relative: 11 %
Neutro Abs: 2.5 10*3/uL (ref 1.7–7.7)
Neutrophils Relative %: 43 %
Platelets: 307 10*3/uL (ref 150–400)
RBC: 3.83 MIL/uL — ABNORMAL LOW (ref 3.87–5.11)
RDW: 13.2 % (ref 11.5–15.5)
WBC: 5.8 10*3/uL (ref 4.0–10.5)
nRBC: 0 % (ref 0.0–0.2)

## 2020-09-30 LAB — COMPREHENSIVE METABOLIC PANEL
ALT: 61 U/L — ABNORMAL HIGH (ref 0–44)
AST: 46 U/L — ABNORMAL HIGH (ref 15–41)
Albumin: 3.8 g/dL (ref 3.5–5.0)
Alkaline Phosphatase: 91 U/L (ref 38–126)
Anion gap: 9 (ref 5–15)
BUN: 11 mg/dL (ref 8–23)
CO2: 25 mmol/L (ref 22–32)
Calcium: 9 mg/dL (ref 8.9–10.3)
Chloride: 106 mmol/L (ref 98–111)
Creatinine, Ser: 0.59 mg/dL (ref 0.44–1.00)
GFR, Estimated: >60 mL/min (ref >60)
Glucose, Bld: 98 mg/dL (ref 70–99)
Potassium: 3.3 mmol/L — ABNORMAL LOW (ref 3.5–5.1)
Sodium: 140 mmol/L (ref 135–145)
Total Bilirubin: 0.6 mg/dL (ref 0.3–1.2)
Total Protein: 6.7 g/dL (ref 6.5–8.1)

## 2020-09-30 LAB — URINE CULTURE: Culture: 40000 — AB

## 2020-09-30 MED ORDER — POTASSIUM CHLORIDE CRYS ER 20 MEQ PO TBCR
40.0000 meq | EXTENDED_RELEASE_TABLET | Freq: Once | ORAL | Status: AC
  Administered 2020-09-30: 40 meq via ORAL
  Filled 2020-09-30: qty 2

## 2020-09-30 NOTE — Discharge Summary (Signed)
Sandra [name] [id number] DOB: [birth year] DOA: 09/27/2020  PCP: Baxter Hire, MD  Admit date: 09/27/2020  Discharge date: 09/30/2020  Admitted From: Home  Disposition:  Home   Recommendations for Outpatient Follow-up:   Follow up with PCP in 1-2 weeks  PCP Please obtain BMP/CBC, 2 view CXR in 1week,  (see Discharge instructions)   PCP Please follow up on the following pending results:    Home Health: None   Equipment/Devices: None  Consultations: None  Discharge Condition: Stable    CODE STATUS: Full    Diet Recommendation: Heart Healthy     Chief Complaint  Patient presents with   Fall     Brief history of present illness from the day of admission and additional interim summary    Brief Narrative: Patient is a 65 y.o. female with PMHx of chronic gait disorder with multiple frequent falls, anxiety/depression, HTN, HLD who presented with worsening weakness, cough, nasal congestion and frequent falls.  She recently had exposure to COVID-19.  Upon further evaluation-she was found to have COVID-19 infection-and hypotension due to volume depletion.  She was subsequently admitted to the hospitalist service.  See below for further details.     COVID-19 vaccinated status: Vaccinated but not boosted.     Significant Events: 8/10>> admit to [facility] for COVID-19 infection-hypotension-severe generalized weakness.   Significant studies: 8/10>> CXR: No pneumonia 8/11>> CT head: No acute abnormalities 8/11>> CT C-spine: No fractures   COVID-19 medications: Remdesivir: 8/10>>8/13                                                                   Hospital Course     COVID-19 infection: Overall improved-complains of some myalgias-no pneumonia on CXR-no hypoxia-due to risk for severe disease-she got  Remdesivir x3 days, she remains symptom-free on room air, will be discharged home with close PCP follow-up.   Hypotension with underlying history of hypertension: Due to volume depletion-resolved.  Resume home medications unchanged.   HLD: Continue statin   Peripheral neuropathy/chronic pain syndrome/chronic gait disorder/frequent falls: Continue Neurontin-narcotics-appreciate PT/OT evaluation.    GERD: Continue PPI  Legally blind.  Supportive care.  Seen by PT OT and cleared for home discharge.  Discharge diagnosis     Active Problems:   Hypotension   COVID-19 virus infection    Discharge instructions    Discharge Instructions     Diet - low sodium heart healthy   Complete by: As directed    Discharge instructions   Complete by: As directed    Follow with Primary MD Baxter Hire, MD in 7 days   Get CBC, CMP, 2 view Chest X ray -  checked next visit within 1 week by Primary MD    Activity: As tolerated with Full fall precautions use walker/cane &  assistance as needed  Disposition Home    Diet: Heart Healthy    Special Instructions: If you have smoked or chewed Tobacco  in the last 2 yrs please stop smoking, stop any regular Alcohol  and or any Recreational drug use.  On your next visit with your primary care physician please Get Medicines reviewed and adjusted.  Please request your Prim.MD to go over all Hospital Tests and Procedure/Radiological results at the follow up, please get all Hospital records sent to your Prim MD by signing hospital release before you go home.  If you experience worsening of your admission symptoms, develop shortness of breath, life threatening emergency, suicidal or homicidal thoughts you must seek medical attention immediately by calling 911 or calling your MD immediately  if symptoms less severe.  You Must read complete instructions/literature along with all the possible adverse reactions/side effects for all the Medicines you take and  that have been prescribed to you. Take any new Medicines after you have completely understood and accpet all the possible adverse reactions/side effects.   Increase activity slowly   Complete by: As directed    MyChart COVID-19 home monitoring program   Complete by: Sep 30, 2020    Is the patient willing to use the [facility] for home monitoring?: Yes   Temperature monitoring   Complete by: Sep 30, 2020    After how many days would you like to receive a notification of this patient's flowsheet entries?: 1       Discharge Medications   Allergies as of 09/30/2020       Reactions   Trazodone Hives, Itching   Acetaminophen-codeine Other (See Comments)   Abdominal and body cramping Abdominal and body cramping   Codeine Other (See Comments)   Stomach pain Stomach pain Stomach pain Stomach pain   Cortisone    Other reaction(s): Unknown   Oxycontin [oxycodone Hcl] Nausea And Vomiting   Trazodone And Nefazodone Hives, Itching   Vicodin [hydrocodone-acetaminophen] Itching   Zolpidem Other (See Comments)   Sleep walking  Ineffective, did not help pt sleep  Sleep walking  Ineffective, did not help pt sleep    Hydrocodone-acetaminophen Itching   Oxycodone-acetaminophen Nausea Only        Medication List     TAKE these medications    acetaminophen 325 MG tablet Commonly known as: TYLENOL Take 650 mg by mouth every 6 (six) hours as needed.   albuterol 108 (90 Base) MCG/ACT inhaler Commonly known as: VENTOLIN HFA Inhale 2 puffs into the lungs every 6 (six) hours as needed for wheezing or shortness of breath.   amitriptyline 25 MG tablet Commonly known as: ELAVIL Take 25 mg by mouth at bedtime.   amLODipine 5 MG tablet Commonly known as: NORVASC Take 5 mg by mouth daily.   aspirin 81 MG tablet Take 81 mg by mouth daily.   cyclobenzaprine 10 MG tablet Commonly known as: FLEXERIL Take 1 tablet (10 mg total) by mouth 3 (three) times daily as needed for muscle  spasms.   esomeprazole 40 MG capsule Commonly known as: NEXIUM Take by mouth.   fluticasone 50 MCG/ACT nasal spray Commonly known as: FLONASE Place 2 sprays into both nostrils daily.   gabapentin 400 MG capsule Commonly known as: NEURONTIN Take 1 capsule (400 mg total) by mouth 2 (two) times daily. 400 mg tid   hydrochlorothiazide 12.5 MG tablet Commonly known as: HYDRODIURIL TAKE ONE TABLET BY MOUTH ONCE DAILY AS NEEDED FOR SWELLING OF FEET  HYDROcodone-acetaminophen 5-325 MG tablet Commonly known as: NORCO/VICODIN Take 1 tablet by mouth every 6 (six) hours as needed for severe pain. Must last 30 days. What changed: Another medication with the same name was removed. Continue taking this medication, and follow the directions you see here.   levocetirizine 5 MG tablet Commonly known as: XYZAL Take 5 mg by mouth every evening.   meclizine 25 MG tablet Commonly known as: ANTIVERT Take 1 tablet (25 mg total) by mouth 3 (three) times daily as needed for dizziness.   Melatonin 10 MG Tabs Take 2 tablets by mouth as needed (TAKES WITH LUNESTA IF THE LUNESTA DOES NOT HELP).   montelukast 10 MG tablet Commonly known as: SINGULAIR Take 10 mg by mouth at bedtime.   promethazine 25 MG tablet Commonly known as: PHENERGAN Take 25 mg by mouth every 6 (six) hours as needed for nausea.   QUEtiapine 50 MG tablet Commonly known as: SEROQUEL Take 50 mg by mouth at bedtime.   sertraline 25 MG tablet Commonly known as: ZOLOFT Take 25 mg by mouth daily.   simvastatin 20 MG tablet Commonly known as: ZOCOR Take 20 mg by mouth at bedtime.   traMADol 100 MG 24 hr tablet Commonly known as: ULTRAM-ER Take 1 tablet (100 mg total) by mouth daily. Start taking on: October 05, 2020   valsartan 160 MG tablet Commonly known as: DIOVAN Take 160 mg by mouth daily.   vitamin B-12 500 MCG tablet Commonly known as: CYANOCOBALAMIN Take 500 mcg by mouth daily.         Follow-up  Information     Baxter Hire, MD. Schedule an appointment as soon as possible for a visit in 1 week(s).   Specialty: Internal Medicine Contact information: [place] [address] [phone]                 Major procedures and Radiology Reports - PLEASE review detailed and final reports thoroughly  -        CT HEAD WO CONTRAST (5MM)  Result Date: 09/28/2020 CLINICAL DATA:  Fall.  Mental status changes EXAM: CT HEAD WITHOUT CONTRAST TECHNIQUE: Contiguous axial images were obtained from the base of the skull through the vertex without intravenous contrast. COMPARISON:  02/22/2018 FINDINGS: Brain: No acute intracranial abnormality. Specifically, no hemorrhage, hydrocephalus, mass lesion, acute infarction, or significant intracranial injury. Vascular: No hyperdense vessel or unexpected calcification. Skull: No acute calvarial abnormality. Sinuses/Orbits: Air-fluid level in the left maxillary sinus. Other: None IMPRESSION: No acute intracranial abnormality. Air-fluid level in the left maxillary sinus, likely acute sinusitis. Electronically Signed   By: Rolm Baptise M.D.   On: 09/28/2020 00:12   CT Cervical Spine Wo Contrast  Result Date: 09/28/2020 CLINICAL DATA:  Fall.  Neck trauma (Age >= 65y) EXAM: CT CERVICAL SPINE WITHOUT CONTRAST TECHNIQUE: Multidetector CT imaging of the cervical spine was performed without intravenous contrast. Multiplanar CT image reconstructions were also generated. COMPARISON:  None. FINDINGS: Alignment: Normal Skull base and vertebrae: No acute fracture. No primary bone lesion or focal pathologic process. Soft tissues and spinal canal: No prevertebral fluid or swelling. No visible canal hematoma. Disc levels:  Maintained Upper chest: Negative Other: None IMPRESSION: Normal study. Electronically Signed   By: Rolm Baptise M.D.   On: 09/28/2020 00:13   DG Chest Portable 1 View  Result Date: 09/27/2020 CLINICAL DATA:  Hypotension EXAM:  PORTABLE CHEST 1 VIEW COMPARISON:  03/16/2016 FINDINGS: Heart and mediastinal contours are within normal limits.  No focal opacities or effusions. No acute bony abnormality. IMPRESSION: No active disease. Electronically Signed   By: Rolm Baptise M.D.   On: 09/27/2020 23:43        Today   Subjective    Tayte Daughtridge today has no headache,no chest abdominal pain,no new weakness tingling or numbness, feels much better wants to go home today.     Objective   Blood pressure (!) 142/79, pulse 81, temperature 98.6 F (37 C), temperature source Oral, resp. rate 20, height '5\' 3"'$  (1.6 m), weight 62.1 kg, SpO2 99 %.   Intake/Output Summary (Last 24 hours) at 09/30/2020 0907 Last data filed at 09/30/2020 0300 Gross per 24 hour  Intake 2506.98 ml  Output --  Net 2506.98 ml    Exam  Awake Alert, No new F.N deficits, legally blind [place].AT,PERRAL Supple Neck,No JVD, No cervical lymphadenopathy appriciated.  Symmetrical Chest wall movement, Good air movement bilaterally, CTAB RRR,No Gallops,Rubs or new Murmurs, No Parasternal Heave +ve B.Sounds, Abd Soft, Non tender, No organomegaly appriciated, No rebound -guarding or rigidity. No Cyanosis, Clubbing or edema, No new Rash or bruise   Data Review   CBC w Diff:  Lab Results  Component Value Date   WBC 5.8 09/30/2020   HGB 11.4 (L) 09/30/2020   HGB 10.8 (L) 09/08/2013   HCT 34.1 (L) 09/30/2020   HCT 34.3 (L) 09/08/2013   PLT 307 09/30/2020   PLT 386 09/08/2013   LYMPHOPCT 44 09/30/2020   MONOPCT 11 09/30/2020   EOSPCT 1 09/30/2020   BASOPCT 1 09/30/2020    CMP:  Lab Results  Component Value Date   NA 140 09/30/2020   NA 137 09/08/2013   K 3.3 (L) 09/30/2020   K 3.4 (L) 09/08/2013   CL 106 09/30/2020   CL 102 09/08/2013   CO2 25 09/30/2020   CO2 28 09/08/2013   BUN 11 09/30/2020   BUN 12 09/08/2013   CREATININE 0.59 09/30/2020   CREATININE 0.87 09/08/2013   PROT 6.7 09/30/2020   PROT 8.0 09/08/2013   ALBUMIN 3.8  09/30/2020   ALBUMIN 3.3 (L) 09/08/2013   BILITOT 0.6 09/30/2020   BILITOT 0.3 09/08/2013   ALKPHOS 91 09/30/2020   ALKPHOS 209 (H) 09/08/2013   AST 46 (H) 09/30/2020   AST 94 (H) 09/08/2013   ALT 61 (H) 09/30/2020   ALT 106 (H) 09/08/2013  .   Total Time in preparing paper work, data evaluation and todays exam - 63 minutes  Lala Lund M.D on 09/30/2020 at 9:07 AM  [facility]

## 2020-09-30 NOTE — Discharge Instructions (Signed)
Follow with Primary MD Sandra Hire, MD in 7 days   Get CBC, CMP, 2 view Chest X ray -  checked next visit within 1 week by Primary MD    Activity: As tolerated with Full fall precautions use walker/cane & assistance as needed  Disposition Home    Diet: Heart Healthy    Special Instructions: If you have smoked or chewed Tobacco  in the last 2 yrs please stop smoking, stop any regular Alcohol  and or any Recreational drug use.  On your next visit with your primary care physician please Get Medicines reviewed and adjusted.  Please request your Prim.MD to go over all Hospital Tests and Procedure/Radiological results at the follow up, please get all Hospital records sent to your Prim MD by signing hospital release before you go home.  If you experience worsening of your admission symptoms, develop shortness of breath, life threatening emergency, suicidal or homicidal thoughts you must seek medical attention immediately by calling 911 or calling your MD immediately  if symptoms less severe.  You Must read complete instructions/literature along with all the possible adverse reactions/side effects for all the Medicines you take and that have been prescribed to you. Take any new Medicines after you have completely understood and accpet all the possible adverse reactions/side effects.

## 2020-10-03 LAB — CULTURE, BLOOD (ROUTINE X 2)
Culture: NO GROWTH
Culture: NO GROWTH
Special Requests: ADEQUATE

## 2020-10-18 ENCOUNTER — Telehealth: Payer: Self-pay | Admitting: Student in an Organized Health Care Education/Training Program

## 2020-10-18 NOTE — Telephone Encounter (Signed)
Returned patient phone call to let her know that she should more Rx's to be filled for her pain medicine.  States that she called the pharmacy after she left the message for [place] and found that she does have more Rx's.

## 2020-10-19 ENCOUNTER — Other Ambulatory Visit: Payer: Self-pay

## 2020-10-19 ENCOUNTER — Emergency Department: Payer: PPO

## 2020-10-19 DIAGNOSIS — Z87891 Personal history of nicotine dependence: Secondary | ICD-10-CM | POA: Insufficient documentation

## 2020-10-19 DIAGNOSIS — S8002XA Contusion of left knee, initial encounter: Secondary | ICD-10-CM | POA: Diagnosis not present

## 2020-10-19 DIAGNOSIS — I10 Essential (primary) hypertension: Secondary | ICD-10-CM | POA: Diagnosis not present

## 2020-10-19 DIAGNOSIS — R0902 Hypoxemia: Secondary | ICD-10-CM | POA: Diagnosis not present

## 2020-10-19 DIAGNOSIS — Z043 Encounter for examination and observation following other accident: Secondary | ICD-10-CM | POA: Diagnosis not present

## 2020-10-19 DIAGNOSIS — Y92009 Unspecified place in unspecified non-institutional (private) residence as the place of occurrence of the external cause: Secondary | ICD-10-CM | POA: Insufficient documentation

## 2020-10-19 DIAGNOSIS — Z79899 Other long term (current) drug therapy: Secondary | ICD-10-CM | POA: Insufficient documentation

## 2020-10-19 DIAGNOSIS — Z8616 Personal history of COVID-19: Secondary | ICD-10-CM | POA: Insufficient documentation

## 2020-10-19 DIAGNOSIS — S0083XA Contusion of other part of head, initial encounter: Secondary | ICD-10-CM | POA: Diagnosis not present

## 2020-10-19 DIAGNOSIS — U071 COVID-19: Secondary | ICD-10-CM | POA: Diagnosis not present

## 2020-10-19 DIAGNOSIS — S161XXA Strain of muscle, fascia and tendon at neck level, initial encounter: Secondary | ICD-10-CM | POA: Diagnosis not present

## 2020-10-19 DIAGNOSIS — S0990XA Unspecified injury of head, initial encounter: Secondary | ICD-10-CM | POA: Diagnosis not present

## 2020-10-19 DIAGNOSIS — G4489 Other headache syndrome: Secondary | ICD-10-CM | POA: Diagnosis not present

## 2020-10-19 DIAGNOSIS — W01198A Fall on same level from slipping, tripping and stumbling with subsequent striking against other object, initial encounter: Secondary | ICD-10-CM | POA: Insufficient documentation

## 2020-10-19 DIAGNOSIS — Z7982 Long term (current) use of aspirin: Secondary | ICD-10-CM | POA: Diagnosis not present

## 2020-10-19 DIAGNOSIS — S199XXA Unspecified injury of neck, initial encounter: Secondary | ICD-10-CM | POA: Diagnosis present

## 2020-10-19 DIAGNOSIS — R059 Cough, unspecified: Secondary | ICD-10-CM | POA: Diagnosis not present

## 2020-10-19 NOTE — ED Triage Notes (Signed)
Pt comes into the ED via EMS from home states she tripped and fell, pt is blind. C/O head and neck pain. Ambulatory  100HR 96%RA 138/74

## 2020-10-19 NOTE — ED Notes (Signed)
Urine sent to lab but not ordered

## 2020-10-20 ENCOUNTER — Emergency Department
Admission: EM | Admit: 2020-10-20 | Discharge: 2020-10-20 | Disposition: A | Discharge status: Home / Self Care | Payer: PPO | Attending: Emergency Medicine | Admitting: Emergency Medicine

## 2020-10-20 DIAGNOSIS — T148XXA Other injury of unspecified body region, initial encounter: Secondary | ICD-10-CM

## 2020-10-20 DIAGNOSIS — S0990XA Unspecified injury of head, initial encounter: Secondary | ICD-10-CM

## 2020-10-20 DIAGNOSIS — S0083XA Contusion of other part of head, initial encounter: Secondary | ICD-10-CM

## 2020-10-20 DIAGNOSIS — W19XXXA Unspecified fall, initial encounter: Secondary | ICD-10-CM

## 2020-10-20 DIAGNOSIS — S8002XA Contusion of left knee, initial encounter: Secondary | ICD-10-CM
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
## 2020-10-20 NOTE — Discharge Instructions (Addendum)

## 2020-10-20 NOTE — ED Notes (Signed)
Patient ambulated with independent/ steady gait (w/ guidance due to visual impairment); MD Karma Greaser notified

## 2020-10-25 DIAGNOSIS — W19XXXD Unspecified fall, subsequent encounter: Secondary | ICD-10-CM | POA: Diagnosis not present

## 2020-10-25 DIAGNOSIS — Z09 Encounter for follow-up examination after completed treatment for conditions other than malignant neoplasm: Secondary | ICD-10-CM | POA: Diagnosis not present

## 2020-10-25 DIAGNOSIS — H5316 Psychophysical visual disturbances: Secondary | ICD-10-CM | POA: Diagnosis not present

## 2020-10-25 DIAGNOSIS — Y92009 Unspecified place in unspecified non-institutional (private) residence as the place of occurrence of the external cause: Secondary | ICD-10-CM | POA: Diagnosis not present

## 2020-10-25 DIAGNOSIS — R918 Other nonspecific abnormal finding of lung field: Secondary | ICD-10-CM | POA: Diagnosis not present

## 2020-10-25 DIAGNOSIS — R42 Dizziness and giddiness: Secondary | ICD-10-CM | POA: Diagnosis not present

## 2020-10-25 DIAGNOSIS — F1129 Opioid dependence with unspecified opioid-induced disorder: Secondary | ICD-10-CM | POA: Diagnosis not present

## 2020-10-25 DIAGNOSIS — U071 COVID-19: Secondary | ICD-10-CM | POA: Diagnosis not present

## 2020-10-25 DIAGNOSIS — I10 Essential (primary) hypertension: Secondary | ICD-10-CM | POA: Diagnosis not present

## 2020-10-25 DIAGNOSIS — Z23 Encounter for immunization: Secondary | ICD-10-CM | POA: Diagnosis not present

## 2020-12-19 ENCOUNTER — Other Ambulatory Visit: Payer: Self-pay

## 2020-12-19 ENCOUNTER — Ambulatory Visit
Payer: PPO | Attending: Student in an Organized Health Care Education/Training Program | Admitting: Student in an Organized Health Care Education/Training Program

## 2020-12-19 ENCOUNTER — Encounter: Payer: Self-pay | Admitting: Student in an Organized Health Care Education/Training Program

## 2020-12-19 DIAGNOSIS — M170 Bilateral primary osteoarthritis of knee: Secondary | ICD-10-CM

## 2020-12-19 DIAGNOSIS — H539 Unspecified visual disturbance: Secondary | ICD-10-CM

## 2020-12-19 DIAGNOSIS — G894 Chronic pain syndrome: Secondary | ICD-10-CM

## 2020-12-19 DIAGNOSIS — M19011 Primary osteoarthritis, right shoulder: Secondary | ICD-10-CM

## 2020-12-19 DIAGNOSIS — M47816 Spondylosis without myelopathy or radiculopathy, lumbar region: Secondary | ICD-10-CM

## 2020-12-19 DIAGNOSIS — M5416 Radiculopathy, lumbar region: Secondary | ICD-10-CM | POA: Diagnosis not present

## 2020-12-19 DIAGNOSIS — G8929 Other chronic pain: Secondary | ICD-10-CM

## 2020-12-19 MED ORDER — HYDROCODONE-ACETAMINOPHEN 5-325 MG PO TABS: 1.0000 | ORAL_TABLET | Freq: Four times a day (QID) | ORAL | 0 refills | Status: AC | PRN

## 2020-12-19 MED ORDER — TRAMADOL HCL ER 100 MG PO TB24: 100.0000 mg | ORAL_TABLET | Freq: Every day | ORAL | 2 refills | Status: AC

## 2020-12-19 NOTE — Progress Notes (Signed)
Patient: Sandra [name]  Service Category: E/M  Provider: Gillis Santa, MD  DOB: 03/10/1955  DOS: 12/19/2020  Location: Office  MRN: [id number]  Setting: Ambulatory outpatient  Referring Provider: Baxter Hire, MD  Type: Established Patient  Specialty: Interventional Pain Management  PCP: Baxter Hire, MD  Location: Remote location  Delivery: TeleHealth     Virtual Encounter - Pain Management PROVIDER NOTE: Information contained herein reflects review and annotations entered in association with encounter. Interpretation of such information and data should be left to medically-trained personnel. Information provided to patient can be located elsewhere in the medical record under "Patient Instructions". Document created using STT-dictation technology, any transcriptional errors that may result from process are unintentional.    Contact & Pharmacy Preferred: [phone] Home: [phone] (home) Mobile: [phone] (mobile) E-mail: No e-mail address on record  [place], [place] [address] [place] [address] Phone: [phone] Fax: [phone]   Pre-screening  Ms. Ruble offered "in-person" vs "virtual" encounter. She indicated preferring virtual for this encounter.   Reason COVID-19*  Social distancing based on CDC and AMA recommendations.   I contacted Katie Faraone on 12/19/2020 via telephone.      I clearly identified myself as Gillis Santa, MD. I verified that I was speaking with the correct person using two identifiers (Name: Kamryn Messineo, and date of birth: [birth year]).  Consent I sought verbal advanced consent from [name] for virtual visit interactions. I informed Ms. Kutter of possible security and privacy concerns, risks, and limitations associated with providing "not-in-person" medical evaluation and management services. I also informed Ms. Dorner of the availability of "in-person" appointments. Finally, I  informed her that there would be a charge for the virtual visit and that she could be  personally, fully or partially, financially responsible for it. Ms. Brisky expressed understanding and agreed to proceed.   Historic Elements   Ms. Samera Macy is a 65 y.o. year old, female patient evaluated today after our last contact on 10/18/2020. Ms. Trine  has a past medical history of Anemia, Anxiety (08/01/2014), Arthritis, degenerative (07/30/2013), Clinical depression (06/30/2014), Depression, Elevated liver enzymes, GERD (gastroesophageal reflux disease), H/O breast biopsy (01/05/2015), H/O: attempted suicide (2013), Hepatitis B, Hiatal hernia, History of hysterectomy (01/05/2015), History of peptic ulcer disease (01/05/2015), Hypercholesteremia, Hypertension, Legally blind, Panic attacks, PUD (peptic ulcer disease), Rectocele (01/05/2015), Retinitis pigmentosa, and S/P cholecystectomy (01/05/2015). She also  has a past surgical history that includes Cholecystectomy; Rectocele repair; Abdominal hysterectomy; Appendectomy; Colonoscopy; Esophagogastroduodenoscopy; Sphincterotomy (N/A, 01/24/2017); Hemorrhoid surgery (N/A, 03/07/2017); Hernia repair; Esophagogastroduodenoscopy (egd) with propofol (N/A, 09/17/2017); Oophorectomy; and Breast biopsy (Bilateral, 1977). Ms. Segel has a current medication list which includes the following prescription(s): [START ON 12/21/2020] hydrocodone-acetaminophen, [START ON 01/20/2021] hydrocodone-acetaminophen, [START ON 02/19/2021] hydrocodone-acetaminophen, acetaminophen, albuterol, amitriptyline, amlodipine, aspirin, cyclobenzaprine, esomeprazole, fluticasone, gabapentin, hydrochlorothiazide, levocetirizine, meclizine, melatonin, montelukast, promethazine, quetiapine, sertraline, simvastatin, [START ON 12/21/2020] tramadol, valsartan, and vitamin b-12. She  reports that she quit smoking about 47 years ago. She has never used smokeless tobacco. She reports that she does not drink  alcohol and does not use drugs. Ms. Parcher is allergic to trazodone, acetaminophen-codeine, codeine, cortisone, oxycontin [oxycodone hcl], trazodone and nefazodone, vicodin [hydrocodone-acetaminophen], zolpidem, hydrocodone-acetaminophen, and oxycodone-acetaminophen.   HPI  Today, she is being contacted for medication management.  We are doing a virtual visit as the patient is not feeling well.  She is endorsing URI symptoms including cough, chills, body aches.  She had COVID approximately  2 months ago.  She has not been tested for COVID given her most recent symptoms.  I have encouraged her to follow-up with her primary care provider and to at least get a COVID test done.  Otherwise we will refill her chronic pain medications as below including hydrocodone and tramadol 100 mg ER.  No change in dose there.   Pharmacotherapy Assessment   Analgesic: Hydrocodone 5 mg every 6 hours as needed, quantity 120/month; MME equals 20.  Tramadol 100 mg ER daily for long-acting pain relief .    Monitoring: [place] PMP: PDMP reviewed during this encounter.       Pharmacotherapy: No side-effects or adverse reactions reported. Compliance: No problems identified. Effectiveness: Clinically acceptable. Plan: Refer to "POC". UDS:  Summary  Date Value Ref Range Status  01/04/2020 Note  Final    Comment:    ==================================================================== [employer] Select 13 (MW) ==================================================================== Test                             Result       Flag       Units  Drug Present and Declared for Prescription Verification   Tramadol                       >1404        EXPECTED   ng/mg creat   O-Desmethyltramadol            >1404        EXPECTED   ng/mg creat   N-Desmethyltramadol            >1404        EXPECTED   ng/mg creat    Source of tramadol is a prescription medication. O-desmethyltramadol    and N-desmethyltramadol are expected metabolites  of tramadol.  Drug Absent but Declared for Prescription Verification   Temazepam                      Not Detected UNEXPECTED ng/mg creat ==================================================================== Test                      Result    Flag   Units      Ref Range   Creatinine              356              mg/dL      >=20 ==================================================================== Declared Medications:  The flagging and interpretation on this report are based on the  following declared medications.  Unexpected results may arise from  inaccuracies in the declared medications.   **Note: The testing scope of this panel includes these medications:   Temazepam  Tramadol   **Note: The testing scope of this panel does not include the  following reported medications:   Acetaminophen  Albuterol  Amitriptyline  Amlodipine (Lotrel)  Amlodipine  Aspirin  Benazepril (Lotrel)  Cyanocobalamin  Cyclobenzaprine  Dextromethorphan  Esomeprazole (Nexium)  Famotidine  Fluticasone  Gabapentin  Guaifenesin  Hydrochlorothiazide  Hydroxyzine  Levocetirizine (Xyzal)  Meclizine  Melatonin  Montelukast  Naproxen  Ondansetron  Promethazine  Sertraline  Simvastatin  Sucralfate ==================================================================== For clinical consultation, please call [phone]. ====================================================================      Laboratory Chemistry Profile   Renal Lab Results  Component Value Date   BUN 11 09/30/2020   CREATININE 0.59 09/30/2020   GFRAA >60 01/16/2017   GFRNONAA >  60 09/30/2020    Hepatic Lab Results  Component Value Date   AST 46 (H) 09/30/2020   ALT 61 (H) 09/30/2020   ALBUMIN 3.8 09/30/2020   ALKPHOS 91 09/30/2020   AMMONIA 31 09/28/2020    Electrolytes Lab Results  Component Value Date   NA 140 09/30/2020   K 3.3 (L) 09/30/2020   CL 106 09/30/2020   CALCIUM 9.0 09/30/2020   MG 2.0  09/27/2020    Bone Lab Results  Component Value Date   25OHVITD1 19 (L) 02/07/2015   25OHVITD2 3.9 02/07/2015   25OHVITD3 15 02/07/2015    Inflammation (CRP: Acute Phase) (ESR: Chronic Phase) Lab Results  Component Value Date   CRP 1.3 (H) 09/27/2020   ESRSEDRATE 26 02/07/2015   LATICACIDVEN 1.1 09/27/2020         Note: Above Lab results reviewed.  Imaging  CT Cervical Spine Wo Contrast CLINICAL DATA:  Status post fall.  EXAM: CT CERVICAL SPINE WITHOUT CONTRAST  TECHNIQUE: Multidetector CT imaging of the cervical spine was performed without intravenous contrast. Multiplanar CT image reconstructions were also generated.  COMPARISON:  September 27, 2020  FINDINGS: Alignment: Normal.  Skull base and vertebrae: No acute fracture. No primary bone lesion or focal pathologic process.  Soft tissues and spinal canal: No prevertebral fluid or swelling. No visible canal hematoma.  Disc levels: Normal multilevel endplates are seen with normal multilevel intervertebral disc spaces.  Mild, bilateral multilevel facet joint hypertrophy is noted.  Upper chest: A 5 mm benign-appearing bone island is noted within the first right rib.  Other: None.  IMPRESSION: 1. No acute fracture or subluxation of the cervical spine. 2. Mild, bilateral multilevel facet joint hypertrophy.  Electronically Signed   By: Virgina Norfolk M.D.   On: 10/19/2020 17:13 CT HEAD WO CONTRAST (5MM) CLINICAL DATA:  Status post fall.  EXAM: CT HEAD WITHOUT CONTRAST  TECHNIQUE: Contiguous axial images were obtained from the base of the skull through the vertex without intravenous contrast.  COMPARISON:  September 27, 2020  FINDINGS: Brain: No evidence of acute infarction, hemorrhage, hydrocephalus, extra-axial collection or mass lesion/mass effect.  Vascular: No hyperdense vessel or unexpected calcification.  Skull: Normal. Negative for fracture or focal lesion.  Sinuses/Orbits: There is a  small to moderate size left maxillary sinus air-fluid level  Other: None.  IMPRESSION: 1. No acute intracranial abnormality. 2. Small to moderate size left maxillary sinus air-fluid level.  Electronically Signed   By: Virgina Norfolk M.D.   On: 10/19/2020 17:11  Assessment  The primary encounter diagnosis was Chronic radicular lumbar pain. Diagnoses of Localized osteoarthritis of right shoulder, Lumbar facet syndrome (Bilateral) (R>L), Lumbar facet arthropathy, Bilateral primary osteoarthritis of knee, Impaired visual perception, and Chronic pain syndrome were also pertinent to this visit.  Plan of Care    Ms. Anuja Manka has a current medication list which includes the following long-term medication(s): albuterol, amitriptyline, cyclobenzaprine, esomeprazole, fluticasone, gabapentin, hydrochlorothiazide, levocetirizine, montelukast, promethazine, quetiapine, simvastatin, and valsartan.  Pharmacotherapy (Medications Ordered): Meds ordered this encounter  Medications   traMADol (ULTRAM-ER) 100 MG 24 hr tablet    Sig: Take 1 tablet (100 mg total) by mouth daily.    Dispense:  30 tablet    Refill:  2    For chronic pain syndrome   HYDROcodone-acetaminophen (NORCO/VICODIN) 5-325 MG tablet    Sig: Take 1 tablet by mouth every 6 (six) hours as needed for severe pain. Must last 30 days.    Dispense:  120 tablet  Refill:  0    Chronic Pain: STOP Act (Not applicable) Fill 1 day early if closed on refill date. Avoid benzodiazepines within 8 hours of opioids   HYDROcodone-acetaminophen (NORCO/VICODIN) 5-325 MG tablet    Sig: Take 1 tablet by mouth every 6 (six) hours as needed for severe pain. Must last 30 days.    Dispense:  120 tablet    Refill:  0    Chronic Pain: STOP Act (Not applicable) Fill 1 day early if closed on refill date. Avoid benzodiazepines within 8 hours of opioids   HYDROcodone-acetaminophen (NORCO/VICODIN) 5-325 MG tablet    Sig: Take 1 tablet by mouth every 6  (six) hours as needed for severe pain. Must last 30 days.    Dispense:  120 tablet    Refill:  0    Chronic Pain: STOP Act (Not applicable) Fill 1 day early if closed on refill date. Avoid benzodiazepines within 8 hours of opioids     Follow-up plan:   Return in about 3 months (around 03/21/2021) for Medication Management, in person.     Status post bilateral intra-articular knee steroid, right elbow injection on 04/05/2019, 03/01/20        Recent Visits Date Type Provider Dept  09/21/20 Telemedicine Gillis Santa, MD [facility]  Showing recent visits within past 90 days and meeting all other requirements Today's Visits Date Type Provider Dept  12/19/20 Office Visit Gillis Santa, MD [facility]  Showing today's visits and meeting all other requirements Future Appointments No visits were found meeting these conditions. Showing future appointments within next 90 days and meeting all other requirements I discussed the assessment and treatment plan with the patient. The patient was provided an opportunity to ask questions and all were answered. The patient agreed with the plan and demonstrated an understanding of the instructions.  Patient advised to call back or seek an in-person evaluation if the symptoms or condition worsens.  Duration of encounter: 62mnutes.  Note by: BGillis Santa MD Date: 12/19/2020; Time: 1:21 PM

## 2021-01-05 ENCOUNTER — Other Ambulatory Visit: Payer: Self-pay

## 2021-01-05 ENCOUNTER — Ambulatory Visit
Admission: RE | Admit: 2021-01-05 | Discharge: 2021-01-05 | Disposition: A | Discharge status: Home / Self Care | Payer: PPO | Source: Ambulatory Visit | Attending: Internal Medicine | Admitting: Internal Medicine

## 2021-01-05 DIAGNOSIS — Z1231 Encounter for screening mammogram for malignant neoplasm of breast: Secondary | ICD-10-CM | POA: Diagnosis not present

## 2021-02-20 ENCOUNTER — Telehealth: Payer: Self-pay

## 2021-02-20 DIAGNOSIS — G894 Chronic pain syndrome: Secondary | ICD-10-CM

## 2021-02-20 DIAGNOSIS — G8929 Other chronic pain: Secondary | ICD-10-CM

## 2021-02-20 MED ORDER — CYCLOBENZAPRINE HCL 10 MG PO TABS: 10.0000 mg | ORAL_TABLET | Freq: Three times a day (TID) | ORAL | 5 refills | Status: DC | PRN

## 2021-02-20 NOTE — Telephone Encounter (Signed)
Patient is needing Flexeril. Did not see that it was ordered at her last visit.

## 2021-02-20 NOTE — Telephone Encounter (Signed)
Called pat to let her know that Flexerill was sent to her pharm. Patient transferred to front to make MM appt.

## 2021-02-20 NOTE — Telephone Encounter (Signed)
She states her flexoril script is not at her pharmacy.

## 2021-03-22 ENCOUNTER — Encounter: Payer: Self-pay | Admitting: Student in an Organized Health Care Education/Training Program

## 2021-03-22 ENCOUNTER — Other Ambulatory Visit: Payer: Self-pay

## 2021-03-22 ENCOUNTER — Ambulatory Visit
Payer: Medicare PPO | Attending: Student in an Organized Health Care Education/Training Program | Admitting: Student in an Organized Health Care Education/Training Program

## 2021-03-22 VITALS — BP 126/68 | HR 87 | Temp 97.3°F | Resp 18 | Ht <= 58 in | Wt 148.0 lb

## 2021-03-22 DIAGNOSIS — M47816 Spondylosis without myelopathy or radiculopathy, lumbar region: Secondary | ICD-10-CM | POA: Diagnosis not present

## 2021-03-22 DIAGNOSIS — G8929 Other chronic pain: Secondary | ICD-10-CM | POA: Diagnosis present

## 2021-03-22 DIAGNOSIS — M170 Bilateral primary osteoarthritis of knee: Secondary | ICD-10-CM | POA: Diagnosis not present

## 2021-03-22 DIAGNOSIS — M5416 Radiculopathy, lumbar region: Secondary | ICD-10-CM

## 2021-03-22 DIAGNOSIS — M19011 Primary osteoarthritis, right shoulder: Secondary | ICD-10-CM | POA: Insufficient documentation

## 2021-03-22 DIAGNOSIS — M79604 Pain in right leg: Secondary | ICD-10-CM | POA: Insufficient documentation

## 2021-03-22 DIAGNOSIS — H539 Unspecified visual disturbance: Secondary | ICD-10-CM | POA: Diagnosis present

## 2021-03-22 DIAGNOSIS — M79605 Pain in left leg: Secondary | ICD-10-CM | POA: Diagnosis present

## 2021-03-22 DIAGNOSIS — G894 Chronic pain syndrome: Secondary | ICD-10-CM | POA: Diagnosis present

## 2021-03-22 DIAGNOSIS — H5316 Psychophysical visual disturbances: Secondary | ICD-10-CM | POA: Diagnosis present

## 2021-03-22 MED ORDER — HYDROCODONE-ACETAMINOPHEN 5-325 MG PO TABS: 1.0000 | ORAL_TABLET | Freq: Four times a day (QID) | ORAL | 0 refills | Status: DC | PRN

## 2021-03-22 MED ORDER — GABAPENTIN 400 MG PO CAPS: 400.0000 mg | ORAL_CAPSULE | Freq: Two times a day (BID) | ORAL | 5 refills | Status: DC

## 2021-03-22 MED ORDER — TRAMADOL HCL ER 100 MG PO TB24: 100.0000 mg | ORAL_TABLET | Freq: Every day | ORAL | 2 refills | Status: DC

## 2021-03-22 MED ORDER — HYDROCODONE-ACETAMINOPHEN 5-325 MG PO TABS: 1.0000 | ORAL_TABLET | Freq: Four times a day (QID) | ORAL | 0 refills | Status: AC | PRN

## 2021-03-22 NOTE — Progress Notes (Signed)
PROVIDER NOTE: Information contained herein reflects review and annotations entered in association with encounter. Interpretation of such information and data should be left to medically-trained personnel. Information provided to patient can be located elsewhere in the medical record under "Patient Instructions". Document created using STT-dictation technology, any transcriptional errors that may result from process are unintentional.    Patient: Sandra [name]  Service Category: E/M  Provider: Gillis Santa, MD  DOB: November 15, 1955  DOS: 03/22/2021  Specialty: Interventional Pain Management  MRN: [id number]  Setting: Ambulatory outpatient  PCP: Baxter Hire, MD  Type: Established Patient    Referring Provider: Baxter Hire, MD  Location: Office  Delivery: Face-to-face     HPI  Ms. Nastasia Kage, a 66 y.o. year old female, is here today because of her Chronic radicular lumbar pain [M54.16, G89.29]. Ms. Auletta's primary complain today is Back Pain (lower) Last encounter: My last encounter with her was on 12/19/20  Pertinent problems: Ms. Dalal has Long term current use of opiate analgesic; Long term prescription opiate use; Opiate use; Hallucinations, visual; Psychiatric disorder; Chronic low back pain (Location of Primary Source of Pain) (Bilateral) (R>L); Chronic bilateral knee pain; Opiate dependence ([place]); Lumbar facet arthropathy; Lumbar spondylosis; Lumbar facet syndrome (Bilateral) (R>L); Diffuse myofascial pain syndrome; [name] (CBS); Arthropathy of right elbow; and Localized osteoarthritis of right shoulder on their pertinent problem list. Pain Assessment: Severity of Chronic pain is reported as a 8 /10. Location: Back Lower/both legs to the feet. Onset: More than a month ago. Quality: Other (Comment) (strong). Timing: Intermittent. Modifying factor(s): massage. Vitals:  height is _0  (1.473 m) and weight is 148 lb (67.1 kg). Her temporal temperature is 97.3 F (36.3  C) (abnormal). Her blood pressure is 126/68 and her pulse is 87. Her respiration is 18 and oxygen saturation is 97%.   Reason for encounter: medication management.    No change in medical history. Incresaed lower back pain.  Patient continues multimodal pain regimen as prescribed with acetaminophenwith APAP, Amitriptyline, Tramadol 100 mg ER and Hydrocodone for breakthrough pain.  Patient is noticing benefit with the addition of tramadol ER in  her overall chronic pain.  States that it provides pain relief and improvement in functional status.   Pharmacotherapy Assessment   Analgesic: Hydrocodone 5 mg every 6 hours as needed, quantity 120/month; MME equals 20. Tramadol 100 mg ER daily for long-acting pain relief .    Monitoring: [place] PMP: PDMP reviewed during this encounter.       Pharmacotherapy: No side-effects or adverse reactions reported. Compliance: No problems identified. Effectiveness: Clinically acceptable.  Landis Martins, RN  03/22/2021  1:43 PM  Sign when Signing Visit Nursing Pain Medication Assessment:  Safety precautions to be maintained throughout the outpatient stay will include: orient to surroundings, keep bed in low position, maintain call bell within reach at all times, provide assistance with transfer out of bed and ambulation.  Medication Inspection Compliance: Pill count conducted under aseptic conditions, in front of the patient. Neither the pills nor the bottle was removed from the patient's sight at any time. Once count was completed pills were immediately returned to the patient in their original bottle.  Medication: Hydrocodone/APAP Pill/Patch Count:  19 of 120 pills remain Pill/Patch Appearance: Markings consistent with prescribed medication Bottle Appearance: Standard pharmacy container. Clearly labeled. Filled Date: 01 / 03 / 2023 Last Medication intake:  Yesterday   UDS:  Summary  Date Value Ref Range Status  01/04/2020 Note  Final  Comment:     ==================================================================== Tox[employer] Select 13 (MW) ==================================================================== Test                             Result       Flag       Units  Drug Present and Declared for Prescription Verification   Tramadol                       >1404        EXPECTED   ng/mg creat   O-Desmethyltramadol            >1404        EXPECTED   ng/mg creat   N-Desmethyltramadol            >1404        EXPECTED   ng/mg creat    Source of tramadol is a prescription medication. O-desmethyltramadol    and N-desmethyltramadol are expected metabolites of tramadol.  Drug Absent but Declared for Prescription Verification   Temazepam                      Not Detected UNEXPECTED ng/mg creat ==================================================================== Test                      Result    Flag   Units      Ref Range   Creatinine              356              mg/dL      >=20 ==================================================================== Declared Medications:  The flagging and interpretation on this report are based on the  following declared medications.  Unexpected results may arise from  inaccuracies in the declared medications.   **Note: The testing scope of this panel includes these medications:   Temazepam  Tramadol   **Note: The testing scope of this panel does not include the  following reported medications:   Acetaminophen  Albuterol  Amitriptyline  Amlodipine (Lotrel)  Amlodipine  Aspirin  Benazepril (Lotrel)  Cyanocobalamin  Cyclobenzaprine  Dextromethorphan  Esomeprazole (Nexium)  Famotidine  Fluticasone  Gabapentin  Guaifenesin  Hydrochlorothiazide  Hydroxyzine  Levocetirizine (Xyzal)  Meclizine  Melatonin  Montelukast  Naproxen  Ondansetron  Promethazine  Sertraline  Simvastatin  Sucralfate ==================================================================== For clinical consultation,  please call [phone]. ====================================================================      ROS  Constitutional: Denies any fever or chills Gastrointestinal: No reported hemesis, hematochezia, vomiting, or acute GI distress Musculoskeletal:  bilateral hip pain  , low back pain Neurological: No reported episodes of acute onset apraxia, aphasia, dysarthria, agnosia, amnesia, paralysis, loss of coordination, or loss of consciousness  Medication Review  HYDROcodone-acetaminophen, Melatonin, QUEtiapine, acetaminophen, albuterol, amLODipine, amitriptyline, aspirin, cyclobenzaprine, esomeprazole, famotidine, fluticasone, gabapentin, hydrochlorothiazide, levocetirizine, meclizine, montelukast, promethazine, simvastatin, traMADol, valsartan, and vitamin B-12  History Review  Allergy: Ms. Briley is allergic to trazodone, codeine, cortisone, oxycontin [oxycodone hcl], trazodone and nefazodone, zolpidem, hydrocodone-acetaminophen, and oxycodone-acetaminophen. Drug: Ms. Macfadden  reports no history of drug use. Alcohol:  reports no history of alcohol use. Tobacco:  reports that she quit smoking about 47 years ago. She has never used smokeless tobacco. Social: Ms. Henrikson  reports that she quit smoking about 47 years ago. She has never used smokeless tobacco. She reports that she does not drink alcohol and does not use drugs. Medical:  has a past medical history  of Anemia, Anxiety (08/01/2014), Arthritis, degenerative (07/30/2013), Clinical depression (06/30/2014), Depression, Elevated liver enzymes, GERD (gastroesophageal reflux disease), H/O breast biopsy (01/05/2015), H/O: attempted suicide (2013), Hepatitis B, Hiatal hernia, History of hysterectomy (01/05/2015), History of peptic ulcer disease (01/05/2015), Hypercholesteremia, Hypertension, Legally blind, Panic attacks, PUD (peptic ulcer disease), Rectocele (01/05/2015), Retinitis pigmentosa, and S/P cholecystectomy (01/05/2015). Surgical: Ms.  Boss  has a past surgical history that includes Cholecystectomy; Rectocele repair; Abdominal hysterectomy; Appendectomy; Colonoscopy; Esophagogastroduodenoscopy; Sphincterotomy (N/A, 01/24/2017); Hemorrhoid surgery (N/A, 03/07/2017); Hernia repair; Esophagogastroduodenoscopy (egd) with propofol (N/A, 09/17/2017); Oophorectomy; and Breast biopsy (Bilateral, 1977). Family: family history includes Breast cancer in her sister; Cancer in her father; Dementia in her mother; Diabetes in her mother; Hypertension in her mother; Prostate cancer in her father.  Laboratory Chemistry Profile   Renal Lab Results  Component Value Date   BUN 11 09/30/2020   CREATININE 0.59 09/30/2020   GFRAA >60 01/16/2017   GFRNONAA >60 09/30/2020     Hepatic Lab Results  Component Value Date   AST 46 (H) 09/30/2020   ALT 61 (H) 09/30/2020   ALBUMIN 3.8 09/30/2020   ALKPHOS 91 09/30/2020   AMMONIA 31 09/28/2020     Electrolytes Lab Results  Component Value Date   NA 140 09/30/2020   K 3.3 (L) 09/30/2020   CL 106 09/30/2020   CALCIUM 9.0 09/30/2020   MG 2.0 09/27/2020     Bone Lab Results  Component Value Date   25OHVITD1 19 (L) 02/07/2015   25OHVITD2 3.9 02/07/2015   25OHVITD3 15 02/07/2015     Inflammation (CRP: Acute Phase) (ESR: Chronic Phase) Lab Results  Component Value Date   CRP 1.3 (H) 09/27/2020   ESRSEDRATE 26 02/07/2015   LATICACIDVEN 1.1 09/27/2020       Note: Above Lab results reviewed.  Physical Exam  General appearance: Well nourished, well developed, and well hydrated. In no apparent acute distress Mental status: Alert, oriented x 3 (person, place, & time)       Respiratory: No evidence of acute respiratory distress Eyes: PERLA Vitals: BP 126/68    Pulse 87    Temp (!) 97.3 F (36.3 C) (Temporal)    Resp 18    Ht _0  (1.473 m)    Wt 148 lb (67.1 kg)    SpO2 97%    BMI 30.93 kg/m  BMI: Estimated body mass index is 30.93 kg/m as calculated from the following:   Height as  of this encounter: _1  (1.473 m).   Weight as of this encounter: 148 lb (67.1 kg). Ideal: Patient must be at least 60 in tall to calculate ideal body weight   Lumbar Spine Area Exam  Skin & Axial Inspection: No masses, redness, or swelling Alignment: Symmetrical Functional ROM: Pain restricted ROM       Stability: No instability detected Muscle Tone/Strength: Functionally intact. No obvious neuro-muscular anomalies detected. Sensory (Neurological): Musculoskeletal pain pattern   Gait & Posture Assessment  Ambulation: Patient came in today in a wheel chair Gait: Antalgic gait (limping) Posture: Difficulty standing up straight, due to pain  Lower Extremity Exam      Side: Right lower extremity   Side: Left lower extremity  Stability: No instability observed           Stability: No instability observed          Skin & Extremity Inspection: Skin color, temperature, and hair growth are WNL. No peripheral edema or cyanosis. No masses, redness, swelling, asymmetry, or associated skin lesions.  No contractures.   Skin & Extremity Inspection: Skin color, temperature, and hair growth are WNL. No peripheral edema or cyanosis. No masses, redness, swelling, asymmetry, or associated skin lesions. No contractures.  Functional ROM: Pain restricted ROM for hip and knee joints           Functional ROM: Pain restricted ROM for hip and knee joints          Muscle Tone/Strength: Functionally intact. No obvious neuro-muscular anomalies detected.   Muscle Tone/Strength: Functionally intact. No obvious neuro-muscular anomalies detected.  Sensory (Neurological): Musculoskeletal pain pattern         Sensory (Neurological): Musculoskeletal pain pattern        DTR: Patellar: deferred today Achilles: deferred today Plantar: deferred today   DTR: Patellar: deferred today Achilles: deferred today Plantar: deferred today  Palpation: No palpable anomalies   Palpation: No palpable anomalies     Assessment    Status Diagnosis  Controlled Controlled Controlled 1. Chronic radicular lumbar pain   2. Localized osteoarthritis of right shoulder   3. Lumbar facet syndrome (Bilateral) (R>L)   4. Lumbar facet arthropathy   5. Bilateral primary osteoarthritis of knee   6. Impaired visual perception   7. [name] (CBS)   8. Chronic lower extremity pain (Bilateral)   9. Chronic pain syndrome       Plan of Care   Ms. Korin Setzler has a current medication list which includes the following long-term medication(s): albuterol, amitriptyline, cyclobenzaprine, fluticasone, hydrochlorothiazide, levocetirizine, montelukast, promethazine, quetiapine, simvastatin, valsartan, esomeprazole, and gabapentin.  Pharmacotherapy (Medications Ordered): Meds ordered this encounter  Medications   HYDROcodone-acetaminophen (NORCO/VICODIN) 5-325 MG tablet    Sig: Take 1 tablet by mouth every 6 (six) hours as needed for severe pain. Must last 30 days.    Dispense:  120 tablet    Refill:  0    Chronic Pain: STOP Act (Not applicable) Fill 1 day early if closed on refill date. Avoid benzodiazepines within 8 hours of opioids   HYDROcodone-acetaminophen (NORCO/VICODIN) 5-325 MG tablet    Sig: Take 1 tablet by mouth every 6 (six) hours as needed for severe pain. Must last 30 days.    Dispense:  120 tablet    Refill:  0    Chronic Pain: STOP Act (Not applicable) Fill 1 day early if closed on refill date. Avoid benzodiazepines within 8 hours of opioids   HYDROcodone-acetaminophen (NORCO/VICODIN) 5-325 MG tablet    Sig: Take 1 tablet by mouth every 6 (six) hours as needed for severe pain. Must last 30 days.    Dispense:  120 tablet    Refill:  0    Chronic Pain: STOP Act (Not applicable) Fill 1 day early if closed on refill date. Avoid benzodiazepines within 8 hours of opioids   gabapentin (NEURONTIN) 400 MG capsule    Sig: Take 1 capsule (400 mg total) by mouth 2 (two) times daily. 400 mg tid    Dispense:   60 capsule    Refill:  5   traMADol (ULTRAM-ER) 100 MG 24 hr tablet    Sig: Take 1 tablet (100 mg total) by mouth daily.    Dispense:  30 tablet    Refill:  2   Continue multimodal analgesics with acetaminophen, amitriptyline,as prescribed. No refills needed on those.  Orders Placed This Encounter  Procedures   ToxASSURE Select 13 (MW), Urine    Volume: 30 ml(s). Minimum 3 ml of urine is needed. Document temperature of fresh sample. Indications: Long  term (current) use of opiate analgesic [phone])    Order Specific Question:   Release to patient    Answer:   Immediate     Follow-up plan:   Return in about 3 months (around 06/19/2021) for Medication Management, in person.     Status post bilateral intra-articular knee steroid, right elbow injection on 04/05/2019, 03/01/20      Recent Visits No visits were found meeting these conditions. Showing recent visits within past 90 days and meeting all other requirements Today's Visits Date Type Provider Dept  03/22/21 Office Visit Gillis Santa, MD [facility]  Showing today's visits and meeting all other requirements Future Appointments No visits were found meeting these conditions. Showing future appointments within next 90 days and meeting all other requirements  I discussed the assessment and treatment plan with the patient. The patient was provided an opportunity to ask questions and all were answered. The patient agreed with the plan and demonstrated an understanding of the instructions.  Patient advised to call back or seek an in-person evaluation if the symptoms or condition worsens.  Duration of encounter: 15mnutes.  Note by: BGillis Santa MD Date: 03/22/2021; Time: 2:05 PM

## 2021-03-22 NOTE — Progress Notes (Signed)
Nursing Pain Medication Assessment:  Safety precautions to be maintained throughout the outpatient stay will include: orient to surroundings, keep bed in low position, maintain call bell within reach at all times, provide assistance with transfer out of bed and ambulation.  Medication Inspection Compliance: Pill count conducted under aseptic conditions, in front of the patient. Neither the pills nor the bottle was removed from the patient's sight at any time. Once count was completed pills were immediately returned to the patient in their original bottle.  Medication: Hydrocodone/APAP Pill/Patch Count:  19 of 120 pills remain Pill/Patch Appearance: Markings consistent with prescribed medication Bottle Appearance: Standard pharmacy container. Clearly labeled. Filled Date: 01 / 03 / 2023 Last Medication intake:  Yesterday

## 2021-03-29 LAB — TOXASSURE SELECT 13 (MW), URINE

## 2021-04-13 ENCOUNTER — Telehealth: Payer: Self-pay | Admitting: *Deleted

## 2021-04-13 NOTE — Telephone Encounter (Signed)
Patient left a message asking for someone to call her back. No specifics in the message. I attempted to call her back, no answer, no voicemail.

## 2021-04-16 ENCOUNTER — Telehealth: Payer: Self-pay | Admitting: Student in an Organized Health Care Education/Training Program

## 2021-04-16 NOTE — Telephone Encounter (Signed)
Patient has a phone appt tomorrow to discuss Flexiril dosage. She had to change pharmacy due to her other one closing. [facility] now uses [facility] [address].

## 2021-04-17 ENCOUNTER — Encounter: Payer: Self-pay | Admitting: Student in an Organized Health Care Education/Training Program

## 2021-04-17 ENCOUNTER — Other Ambulatory Visit: Payer: Self-pay

## 2021-04-17 ENCOUNTER — Ambulatory Visit
Payer: Medicare PPO | Attending: Student in an Organized Health Care Education/Training Program | Admitting: Student in an Organized Health Care Education/Training Program

## 2021-04-17 DIAGNOSIS — M5416 Radiculopathy, lumbar region: Secondary | ICD-10-CM | POA: Diagnosis not present

## 2021-04-17 DIAGNOSIS — G894 Chronic pain syndrome: Secondary | ICD-10-CM

## 2021-04-17 DIAGNOSIS — G8929 Other chronic pain: Secondary | ICD-10-CM

## 2021-04-17 MED ORDER — METHOCARBAMOL 500 MG PO TABS: 500.0000 mg | ORAL_TABLET | Freq: Three times a day (TID) | ORAL | 2 refills | Status: DC | PRN

## 2021-04-17 MED ORDER — HYDROCODONE-ACETAMINOPHEN 5-325 MG PO TABS: 1.0000 | ORAL_TABLET | Freq: Four times a day (QID) | ORAL | 0 refills | Status: DC | PRN

## 2021-04-17 MED ORDER — TRAMADOL HCL ER 100 MG PO TB24: 100.0000 mg | ORAL_TABLET | Freq: Every day | ORAL | 1 refills | Status: DC

## 2021-04-17 MED ORDER — HYDROCODONE-ACETAMINOPHEN 5-325 MG PO TABS: 1.0000 | ORAL_TABLET | Freq: Four times a day (QID) | ORAL | 0 refills | Status: AC | PRN

## 2021-04-17 MED ORDER — GABAPENTIN 400 MG PO CAPS: 400.0000 mg | ORAL_CAPSULE | Freq: Two times a day (BID) | ORAL | 5 refills | Status: DC

## 2021-04-17 NOTE — Progress Notes (Signed)
Patient: Sandra [name]  Service Category: E/M  Provider: Gillis Santa, MD  DOB: 12/29/1955  DOS: 04/17/2021  Location: Office  MRN: [id number]  Setting: Ambulatory outpatient  Referring Provider: Baxter Hire, MD  Type: Established Patient  Specialty: Interventional Pain Management  PCP: Baxter Hire, MD  Location: Remote location  Delivery: TeleHealth     Virtual Encounter - Pain Management PROVIDER NOTE: Information contained herein reflects review and annotations entered in association with encounter. Interpretation of such information and data should be left to medically-trained personnel. Information provided to patient can be located elsewhere in the medical record under "Patient Instructions". Document created using STT-dictation technology, any transcriptional errors that may result from process are unintentional.    Contact & Pharmacy Preferred: [phone] Home: [phone] (home) Mobile: [phone] (mobile) E-mail: No e-mail address on record  [place], [place] [address] Phone: [phone] Fax: [place], [place] - [place] [address] [facility] [address] [place] [address] Phone: [phone] Fax: [phone]   Pre-screening  Sandra [name] offered "in-person" vs "virtual" encounter. She indicated preferring virtual for this encounter.   Reason COVID-19*   Social distancing based on CDC and AMA recommendations.   I contacted Zahari Fazzino on 04/17/2021 via telephone.      I clearly identified myself as Gillis Santa, MD. I verified that I was speaking with the correct person using two identifiers (Name: Gesselle Fitzsimons, and date of birth: Feb 17, 1956).  Consent I sought verbal advanced consent from [name] for virtual visit interactions. I informed Sandra [name] of possible security and privacy concerns, risks, and limitations associated with  providing "not-in-person" medical evaluation and management services. I also informed Sandra [name] of the availability of "in-person" appointments. Finally, I informed her that there would be a charge for the virtual visit and that she could be  personally, fully or partially, financially responsible for it. Sandra [name] expressed understanding and agreed to proceed.   Historic Elements   Sandra [name] is a 66 y.o. year old, female patient evaluated today after our last contact on 04/16/2021. Sandra [name]  has a past medical history of Anemia, Anxiety (08/01/2014), Arthritis, degenerative (07/30/2013), Clinical depression (06/30/2014), Depression, Elevated liver enzymes, GERD (gastroesophageal reflux disease), H/O breast biopsy (01/05/2015), H/O: attempted suicide (2013), Hepatitis B, Hiatal hernia, History of hysterectomy (01/05/2015), History of peptic ulcer disease (01/05/2015), Hypercholesteremia, Hypertension, Legally blind, Panic attacks, PUD (peptic ulcer disease), Rectocele (01/05/2015), Retinitis pigmentosa, and S/P cholecystectomy (01/05/2015). She also  has a past surgical history that includes Cholecystectomy; Rectocele repair; Abdominal hysterectomy; Appendectomy; Colonoscopy; Esophagogastroduodenoscopy; Sphincterotomy (N/A, 01/24/2017); Hemorrhoid surgery (N/A, 03/07/2017); Hernia repair; Esophagogastroduodenoscopy (egd) with propofol (N/A, 09/17/2017); Oophorectomy; and Breast biopsy (Bilateral, 1977). Sandra [name] has a current medication list which includes the following prescription(s): acetaminophen, albuterol, amitriptyline, amlodipine, aspirin, famotidine, fluticasone, hydrochlorothiazide, hydrocodone-acetaminophen, hydroxyzine, levocetirizine, meclizine, melatonin, methocarbamol, montelukast, promethazine, quetiapine, simvastatin, valsartan, vitamin b-12, esomeprazole, gabapentin, [START ON 04/21/2021] hydrocodone-acetaminophen, [START ON 05/21/2021] hydrocodone-acetaminophen, and tramadol. She   reports that she quit smoking about 47 years ago. She has never used smokeless tobacco. She reports that she does not drink alcohol and does not use drugs. Sandra [name] is allergic to trazodone, codeine, cortisone, oxycontin [oxycodone hcl], trazodone and nefazodone, zolpidem, hydrocodone-acetaminophen, and oxycodone-acetaminophen.   HPI  Today, she is being contacted for medication management.  Patient states that [facility] has closed and she  needs her prescriptions resent to [facility]. She is having increased paraspinal and intercostal muscle spasms.  She states that Flexeril has become less effective and she would like to see if there are any other alternatives. She continues gabapentin as prescribed, tramadol, and hydrocodone as prescribed.  Pharmacotherapy Assessment   Opioid Analgesic: Hydrocodone 5 mg every 6 hours as needed, quantity 120/month; MME equals 20. Tramadol 100 mg ER daily for long-acting pain relief .    Monitoring: [place] PMP: PDMP reviewed during this encounter.       Pharmacotherapy: No side-effects or adverse reactions reported. Compliance: No problems identified. Effectiveness: Clinically acceptable. Plan: Refer to "POC". UDS:  Summary  Date Value Ref Range Status  03/22/2021 Note  Final    Comment:    ==================================================================== [employer] Select 13 (MW) ==================================================================== Test                             Result       Flag       Units  Drug Present and Declared for Prescription Verification   Hydrocodone                    2013         EXPECTED   ng/mg creat   Hydromorphone                  814          EXPECTED   ng/mg creat   Dihydrocodeine                 505          EXPECTED   ng/mg creat   Norhydrocodone                 >2041        EXPECTED   ng/mg creat    Sources of hydrocodone include scheduled prescription medications.    Hydromorphone, dihydrocodeine  and norhydrocodone are expected    metabolites of hydrocodone. Hydromorphone and dihydrocodeine are    also available as scheduled prescription medications.    Tramadol                       >2041        EXPECTED   ng/mg creat   O-Desmethyltramadol            >2041        EXPECTED   ng/mg creat   N-Desmethyltramadol            839          EXPECTED   ng/mg creat    Source of tramadol is a prescription medication. O-desmethyltramadol    and N-desmethyltramadol are expected metabolites of tramadol.  ==================================================================== Test                      Result    Flag   Units      Ref Range   Creatinine              245              mg/dL      >=20 ==================================================================== Declared Medications:  The flagging and interpretation on this report are based on the  following declared medications.  Unexpected results may arise from  inaccuracies in the declared medications.   **Note: The testing scope of this panel includes these  medications:   Hydrocodone (Norco)  Tramadol (Ultram)   **Note: The testing scope of this panel does not include the  following reported medications:   Acetaminophen (Tylenol)  Acetaminophen (Norco)  Amitriptyline (Elavil)  Amlodipine (Norvasc)  Aspirin  Cyanocobalamin  Cyclobenzaprine (Flexeril)  Esomeprazole (Nexium)  Famotidine (Pepcid)  Fluticasone (Flonase)  Gabapentin (Neurontin)  Hydrochlorothiazide  Levocetirizine (Xyzal)  Meclizine (Antivert)  Melatonin  Montelukast (Singulair)  Promethazine (Phenergan)  Quetiapine (Seroquel)  Simvastatin (Zocor)  Valsartan (Diovan) ==================================================================== For clinical consultation, please call [phone]. ====================================================================      Laboratory Chemistry Profile   Renal Lab Results  Component Value Date   BUN 11 09/30/2020    CREATININE 0.59 09/30/2020   GFRAA >60 01/16/2017   GFRNONAA >60 09/30/2020    Hepatic Lab Results  Component Value Date   AST 46 (H) 09/30/2020   ALT 61 (H) 09/30/2020   ALBUMIN 3.8 09/30/2020   ALKPHOS 91 09/30/2020   AMMONIA 31 09/28/2020    Electrolytes Lab Results  Component Value Date   NA 140 09/30/2020   K 3.3 (L) 09/30/2020   CL 106 09/30/2020   CALCIUM 9.0 09/30/2020   MG 2.0 09/27/2020    Bone Lab Results  Component Value Date   25OHVITD1 19 (L) 02/07/2015   25OHVITD2 3.9 02/07/2015   25OHVITD3 15 02/07/2015    Inflammation (CRP: Acute Phase) (ESR: Chronic Phase) Lab Results  Component Value Date   CRP 1.3 (H) 09/27/2020   ESRSEDRATE 26 02/07/2015   LATICACIDVEN 1.1 09/27/2020         Note: Above Lab results reviewed.  Imaging  MM 3D SCREEN BREAST BILATERAL CLINICAL DATA:  Screening.  EXAM: DIGITAL SCREENING BILATERAL MAMMOGRAM WITH TOMOSYNTHESIS AND CAD  TECHNIQUE: Bilateral screening digital craniocaudal and mediolateral oblique mammograms were obtained. Bilateral screening digital breast tomosynthesis was performed. The images were evaluated with computer-aided detection.  COMPARISON:  Previous exam(s).  ACR Breast Density Category b: There are scattered areas of fibroglandular density.  FINDINGS: There are no findings suspicious for malignancy.  IMPRESSION: No mammographic evidence of malignancy. A result letter of this screening mammogram will be mailed directly to the patient.  RECOMMENDATION: Screening mammogram in one year. (Code:[id number]-01Y)  BI-RADS CATEGORY  1: Negative.  Electronically Signed   By: Lillia Mountain M.D.   On: 01/08/2021 08:27  Assessment  Diagnoses of Chronic radicular lumbar pain and Chronic pain syndrome were pertinent to this visit.  Plan of Care    Sandra [name] has a current medication list which includes the following long-term medication(s): albuterol, amitriptyline, fluticasone,  hydrochlorothiazide, levocetirizine, montelukast, promethazine, quetiapine, simvastatin, valsartan, esomeprazole, and gabapentin.  Pharmacotherapy (Medications Ordered): Meds ordered this encounter  Medications   gabapentin (NEURONTIN) 400 MG capsule    Sig: Take 1 capsule (400 mg total) by mouth 2 (two) times daily. 400 mg tid    Dispense:  60 capsule    Refill:  5   HYDROcodone-acetaminophen (NORCO/VICODIN) 5-325 MG tablet    Sig: Take 1 tablet by mouth every 6 (six) hours as needed for severe pain. Must last 30 days.    Dispense:  120 tablet    Refill:  0    Chronic Pain: STOP Act (Not applicable) Fill 1 day early if closed on refill date. Avoid benzodiazepines within 8 hours of opioids   HYDROcodone-acetaminophen (NORCO/VICODIN) 5-325 MG tablet    Sig: Take 1 tablet by mouth every 6 (six) hours as needed for severe pain. Must last 30 days.    Dispense:  120 tablet    Refill:  0    Chronic Pain: STOP Act (Not applicable) Fill 1 day early if closed on refill date. Avoid benzodiazepines within 8 hours of opioids   methocarbamol (ROBAXIN) 500 MG tablet    Sig: Take 1 tablet (500 mg total) by mouth every 8 (eight) hours as needed for muscle spasms.    Dispense:  90 tablet    Refill:  2    Do not place this medication, or any other prescription from our practice, on "Automatic Refill". Patient may have prescription filled one day early if pharmacy is closed on scheduled refill date.   traMADol (ULTRAM-ER) 100 MG 24 hr tablet    Sig: Take 1 tablet (100 mg total) by mouth daily.    Dispense:  30 tablet    Refill:  1    Follow-up plan:   Return for [facility] appt.     Status post bilateral intra-articular knee steroid, right elbow injection on 04/05/2019, 03/01/20       Recent Visits Date Type Provider Dept  03/22/21 Office Visit Gillis Santa, MD [facility]  Showing recent visits within past 90 days and meeting all other requirements Today's Visits Date Type Provider Dept   04/17/21 Office Visit Gillis Santa, MD [facility]  Showing today's visits and meeting all other requirements Future Appointments Date Type Provider Dept  06/19/21 Appointment Gillis Santa, MD [facility]  Showing future appointments within next 90 days and meeting all other requirements  I discussed the assessment and treatment plan with the patient. The patient was provided an opportunity to ask questions and all were answered. The patient agreed with the plan and demonstrated an understanding of the instructions.  Patient advised to call back or seek an in-person evaluation if the symptoms or condition worsens.  Duration of encounter: 60mnutes.  Note by: BGillis Santa MD Date: 04/17/2021; Time: 3:06 PM

## 2021-04-20 ENCOUNTER — Telehealth: Payer: Self-pay | Admitting: Student in an Organized Health Care Education/Training Program

## 2021-04-20 NOTE — Telephone Encounter (Signed)
Done

## 2021-04-20 NOTE — Telephone Encounter (Signed)
[facility] needs to verify orders on med script before they can fill. Please call ?

## 2021-05-08 ENCOUNTER — Telehealth: Payer: Self-pay | Admitting: Student in an Organized Health Care Education/Training Program

## 2021-05-08 NOTE — Telephone Encounter (Signed)
Patient informed that there are NO medication changes over the phone and she would need to discuss this with Dr. Holley Raring. Her next appointment is May 2nd. Patient request to come in sooner if available. Message sent to [place]. ?

## 2021-05-08 NOTE — Telephone Encounter (Signed)
Patient's current scripts are at [facility] on [address]. She wanted it [facility] [place] for convenience. I informed her that at her refill appointment we can change her pharmacy but that she needed to finish her current prescriptions a the  [facility]. Patient understood instructions. ?

## 2021-05-10 ENCOUNTER — Telehealth: Payer: Self-pay | Admitting: Student in an Organized Health Care Education/Training Program

## 2021-05-10 ENCOUNTER — Other Ambulatory Visit: Payer: Self-pay | Admitting: *Deleted

## 2021-05-10 DIAGNOSIS — G894 Chronic pain syndrome: Secondary | ICD-10-CM

## 2021-05-10 DIAGNOSIS — G8929 Other chronic pain: Secondary | ICD-10-CM

## 2021-05-10 MED ORDER — GABAPENTIN 400 MG PO CAPS: 400.0000 mg | ORAL_CAPSULE | Freq: Two times a day (BID) | ORAL | 5 refills | Status: DC

## 2021-05-10 NOTE — Telephone Encounter (Signed)
Request sent to Dr. Holley Raring. ?

## 2021-05-11 ENCOUNTER — Telehealth: Payer: Self-pay | Admitting: Student in an Organized Health Care Education/Training Program

## 2021-05-11 ENCOUNTER — Other Ambulatory Visit: Payer: Self-pay | Admitting: *Deleted

## 2021-05-11 DIAGNOSIS — G8929 Other chronic pain: Secondary | ICD-10-CM

## 2021-05-11 DIAGNOSIS — G894 Chronic pain syndrome: Secondary | ICD-10-CM

## 2021-05-11 NOTE — Telephone Encounter (Signed)
Has questions about script that was supposed to be sent to [facility] in [place] for Gabapentin ?

## 2021-05-11 NOTE — Telephone Encounter (Signed)
Attempted to call patient. No answer, no voicemail. Rx message sent to Dr. Holley Raring. ?

## 2021-05-14 MED ORDER — GABAPENTIN 400 MG PO CAPS: 400.0000 mg | ORAL_CAPSULE | Freq: Two times a day (BID) | ORAL | 5 refills | Status: DC

## 2021-05-14 NOTE — Telephone Encounter (Signed)
Attempted to call patient to notify her that prescription has been sent to [facility]. No answer, no voicemail. ?

## 2021-05-15 ENCOUNTER — Telehealth: Payer: Self-pay | Admitting: Student in an Organized Health Care Education/Training Program

## 2021-05-15 ENCOUNTER — Other Ambulatory Visit: Payer: Self-pay | Admitting: *Deleted

## 2021-05-15 ENCOUNTER — Other Ambulatory Visit: Payer: Self-pay | Admitting: Student in an Organized Health Care Education/Training Program

## 2021-05-15 DIAGNOSIS — G8929 Other chronic pain: Secondary | ICD-10-CM

## 2021-05-15 DIAGNOSIS — G894 Chronic pain syndrome: Secondary | ICD-10-CM

## 2021-05-15 MED ORDER — GABAPENTIN 400 MG PO CAPS: 400.0000 mg | ORAL_CAPSULE | Freq: Two times a day (BID) | ORAL | 5 refills | Status: DC

## 2021-05-15 MED ORDER — GABAPENTIN 400 MG PO CAPS: 400.0000 mg | ORAL_CAPSULE | Freq: Three times a day (TID) | ORAL | 5 refills | Status: DC

## 2021-05-15 NOTE — Telephone Encounter (Signed)
Called patient to let her know that I have already talked to [facility] about the sig and it is 400 mg tid qty 24.  Patient verbalizes u/o information.  ?

## 2021-05-15 NOTE — Telephone Encounter (Signed)
[facility] pharmacy called and is asking about patient's gabapentin.  There was an Rx that was sent in for gabapentin 400 mg tid qty 60.  I did tell her that I could see that Rx but it had been discontinued and resent today for Gabapentin 400 mg tid qty 90 refills 5.  Pharmacist states they have not received this yet and took a verbal order for the correct Rx that was resent today @ 1040.  ?

## 2021-06-01 ENCOUNTER — Encounter: Payer: Self-pay | Admitting: Emergency Medicine

## 2021-06-01 ENCOUNTER — Emergency Department
Admission: EM | Admit: 2021-06-01 | Discharge: 2021-06-01 | Disposition: A | Discharge status: Home / Self Care | Payer: Medicare PPO | Attending: Orthopedic Surgery | Admitting: Orthopedic Surgery

## 2021-06-01 ENCOUNTER — Other Ambulatory Visit: Payer: Self-pay

## 2021-06-01 DIAGNOSIS — T63451A Toxic effect of venom of hornets, accidental (unintentional), initial encounter: Secondary | ICD-10-CM | POA: Diagnosis not present

## 2021-06-01 DIAGNOSIS — X58XXXA Exposure to other specified factors, initial encounter: Secondary | ICD-10-CM | POA: Diagnosis not present

## 2021-06-01 DIAGNOSIS — Z7982 Long term (current) use of aspirin: Secondary | ICD-10-CM | POA: Diagnosis not present

## 2021-06-01 DIAGNOSIS — Z79899 Other long term (current) drug therapy: Secondary | ICD-10-CM | POA: Insufficient documentation

## 2021-06-01 DIAGNOSIS — S6991XA Unspecified injury of right wrist, hand and finger(s), initial encounter: Secondary | ICD-10-CM | POA: Diagnosis present

## 2021-06-01 MED ORDER — LIDOCAINE HCL (PF) 1 % IJ SOLN
2.0000 mL | Freq: Once | INTRAMUSCULAR | Status: AC
  Administered 2021-06-01: 2 mL
  Filled 2021-06-01: qty 5

## 2021-06-01 MED ORDER — DEXAMETHASONE SODIUM PHOSPHATE 10 MG/ML IJ SOLN
10.0000 mg | Freq: Once | INTRAMUSCULAR | Status: AC
  Administered 2021-06-01: 10 mg via INTRAMUSCULAR
  Filled 2021-06-01: qty 1

## 2021-06-01 MED ORDER — PREDNISONE 10 MG PO TABS: 10.0000 mg | ORAL_TABLET | Freq: Every day | ORAL | 0 refills | Status: DC

## 2021-06-01 MED ORDER — DIPHENHYDRAMINE HCL 25 MG PO CAPS
25.0000 mg | ORAL_CAPSULE | Freq: Once | ORAL | Status: AC
  Administered 2021-06-01: 25 mg via ORAL
  Filled 2021-06-01: qty 1

## 2021-06-01 MED ORDER — HYDROCODONE-ACETAMINOPHEN 5-325 MG PO TABS
1.0000 | ORAL_TABLET | ORAL | Status: AC
  Administered 2021-06-01: 1 via ORAL
  Filled 2021-06-01: qty 1

## 2021-06-01 MED ORDER — HYDROCODONE-ACETAMINOPHEN 5-325 MG PO TABS: 1.0000 | ORAL_TABLET | Freq: Four times a day (QID) | ORAL | 0 refills | Status: DC | PRN

## 2021-06-01 NOTE — ED Provider Notes (Signed)
?[facility] EMERGENCY DEPARTMENT ?Provider Note ? ? ?CSN: [id number] ?Arrival date & time: 06/01/21  1956 ? ?  ? ? ?Chief Complaint  ?Patient presents with  ? Insect Bite  ? ? ?Sandra [name] is a 66 y.o. female presents to the emergency department for evaluation of right fifth digit sting.  She states her friend told her it was a [place] hornet that stung her on the right fifth digit.  There is mild swelling and tenderness to palpation to the right fifth digit.  No other systemic symptoms.  Patient with no chest pain, shortness of breath, wheezing or facial swelling.  She denies any history of anaphylaxis.  She is having moderate to severe pain along the right fifth digit ? ?HPI ? ?  ? ?Home Medications ?Prior to Admission medications   ?Medication Sig Start Date End Date Taking? Authorizing Provider  ?HYDROcodone-acetaminophen (NORCO) 5-325 MG tablet Take 1 tablet by mouth every 6 (six) hours as needed for moderate pain. 06/01/21  Yes Duanne Guess, PA-C  ?predniSONE (DELTASONE) 10 MG tablet Take 1 tablet (10 mg total) by mouth daily. 6,5,4,3,2,1 six day taper 06/01/21  Yes Duanne Guess, PA-C  ?acetaminophen (TYLENOL) 325 MG tablet Take 650 mg by mouth every 6 (six) hours as needed.    [provider]  ?albuterol (VENTOLIN HFA) 108 (90 Base) MCG/ACT inhaler Inhale 2 puffs into the lungs every 6 (six) hours as needed for wheezing or shortness of breath.    [provider]  ?amitriptyline (ELAVIL) 25 MG tablet Take 25 mg by mouth at bedtime. 05/21/19   [provider]  ?amLODipine (NORVASC) 5 MG tablet Take 5 mg by mouth daily. 05/21/19   [provider]  ?aspirin 81 MG tablet Take 81 mg by mouth daily.    [provider]  ?esomeprazole (NEXIUM) 40 MG capsule Take by mouth. 04/22/19 09/28/20  [provider]  ?famotidine (PEPCID) 40 MG tablet Take 1 tablet by mouth at bedtime. 12/28/20 12/28/21  [provider]  ?fluticasone  (FLONASE) 50 MCG/ACT nasal spray Place 2 sprays into both nostrils daily.    [provider]  ?gabapentin (NEURONTIN) 400 MG capsule Take 1 capsule (400 mg total) by mouth 3 (three) times daily. 05/15/21   Gillis Santa, MD  ?hydrochlorothiazide (HYDRODIURIL) 12.5 MG tablet TAKE ONE TABLET BY MOUTH ONCE DAILY AS NEEDED FOR SWELLING OF FEET 06/08/18   [provider]  ?hydrOXYzine (ATARAX) 25 MG tablet Take 1 tablet by mouth 3 (three) times daily as needed. 04/09/21   [provider]  ?levocetirizine (XYZAL) 5 MG tablet Take 5 mg by mouth every evening. 11/18/18   [provider]  ?meclizine (ANTIVERT) 25 MG tablet Take 1 tablet (25 mg total) by mouth 3 (three) times daily as needed for dizziness. 10/01/18   Cuthriell, Charline Bills, PA-C  ?Melatonin 10 MG TABS Take 2 tablets by mouth as needed (TAKES WITH LUNESTA IF THE LUNESTA DOES NOT HELP).     [provider]  ?methocarbamol (ROBAXIN) 500 MG tablet Take 1 tablet (500 mg total) by mouth every 8 (eight) hours as needed for muscle spasms. 04/17/21   Gillis Santa, MD  ?montelukast (SINGULAIR) 10 MG tablet Take 10 mg by mouth at bedtime. 07/17/18   [provider]  ?promethazine (PHENERGAN) 25 MG tablet Take 25 mg by mouth every 6 (six) hours as needed for nausea. 05/31/19   [provider]  ?QUEtiapine (SEROQUEL) 50 MG tablet Take 50 mg by  mouth at bedtime. 09/22/20   [provider]  ?simvastatin (ZOCOR) 20 MG tablet Take 20 mg by mouth at bedtime.  02/01/15   [provider]  ?traMADol (ULTRAM-ER) 100 MG 24 hr tablet Take 1 tablet (100 mg total) by mouth daily. 04/17/21   Gillis Santa, MD  ?valsartan (DIOVAN) 160 MG tablet Take 160 mg by mouth daily. 09/20/20   [provider]  ?vitamin B-12 (CYANOCOBALAMIN) 500 MCG tablet Take 500 mcg by mouth daily.    [provider]  ?   ? ?Allergies    ?Trazodone, Codeine, Cortisone, Oxycontin [oxycodone hcl], Trazodone and nefazodone,  Zolpidem, Hydrocodone-acetaminophen, and Oxycodone-acetaminophen   ? ?Review of Systems   ?Review of Systems ? ?Physical Exam ?Updated Vital Signs ?BP 128/78 (BP Location: Left Arm)   Pulse (!) 114   Temp 98.4 ?F (36.9 ?C) (Oral)   Resp 20   Ht '4\' 10"'$  (1.473 m)   Wt 64.9 kg   SpO2 96%   BMI 29.89 kg/m?  ?Physical Exam ?Constitutional:   ?   Appearance: She is well-developed.  ?HENT:  ?   Head: Normocephalic and atraumatic.  ?   Ears:  ?   Comments: No facial swelling ?   Mouth/Throat:  ?   Mouth: Mucous membranes are moist.  ?   Pharynx: No posterior oropharyngeal erythema.  ?Eyes:  ?   Extraocular Movements: Extraocular movements intact.  ?   Conjunctiva/sclera: Conjunctivae normal.  ?   Pupils: Pupils are equal, round, and reactive to light.  ?Cardiovascular:  ?   Rate and Rhythm: Normal rate.  ?Pulmonary:  ?   Effort: Pulmonary effort is normal. No respiratory distress.  ?   Breath sounds: No wheezing or rales.  ?Musculoskeletal:     ?   General: Normal range of motion.  ?   Cervical back: Normal range of motion.  ?   Comments: Right fifth digit swollen only along the distal phalanx, slightly erythematous.  No bite or sting marks seen.  No open wounds  ?Skin: ?   General: Skin is warm.  ?   Findings: No rash.  ?Neurological:  ?   Mental Status: She is alert and oriented to person, place, and time.  ?Psychiatric:     ?   Behavior: Behavior normal.     ?   Thought Content: Thought content normal.  ? ? ?ED Results / Procedures / Treatments   ?Labs ?(all labs ordered are listed, but only abnormal results are displayed) ?Labs Reviewed - No data to display ? ?EKG ?None ? ?Radiology ?No results found. ? ?Procedures ?.Nerve Block ? ?Date/Time: 06/01/2021 10:24 PM ?Performed by: Duanne Guess, PA-C ?Authorized by: Duanne Guess, PA-C  ? ?Consent:  ?  Consent obtained:  Verbal ?  Consent given by:  Patient ?Indications:  ?  Indications:  Pain relief ?Location:  ?  Body area:  Upper extremity (Right fifth digit  digital block) ?  Laterality:  Right ?Pre-procedure details:  ?  Skin preparation:  Alcohol ?Procedure details:  ?  Block needle gauge:  25 G ?  Anesthetic injected:  Lidocaine 1% w/o epi ?  Paresthesia:  Immediately resolved ?Post-procedure details:  ?  Dressing:  None  ? ? ?Medications Ordered in ED ?Medications  ?dexamethasone (DECADRON) injection 10 mg (has no administration in time range)  ?HYDROcodone-acetaminophen (NORCO/VICODIN) 5-325 MG per tablet 1 tablet (has no administration in time range)  ?diphenhydrAMINE (BENADRYL) capsule 25 mg (has no administration in time range)  ? ? ?  ED Course/ Medical Decision Making/ A&P ?  ?                        ?Medical Decision Making ?Risk ?Prescription drug management. ? ?66 year old female with hornet sting to the right fifth digit.  She has pain and swelling just to the right fifth digit with no other signs of systemic inflammatory response.  No difficulty swallowing, difficulty breathing.  Only having pain in the right fifth digit that is severe.  She was given dexamethasone and Norco and saw slight improvement, she was given ice and continued to have some pain.  She was requesting complete relief so I offered her a digital block which she agreed to and responded well to with complete resolution of pain and discomfort. ? ?Final Clinical Impression(s) / ED Diagnoses ?Final diagnoses:  ?Hornet sting, accidental or unintentional, initial encounter  ? ? ?Rx / DC Orders ?ED Discharge Orders   ? ?      Ordered  ?  predniSONE (DELTASONE) 10 MG tablet  Daily       ? 06/01/21 2125  ?  HYDROcodone-acetaminophen (NORCO) 5-325 MG tablet  Every 6 hours PRN       ? 06/01/21 2125  ? ?  ?  ? ?  ? ? ?  ?Duanne Guess, PA-C ?06/01/21 2225 ? ?  ?Blake Divine, MD ?06/05/21 1611 ? ?

## 2021-06-01 NOTE — ED Triage Notes (Signed)
Pt reports she was dusting her chair and felt something bit her in her 5th digit. Pt reports she developed pain immediately. Pt reports her friend told her it was a hornet that stung her. Pt talks in complete sentences no distress. Pt is vision impaired.  ?

## 2021-06-01 NOTE — Discharge Instructions (Addendum)
Please continue with ice to the right little finger.  Take prednisone as prescribed daily for 6 days and use Norco as needed for breakthrough pain.  Return to the ER for any difficulty breathing, facial swelling, worsening symptoms or any urgent changes in your health ?

## 2021-06-04 ENCOUNTER — Telehealth: Payer: Self-pay | Admitting: Student in an Organized Health Care Education/Training Program

## 2021-06-04 NOTE — Telephone Encounter (Signed)
Spoke with Dr Holley Raring and he states that if she feels like she needs the Hydrocodone for the bee sting she can get it filled.  I spoke with the patient and she states she does not think she needs to get it filled at this time.  ?

## 2021-06-04 NOTE — Telephone Encounter (Signed)
Patient had to go to ED due to a bee sting, she had allergic reaction. They gave her cortozone and also sent in script for hydrocodone. She has not fill the hydrocodone. Should she get it filled? Please call patient.  ?

## 2021-06-12 ENCOUNTER — Telehealth: Payer: Self-pay | Admitting: Student in an Organized Health Care Education/Training Program

## 2021-06-12 ENCOUNTER — Ambulatory Visit
Payer: Medicare PPO | Attending: Student in an Organized Health Care Education/Training Program | Admitting: Student in an Organized Health Care Education/Training Program

## 2021-06-12 ENCOUNTER — Encounter: Payer: Self-pay | Admitting: Student in an Organized Health Care Education/Training Program

## 2021-06-12 VITALS — BP 156/63 | HR 84 | Temp 97.2°F | Ht <= 58 in | Wt 143.0 lb

## 2021-06-12 DIAGNOSIS — H5316 Psychophysical visual disturbances: Secondary | ICD-10-CM | POA: Insufficient documentation

## 2021-06-12 DIAGNOSIS — M79605 Pain in left leg: Secondary | ICD-10-CM | POA: Diagnosis present

## 2021-06-12 DIAGNOSIS — M79604 Pain in right leg: Secondary | ICD-10-CM | POA: Diagnosis present

## 2021-06-12 DIAGNOSIS — M170 Bilateral primary osteoarthritis of knee: Secondary | ICD-10-CM | POA: Insufficient documentation

## 2021-06-12 DIAGNOSIS — M5416 Radiculopathy, lumbar region: Secondary | ICD-10-CM | POA: Insufficient documentation

## 2021-06-12 DIAGNOSIS — M47816 Spondylosis without myelopathy or radiculopathy, lumbar region: Secondary | ICD-10-CM | POA: Insufficient documentation

## 2021-06-12 DIAGNOSIS — H539 Unspecified visual disturbance: Secondary | ICD-10-CM | POA: Diagnosis not present

## 2021-06-12 DIAGNOSIS — G8929 Other chronic pain: Secondary | ICD-10-CM | POA: Insufficient documentation

## 2021-06-12 DIAGNOSIS — G894 Chronic pain syndrome: Secondary | ICD-10-CM | POA: Diagnosis present

## 2021-06-12 MED ORDER — HYDROCODONE-ACETAMINOPHEN 5-325 MG PO TABS: 1.0000 | ORAL_TABLET | Freq: Four times a day (QID) | ORAL | 0 refills | Status: AC | PRN

## 2021-06-12 MED ORDER — HYDROCODONE-ACETAMINOPHEN 5-325 MG PO TABS: 1.0000 | ORAL_TABLET | Freq: Four times a day (QID) | ORAL | 0 refills | Status: DC | PRN

## 2021-06-12 MED ORDER — MAGNESIUM 500 MG PO CAPS: 500.0000 mg | ORAL_CAPSULE | Freq: Every day | ORAL | 0 refills | Status: AC

## 2021-06-12 MED ORDER — TRAMADOL HCL ER 100 MG PO TB24: 100.0000 mg | ORAL_TABLET | Freq: Every day | ORAL | 2 refills | Status: DC

## 2021-06-12 NOTE — Progress Notes (Signed)
PROVIDER NOTE: Information contained herein reflects review and annotations entered in association with encounter. Interpretation of such information and data should be left to medically-trained personnel. Information provided to patient can be located elsewhere in the medical record under "Patient Instructions". Document created using STT-dictation technology, any transcriptional errors that may result from process are unintentional.  ?  ?Patient: Sandra [name]  Service Category: E/M  Provider: Gillis Santa, MD  ?DOB: 10/19/1955  DOS: 06/12/2021  Specialty: Interventional Pain Management  ?MRN: [id number]  Setting: Ambulatory outpatient  PCP: Baxter Hire, MD  ?Type: Established Patient    Referring Provider: Baxter Hire, MD  ?Location: Office  Delivery: Face-to-face    ? ?HPI  ?Sandra [name], a 66 y.o. year old female, is here today because of her Bilateral primary osteoarthritis of knee [M17.0]. Sandra [name]'s primary complain today is Leg Pain (both) ?Last encounter: My last encounter with her was on 04/17/21 ?Pertinent problems: Sandra [name] has Long term current use of opiate analgesic; Long term prescription opiate use; Opiate use; Hallucinations, visual; Psychiatric disorder; Chronic low back pain (Location of Primary Source of Pain) (Bilateral) (R>L); Chronic bilateral knee pain; Opiate dependence ([place]); Lumbar facet arthropathy; Lumbar spondylosis; Lumbar facet syndrome (Bilateral) (R>L); Diffuse myofascial pain syndrome; [name] (CBS); Arthropathy of right elbow; and Localized osteoarthritis of right shoulder on their pertinent problem list. ?Pain Assessment: Severity of Chronic pain is reported as a 5 /10. Location: Leg Right, Left/pain radiaties down both leg to her feet. Onset: More than a month ago. Quality: Aching, Burning, Constant, Throbbing, Sharp. Timing: Constant. Modifying factor(s): meds. ?Vitals:  height is 4' (1.219 m) and weight is 143 lb (64.9 kg). Her  temperature is 97.2 ?F (36.2 ?C) (abnormal). Her blood pressure is 156/63 (abnormal) and her pulse is 84. Her oxygen saturation is 100%.  ? ?Reason for encounter: medication management.  ? ?Patient presents today for medication management.  No significant change in her medical history since her last visit with me.  Continues tramadol ER and hydrocodone 5 mg every 6 hours for breakthrough pain as prescribed and provides analgesic benefit.  Overall pain is at baseline.  She is having increased muscle spasms and tension in her legs that is refractory to Robaxin.  We discussed the addition of magnesium to help out with her symptoms.  Is complaining of bilateral knee pain related to knee osteoarthritis.  Previous knee steroid injection was done over 1 year ago on March 01, 2020.  Patient would like to repeat. ? ?Pharmacotherapy Assessment  ? ?05/23/2021  04/17/2021   1  Hydrocodone-Acetamin 5-325 Mg 120.00  30  [facility] Lat  [id number]   [facility] ([id number])  0/0  20.00 MME  Medicare  [place]   ? 05/21/2021  04/17/2021   1  Tramadol Hcl Er 100 Mg Tablet 30.00  30  Bi Lat  [id number]   [facility] ([id number])  1/1  10.00 MME  Medicare  [place]   ? ? ?Analgesic: ?Tramadol 100 mg ER daily as baseline pain adjunct ?Hydrocodone 5 mg every 6 hours as needed for breakthrough pain ? ?Monitoring: ?[place] PMP: PDMP reviewed during this encounter.       ?Pharmacotherapy: No side-effects or adverse reactions reported. ?Compliance: No problems identified. ?Effectiveness: Clinically acceptable. ? ?Chauncey Fischer, RN  06/12/2021  1:19 PM  Sign when Signing Visit ?Nursing Pain Medication Assessment:  ?Safety precautions to be maintained throughout the outpatient stay will include: orient to surroundings, keep bed in low position, maintain call bell  within reach at all times, provide assistance with transfer out of bed and ambulation.  ?Medication Inspection Compliance: Pill count conducted under aseptic conditions, in front of the patient. Neither the pills nor the bottle was removed  from the patient's sight at any time. Once count was completed pills were immediately returned to the patient in their original bottle. ? ?Medication: Tramadol (Ultram) ER ?Pill/Patch Count:  8 of 30 pills remain ?Pill/Patch Appearance: Markings consistent with prescribed medication ?Bottle Appearance: Standard pharmacy container. Clearly labeled. ?Filled Date: 4 / 3 / 2023 ?Last Medication intake:  TodayNursing Pain Medication Assessment:  ? ? ? ?Medication: Hydrocodone/APAP ?Pill/Patch Count:  36 of 120 pills remain ?Pill/Patch Appearance: Markings consistent with prescribed medication ?Bottle Appearance: Standard pharmacy container. Clearly labeled. ?Filled Date: 4 / 3 / 2023 ?Last Medication intake:  TodaySafety precautions to be maintained throughout the outpatient stay will include: orient to surroundings, keep bed in low position, maintain call bell within reach at all times, provide assistance with transfer out of bed and ambulation.  ?  ?  UDS:  ?Summary  ?Date Value Ref Range Status  ?03/22/2021 Note  Final  ?  Comment:  ?  ==================================================================== ?[employer] Select 13 (MW) ?==================================================================== ?Test                             Result       Flag       Units ? ?Drug Present and Declared for Prescription Verification ?  Hydrocodone                    2013         EXPECTED   ng/mg creat ?  Hydromorphone                  814          EXPECTED   ng/mg creat ?  Dihydrocodeine                 505          EXPECTED   ng/mg creat ?  Norhydrocodone                 >2041        EXPECTED   ng/mg creat ?   Sources of hydrocodone include scheduled prescription medications. ?   Hydromorphone, dihydrocodeine and norhydrocodone are expected ?   metabolites of hydrocodone. Hydromorphone and dihydrocodeine are ?   also available as scheduled prescription medications. ? ?  Tramadol                       >2041        EXPECTED   ng/mg  creat ?  O-Desmethyltramadol            >2041        EXPECTED   ng/mg creat ?  N-Desmethyltramadol            839          EXPECTED   ng/mg creat ?   Source of tramadol is a prescription medication. O-desmethyltramadol ?   and N-desmethyltramadol are expected metabolites of tramadol. ? ?==================================================================== ?Test                      Result    Flag   Units      Ref Range ?  Creatinine  245              mg/dL      >=20 ?==================================================================== ?Declared Medications: ? The flagging and interpretation on this report are based on the ? following declared medications.  Unexpected results may arise from ? inaccuracies in the declared medications. ? ? **Note: The testing scope of this panel includes these medications: ? ? Hydrocodone (Norco) ? Tramadol (Ultram) ? ? **Note: The testing scope of this panel does not include the ? following reported medications: ? ? Acetaminophen (Tylenol) ? Acetaminophen (Norco) ? Amitriptyline (Elavil) ? Amlodipine (Norvasc) ? Aspirin ? Cyanocobalamin ? Cyclobenzaprine (Flexeril) ? Esomeprazole (Nexium) ? Famotidine (Pepcid) ? Fluticasone (Flonase) ? Gabapentin (Neurontin) ? Hydrochlorothiazide ? Levocetirizine (Xyzal) ? Meclizine (Antivert) ? Melatonin ? Montelukast (Singulair) ? Promethazine (Phenergan) ? Quetiapine (Seroquel) ? Simvastatin (Zocor) ? Valsartan (Diovan) ?==================================================================== ?For clinical consultation, please call [phone]. ?==================================================================== ?  ?  ? ?ROS  ?Constitutional: Denies any fever or chills ?Gastrointestinal: No reported hemesis, hematochezia, vomiting, or acute GI distress ?Musculoskeletal:  b/l knee pain ?Neurological: No reported episodes of acute onset apraxia, aphasia, dysarthria, agnosia, amnesia, paralysis, loss of coordination, or loss of  consciousness ? ?Medication Review  ?HYDROcodone-acetaminophen, Magnesium, Melatonin, QUEtiapine, acetaminophen, albuterol, amLODipine, amitriptyline, aspirin, doxylamine (Sleep), esomeprazole, famotidine, fluticasone, ga

## 2021-06-12 NOTE — Telephone Encounter (Signed)
Patient called in and states that her medication dates are incorrect.  She is to fill on 06/22/21 for hydrocodone - apap 5-325 mg and Tramadol 06/20/21.  Her last fill was 05/21/21 and she is very concerned about running out of her medicines,  States she does not want to go through withdrawals.  I did ask patient patient if she could make them last, she stated no, I take them the way I am supposed to and I will not have enough.   I did explain that the medication is ordered every 6 hours as needed for moderate pain.  So, she is not supposed to take them every 6 hours unless she needs them.  Not sure she completely understood my rationale.  ?

## 2021-06-12 NOTE — Progress Notes (Signed)
Nursing Pain Medication Assessment:  ?Safety precautions to be maintained throughout the outpatient stay will include: orient to surroundings, keep bed in low position, maintain call bell within reach at all times, provide assistance with transfer out of bed and ambulation.  ?Medication Inspection Compliance: Pill count conducted under aseptic conditions, in front of the patient. Neither the pills nor the bottle was removed from the patient's sight at any time. Once count was completed pills were immediately returned to the patient in their original bottle. ? ?Medication: Tramadol (Ultram) ER ?Pill/Patch Count:  8 of 30 pills remain ?Pill/Patch Appearance: Markings consistent with prescribed medication ?Bottle Appearance: Standard pharmacy container. Clearly labeled. ?Filled Date: 4 / 3 / 2023 ?Last Medication intake:  TodayNursing Pain Medication Assessment:  ? ? ? ?Medication: Hydrocodone/APAP ?Pill/Patch Count:  36 of 120 pills remain ?Pill/Patch Appearance: Markings consistent with prescribed medication ?Bottle Appearance: Standard pharmacy container. Clearly labeled. ?Filled Date: 4 / 3 / 2023 ?Last Medication intake:  TodaySafety precautions to be maintained throughout the outpatient stay will include: orient to surroundings, keep bed in low position, maintain call bell within reach at all times, provide assistance with transfer out of bed and ambulation.  ?

## 2021-06-19 ENCOUNTER — Other Ambulatory Visit: Payer: Self-pay | Admitting: Student in an Organized Health Care Education/Training Program

## 2021-06-19 ENCOUNTER — Telehealth: Payer: Self-pay | Admitting: Student in an Organized Health Care Education/Training Program

## 2021-06-19 ENCOUNTER — Encounter: Payer: Medicare PPO | Admitting: Student in an Organized Health Care Education/Training Program

## 2021-06-19 NOTE — Telephone Encounter (Signed)
[facility] and [facility] do not [facility] stock for hydrocodone - apap 5-325 mg.  Patient had left message earlier that her Rx;s were supposed to go to [facility].  I will call and see if they have qty to fill.   ? ?I have spoken with DR Holley Raring and he will resend.   ?

## 2021-06-19 NOTE — Telephone Encounter (Signed)
All hydro - apap 5 - 325 [facility] at [facility].  BL will resend to [facility] in [place].  Patient aware.   ?

## 2021-06-19 NOTE — Telephone Encounter (Signed)
Patient called stating that her scripts were supposed to be sent to [facility]. Please call patient  ?

## 2021-06-20 ENCOUNTER — Ambulatory Visit
Payer: Medicare PPO | Attending: Student in an Organized Health Care Education/Training Program | Admitting: Student in an Organized Health Care Education/Training Program

## 2021-06-20 ENCOUNTER — Encounter: Payer: Self-pay | Admitting: Student in an Organized Health Care Education/Training Program

## 2021-06-20 VITALS — BP 153/86 | HR 87 | Temp 97.3°F | Resp 14 | Ht <= 58 in | Wt 143.0 lb

## 2021-06-20 DIAGNOSIS — G894 Chronic pain syndrome: Secondary | ICD-10-CM | POA: Diagnosis not present

## 2021-06-20 DIAGNOSIS — M170 Bilateral primary osteoarthritis of knee: Secondary | ICD-10-CM | POA: Diagnosis not present

## 2021-06-20 MED ORDER — LIDOCAINE HCL 2 % IJ SOLN
10.0000 mL | Freq: Once | INTRAMUSCULAR | Status: AC
  Administered 2021-06-20: 100 mg
  Filled 2021-06-20: qty 20

## 2021-06-20 MED ORDER — METHYLPREDNISOLONE ACETATE 80 MG/ML IJ SUSP
80.0000 mg | Freq: Once | INTRAMUSCULAR | Status: AC
  Administered 2021-06-20: 80 mg via INTRA_ARTICULAR

## 2021-06-20 MED ORDER — ROPIVACAINE HCL 2 MG/ML IJ SOLN
INTRAMUSCULAR | Status: AC
Start: 2021-06-20 — End: ?
  Filled 2021-06-20: qty 20

## 2021-06-20 MED ORDER — METHYLPREDNISOLONE ACETATE 80 MG/ML IJ SUSP
INTRAMUSCULAR | Status: AC
Start: 2021-06-20 — End: ?
  Filled 2021-06-20: qty 1

## 2021-06-20 MED ORDER — ROPIVACAINE HCL 2 MG/ML IJ SOLN
9.0000 mL | Freq: Once | INTRAMUSCULAR | Status: AC
  Administered 2021-06-20: 20 mL via INTRA_ARTICULAR

## 2021-06-20 NOTE — Patient Instructions (Signed)

## 2021-06-20 NOTE — Progress Notes (Signed)
PROVIDER NOTE: Interpretation of information contained herein should be left to medically-trained personnel. Specific patient instructions are provided elsewhere under "Patient Instructions" section of medical record. This document was created in part using STT-dictation technology, any transcriptional errors that may result from this process are unintentional.  ?Patient: Sandra [name] ?Type: Established ?DOB: 1955-12-15 ?MRN: [id number] ?PCP: Baxter Hire, MD  Service: Procedure ?DOS: 06/20/2021 ?Setting: Ambulatory ?Location: Ambulatory outpatient facility ?Delivery: Face-to-face Provider: Gillis Santa, MD ?Specialty: Interventional Pain Management ?Specialty designation: 09 ?Location: Outpatient facility ?Ref. Prov.: Baxter Hire, MD   ? ?Primary Reason for Visit: Interventional Pain Management Treatment. ?CC: Knee Pain (bilateral) ?  ?Procedure:          ? Type: Steroid Intra-articular Knee Injection ?Laterality: Bilateral (-50) ?Level/approach: Medial ?Imaging guidance: None required ([id number]) ?Anesthesia: Local anesthesia (1-2% Lidocaine) ?Anxiolysis: None                 ?Sedation: None. ?DOS: 06/20/2021  ?Performed by: Gillis Santa, MD ? ?Purpose: Diagnostic/Therapeutic ?Indications: Knee arthralgia associated to osteoarthritis of the knee ?1. Bilateral primary osteoarthritis of knee   ?2. Chronic pain [name]   ? ?NAS-11 score:  ? Pre-procedure: 4 /10  ? Post-procedure: 4 /10  ?  ? ?Pre-Procedure Preparation  ?Monitoring: As per clinic protocol.  ?Risk Assessment: ?Vitals:  [id number] body mass index is 29.89 kg/m? as calculated from the following: ?  Height as of this encounter: '4\' 10"'$  (1.473 m). ?  Weight as of this encounter: 143 lb (64.9 kg)., Rate:87 , BP:(!) 153/86, Resp:14, Temp:(!) 97.3 ?F (36.3 ?C), SpO2:94 %  ?Allergies: She is allergic to trazodone, codeine, cortisone, oxycontin [oxycodone hcl], zolpidem, hydrocodone-acetaminophen, and oxycodone-acetaminophen.  ?Precautions: No  additional precautions required  ?Blood-thinner(s): None at this time  ?Coagulopathies: Reviewed. None identified.   ?Active Infection(s): Reviewed. None identified. Sandra [name] is afebrile  ? ?Location setting: Exam room ?Position: Sitting w/ knee bent 90 degrees ?Safety Precautions: Patient was assessed for positional comfort and pressure points before starting the procedure. ?Prepping solution: DuraPrep (Iodine Povacrylex [0.7% available iodine] and Isopropyl Alcohol, 74% w/w) ?Prep Area: Entire knee region ?Approach: percutaneous, just above the tibial plateau, medial to the infrapatellar tendon. ?Intended target: Intra-articular knee space ?Materials: ?Tray: Block ?Needle(s): Regular ?Qty: 1/side ?Length: 1.5-inch ?Gauge: 25G ? ?9 cc solution made of 8 cc of 0.2% Vivacaine, 1 cc of methylprednisolone, 80 mg/cc.  4.5 cc injected into each knee.  ? ?Meds ordered this encounter  ?Medications  ? methylPREDNISolone acetate (DEPO-MEDROL) injection 80 mg  ? ropivacaine (PF) 2 mg/mL (0.2%) (NAROPIN) injection 9 mL  ? lidocaine (XYLOCAINE) 2 % (with pres) injection 200 mg  ?  No orders of the defined types were placed in this encounter. ?  ? ?Time-out: 1319 I initiated and conducted the "Time-out" before starting the procedure, as per protocol. The patient was asked to participate by confirming the accuracy of the "Time Out" information. Verification of the correct person, site, and procedure were performed and confirmed by me, the nursing staff, and the patient. "Time-out" conducted as per Joint Commission's Universal Protocol (UP.01.01.01). ?Procedure checklist: Completed  ? ?H&P (Pre-op  Assessment)  ?Sandra [name] is a 66 y.o. (year old), female patient, seen today for interventional treatment. She  has a past surgical history that includes Cholecystectomy; Rectocele repair; Abdominal hysterectomy; Appendectomy; Colonoscopy; Esophagogastroduodenoscopy; Sphincterotomy (N/A, 01/24/2017); Hemorrhoid surgery (N/A,  03/07/2017); Hernia repair; Esophagogastroduodenoscopy (egd) with propofol (N/A, 09/17/2017); Oophorectomy; and Breast biopsy (Bilateral, 1977). Sandra [name] has a current medication list  which includes the following prescription(s): acetaminophen, albuterol, amitriptyline, amlodipine, aspirin, sleep aid, famotidine, fluticasone, gabapentin, hydrochlorothiazide, [START ON 06/22/2021] hydrocodone-acetaminophen, [START ON 08/21/2021] hydrocodone-acetaminophen, [START ON 07/22/2021] hydrocodone-acetaminophen, hydroxyzine, levocetirizine, magnesium, meclizine, melatonin, methocarbamol, montelukast, promethazine, quetiapine, simvastatin, tramadol, valsartan, vitamin b-12, esomeprazole, and prednisone. Her primarily concern today is the Knee Pain (bilateral) ? She is allergic to trazodone, codeine, cortisone, oxycontin [oxycodone hcl], zolpidem, hydrocodone-acetaminophen, and oxycodone-acetaminophen.  ? ?Last encounter: My last encounter with her was on 06/19/2021. ?Pertinent problems: Sandra [name] has Long term current use of opiate analgesic; Long term prescription opiate use; Opiate use; Hallucinations, visual; Psychiatric disorder; Chronic low back pain (Location of Primary Source of Pain) (Bilateral) (R>L); Chronic bilateral knee pain; Opiate dependence ([place]); Lumbar facet arthropathy; Lumbar spondylosis; Lumbar facet [name] (Bilateral) (R>L); Diffuse myofascial pain [name]; Sandra [name] (CBS); Arthropathy of right elbow; and Localized osteoarthritis of right shoulder on their pertinent problem list. ?Pain Assessment: Severity of Chronic pain is reported as a 4 /10. Location: Knee Right, Left/denies. Onset: More than a month ago. Quality: Dull. Timing: Constant. Modifying factor(s): medications. ?Vitals:  height is '4\' 10"'$  (1.473 m) and weight is 143 lb (64.9 kg). Her temporal temperature is 97.3 ?F (36.3 ?C) (abnormal). Her blood pressure is 153/86 (abnormal) and her pulse is 87. Her respiration is 14 and oxygen  saturation is 94%.  ? ?Reason for encounter: "interventional pain management therapy due pain of at least four (4) weeks in duration, with failure to respond and/or inability to tolerate more conservative care.  ? ?Site Confirmation: Ms. Colburn was asked to confirm the procedure and laterality before marking the site. ? ?Consent: Before the procedure and under the influence of no sedative(s), amnesic(s), or anxiolytics, the patient was informed of the treatment options, risks and possible complications. To fulfill our ethical and legal obligations, as recommended by the American Medical Association's Code of Ethics, I have informed the patient of my clinical impression; the nature and purpose of the treatment or procedure; the risks, benefits, and possible complications of the intervention; the alternatives, including doing nothing; the risk(s) and benefit(s) of the alternative treatment(s) or procedure(s); and the risk(s) and benefit(s) of doing nothing. ?The patient was provided information about the general risks and possible complications associated with the procedure. These may include, but are not limited to: failure to achieve desired goals, infection, bleeding, organ or nerve damage, allergic reactions, paralysis, and death. ?In addition, the patient was informed of those risks and complications associated to Spine-related procedures, such as failure to decrease pain; infection (i.e.: Meningitis, epidural or intraspinal abscess); bleeding (i.e.: epidural hematoma, subarachnoid hemorrhage, or any other type of intraspinal or peri-dural bleeding); organ or nerve damage (i.e.: Any type of peripheral nerve, nerve root, or spinal cord injury) with subsequent damage to sensory, motor, and/or autonomic systems, resulting in permanent pain, numbness, and/or weakness of one or several areas of the body; allergic reactions; (i.e.: anaphylactic reaction); and/or death. ?Furthermore, the patient was informed of those  risks and complications associated with the medications. These include, but are not limited to: allergic reactions (i.e.: anaphylactic or anaphylactoid reaction(s)); adrenal axis suppression; blood sugar elevati

## 2021-06-21 ENCOUNTER — Telehealth: Payer: Self-pay | Admitting: *Deleted

## 2021-06-21 ENCOUNTER — Telehealth: Payer: Self-pay | Admitting: Student in an Organized Health Care Education/Training Program

## 2021-06-21 NOTE — Telephone Encounter (Signed)
Called [facility].There is a script for Hydrocodone from Dr. Arvella Nigh for 15 tabs. That Rx cancelled, instructed pharmacist to fill the Rx from Dr. Holley Raring. Patient notified. ?

## 2021-06-21 NOTE — Telephone Encounter (Signed)
No problem post procedure. 

## 2021-06-22 ENCOUNTER — Telehealth: Payer: Self-pay | Admitting: Student in an Organized Health Care Education/Training Program

## 2021-06-22 ENCOUNTER — Other Ambulatory Visit: Payer: Self-pay

## 2021-06-22 NOTE — Telephone Encounter (Signed)
Patient called stated that the pharmacy will only give her an refill of 15. Please give patient a call thanks. ?

## 2021-06-22 NOTE — Telephone Encounter (Signed)
Dena took care of this yesterday.  Tried to call patient to verify but there was no answer.  ?

## 2021-06-25 ENCOUNTER — Telehealth: Payer: Self-pay | Admitting: Student in an Organized Health Care Education/Training Program

## 2021-06-25 MED ORDER — HYDROCODONE-ACETAMINOPHEN 5-325 MG PO TABS: 1.0000 | ORAL_TABLET | Freq: Four times a day (QID) | ORAL | 0 refills | Status: AC | PRN

## 2021-06-25 NOTE — Telephone Encounter (Signed)
Patient notified that script has been sent to [facility]. ?

## 2021-06-25 NOTE — Telephone Encounter (Signed)
Patient states she needs scripts sent to [facility], [facility]. Please call patient.  ?

## 2021-06-26 ENCOUNTER — Encounter: Payer: Self-pay | Admitting: Oncology

## 2021-06-26 ENCOUNTER — Inpatient Hospital Stay: Payer: Medicare PPO

## 2021-06-26 ENCOUNTER — Inpatient Hospital Stay: Payer: Medicare PPO | Attending: Oncology | Admitting: Oncology

## 2021-06-26 VITALS — BP 136/81 | HR 92 | Temp 96.9°F | Resp 18 | Wt 142.9 lb

## 2021-06-26 DIAGNOSIS — Z803 Family history of malignant neoplasm of breast: Secondary | ICD-10-CM | POA: Insufficient documentation

## 2021-06-26 DIAGNOSIS — D72820 Lymphocytosis (symptomatic): Secondary | ICD-10-CM | POA: Insufficient documentation

## 2021-06-26 DIAGNOSIS — Z809 Family history of malignant neoplasm, unspecified: Secondary | ICD-10-CM | POA: Diagnosis not present

## 2021-06-26 DIAGNOSIS — R5383 Other fatigue: Secondary | ICD-10-CM | POA: Diagnosis not present

## 2021-06-26 DIAGNOSIS — Z87891 Personal history of nicotine dependence: Secondary | ICD-10-CM | POA: Diagnosis not present

## 2021-06-26 LAB — TSH: TSH: 1.684 u[IU]/mL (ref 0.350–4.500)

## 2021-06-26 LAB — CBC WITH DIFFERENTIAL/PLATELET
Abs Immature Granulocytes: 0.06 10*3/uL (ref 0.00–0.07)
Basophils Absolute: 0 10*3/uL (ref 0.0–0.1)
Basophils Relative: 0 %
Eosinophils Absolute: 0.1 10*3/uL (ref 0.0–0.5)
Eosinophils Relative: 1 %
HCT: 41.6 % (ref 36.0–46.0)
Hemoglobin: 13.3 g/dL (ref 12.0–15.0)
Immature Granulocytes: 1 %
Lymphocytes Relative: 30 %
Lymphs Abs: 2.7 10*3/uL (ref 0.7–4.0)
MCH: 29.1 pg (ref 26.0–34.0)
MCHC: 32 g/dL (ref 30.0–36.0)
MCV: 91 fL (ref 80.0–100.0)
Monocytes Absolute: 0.8 10*3/uL (ref 0.1–1.0)
Monocytes Relative: 9 %
Neutro Abs: 5.3 10*3/uL (ref 1.7–7.7)
Neutrophils Relative %: 59 %
Platelets: 400 10*3/uL (ref 150–400)
RBC: 4.57 MIL/uL (ref 3.87–5.11)
RDW: 13.8 % (ref 11.5–15.5)
WBC: 8.9 10*3/uL (ref 4.0–10.5)
nRBC: 0 % (ref 0.0–0.2)

## 2021-06-26 LAB — COMPREHENSIVE METABOLIC PANEL
ALT: 36 U/L (ref 0–44)
AST: 32 U/L (ref 15–41)
Albumin: 4.1 g/dL (ref 3.5–5.0)
Alkaline Phosphatase: 71 U/L (ref 38–126)
Anion gap: 9 (ref 5–15)
BUN: 20 mg/dL (ref 8–23)
CO2: 28 mmol/L (ref 22–32)
Calcium: 9.5 mg/dL (ref 8.9–10.3)
Chloride: 98 mmol/L (ref 98–111)
Creatinine, Ser: 0.77 mg/dL (ref 0.44–1.00)
GFR, Estimated: >60 mL/min (ref >60)
Glucose, Bld: 125 mg/dL — ABNORMAL HIGH (ref 70–99)
Potassium: 3.7 mmol/L (ref 3.5–5.1)
Sodium: 135 mmol/L (ref 135–145)
Total Bilirubin: 0.6 mg/dL (ref 0.3–1.2)
Total Protein: 7.4 g/dL (ref 6.5–8.1)

## 2021-06-26 LAB — LACTATE DEHYDROGENASE: LDH: 75 U/L — ABNORMAL LOW (ref 98–192)

## 2021-06-26 NOTE — Progress Notes (Signed)
?Hematology/Oncology Consult note ?[phone] [phone] ?  ? ?   ? ? ?Patient Care Team: ?Baxter Hire, MD as PCP - General (Internal Medicine) ? ?REFERRING PROVIDER: ?Baxter Hire, MD  ?CHIEF COMPLAINTS/REASON FOR VISIT:  ?Evaluation of lymphocytosis.  ? ?HISTORY OF PRESENTING ILLNESS:  ? ?Sandra [name] is a  66 y.o.  female with PMH listed below was seen in consultation at the request of  Baxter Hire, MD  for evaluation of lymphocytosis . ? ?Patient is legally blind.  ?06/08/2021 cbc showed wbc 11.7. normal hemoglobin, platelet counts.  ?ALC is increased at 6.09. decreased eosinophil.  ?07/11/2020 CBC showed wbc 14.2, increased ANC 10.26. decreased eosinophil.  ? ?Denies weight loss, fever, chills,night sweats.  ?Some fatigue ? ? ?Review of Systems  ?Constitutional:  Positive for fatigue. Negative for appetite change, chills and fever.  ?HENT:   Negative for hearing loss and voice change.   ?Eyes:   ?     Legally blind  ?Respiratory:  Negative for chest tightness and cough.   ?Cardiovascular:  Negative for chest pain.  ?Gastrointestinal:  Negative for abdominal distention, abdominal pain and blood in stool.  ?Endocrine: Negative for hot flashes.  ?Genitourinary:  Negative for difficulty urinating and frequency.   ?Musculoskeletal:  Negative for arthralgias.  ?Skin:  Negative for itching and rash.  ?Neurological:  Negative for extremity weakness.  ?Hematological:  Negative for adenopathy.  ?Psychiatric/Behavioral:  Negative for confusion.   ? ?MEDICAL HISTORY:  ?Past Medical History:  ?Diagnosis Date  ? Anemia   ? Anxiety 08/01/2014  ? Arthritis, degenerative 07/30/2013  ? Clinical depression 06/30/2014  ? Depression   ? Elevated liver enzymes   ? GERD (gastroesophageal reflux disease)   ? H/O breast biopsy 01/05/2015  ? H/O: attempted suicide 2013  ? Hepatitis B   ? Hiatal hernia   ? History of hysterectomy 01/05/2015  ? History of peptic ulcer disease 01/05/2015  ?  Hypercholesteremia   ? Hypertension   ? Legally blind   ? Panic attacks   ? PUD (peptic ulcer disease)   ? Rectocele 01/05/2015  ? Retinitis pigmentosa   ? S/P cholecystectomy 01/05/2015  ? ? ?SURGICAL HISTORY: ?Past Surgical History:  ?Procedure Laterality Date  ? ABDOMINAL HYSTERECTOMY    ? APPENDECTOMY    ? BREAST BIOPSY Bilateral 1977  ? neg  ? CHOLECYSTECTOMY    ? COLONOSCOPY    ? ESOPHAGOGASTRODUODENOSCOPY    ? ESOPHAGOGASTRODUODENOSCOPY (EGD) WITH PROPOFOL N/A 09/17/2017  ? Procedure: ESOPHAGOGASTRODUODENOSCOPY (EGD) WITH PROPOFOL;  Surgeon: Toledo, Benay Pike, MD;  Location: [facility];  Service: Gastroenterology;  Laterality: N/A;  ? HEMORRHOID SURGERY N/A 03/07/2017  ? Procedure: HEMORRHOIDECTOMY;  Surgeon: Leonie Green, MD;  Location: [facility];  Service: General;  Laterality: N/A;  ? HERNIA REPAIR    ? OOPHORECTOMY    ? RECTOCELE REPAIR    ? SPHINCTEROTOMY N/A 01/24/2017  ? Procedure: SPHINCTEROTOMY;  Surgeon: Leonie Green, MD;  Location: [facility];  Service: General;  Laterality: N/A;  ? ? ?SOCIAL HISTORY: ?Social History  ? ?Socioeconomic History  ? Marital status: Widowed  ?  Spouse name: Not on file  ? Number of children: Not on file  ? Years of education: Not on file  ? Highest education level: Not on file  ?Occupational History  ? Not on file  ?Tobacco Use  ? Smoking status: Former  ?  Types: Cigarettes  ?  Quit date: 08/30/1973  ?  Years since quitting: 47.8  ?  Smokeless tobacco: Never  ?Vaping Use  ? Vaping Use: Never used  ?Substance and Sexual Activity  ? Alcohol use: No  ?  Alcohol/week: 0.0 standard drinks  ? Drug use: No  ? Sexual activity: Not on file  ?Other Topics Concern  ? Not on file  ?Social History Narrative  ? Not on file  ? ?Social Determinants of Health  ? ?Financial Resource Strain: Not on file  ?Food Insecurity: Not on file  ?Transportation Needs: Not on file  ?Physical Activity: Not on file  ?Stress: Not on file  ?Social Connections: Not on file  ?Intimate Partner  Violence: Not on file  ? ? ?FAMILY HISTORY: ?Family History  ?Problem Relation Age of Onset  ? Breast cancer Mother   ? Hypertension Mother   ? Diabetes Mother   ? Dementia Mother   ? Cancer Father   ? Prostate cancer Father   ? Breast cancer Sister   ? ? ?ALLERGIES:  is allergic to trazodone, codeine, cortisone, oxycontin [oxycodone hcl], zolpidem, hydrocodone-acetaminophen, and oxycodone-acetaminophen. ? ?MEDICATIONS:  ?Current Outpatient Medications  ?Medication Sig Dispense Refill  ? acetaminophen (TYLENOL) 325 MG tablet Take 650 mg by mouth every 6 (six) hours as needed.    ? albuterol (VENTOLIN HFA) 108 (90 Base) MCG/ACT inhaler Inhale 2 puffs into the lungs every 6 (six) hours as needed for wheezing or shortness of breath.    ? amitriptyline (ELAVIL) 25 MG tablet Take 25 mg by mouth at bedtime.    ? amLODipine (NORVASC) 5 MG tablet Take 5 mg by mouth daily.    ? aspirin 81 MG tablet Take 81 mg by mouth daily.    ? doxylamine, Sleep, (SLEEP AID) 25 MG tablet Take 25 mg by mouth at bedtime as needed.    ? esomeprazole (NEXIUM) 40 MG capsule Take by mouth.    ? famotidine (PEPCID) 40 MG tablet Take 1 tablet by mouth at bedtime.    ? fluticasone (FLONASE) 50 MCG/ACT nasal spray Place 2 sprays into both nostrils daily.    ? gabapentin (NEURONTIN) 400 MG capsule Take 1 capsule (400 mg total) by mouth 3 (three) times daily. 90 capsule 5  ? hydrochlorothiazide (HYDRODIURIL) 12.5 MG tablet TAKE ONE TABLET BY MOUTH ONCE DAILY AS NEEDED FOR SWELLING OF FEET    ? [START ON 08/21/2021] HYDROcodone-acetaminophen (NORCO) 5-325 MG tablet Take 1 tablet by mouth every 6 (six) hours as needed for moderate pain. 120 tablet 0  ? [START ON 07/22/2021] HYDROcodone-acetaminophen (NORCO) 5-325 MG tablet Take 1 tablet by mouth every 6 (six) hours as needed for moderate pain. 120 tablet 0  ? HYDROcodone-acetaminophen (NORCO) 5-325 MG tablet Take 1 tablet by mouth every 6 (six) hours as needed for moderate pain. 120 tablet 0  ? hydrOXYzine  (ATARAX) 25 MG tablet Take 1 tablet by mouth 3 (three) times daily as needed.    ? levocetirizine (XYZAL) 5 MG tablet Take 5 mg by mouth every evening.    ? Magnesium 500 MG CAPS Take 1 capsule (500 mg total) by mouth at bedtime. 180 capsule 0  ? meclizine (ANTIVERT) 25 MG tablet Take 1 tablet (25 mg total) by mouth 3 (three) times daily as needed for dizziness. 30 tablet 1  ? Melatonin 10 MG TABS Take 2 tablets by mouth as needed (TAKES WITH LUNESTA IF THE LUNESTA DOES NOT HELP).    ? methocarbamol (ROBAXIN) 500 MG tablet Take 1 tablet (500 mg total) by mouth every 8 (eight) hours as needed for  muscle spasms. 90 tablet 2  ? montelukast (SINGULAIR) 10 MG tablet Take 10 mg by mouth at bedtime.    ? promethazine (PHENERGAN) 25 MG tablet Take 25 mg by mouth every 6 (six) hours as needed for nausea.    ? QUEtiapine (SEROQUEL) 50 MG tablet Take 50 mg by mouth at bedtime.    ? simvastatin (ZOCOR) 20 MG tablet Take 20 mg by mouth at bedtime.     ? traMADol (ULTRAM-ER) 100 MG 24 hr tablet Take 1 tablet (100 mg total) by mouth daily. 30 tablet 2  ? valsartan (DIOVAN) 160 MG tablet Take 160 mg by mouth daily.    ? vitamin B-12 (CYANOCOBALAMIN) 500 MCG tablet Take 500 mcg by mouth daily.    ? predniSONE (DELTASONE) 10 MG tablet Take 1 tablet (10 mg total) by mouth daily. 6,5,4,3,2,1 six day taper (Patient not taking: Reported on 06/12/2021) 21 tablet 0  ? ?No current facility-administered medications for this visit.  ? ? ? ?PHYSICAL EXAMINATION: ?ECOG PERFORMANCE STATUS: 1 - Symptomatic but completely ambulatory ?Vitals:  ? 06/26/21 1457  ?BP: 136/81  ?Pulse: 92  ?Resp: 18  ?Temp: (!) 96.9 ?F (36.1 ?C)  ? ?Filed Weights  ? 06/26/21 1457  ?Weight: 142 lb 14.4 oz (64.8 kg)  ? ? ?Physical Exam ?Constitutional:   ?   General: She is not in acute distress. ?HENT:  ?   Head: Normocephalic and atraumatic.  ?Eyes:  ?   General: No scleral icterus. ?Cardiovascular:  ?   Rate and Rhythm: Normal rate and regular rhythm.  ?   Heart sounds:  Normal heart sounds.  ?Pulmonary:  ?   Effort: Pulmonary effort is normal. No respiratory distress.  ?   Breath sounds: No wheezing.  ?Abdominal:  ?   General: Bowel sounds are normal. There is no distension.  ?

## 2021-06-27 LAB — HEPATITIS PANEL, ACUTE
HCV Ab: NONREACTIVE
Hep A IgM: NONREACTIVE
Hep B C IgM: NONREACTIVE
Hepatitis B Surface Ag: NONREACTIVE

## 2021-06-27 LAB — KAPPA/LAMBDA LIGHT CHAINS
Kappa free light chain: 13.2 mg/L (ref 3.3–19.4)
Kappa, lambda light chain ratio: 1.43 (ref 0.26–1.65)
Lambda free light chains: 9.2 mg/L (ref 5.7–26.3)

## 2021-06-27 LAB — TECHNOLOGIST SMEAR REVIEW
Plt Morphology: NORMAL
RBC MORPHOLOGY: NORMAL
WBC MORPHOLOGY: NORMAL

## 2021-06-27 LAB — HIV ANTIBODY (ROUTINE TESTING W REFLEX): HIV Screen 4th Generation wRfx: NONREACTIVE

## 2021-06-28 LAB — ANTINUCLEAR ANTIBODIES, IFA: ANA Ab, IFA: NEGATIVE

## 2021-06-29 LAB — COMP PANEL: LEUKEMIA/LYMPHOMA

## 2021-06-29 LAB — MULTIPLE MYELOMA PANEL, SERUM
Albumin SerPl Elph-Mcnc: 3.7 g/dL (ref 2.9–4.4)
Albumin/Glob SerPl: 1.2 (ref 0.7–1.7)
Alpha 1: 0.3 g/dL (ref 0.0–0.4)
Alpha2 Glob SerPl Elph-Mcnc: 0.9 g/dL (ref 0.4–1.0)
B-Globulin SerPl Elph-Mcnc: 1.4 g/dL — ABNORMAL HIGH (ref 0.7–1.3)
Gamma Glob SerPl Elph-Mcnc: 0.7 g/dL (ref 0.4–1.8)
Globulin, Total: 3.2 g/dL (ref 2.2–3.9)
IgA: 276 mg/dL (ref 87–352)
IgG (Immunoglobin G), Serum: 680 mg/dL (ref 586–1602)
IgM (Immunoglobulin M), Srm: 86 mg/dL (ref 26–217)
Total Protein ELP: 6.9 g/dL (ref 6.0–8.5)

## 2021-07-13 ENCOUNTER — Telehealth: Payer: Self-pay | Admitting: *Deleted

## 2021-07-13 NOTE — Telephone Encounter (Signed)
Looks like pain medication is prescribed by Dr. Holley Raring.

## 2021-07-13 NOTE — Telephone Encounter (Signed)
Patient called asking for results of labs and asking for pain medicine or to let her know if she needs to call her pain doctor for pain medicine. Please advise  CBC with Differential/Platelet Order: [id number] Status: Final result    Visible to patient: No (inaccessible in MyChart)    Next appt: 07/17/2021 at 03:00 PM in Oncology Eyvonne Mechanic)    Dx: Lymphocytosis    0 Result Notes           Component Ref Range & Units 2 wk ago (06/26/21) 9 mo ago (09/30/20) 9 mo ago (09/29/20) 9 mo ago (09/27/20) 4 yr ago (01/16/17) 5 yr ago (03/16/16) 6 yr ago (05/13/15)  WBC 4.0 - 10.5 K/uL 8.9  5.8  5.9  7.0  7.1 R  4.1 R  6.7 R   RBC 3.87 - 5.11 MIL/uL 4.57  3.83 Low   3.77 Low   3.85 Low   4.55 R  4.21 R  4.32 R   Hemoglobin 12.0 - 15.0 g/dL 13.3  11.4 Low   11.2 Low   11.5 Low   13.1 R  12.3 R  12.1 R   HCT 36.0 - 46.0 % 41.6  34.1 Low   33.6 Low   34.8 Low   39.6 R  36.2 R  36.4 R   MCV 80.0 - 100.0 fL 91.0  89.0  89.1  90.4  87.1  85.8  84.2   MCH 26.0 - 34.0 pg 29.1  29.8  29.7  29.9  28.9  29.1  28.0   MCHC 30.0 - 36.0 g/dL 32.0  33.4  33.3  33.0  33.2 R  33.9 R  33.2 R   RDW 11.5 - 15.5 % 13.8  13.2  13.3  13.2  13.9 R  13.5 R  15.4 High  R   Platelets 150 - 400 K/uL 400  307  265  269  300 R  265 R  332 R   nRBC 0.0 - 0.2 % 0.0  0.0  0.0  0.0      Neutrophils Relative % % 59  43  50  65   65  54   Neutro Abs 1.7 - 7.7 K/uL 5.3  2.5  3.0  4.5   2.6 R  3.6 R   Lymphocytes Relative % 30  44  35  25   28  35   Lymphs Abs 0.7 - 4.0 K/uL 2.7  2.6  2.1  1.8   1.1 R  2.4 R   Monocytes Relative % _0 Monocytes Absolute 0.1 - 1.0 K/uL 0.8  0.6  0.8  0.6   0.3 R  0.6 R   Eosinophils Relative % _1 0  1   Eosinophils Absolute 0.0 - 0.5 K/uL 0.1  0.0  0.0  0.1   0.0 R  0.0 R   Basophils Relative % 0  1  0  0   0  1   Basophils Absolute 0.0 - 0.1 K/uL 0.0  0.0  0.0  0.0   0.0 R  0.0 R   Immature Granulocytes % 1  0  0  0      Abs Immature Granulocytes 0.00 - 0.07 K/uL 0.06   0.02 CM  0.01 CM  0.02 CM      Comment: Performed at [facility], [place],  [place], [place] [address]  Resulting Agency  _0  [place] [facility] [place] [facility]         Specimen Collected: 06/26/21 15:40 Last Resulted: 06/26/21 15:56      Lab Flowsheet    Order Details    View Encounter    Lab and Collection Details    Routing    Result History    View Encounter Conversation      CM=Additional comments  R=Reference range differs from displayed range      Result Care Coordination   Patient Communication   Add Comments   Not seen Back to Top       Other Results from 06/26/2021  Technologist smear review Order: [id number] Status: Final result    Visible to patient: No (inaccessible in [place])    Next appt: 07/17/2021 at 03:00 PM in Oncology Eyvonne Mechanic)    Dx: Lymphocytosis    0 Result Notes    Component 2 wk ago  WBC MORPHOLOGY Normal RBC, WBC, and platelet   RBC MORPHOLOGY Normal RBC, WBC, and platelet   Tech Review Normal RBC, WBC, and platelet   Comment: Performed at [facility], [address] [place], [place] [address]  Resulting Agency [facility] LAB         Specimen Collected: 06/26/21 15:41 Last Resulted: 06/27/21 08:09      Lab Flowsheet    Order Details    View Encounter    Lab and Collection Details    Routing    Result History    View Encounter Conversation        Result Care Coordination   Patient Communication   Add Comments   Not seen Back to Top         TSH Order: [id number] Status: Final result    Visible to patient: No (inaccessible in [place])    Next appt: 07/17/2021 at 03:00 PM in Oncology Eyvonne Mechanic)    Dx: Lymphocytosis    0 Result Notes     Component Ref Range & Units 2 wk ago  TSH 0.350 - 4.500 uIU/mL 1.684   Comment: Performed by a 3rd Generation assay with a functional sensitivity of <=0.01 uIU/mL.  Performed at [facility], [address] [place], [place] [address]   Resulting Agency  [facility] LAB         Specimen Collected: 06/26/21 15:40 Last Resulted: 06/26/21 17:03      Lab Flowsheet    Order Details    View Encounter    Lab and Collection Details    Routing    Result History    View Encounter Conversation        Result Care Coordination   Patient Communication   Add Comments   Not seen Back to Top          Contains abnormal data Lactate dehydrogenase Order: [id number] Status: Final result    Visible to patient: No (inaccessible in MyChart)    Next appt: 07/17/2021 at 03:00 PM in Oncology Eyvonne Mechanic)    Dx: Lymphocytosis    0 Result Notes      Component Ref Range & Units 2 wk ago 9 mo ago  LDH 98 - 192 U/L 75 Low   178 CM   Comment: Performed at [facility], [address], [place], [place] [address]  [place]  [facility] [place]  LAB         Specimen Collected: 06/26/21 15:40 Last Resulted: 06/26/21 16:10      Lab Flowsheet    Order Details    View Encounter    Lab and Collection Details    Routing    Result History    View Encounter Conversation      CM=Additional comments      Result Care Coordination   Patient Communication   Add Comments   Not seen Back to Top         Hepatitis panel, acute Order: [id number] Status: Final result    Visible to patient: No (inaccessible in [place])    Next appt: 07/17/2021 at 03:00 PM in Oncology Eyvonne Mechanic)    Dx: Lymphocytosis    0 Result Notes   1 HM Topic     Component Ref Range & Units 2 wk ago  Hepatitis B Surface Ag NON REACTIVE NON REACTIVE   HCV Ab NON REACTIVE NON REACTIVE   Comment: (NOTE)  Nonreactive HCV antibody screen is consistent with no HCV infections,  unless recent infection is suspected or other evidence exists to  indicate HCV infection.    Hep A IgM NON REACTIVE NON REACTIVE   Hep B C IgM NON REACTIVE NON REACTIVE   Comment: Performed at [facility], [place] [address], [place], [place] [address]  Resulting Agency  [facility] CLIN LAB         Specimen Collected: 06/26/21 15:40 Last Resulted: 06/27/21 02:39      Lab Flowsheet    Order Details    View Encounter    Lab and Collection Details    Routing    Result History    View Encounter Conversation        Result Care Coordination   Patient Communication   Add Comments   Not seen Back to Top      Satisfied Health Maintenance Topics     Back to Top Hepatitis C Screening   Completed  Address Topic         HIV Antibody (routine testing w rflx) Order: [id number] Status: Final result    Visible to patient: No (inaccessible in MyChart)    Next appt: 07/17/2021 at 03:00 PM in Oncology Eyvonne Mechanic)    Dx: Lymphocytosis    0 Result Notes      Component Ref Range & Units 2 wk ago 9 mo ago  HIV Screen 4th Generation wRfx Non Reactive Non Reactive  Non Reactive CM   Comment: Performed at [facility], [place] [address] [place], [place] [address]  Resulting Agency  [facility] LAB         Specimen Collected: 06/26/21 15:40 Last Resulted: 06/27/21 01:02      Lab Flowsheet    Order Details    View Encounter    Lab and Collection Details    Routing    Result History    View Encounter Conversation      CM=Additional comments      Result Care Coordination   Patient Communication   Add Comments   Not seen Back to Top         ANA, IFA (with reflex) Order: [id number] Status: Final result    Visible to patient: No (inaccessible in [place])    Next appt: 07/17/2021 at 03:00 PM in Oncology Eyvonne Mechanic)    Dx: Lymphocytosis    0 Result Notes  Component 2 wk ago  ANA Ab, IFA Negative   Comment: (NOTE)                                      Negative   <1:80                                      Borderline  1:80                                      Positive   >1:80  ICAP nomenclature: [id number]  For more information about Hep-2 cell patterns use  ANApatterns.org,  the official website for the International  Consensus on Antinuclear Antibody (ANA) Patterns (ICAP).  Performed At: [facility]  [place], [place] [id number]  Rush Farmer MD [id number]:[id number]   Resulting Agency [facility] CLIN LAB         Specimen Collected: 06/26/21 15:40 Last Resulted: 06/28/21 06:37      Lab Flowsheet    Order Details    View Encounter    Lab and Collection Details    Routing    Result History    View Encounter Conversation        Result Care Coordination   Patient Communication   Add Comments   Not seen Back to Top         Flow cytometry panel-leukemia/lymphoma work-up Order: [id number] Status: Edited Result - FINAL    Visible to patient: No (inaccessible in MyChart)    Next appt: 07/17/2021 at 03:00 PM in Oncology Eyvonne Mechanic)    Dx: Lymphocytosis    0 Result Notes    Component 2 wk ago  PATH INTERP XXX-IMP Comment   Comment: (NOTE)  1)  No diagnostic immunophenotypic abnormality detected  2)  20% of T cells show loss of CD7 expression, see comment   ANNOTATION COMMENT IMP Comment VC   Comment: (NOTE)  Loss of CD7 expression may be seen in reactive and neoplastic  processes. If  clinically indicated, T-cell receptor gene rearrangement studies can  be  performed to confirm or exclude a clonal T-cell process.   CLINICAL INFO Comment VC   Comment: (NOTE)  Accompanying CBC dated 06/26/2021 shows: WBC count 8.9, Neu 5.3, Lym  2.7,  Mon 0.8.   Specimen Type Comment   Comment: Peripheral blood  ASSESSMENT OF LEUKOCYTES Comment   Comment: (NOTE)  No monoclonal B cell population is detected. kappa:lambda ratio 1.9  CD4:CD8 ratio 1.2  20% of T cells show loss of CD7 expression, a finding that can be  seen in  both reactive and neoplastic processes.  A small population of double positive (CD4+/CD8+) T cells is detected,  representing 8% of the T cells. Double positive T cells have been  described  in association with  chronic viral infections, autoimmune disorders,  chronic  inflammatory disorders, and immunodeficiency states.  No circulating blasts are detected.  There is no immunophenotypic evidence of abnormal myeloid maturation.  Analysis of the lymphocyte population shows: B cells 12%, T cells  83%, NK  cells 5%.   % Viable Cells Comment VC   Comment: 89%  ANALYSIS AND GATING STRATEGY Comment   Comment: 8 color analysis with CD45/SSC gating  IMMUNOPHENOTYPING STUDY Comment   Comment: (NOTE)  CD2       Normal         CD3       Normal  CD4       Normal         CD5       Normal  CD7       See Text       CD8       Normal  CD10      Normal         CD11b     Normal  CD13      Normal         CD14      Normal  CD16      Normal         CD19      Normal  CD20      Normal         CD33      Normal  CD34      Normal         CD38      Normal  CD45      Normal         CD56      Normal  CD57      Normal         CD117     Normal  HLA-DR    Normal         KAPPA     Normal  LAMBDA    Normal         CD64      Normal   PATHOLOGIST NAME Comment   Comment: Lovett Sox, M.D.  COMMENT: Comment VC   Comment: (NOTE)  Each antibody in this assay was utilized to assess for potential  abnormalities of studied cell populations or to characterize  identified abnormalities.  This test was developed and its performance characteristics  determined by [facility].  It has not been cleared or approved by the  U.S. Food and Drug Administration.  The FDA has determined that such clearance or approval is not  necessary. This test is used for clinical purposes.  It should not  be regarded as investigational or for research.  Performed At: [facility]  [place], [place] [id number]  Katina Degree MDPhD [id number]  Performed At: [place], [place] [id number]  Katina Degree MDPhD HE:[id number]   Resulting Agency [facility] CLIN LAB         Specimen Collected: 06/26/21 15:40 Last  Resulted: 06/29/21 11:36      Lab Flowsheet    Order Details    View Encounter    Lab and Collection Details    Routing    Result History    View Encounter Conversation      VC=Value has a corrected status       Result Care Coordination   Patient Communication   Add Comments   Not seen Back to Top         Kappa/lambda light chains Order: [id number] Status: Final result    Visible to patient: No (inaccessible in [place])    Next appt: 07/17/2021 at 03:00 PM in Oncology Eyvonne Mechanic)    Dx: Lymphocytosis    0 Result Notes     Component Ref Range & Units 2 wk ago  Kappa free light chain 3.3 - 19.4 mg/L 13.2   Lambda free  light chains 5.7 - 26.3 mg/L 9.2   Kappa, lambda light chain ratio 0.26 - 1.65 1.43   Comment: (NOTE)  Performed At: [facility]  [place], [place] [id number]  Rush Farmer MD [id number]   Resulting Agency  [facility] CLIN LAB         Specimen Collected: 06/26/21 15:40 Last Resulted: 06/27/21 14:36      Lab Flowsheet    Order Details    View Encounter    Lab and Collection Details    Routing    Result History    View Encounter Conversation        Result Care Coordination   Patient Communication   Add Comments   Not seen Back to Top          Contains abnormal data Multiple Myeloma Panel (SPEP&IFE w/QIG) Order: [id number] Status: Edited Result - FINAL    Visible to patient: No (inaccessible in MyChart)    Next appt: 07/17/2021 at 03:00 PM in Oncology Eyvonne Mechanic)    Dx: Lymphocytosis    0 Result Notes     Component Ref Range & Units 2 wk ago  IgG (Immunoglobin G), Serum 586 - 1,602 mg/dL 680   IgA 87 - 352 mg/dL 276   IgM (Immunoglobulin M), Srm 26 - 217 mg/dL 86   Total Protein ELP 6.0 - 8.5 g/dL 6.9 VC   Albumin SerPl Elph-Mcnc 2.9 - 4.4 g/dL 3.7 VC   Alpha 1 0.0 - 0.4 g/dL 0.3 VC   Alpha2 Glob SerPl Elph-Mcnc 0.4 - 1.0 g/dL 0.9 VC   B-Globulin SerPl Elph-Mcnc 0.7 - 1.3 g/dL 1.4 High  VC   Gamma  Glob SerPl Elph-Mcnc 0.4 - 1.8 g/dL 0.7 VC   M Protein SerPl Elph-Mcnc Not Observed g/dL Not Observed VC   Globulin, Total 2.2 - 3.9 g/dL 3.2 VC   Albumin/Glob SerPl 0.7 - 1.7 1.2 VC   IFE 1  Comment VC   Comment: (NOTE)  The immunofixation pattern appears unremarkable. Evidence of  monoclonal protein is not apparent.   Please Note  Comment VC   Comment: (NOTE)  Protein electrophoresis scan will follow via computer, mail, or  courier delivery.  Performed At: [facility]  [place], [place] [id number]  Rush Farmer MD OE:[id number]   Resulting Agency  [facility] CLIN LAB         Specimen Collected: 06/26/21 15:40 Last Resulted: 06/29/21 17:36      Lab Flowsheet    Order Details    View Encounter    Lab and Collection Details    Routing    Result History    View Encounter Conversation      VC=Value has a corrected status       Result Care Coordination   Patient Communication   Add Comments   Not seen Back to Top          Contains abnormal data Comprehensive metabolic panel Order: [id number] Status: Final result    Visible to patient: No (inaccessible in [place])    Next appt: 07/17/2021 at 03:00 PM in Oncology Eyvonne Mechanic)    Dx: Lymphocytosis    0 Result Notes           Component Ref Range & Units 2 wk ago (06/26/21) 9 mo ago (09/30/20) 9 mo ago (09/29/20) 9 mo ago (09/27/20) 4 yr ago (01/16/17) 5 yr ago (03/16/16) 6 yr ago (05/13/15)  Sodium 135 - 145 mmol/L 135  140  140  139  138  140  139   Potassium 3.5 - 5.1 mmol/L 3.7  3.3 Low   3.7  3.4 Low   4.2  2.9 Low   3.6   Chloride 98 - 111 mmol/L 98  106  109  108  101 R  107 R  104 R   CO2 22 - 32 mmol/L _0 Glucose, Bld 70 - 99 mg/dL 125 High   98 CM  92 CM  121 High  CM  80 R  139 High  R  105 High  R   Comment: Glucose reference range applies only to samples taken after fasting for at least 8 hours.  BUN 8 - 23 mg/dL _1 R  11 R  12 R   Creatinine,  Ser 0.44 - 1.00 mg/dL 0.77  0.59  0.62  0.74  0.75  0.61  0.71   Calcium 8.9 - 10.3 mg/dL 9.5  9.0  8.2 Low   8.4 Low   9.4  7.8 Low   9.3   Total Protein 6.5 - 8.1 g/dL 7.4  6.7  6.2 Low   6.8    7.8   Albumin 3.5 - 5.0 g/dL 4.1  3.8  3.7  4.2    4.5   AST 15 - 41 U/L 32  46 High   76 High   166 High     24   ALT 0 - 44 U/L 36  61 High   79 High   91 High     13 Low  R   Alkaline Phosphatase 38 - 126 U/L 71  91  94  98    75   Total Bilirubin 0.3 - 1.2 mg/dL 0.6  0.6  0.5  0.7    0.4   GFR, Estimated >60 mL/min >60  >60 CM  >60 CM  >60 CM      Comment: (NOTE)  Calculated using the CKD-EPI Creatinine Equation (2021)   Anion gap 5 - _2 CM  4 Low  CM  6 CM  _3 Comment: Performed at [facility], [address] [place], [place] [address]  Resulting Agency  _4  [facility] CL[facility] LAB [facility] LAB         Specimen Collected: 06/26/21 15:40 Last Resulted: 06/26/21 16:10

## 2021-07-13 NOTE — Telephone Encounter (Signed)
Call to patient and advised that Dr Tasia Catchings is out of office this afternoon and it would be Tuesday before she would see this note and that she needs to get in touch with whoever orders her pain medicine for more. She thanked me for letting her know

## 2021-07-17 ENCOUNTER — Encounter: Payer: Self-pay | Admitting: Licensed Clinical Social Worker

## 2021-07-17 ENCOUNTER — Inpatient Hospital Stay (HOSPITAL_BASED_OUTPATIENT_CLINIC_OR_DEPARTMENT_OTHER): Payer: Medicare PPO | Admitting: Licensed Clinical Social Worker

## 2021-07-17 ENCOUNTER — Telehealth: Payer: Self-pay | Admitting: Student in an Organized Health Care Education/Training Program

## 2021-07-17 ENCOUNTER — Inpatient Hospital Stay: Payer: Medicare PPO

## 2021-07-17 DIAGNOSIS — Z800 Family history of malignant neoplasm of digestive organs: Secondary | ICD-10-CM | POA: Diagnosis not present

## 2021-07-17 DIAGNOSIS — Z8042 Family history of malignant neoplasm of prostate: Secondary | ICD-10-CM | POA: Diagnosis not present

## 2021-07-17 DIAGNOSIS — Z803 Family history of malignant neoplasm of breast: Secondary | ICD-10-CM | POA: Diagnosis not present

## 2021-07-17 NOTE — Telephone Encounter (Signed)
Please call patient regarding her medications.

## 2021-07-17 NOTE — Progress Notes (Signed)
REFERRING PROVIDER: Earlie Server, MD [place],  [place] [address]  PRIMARY PROVIDER:  Baxter Hire, MD  PRIMARY REASON FOR VISIT:  1. Family history of breast cancer   2. Family history of prostate cancer      HISTORY OF PRESENT ILLNESS:   Sandra [name], a 66 y.o. female, was seen for a [place] cancer genetics consultation at the request of Dr. Tasia Catchings due to a family history of cancer.  Sandra [name] presents to clinic today to discuss the possibility of a hereditary predisposition to cancer, genetic testing, and to further clarify her future cancer risks, as well as potential cancer risks for family members.   Sandra [name] is a 66 y.o. female with no personal history of cancer.  She is seeing Dr. Tasia Catchings for workup for lymphocytosis.   CANCER HISTORY:  Oncology History   No history exists.     RISK FACTORS:  Menarche was at age 56.  First live birth at age 47.  OCP use for approximately 0 years.  Ovaries intact: no.  Hysterectomy: yes.  Menopausal status: postmenopausal.  HRT use: 0 years. Colonoscopy: yes; normal.  Past Medical History:  Diagnosis Date   Anemia    Anxiety 08/01/2014   Arthritis, degenerative 07/30/2013   Clinical depression 06/30/2014   Depression    Elevated liver enzymes    GERD (gastroesophageal reflux disease)    H/O breast biopsy 01/05/2015   H/O: attempted suicide 2013   Hepatitis B    Hiatal hernia    History of hysterectomy 01/05/2015   History of peptic ulcer disease 01/05/2015   Hypercholesteremia    Hypertension    Legally blind    Panic attacks    PUD (peptic ulcer disease)    Rectocele 01/05/2015   Retinitis pigmentosa    S/P cholecystectomy 01/05/2015    Past Surgical History:  Procedure Laterality Date   ABDOMINAL HYSTERECTOMY     APPENDECTOMY     BREAST BIOPSY Bilateral 1977   neg   CHOLECYSTECTOMY     COLONOSCOPY     ESOPHAGOGASTRODUODENOSCOPY     ESOPHAGOGASTRODUODENOSCOPY (EGD) WITH PROPOFOL N/A 09/17/2017    Procedure: ESOPHAGOGASTRODUODENOSCOPY (EGD) WITH PROPOFOL;  Surgeon: Toledo, Benay Pike, MD;  Location: [facility] END[facility];  Service: Gastroenterology;  Laterality: N/A;   HEMORRHOID SURGERY N/A 03/07/2017   Procedure: HEMORRHOIDECTOMY;  Surgeon: Leonie Green, MD;  Location: [facility] ORS;  Service: General;  Laterality: N/A;   HERNIA REPAIR     OOPHORECTOMY     RECTOCELE REPAIR     SPHINCTEROTOMY N/A 01/24/2017   Procedure: SPHINCTEROTOMY;  Surgeon: Leonie Green, MD;  Location: [facility] ORS;  Service: General;  Laterality: N/A;    FAMILY HISTORY:  We obtained a detailed, 4-generation family history.  Significant diagnoses are listed below: Family History  Problem Relation Age of Onset   Breast cancer Mother    Hypertension Mother    Diabetes Mother    Dementia Mother    Cancer Father    Prostate cancer Father    Breast cancer Sister    Sandra [name] has 2 sons and 1 daughter. She has 2 brothers and 1 sister. Her sister had breast and kidney cancer in her 57s and is living at 77.   Sandra [name]'s mother died of breast cancer at 83. Patient has 11 or 12 maternal aunts/uncles. One aunt had pancreatic cancer in her 43s, an uncle died of bone cancer in his 63s. No other known cancers on this side of  the family.  Sandra [name]'s father has prostate cancer spread to his bones diagnosed at 18. Patient's paternal grandparents both had leukemia.   [name] is unaware of previous family history of genetic testing for hereditary cancer risks. There is no reported Ashkenazi Jewish ancestry. There is no known consanguinity.    GENETIC COUNSELING ASSESSMENT: Ms. Hammer is a 66 y.o. female with a family history of breast cancer which is somewhat suggestive of a hereditary cancer syndrome and predisposition to cancer. We, therefore, discussed and recommended the following at today's visit.   DISCUSSION: We discussed that approximately 10% of breast cancer is hereditary. Most cases of  hereditary breast cancer are associated with BRCA1/BRCA2 genes, although there are other genes associated with hereditary cancer as well. Cancers and risks are gene specific. We discussed that testing is beneficial for several reasons including knowing about cancer risks, identifying potential screening and risk-reduction options that may be appropriate, and to understand if other family members could be at risk for cancer and allow them to undergo genetic testing.   We reviewed the characteristics, features and inheritance patterns of hereditary cancer syndromes. We also discussed genetic testing, including the appropriate family members to test, the process of testing, insurance coverage and turn-around-time for results. We discussed the implications of a negative, positive and/or variant of uncertain significant result. We recommended Sandra [name] pursue genetic testing for the [employer] CancerNext-Expanded+RNA gene panel.   The CancerNext-Expanded + RNAinsight gene panel offered by [employer] and includes sequencing and rearrangement analysis for the following 77 genes: IP, ALK, APC*, ATM*, AXIN2, BAP1, BARD1, BLM, BMPR1A, BRCA1*, BRCA2*, BRIP1*, CDC73, CDH1*,CDK4, CDKN1B, CDKN2A, CHEK2*, CTNNA1, DICER1, FANCC, FH, FLCN, GALNT12, KIF1B, LZTR1, MAX, MEN1, MET, MLH1*, MSH2*, MSH3, MSH6*, MUTYH*, NBN, NF1*, NF2, NTHL1, PALB2*, PHOX2B, PMS2*, POT1, PRKAR1A, PTCH1, PTEN*, RAD51C*, RAD51D*,RB1, RECQL, RET, SDHA, SDHAF2, SDHB, SDHC, SDHD, SMAD4, SMARCA4, SMARCB1, SMARCE1, STK11, SUFU, TMEM127, TP53*,TSC1, TSC2, VHL and XRCC2 (sequencing and deletion/duplication); EGFR, EGLN1, HOXB13, KIT, MITF, PDGFRA, POLD1 and POLE (sequencing only); EPCAM and GREM1 (deletion/duplication only).  Based on Sandra [name]'s family history of cancer, she meets medical criteria for genetic testing. Despite that she meets criteria, she may still have an out of pocket cost. We discussed that if her out of pocket cost for testing is over  $100, the laboratory will call and confirm whether she wants to proceed with testing.  If the out of pocket cost of testing is less than $100 she will be billed by the genetic testing laboratory.   PLAN: After considering the risks, benefits, and limitations, Sandra [name] provided informed consent to pursue genetic testing and the blood sample was sent to [facility] for analysis of the CancerNext-Expanded+RNA panel. Results should be available within approximately 2-3 weeks' time, at which point they will be disclosed by telephone to Sandra [name], as will any additional recommendations warranted by these results. Sandra [name] will receive a summary of her genetic counseling visit and a copy of her results once available. This information will also be available in Epic.   Sandra [name]'s questions were answered to her satisfaction today. Our contact information was provided should additional questions or concerns arise. Thank you for the referral and allowing [place] to share in the care of your patient.   Faith Rogue, MS, [facility] Genetic Counselor [email or web address] Phone: [phone]  The patient was seen for a total of 25 minutes in face-to-face genetic counseling.  Dr. Grayland Ormond was available for discussion regarding this case.   _______________________________________________________________________ For Office  Staff:  Number of people involved in session: 1 Was an Intern/ student involved with case: no

## 2021-07-17 NOTE — Telephone Encounter (Signed)
Patient would like her medication changed.  Instructed patient that she needed to make an appointment for medication changes.

## 2021-07-19 ENCOUNTER — Other Ambulatory Visit: Payer: Self-pay | Admitting: Student in an Organized Health Care Education/Training Program

## 2021-07-19 ENCOUNTER — Ambulatory Visit
Payer: Medicare PPO | Attending: Student in an Organized Health Care Education/Training Program | Admitting: Student in an Organized Health Care Education/Training Program

## 2021-07-19 ENCOUNTER — Encounter: Payer: Self-pay | Admitting: Student in an Organized Health Care Education/Training Program

## 2021-07-19 VITALS — BP 149/78 | HR 86 | Temp 97.7°F | Resp 16 | Ht 61.0 in | Wt 142.0 lb

## 2021-07-19 DIAGNOSIS — M79604 Pain in right leg: Secondary | ICD-10-CM | POA: Diagnosis not present

## 2021-07-19 DIAGNOSIS — G8929 Other chronic pain: Secondary | ICD-10-CM | POA: Diagnosis present

## 2021-07-19 DIAGNOSIS — M47816 Spondylosis without myelopathy or radiculopathy, lumbar region: Secondary | ICD-10-CM

## 2021-07-19 DIAGNOSIS — H5316 Psychophysical visual disturbances: Secondary | ICD-10-CM | POA: Diagnosis not present

## 2021-07-19 DIAGNOSIS — M5416 Radiculopathy, lumbar region: Secondary | ICD-10-CM | POA: Insufficient documentation

## 2021-07-19 DIAGNOSIS — M19011 Primary osteoarthritis, right shoulder: Secondary | ICD-10-CM

## 2021-07-19 DIAGNOSIS — M79605 Pain in left leg: Secondary | ICD-10-CM | POA: Insufficient documentation

## 2021-07-19 DIAGNOSIS — M170 Bilateral primary osteoarthritis of knee: Secondary | ICD-10-CM

## 2021-07-19 DIAGNOSIS — H539 Unspecified visual disturbance: Secondary | ICD-10-CM | POA: Diagnosis present

## 2021-07-19 DIAGNOSIS — G894 Chronic pain syndrome: Secondary | ICD-10-CM | POA: Diagnosis present

## 2021-07-19 MED ORDER — OXYCODONE-ACETAMINOPHEN 5-325 MG PO TABS: 1.0000 | ORAL_TABLET | Freq: Four times a day (QID) | ORAL | 0 refills | Status: DC | PRN

## 2021-07-19 MED ORDER — OXYCODONE-ACETAMINOPHEN 5-325 MG PO TABS: 1.0000 | ORAL_TABLET | Freq: Four times a day (QID) | ORAL | 0 refills | Status: AC | PRN

## 2021-07-19 NOTE — Progress Notes (Signed)
PROVIDER NOTE: Information contained herein reflects review and annotations entered in association with encounter. Interpretation of such information and data should be left to medically-trained personnel. Information provided to patient can be located elsewhere in the medical record under "Patient Instructions". Document created using STT-dictation technology, any transcriptional errors that may result from process are unintentional.    Patient: Sandra [name]  Service Category: E/M  Provider: Gillis Santa, MD  DOB: 1955-06-14  DOS: 07/19/2021  Specialty: Interventional Pain Management  MRN: [id number]  Setting: Ambulatory outpatient  PCP: Baxter Hire, MD  Type: Established Patient    Referring Provider: Baxter Hire, MD  Location: Office  Delivery: Face-to-face     HPI  Sandra [name], a 66 y.o. year old female, is here today because of her Bilateral primary osteoarthritis of knee [M17.0]. Sandra [name]'s primary complain today is Back Pain (lower) Last encounter: My last encounter with her was on 06/20/21 Pertinent problems: Sandra [name] has Long term current use of opiate analgesic; Long term prescription opiate use; Opiate use; Hallucinations, visual; Psychiatric disorder; Chronic low back pain (Location of Primary Source of Pain) (Bilateral) (R>L); Chronic bilateral knee pain; Opiate dependence ([place]); Lumbar facet arthropathy; Lumbar spondylosis; Lumbar facet syndrome (Bilateral) (R>L); Diffuse myofascial pain syndrome; [name] (CBS); Arthropathy of right elbow; and Localized osteoarthritis of right shoulder on their pertinent problem list. Pain Assessment: Severity of Chronic pain is reported as a 9 /10. Location: Back Lower/buttocks and both legs to the feet. Onset: More than a month ago. Quality: Aching, Nagging. Timing: Constant. Modifying factor(s): medications, lying down. Vitals:  height is 5' 1"  (1.549 m) and weight is 142 lb (64.4 kg). Her temporal temperature is  97.7 F (36.5 C). Her blood pressure is 149/78 (abnormal) and her pulse is 86. Her respiration is 16 and oxygen saturation is 98%.   Reason for encounter: both, medication management and post-procedure assessment.   Unfortunately, Sandra [name] having trouble managing her chronic pain.  Her intra-articular knee steroid injections were not helpful.  She is currently on tramadol ER and hydrocodone for breakthrough pain which she states is no longer effective.  She is likely dealing with the phenomena of tolerance and central sensitization as she has been on chronic opioid therapy.  We discussed drug holiday versus opioid rotation and Sandra [name] wanted to go with the opioid rotation as she states that she would not be able to tolerate a drug holiday.  I will have her discontinue tramadol and hydrocodone.  Both of these will be wasted and discarded with nursing staff help.  I will send in a prescription for oxycodone as below to see if that provides better pain management.  Patient was counseled on safe storage practices and only taking her medications as prescribed.  Patient endorsed understanding.  Post-procedure evaluation   Type: Steroid Intra-articular Knee Injection Laterality: Bilateral (-50) Level/approach: Medial Imaging guidance: None required (CPT-20610) Anesthesia: Local anesthesia (1-2% Lidocaine) Anxiolysis: None                 Sedation: None. DOS: 06/20/2021  Performed by: Gillis Santa, MD  Purpose: Diagnostic/Therapeutic Indications: Knee arthralgia associated to osteoarthritis of the knee 1. Bilateral primary osteoarthritis of knee   2. Chronic pain syndrome    NAS-11 score:   Pre-procedure: 4 /10   Post-procedure: 4 /10     Effectiveness:  Initial hour after procedure: 20 %  Subsequent 4-6 hours post-procedure: 0 %  Analgesia past initial 6 hours: 0 %    Pharmacotherapy  Assessment     Analgesic: Transition to oxycodone 5 mg every 6 hours as needed.  Monitoring: [place] PMP:  PDMP not reviewed this encounter.       Pharmacotherapy: No side-effects or adverse reactions reported. Compliance: No problems identified. Effectiveness: Clinically acceptable.  Sandra Martins, RN  07/19/2021  2:33 PM  Sign when Signing Visit Wasted Tramadol #1 tablet and Hydrocodone #26 tablets, witnessed by patient and Sandra Liv, RN.  Sandra Martins, RN  07/19/2021  2:02 PM  Sign when Signing Visit Nursing Pain Medication Assessment:  Safety precautions to be maintained throughout the outpatient stay will include: orient to surroundings, keep bed in low position, maintain call bell within reach at all times, provide assistance with transfer out of bed and ambulation.  Medication Inspection Compliance: Pill count conducted under aseptic conditions, in front of the patient. Neither the pills nor the bottle was removed from the patient's sight at any time. Once count was completed pills were immediately returned to the patient in their original bottle.  Medication #1: Hydrocodone/APAP Pill/Patch Count: 26 out of 120 Pill/Patch Appearance: Markings consistent with prescribed medication Bottle Appearance: Standard pharmacy container. Clearly labeled. Filled Date: 05 / 02 / 2023 Last Medication intake:  Today  Medication #2: Tramadol (Ultram) Pill/Patch Count:  1 of 30 pills remain Pill/Patch Appearance: Markings consistent with prescribed medication Bottle Appearance: Standard pharmacy container. Clearly labeled. Filled Date:  fill date not visible on bottle Last Medication intake:  Today Safety precautions to be maintained throughout the outpatient stay will include: orient to surroundings, keep bed in low position, maintain call bell within reach at all times, provide assistance with transfer out of bed and ambulation.      UDS:  Summary  Date Value Ref Range Status  03/22/2021 Note  Final    Comment:    ==================================================================== [facility]  Select 13 (MW) ==================================================================== Test                             Result       Flag       Units  Drug Present and Declared for Prescription Verification   Hydrocodone                    2013         EXPECTED   ng/mg creat   Hydromorphone                  814          EXPECTED   ng/mg creat   Dihydrocodeine                 505          EXPECTED   ng/mg creat   Norhydrocodone                 >2041        EXPECTED   ng/mg creat    Sources of hydrocodone include scheduled prescription medications.    Hydromorphone, dihydrocodeine and norhydrocodone are expected    metabolites of hydrocodone. Hydromorphone and dihydrocodeine are    also available as scheduled prescription medications.    Tramadol                       >2041        EXPECTED   ng/mg creat   O-Desmethyltramadol            >  2041        EXPECTED   ng/mg creat   N-Desmethyltramadol            839          EXPECTED   ng/mg creat    Source of tramadol is a prescription medication. O-desmethyltramadol    and N-desmethyltramadol are expected metabolites of tramadol.  ==================================================================== Test                      Result    Flag   Units      Ref Range   Creatinine              245              mg/dL      >=20 ==================================================================== Declared Medications:  The flagging and interpretation on this report are based on the  following declared medications.  Unexpected results may arise from  inaccuracies in the declared medications.   **Note: The testing scope of this panel includes these medications:   Hydrocodone (Norco)  Tramadol (Ultram)   **Note: The testing scope of this panel does not include the  following reported medications:   Acetaminophen (Tylenol)  Acetaminophen (Norco)  Amitriptyline (Elavil)  Amlodipine (Norvasc)  Aspirin  Cyanocobalamin  Cyclobenzaprine (Flexeril)   Esomeprazole (Nexium)  Famotidine (Pepcid)  Fluticasone (Flonase)  Gabapentin (Neurontin)  Hydrochlorothiazide  Levocetirizine (Xyzal)  Meclizine (Antivert)  Melatonin  Montelukast (Singulair)  Promethazine (Phenergan)  Quetiapine (Seroquel)  Simvastatin (Zocor)  Valsartan (Diovan) ==================================================================== For clinical consultation, please call [phone]. ====================================================================      ROS  Constitutional: Denies any fever or chills Gastrointestinal: No reported hemesis, hematochezia, vomiting, or acute GI distress Musculoskeletal:  b/l knee pain Neurological: No reported episodes of acute onset apraxia, aphasia, dysarthria, agnosia, amnesia, paralysis, loss of coordination, or loss of consciousness  Medication Review  HYDROcodone-acetaminophen, Magnesium, Melatonin, QUEtiapine, acetaminophen, albuterol, amLODipine, amitriptyline, aspirin, doxylamine (Sleep), esomeprazole, famotidine, fluticasone, gabapentin, hydrOXYzine, hydrochlorothiazide, levocetirizine, meclizine, methocarbamol, montelukast, oxyCODONE-acetaminophen, predniSONE, promethazine, simvastatin, traMADol, valsartan, and vitamin B-12  History Review  Allergy: Sandra [name] is allergic to trazodone, codeine, cortisone, oxycontin [oxycodone hcl], zolpidem, hydrocodone-acetaminophen, and oxycodone-acetaminophen. Drug: Sandra [name]  reports no history of drug use. Alcohol:  reports no history of alcohol use. Tobacco:  reports that she quit smoking about 47 years ago. Her smoking use included cigarettes. She has never used smokeless tobacco. Social: Sandra [name]  reports that she quit smoking about 47 years ago. Her smoking use included cigarettes. She has never used smokeless tobacco. She reports that she does not drink alcohol and does not use drugs. Medical:  has a past medical history of Anemia, Anxiety (08/01/2014), Arthritis,  degenerative (07/30/2013), Clinical depression (06/30/2014), Depression, Elevated liver enzymes, GERD (gastroesophageal reflux disease), H/O breast biopsy (01/05/2015), H/O: attempted suicide (2013), Hepatitis B, Hiatal hernia, History of hysterectomy (01/05/2015), History of peptic ulcer disease (01/05/2015), Hypercholesteremia, Hypertension, Legally blind, Panic attacks, PUD (peptic ulcer disease), Rectocele (01/05/2015), Retinitis pigmentosa, and S/P cholecystectomy (01/05/2015). Surgical: Sandra [name]  has a past surgical history that includes Cholecystectomy; Rectocele repair; Abdominal hysterectomy; Appendectomy; Colonoscopy; Esophagogastroduodenoscopy; Sphincterotomy (N/A, 01/24/2017); Hemorrhoid surgery (N/A, 03/07/2017); Hernia repair; Esophagogastroduodenoscopy (egd) with propofol (N/A, 09/17/2017); Oophorectomy; and Breast biopsy (Bilateral, 1977). Family: family history includes Bone cancer in her maternal uncle; Breast cancer in her sister; Breast cancer (age of onset: 57) in her mother; Cancer in her father; Dementia in her mother; Diabetes in her mother; Hypertension in her mother; Kidney cancer  in her sister; Leukemia in her paternal grandfather and paternal grandmother; Pancreatic cancer in her maternal aunt; Prostate cancer (age of onset: 67) in her father.  Laboratory Chemistry Profile   Renal Lab Results  Component Value Date   BUN 20 06/26/2021   CREATININE 0.77 06/26/2021   GFRAA >60 01/16/2017   GFRNONAA >60 06/26/2021     Hepatic Lab Results  Component Value Date   AST 32 06/26/2021   ALT 36 06/26/2021   ALBUMIN 4.1 06/26/2021   ALKPHOS 71 06/26/2021   HCVAB NON REACTIVE 06/26/2021   AMMONIA 31 09/28/2020     Electrolytes Lab Results  Component Value Date   NA 135 06/26/2021   K 3.7 06/26/2021   CL 98 06/26/2021   CALCIUM 9.5 06/26/2021   MG 2.0 09/27/2020     Bone Lab Results  Component Value Date   25OHVITD1 19 (L) 02/07/2015   25OHVITD2 3.9 02/07/2015    25OHVITD3 15 02/07/2015     Inflammation (CRP: Acute Phase) (ESR: Chronic Phase) Lab Results  Component Value Date   CRP 1.3 (H) 09/27/2020   ESRSEDRATE 26 02/07/2015   LATICACIDVEN 1.1 09/27/2020       Note: Above Lab results reviewed.  Recent Imaging Review  MM 3D SCREEN BREAST BILATERAL CLINICAL DATA:  Screening.  EXAM: DIGITAL SCREENING BILATERAL MAMMOGRAM WITH TOMOSYNTHESIS AND CAD  TECHNIQUE: Bilateral screening digital craniocaudal and mediolateral oblique mammograms were obtained. Bilateral screening digital breast tomosynthesis was performed. The images were evaluated with computer-aided detection.  COMPARISON:  Previous exam(s).  ACR Breast Density Category b: There are scattered areas of fibroglandular density.  FINDINGS: There are no findings suspicious for malignancy.  IMPRESSION: No mammographic evidence of malignancy. A result letter of this screening mammogram will be mailed directly to the patient.  RECOMMENDATION: Screening mammogram in one year. (Code:SM-B-01Y)  BI-RADS CATEGORY  1: Negative.  Electronically Signed   By: Lillia Mountain M.D.   On: 01/08/2021 08:27 Note: Reviewed        Physical Exam  General appearance: Well nourished, well developed, and well hydrated. In no apparent acute distress Mental status: Alert, oriented x 3 (person, place, & time)       Respiratory: No evidence of acute respiratory distress  Vitals: BP (!) 149/78   Pulse 86   Temp 97.7 F (36.5 C) (Temporal)   Resp 16   Ht 5' 1"  (1.549 m)   Wt 142 lb (64.4 kg)   SpO2 98%   BMI 26.83 kg/m  BMI: Estimated body mass index is 26.83 kg/m as calculated from the following:   Height as of this encounter: 5' 1"  (1.549 m).   Weight as of this encounter: 142 lb (64.4 kg). Ideal: Ideal body weight: 47.8 kg (105 lb 6.1 oz) Adjusted ideal body weight: 54.4 kg (120 lb 0.4 oz)   Lumbar Spine Area Exam  Skin & Axial Inspection: No masses, redness, or swelling Alignment:  Symmetrical Functional ROM: Pain restricted ROM       Stability: No instability detected Muscle Tone/Strength: Functionally intact. No obvious neuro-muscular anomalies detected. Sensory (Neurological): Musculoskeletal pain pattern  Gait & Posture Assessment  Ambulation: Patient came in today in a wheel chair Gait: Limited. Using assistive device to ambulate Posture: Difficulty standing up straight, due to pain  Lower Extremity Exam    Side: Right lower extremity  Side: Left lower extremity  Stability: No instability observed          Stability: No instability observed  Skin & Extremity Inspection: Pitting edema  Skin & Extremity Inspection: Pitting edema  Functional ROM: Pain restricted ROM for hip and knee joints          Functional ROM: Pain restricted ROM for hip and knee joints          Muscle Tone/Strength: Functionally intact. No obvious neuro-muscular anomalies detected.  Muscle Tone/Strength: Functionally intact. No obvious neuro-muscular anomalies detected.  Sensory (Neurological): Musculoskeletal pain pattern        Sensory (Neurological): Musculoskeletal pain pattern        DTR: Patellar: deferred today Achilles: deferred today Plantar: deferred today  DTR: Patellar: deferred today Achilles: deferred today Plantar: deferred today  Palpation: No palpable anomalies  Palpation: No palpable anomalies    Assessment   Status Diagnosis  Persistent Persistent Persistent 1. Bilateral primary osteoarthritis of knee   2. Lumbar facet syndrome (Bilateral) (R>L)   3. Lumbar facet arthropathy   4. [name] (CBS)   5. Chronic lower extremity pain (Bilateral)   6. Impaired visual perception   7. Localized osteoarthritis of right shoulder   8. Chronic radicular lumbar pain   9. Chronic pain syndrome       Plan of Care  Sandra [name] has a current medication list which includes the following long-term medication(s): albuterol, amitriptyline, sleep  aid, fluticasone, gabapentin, hydrochlorothiazide, levocetirizine, montelukast, promethazine, quetiapine, simvastatin, valsartan, and esomeprazole.  Pharmacotherapy (Medications Ordered): Meds ordered this encounter  Medications   oxyCODONE-acetaminophen (PERCOCET) 5-325 MG tablet    Sig: Take 1 tablet by mouth every 6 (six) hours as needed for severe pain. Must last 30 days.    Dispense:  120 tablet    Refill:  0    Chronic Pain: STOP Act (Not applicable) Fill 1 day early if closed on refill date. Avoid benzodiazepines within 8 hours of opioids   oxyCODONE-acetaminophen (PERCOCET) 5-325 MG tablet    Sig: Take 1 tablet by mouth every 6 (six) hours as needed for severe pain. Must last 30 days.    Dispense:  120 tablet    Refill:  0    Chronic Pain: STOP Act (Not applicable) Fill 1 day early if closed on refill date. Avoid benzodiazepines within 8 hours of opioids   Orders:  No orders of the defined types were placed in this encounter.  Follow-up plan:   Return in about 8 weeks (around 09/13/2021) for Medication Management, in person.     Status post bilateral intra-articular knee steroid, right elbow injection on 04/05/2019.  Status post L4-L5 ESI, right-sided along with right shoulder steroid injection on 06/16/2019, right shoulder injection on 07/28/2019, posterior approach         Recent Visits Date Type Provider Dept  06/20/21 Procedure visit Gillis Santa, MD [facility]  06/12/21 Office Visit Gillis Santa, MD [facility]  Showing recent visits within past 90 days and meeting all other requirements Today's Visits Date Type Provider Dept  07/19/21 Office Visit Gillis Santa, MD [facility]  Showing today's visits and meeting all other requirements Future Appointments Date Type Provider Dept  09/04/21 Appointment Gillis Santa, MD [facility]  Showing future appointments within next 90 days and meeting all other requirements  I discussed the  assessment and treatment plan with the patient. The patient was provided an opportunity to ask questions and all were answered. The patient agreed with the plan and demonstrated an understanding of the instructions.  Patient advised to call back or seek an  in-person evaluation if the symptoms or condition worsens.  Duration of encounter: 30 minutes.  Note by: Gillis Santa, MD Date: 07/19/2021; Time: 3:07 PM

## 2021-07-19 NOTE — Progress Notes (Signed)
Nursing Pain Medication Assessment:  Safety precautions to be maintained throughout the outpatient stay will include: orient to surroundings, keep bed in low position, maintain call bell within reach at all times, provide assistance with transfer out of bed and ambulation.  Medication Inspection Compliance: Pill count conducted under aseptic conditions, in front of the patient. Neither the pills nor the bottle was removed from the patient's sight at any time. Once count was completed pills were immediately returned to the patient in their original bottle.  Medication #1: Hydrocodone/APAP Pill/Patch Count: 26 out of 120 Pill/Patch Appearance: Markings consistent with prescribed medication Bottle Appearance: Standard pharmacy container. Clearly labeled. Filled Date: 05 / 02 / 2023 Last Medication intake:  Today  Medication #2: Tramadol (Ultram) Pill/Patch Count:  1 of 30 pills remain Pill/Patch Appearance: Markings consistent with prescribed medication Bottle Appearance: Standard pharmacy container. Clearly labeled. Filled Date:  fill date not visible on bottle Last Medication intake:  Today Safety precautions to be maintained throughout the outpatient stay will include: orient to surroundings, keep bed in low position, maintain call bell within reach at all times, provide assistance with transfer out of bed and ambulation.

## 2021-07-19 NOTE — Progress Notes (Signed)
Wasted Tramadol #1 tablet and Hydrocodone #26 tablets, witnessed by patient and Louann Liv, RN.

## 2021-07-20 ENCOUNTER — Other Ambulatory Visit: Payer: Self-pay

## 2021-07-20 ENCOUNTER — Telehealth: Payer: Self-pay | Admitting: Student in an Organized Health Care Education/Training Program

## 2021-07-20 NOTE — Telephone Encounter (Signed)
Patient is asking about a refill on her muscle relaxer? Was it sent in from appt yesterday?

## 2021-07-23 ENCOUNTER — Other Ambulatory Visit: Payer: Self-pay

## 2021-07-23 ENCOUNTER — Telehealth: Payer: Self-pay | Admitting: Student in an Organized Health Care Education/Training Program

## 2021-07-23 MED ORDER — METHOCARBAMOL 500 MG PO TABS: 500.0000 mg | ORAL_TABLET | Freq: Three times a day (TID) | ORAL | 2 refills | Status: DC | PRN

## 2021-07-23 NOTE — Telephone Encounter (Signed)
Called patient and she states that she needs presription for Robaxin.  Sent to Dr Jackelyn Poling

## 2021-07-23 NOTE — Telephone Encounter (Signed)
Patient wants meds send to [facility] in [place]. Please give patient a call. Thanks

## 2021-07-24 ENCOUNTER — Encounter: Payer: Self-pay | Admitting: Oncology

## 2021-07-24 ENCOUNTER — Inpatient Hospital Stay: Payer: Medicare PPO | Attending: Oncology | Admitting: Oncology

## 2021-07-24 VITALS — BP 162/66 | HR 83 | Temp 97.3°F | Ht 61.0 in | Wt 144.0 lb

## 2021-07-24 DIAGNOSIS — Z803 Family history of malignant neoplasm of breast: Secondary | ICD-10-CM | POA: Diagnosis not present

## 2021-07-24 DIAGNOSIS — H548 Legal blindness, as defined in USA: Secondary | ICD-10-CM | POA: Insufficient documentation

## 2021-07-24 DIAGNOSIS — M25569 Pain in unspecified knee: Secondary | ICD-10-CM | POA: Diagnosis not present

## 2021-07-24 DIAGNOSIS — D7589 Other specified diseases of blood and blood-forming organs: Secondary | ICD-10-CM | POA: Insufficient documentation

## 2021-07-24 DIAGNOSIS — Z806 Family history of leukemia: Secondary | ICD-10-CM | POA: Diagnosis not present

## 2021-07-24 DIAGNOSIS — Z8051 Family history of malignant neoplasm of kidney: Secondary | ICD-10-CM | POA: Insufficient documentation

## 2021-07-24 DIAGNOSIS — Z800 Family history of malignant neoplasm of digestive organs: Secondary | ICD-10-CM | POA: Diagnosis not present

## 2021-07-24 DIAGNOSIS — D72820 Lymphocytosis (symptomatic): Secondary | ICD-10-CM | POA: Diagnosis present

## 2021-07-24 DIAGNOSIS — Z87891 Personal history of nicotine dependence: Secondary | ICD-10-CM | POA: Diagnosis not present

## 2021-07-24 NOTE — Progress Notes (Signed)
Hematology/Oncology Progress note [phone] [phone]            Patient Care Team: Baxter Hire, MD as PCP - General (Internal Medicine)  REFERRING PROVIDER: Baxter Hire, MD  CHIEF COMPLAINTS/REASON FOR VISIT:  lymphocytosis.   HISTORY OF PRESENTING ILLNESS:   Sandra [name] is a  66 y.o.  female with PMH listed below was seen in consultation at the request of  Baxter Hire, MD  for evaluation of lymphocytosis .  Patient is legally blind.  06/08/2021 cbc showed wbc 11.7. normal hemoglobin, platelet counts.  ALC is increased at 6.09. decreased eosinophil.  07/11/2020 CBC showed wbc 14.2, increased ANC 10.26. decreased eosinophil.   Denies weight loss, fever, chills,night sweats.  Some fatigue  INTERVAL HISTORY Sandra [name] is a 66 y.o. female who has above history reviewed by me today presents for follow up visit for macrocytosis. Patient presents to discuss results. She complains of lower extremity pain/knee pain.  Some sweating at night.  Weight has been stable.  She has gained 2 pounds since last visit.  Review of Systems  Constitutional:  Positive for fatigue. Negative for appetite change, chills and fever.  HENT:   Negative for hearing loss and voice change.   Eyes:        Legally blind  Respiratory:  Negative for chest tightness and cough.   Cardiovascular:  Negative for chest pain.  Gastrointestinal:  Negative for abdominal distention, abdominal pain and blood in stool.  Endocrine: Negative for hot flashes.  Genitourinary:  Negative for difficulty urinating and frequency.   Musculoskeletal:  Negative for arthralgias.  Skin:  Negative for itching and rash.  Neurological:  Negative for extremity weakness.  Hematological:  Negative for adenopathy.  Psychiatric/Behavioral:  Negative for confusion.    MEDICAL HISTORY:  Past Medical History:  Diagnosis Date   Anemia    Anxiety 08/01/2014   Arthritis, degenerative  07/30/2013   Clinical depression 06/30/2014   Depression    Elevated liver enzymes    GERD (gastroesophageal reflux disease)    H/O breast biopsy 01/05/2015   H/O: attempted suicide 2013   Hepatitis B    Hiatal hernia    History of hysterectomy 01/05/2015   History of peptic ulcer disease 01/05/2015   Hypercholesteremia    Hypertension    Legally blind    Panic attacks    PUD (peptic ulcer disease)    Rectocele 01/05/2015   Retinitis pigmentosa    S/P cholecystectomy 01/05/2015    SURGICAL HISTORY: Past Surgical History:  Procedure Laterality Date   ABDOMINAL HYSTERECTOMY     APPENDECTOMY     BREAST BIOPSY Bilateral 1977   neg   CHOLECYSTECTOMY     COLONOSCOPY     ESOPHAGOGASTRODUODENOSCOPY     ESOPHAGOGASTRODUODENOSCOPY (EGD) WITH PROPOFOL N/A 09/17/2017   Procedure: ESOPHAGOGASTRODUODENOSCOPY (EGD) WITH PROPOFOL;  Surgeon: Toledo, Benay Pike, MD;  Location: [facility];  Service: Gastroenterology;  Laterality: N/A;   HEMORRHOID SURGERY N/A 03/07/2017   Procedure: HEMORRHOIDECTOMY;  Surgeon: Leonie Green, MD;  Location: [facility];  Service: General;  Laterality: N/A;   HERNIA REPAIR     OOPHORECTOMY     RECTOCELE REPAIR     SPHINCTEROTOMY N/A 01/24/2017   Procedure: SPHINCTEROTOMY;  Surgeon: Leonie Green, MD;  Location: [facility];  Service: General;  Laterality: N/A;    SOCIAL HISTORY: Social History   Socioeconomic History   Marital status: Widowed    Spouse name: Not on file  Number of children: Not on file   Years of education: Not on file   Highest education level: Not on file  Occupational History   Not on file  Tobacco Use   Smoking status: Former    Types: Cigarettes    Quit date: 08/30/1973    Years since quitting: 47.9   Smokeless tobacco: Never  Vaping Use   Vaping Use: Never used  Substance and Sexual Activity   Alcohol use: No    Alcohol/week: 0.0 standard drinks   Drug use: No   Sexual activity: Not on file  Other Topics  Concern   Not on file  Social History Narrative   Not on file   Social Determinants of Health   Financial Resource Strain: Not on file  Food Insecurity: Not on file  Transportation Needs: Not on file  Physical Activity: Not on file  Stress: Not on file  Social Connections: Not on file  Intimate Partner Violence: Not on file    FAMILY HISTORY: Family History  Problem Relation Age of Onset   Breast cancer Mother 52   Hypertension Mother    Diabetes Mother    Dementia Mother    Cancer Father    Prostate cancer Father 17   Breast cancer Sister        dx 11s   Kidney cancer Sister        dx 32s   Pancreatic cancer Maternal Aunt    Bone cancer Maternal Uncle    Leukemia Paternal Grandmother    Leukemia Paternal Grandfather     ALLERGIES:  is allergic to trazodone, codeine, cortisone, oxycontin [oxycodone hcl], zolpidem, hydrocodone-acetaminophen, and oxycodone-acetaminophen.  MEDICATIONS:  Current Outpatient Medications  Medication Sig Dispense Refill   acetaminophen (TYLENOL) 325 MG tablet Take 650 mg by mouth every 6 (six) hours as needed.     albuterol (VENTOLIN HFA) 108 (90 Base) MCG/ACT inhaler Inhale 2 puffs into the lungs every 6 (six) hours as needed for wheezing or shortness of breath.     amitriptyline (ELAVIL) 25 MG tablet Take 25 mg by mouth at bedtime.     amLODipine (NORVASC) 5 MG tablet Take 5 mg by mouth daily.     aspirin 81 MG tablet Take 81 mg by mouth daily.     doxylamine, Sleep, (SLEEP AID) 25 MG tablet Take 25 mg by mouth at bedtime as needed.     famotidine (PEPCID) 40 MG tablet Take 1 tablet by mouth at bedtime.     fluticasone (FLONASE) 50 MCG/ACT nasal spray Place 2 sprays into both nostrils daily.     gabapentin (NEURONTIN) 400 MG capsule Take 1 capsule (400 mg total) by mouth 3 (three) times daily. 90 capsule 5   hydrochlorothiazide (HYDRODIURIL) 12.5 MG tablet TAKE ONE TABLET BY MOUTH ONCE DAILY AS NEEDED FOR SWELLING OF FEET     [START ON  08/21/2021] HYDROcodone-acetaminophen (NORCO) 5-325 MG tablet Take 1 tablet by mouth every 6 (six) hours as needed for moderate pain. 120 tablet 0   HYDROcodone-acetaminophen (NORCO) 5-325 MG tablet Take 1 tablet by mouth every 6 (six) hours as needed for moderate pain. 120 tablet 0   HYDROcodone-acetaminophen (NORCO) 5-325 MG tablet Take 1 tablet by mouth every 6 (six) hours as needed for moderate pain. 120 tablet 0   hydrOXYzine (ATARAX) 25 MG tablet Take 1 tablet by mouth 3 (three) times daily as needed.     levocetirizine (XYZAL) 5 MG tablet Take 5 mg by mouth every evening.  Magnesium 500 MG CAPS Take 1 capsule (500 mg total) by mouth at bedtime. 180 capsule 0   meclizine (ANTIVERT) 25 MG tablet Take 1 tablet (25 mg total) by mouth 3 (three) times daily as needed for dizziness. 30 tablet 1   Melatonin 10 MG TABS Take 2 tablets by mouth as needed (TAKES WITH LUNESTA IF THE LUNESTA DOES NOT HELP).     methocarbamol (ROBAXIN) 500 MG tablet Take 1 tablet (500 mg total) by mouth every 8 (eight) hours as needed for muscle spasms. 90 tablet 2   montelukast (SINGULAIR) 10 MG tablet Take 10 mg by mouth at bedtime.     oxyCODONE-acetaminophen (PERCOCET) 5-325 MG tablet Take 1 tablet by mouth every 6 (six) hours as needed for severe pain. Must last 30 days. 120 tablet 0   [START ON 08/18/2021] oxyCODONE-acetaminophen (PERCOCET) 5-325 MG tablet Take 1 tablet by mouth every 6 (six) hours as needed for severe pain. Must last 30 days. 120 tablet 0   promethazine (PHENERGAN) 25 MG tablet Take 25 mg by mouth every 6 (six) hours as needed for nausea.     QUEtiapine (SEROQUEL) 50 MG tablet Take 50 mg by mouth at bedtime.     simvastatin (ZOCOR) 20 MG tablet Take 20 mg by mouth at bedtime.      valsartan (DIOVAN) 160 MG tablet Take 160 mg by mouth daily.     vitamin B-12 (CYANOCOBALAMIN) 500 MCG tablet Take 500 mcg by mouth daily.     esomeprazole (NEXIUM) 40 MG capsule Take by mouth.     predniSONE (DELTASONE) 10  MG tablet Take 1 tablet (10 mg total) by mouth daily. 6,5,4,3,2,1 six day taper (Patient not taking: Reported on 06/12/2021) 21 tablet 0   No current facility-administered medications for this visit.     PHYSICAL EXAMINATION: ECOG PERFORMANCE STATUS: 1 - Symptomatic but completely ambulatory Vitals:   07/24/21 1356  BP: (!) 162/66  Pulse: 83  Temp: (!) 97.3 F (36.3 C)  SpO2: 100%   Filed Weights   07/24/21 1356  Weight: 144 lb (65.3 kg)    Physical Exam Constitutional:      General: She is not in acute distress. HENT:     Head: Normocephalic and atraumatic.  Eyes:     General: No scleral icterus. Cardiovascular:     Rate and Rhythm: Normal rate and regular rhythm.     Heart sounds: Normal heart sounds.  Pulmonary:     Effort: Pulmonary effort is normal. No respiratory distress.     Breath sounds: No wheezing.  Abdominal:     General: Bowel sounds are normal. There is no distension.     Palpations: Abdomen is soft.  Musculoskeletal:        General: No deformity. Normal range of motion.     Cervical back: Normal range of motion and neck supple.  Skin:    General: Skin is warm and dry.     Findings: No erythema or rash.  Neurological:     Mental Status: She is alert and oriented to person, place, and time. Mental status is at baseline.  Psychiatric:        Mood and Affect: Mood normal.    LABORATORY DATA:  I have reviewed the data as listed Lab Results  Component Value Date   WBC 8.9 06/26/2021   HGB 13.3 06/26/2021   HCT 41.6 06/26/2021   MCV 91.0 06/26/2021   PLT 400 06/26/2021   Recent Labs    09/29/20 9381  09/30/20 0600 06/26/21 1540  NA 140 140 135  K 3.7 3.3* 3.7  CL 109 106 98  CO2 27 25 28   GLUCOSE 92 98 125*  BUN 10 11 20   CREATININE 0.62 0.59 0.77  CALCIUM 8.2* 9.0 9.5  GFRNONAA >60 >60 >60  PROT 6.2* 6.7 7.4  ALBUMIN 3.7 3.8 4.1  AST 76* 46* 32  ALT 79* 61* 36  ALKPHOS 94 91 71  BILITOT 0.5 0.6 0.6    Iron/TIBC/Ferritin/ %Sat     Component Value Date/Time   FERRITIN 48 09/27/2020 2344       RADIOGRAPHIC STUDIES: I have personally reviewed the radiological images as listed and agreed with the findings in the report. No results found.    ASSESSMENT & PLAN:  1. Lymphocytosis    Lymphocytosis, reactive vs underlying bone marrow malignancy Labs reviewed and discussed with patient. LDH was not elevated, negative hepatitis panel, negative HIV, ANA is negative, peripheral blood flow cytometry showed no diagnostic immunophenotypic abnormality.  20% of the T cells shows loss of CD7 expression which is nonspecific, could be seen in reactive and neoplastic process.  Multiple myeloma panel showed negative M protein, normal light chain ratio. Her CBC showed no leukocytosis or lymphocytosis.  Hemoglobin was normal.  I recommend observation and repeat lab work in 6 months.   Orders Placed This Encounter  Procedures   CBC with Differential/Platelet    Standing Status:   Future    Standing Expiration Date:   07/25/2022   Comprehensive metabolic panel    Standing Status:   Future    Standing Expiration Date:   07/24/2022   Lactate dehydrogenase    Standing Status:   Future    Standing Expiration Date:   07/25/2022   Flow cytometry panel-leukemia/lymphoma work-up    Standing Status:   Future    Standing Expiration Date:   07/25/2022   Miscellaneous [facility] test (send-out)    Standing Status:   Future    Standing Expiration Date:   07/25/2022    Order Specific Question:   Test name / description:    Answer:   T cell receptor gene rearrangement [facility] [id number]    All questions were answered. The patient knows to call the clinic with any problems questions or concerns.  cc Baxter Hire, MD    Return of visit:  6 months    Earlie Server, MD, PhD [facility] Hematology Oncology 07/24/2021

## 2021-07-30 ENCOUNTER — Telehealth: Payer: Self-pay | Admitting: Licensed Clinical Social Worker

## 2021-07-30 ENCOUNTER — Telehealth: Payer: Self-pay | Admitting: Student in an Organized Health Care Education/Training Program

## 2021-07-30 NOTE — Telephone Encounter (Signed)
Patient called stating '5mg'$  med is not enough wants to know if she can up the dosage. Please call

## 2021-07-30 NOTE — Telephone Encounter (Signed)
Informed her that no changes in meds will be done. I told her that an increase in dose is not always the best option for control of chronic pain.

## 2021-08-06 ENCOUNTER — Encounter: Payer: Self-pay | Admitting: Licensed Clinical Social Worker

## 2021-08-06 NOTE — Telephone Encounter (Signed)
Attempted to reach Sandra [name] x3 with genetic test results, no answer. Will send patient a letter requesting she call us to review test results together.

## 2021-08-20 IMAGING — CR DG LUMBAR SPINE 2-3V
3 series · 3 of 3 positions shown · non-contrast
Comparison: MRI lumbar spine from 12/15/2013

CLINICAL DATA: Fall. Back pain.

EXAM:
LUMBAR SPINE - 2-3 VIEW

[l-spine ap]
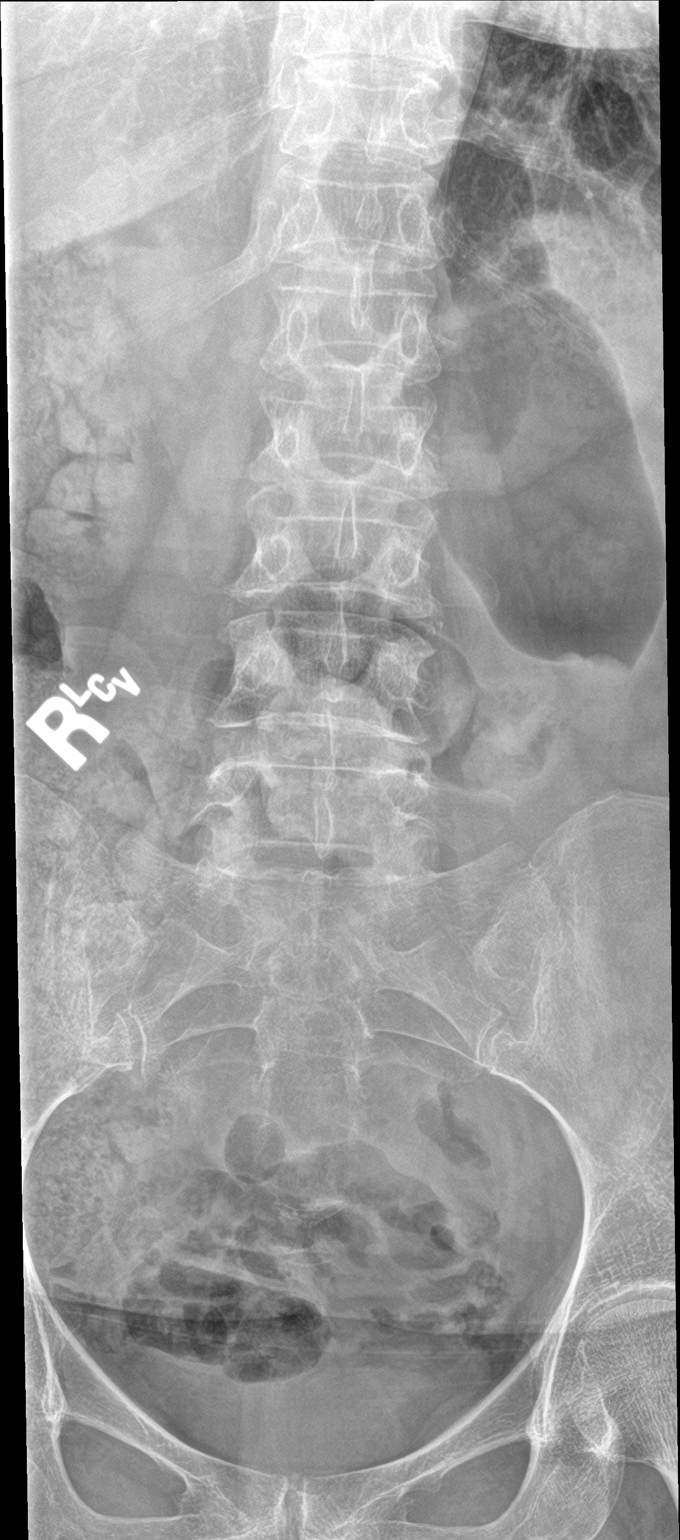

[l-spine lat]
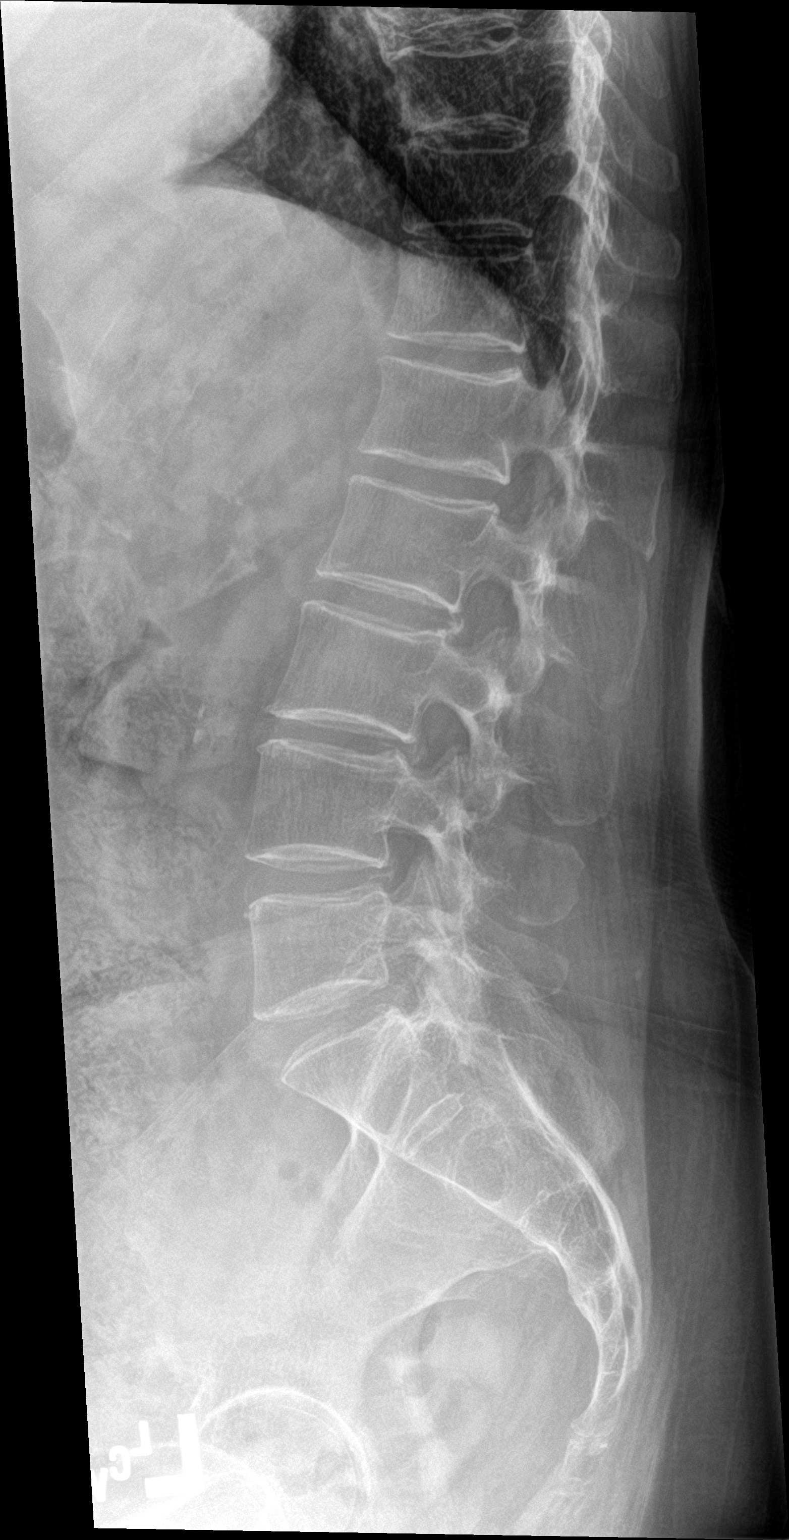

[l-spine spot]
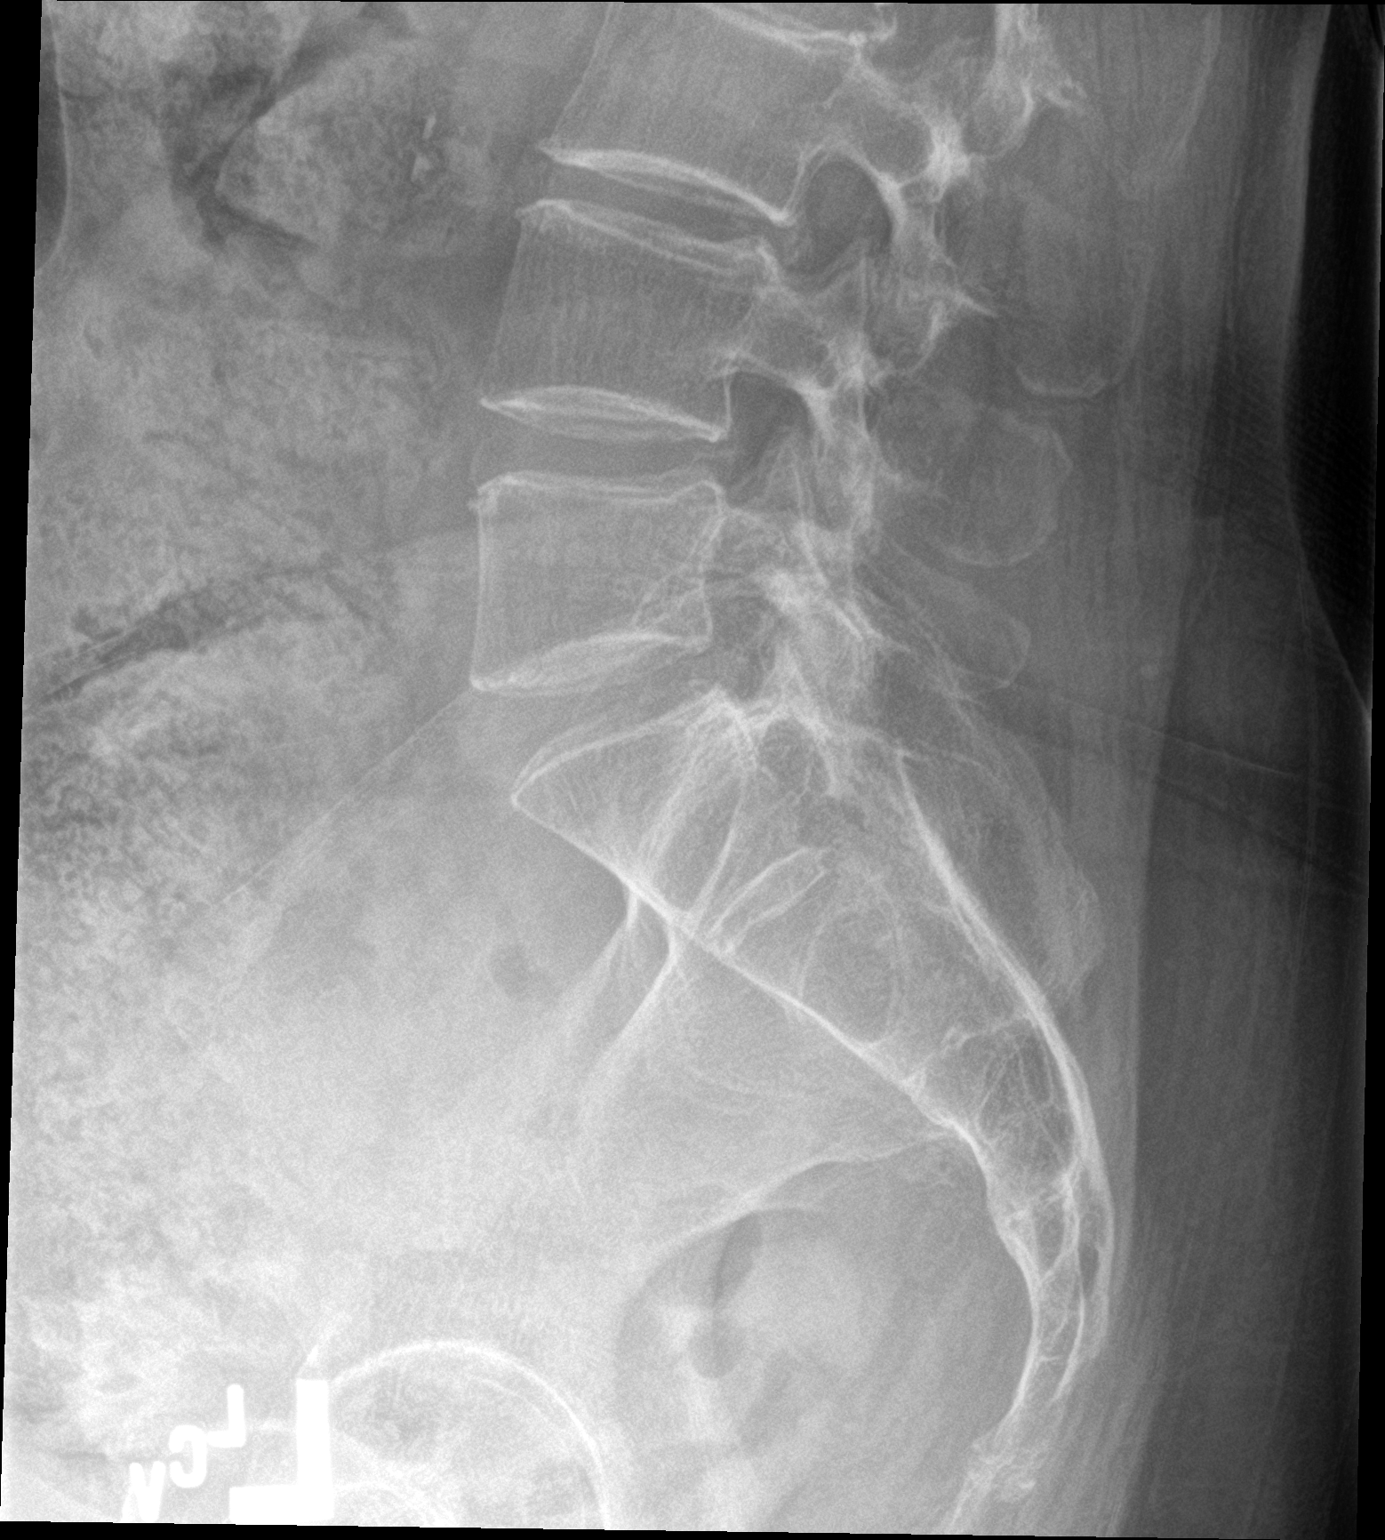

[3 of 3 positions shown; findings below may reference images not displayed]

FINDINGS: Bony demineralization. No lumbar spine fracture or acute subluxation
is identified. Preserved intervertebral disc height.
IMPRESSION: Bony demineralization. No fracture or acute subluxation identified.

## 2021-08-29 ENCOUNTER — Telehealth: Payer: Self-pay | Admitting: Licensed Clinical Social Worker

## 2021-08-29 NOTE — Telephone Encounter (Signed)
Received voicemail from Ms. Borman that she would like to review genetic test results. Left voicemail for her that she can call back to review these at any time.

## 2021-09-04 ENCOUNTER — Ambulatory Visit
Payer: Medicare PPO | Attending: Student in an Organized Health Care Education/Training Program | Admitting: Student in an Organized Health Care Education/Training Program

## 2021-09-04 ENCOUNTER — Encounter: Payer: Self-pay | Admitting: Student in an Organized Health Care Education/Training Program

## 2021-09-04 VITALS — BP 143/69 | HR 83 | Temp 97.3°F | Resp 14 | Ht 61.0 in | Wt 144.0 lb

## 2021-09-04 DIAGNOSIS — M170 Bilateral primary osteoarthritis of knee: Secondary | ICD-10-CM | POA: Diagnosis present

## 2021-09-04 DIAGNOSIS — G894 Chronic pain syndrome: Secondary | ICD-10-CM | POA: Diagnosis present

## 2021-09-04 DIAGNOSIS — H5316 Psychophysical visual disturbances: Secondary | ICD-10-CM | POA: Insufficient documentation

## 2021-09-04 DIAGNOSIS — G8929 Other chronic pain: Secondary | ICD-10-CM | POA: Insufficient documentation

## 2021-09-04 DIAGNOSIS — M5416 Radiculopathy, lumbar region: Secondary | ICD-10-CM | POA: Insufficient documentation

## 2021-09-04 DIAGNOSIS — M79605 Pain in left leg: Secondary | ICD-10-CM | POA: Insufficient documentation

## 2021-09-04 DIAGNOSIS — M79604 Pain in right leg: Secondary | ICD-10-CM | POA: Insufficient documentation

## 2021-09-04 DIAGNOSIS — M19011 Primary osteoarthritis, right shoulder: Secondary | ICD-10-CM | POA: Insufficient documentation

## 2021-09-04 DIAGNOSIS — M47816 Spondylosis without myelopathy or radiculopathy, lumbar region: Secondary | ICD-10-CM | POA: Insufficient documentation

## 2021-09-04 DIAGNOSIS — H539 Unspecified visual disturbance: Secondary | ICD-10-CM | POA: Insufficient documentation

## 2021-09-04 MED ORDER — METHOCARBAMOL 500 MG PO TABS: 500.0000 mg | ORAL_TABLET | Freq: Three times a day (TID) | ORAL | 2 refills | Status: DC | PRN

## 2021-09-04 MED ORDER — GABAPENTIN 600 MG PO TABS: 600.0000 mg | ORAL_TABLET | Freq: Three times a day (TID) | ORAL | 2 refills | Status: DC

## 2021-09-04 MED ORDER — OXYCODONE-ACETAMINOPHEN 5-325 MG PO TABS: 1.0000 | ORAL_TABLET | Freq: Four times a day (QID) | ORAL | 0 refills | Status: DC | PRN

## 2021-09-04 MED ORDER — OXYCODONE-ACETAMINOPHEN 5-325 MG PO TABS: 1.0000 | ORAL_TABLET | Freq: Four times a day (QID) | ORAL | 0 refills | Status: AC | PRN

## 2021-09-04 NOTE — Progress Notes (Signed)
PROVIDER NOTE: Information contained herein reflects review and annotations entered in association with encounter. Interpretation of such information and data should be left to medically-trained personnel. Information provided to patient can be located elsewhere in the medical record under "Patient Instructions". Document created using STT-dictation technology, any transcriptional errors that may result from process are unintentional.    Patient: Sandra [name]  Service Category: E/M  Provider: Gillis Santa, MD  DOB: Jul 22, 1955  DOS: 09/04/2021  Specialty: Interventional Pain Management  MRN: [id number]  Setting: Ambulatory outpatient  PCP: Baxter Hire, MD  Type: Established Patient    Referring Provider: Baxter Hire, MD  Location: Office  Delivery: Face-to-face     HPI  Sandra [name], a 66 y.o. year old female, is here today because of her Bilateral primary osteoarthritis of knee [M17.0]. Sandra [name]'s primary complain today is Back Pain (lower) Last encounter: My last encounter with her was on 07/19/21 Pertinent problems: Sandra [name] has Long term current use of opiate analgesic; Long term prescription opiate use; Opiate use; Hallucinations, visual; Psychiatric disorder; Chronic low back pain (Location of Primary Source of Pain) (Bilateral) (R>L); Chronic bilateral knee pain; Opiate dependence ([place]); Lumbar facet arthropathy; Lumbar spondylosis; Lumbar facet syndrome (Bilateral) (R>L); Diffuse myofascial pain syndrome; [name] (CBS); Arthropathy of right elbow; and Localized osteoarthritis of right shoulder on their pertinent problem list. Pain Assessment: Severity of Chronic pain is reported as a 6 /10. Location: Back Lower/sacral area. Onset: More than a month ago. Quality: Other (Comment) (strong, aggravating). Timing: Constant. Modifying factor(s): Tramadol, Ibuprofen, Tylenol. Vitals:  height is $RemoveB'5\' 1"'CwQJPCTT$  (1.549 m) and weight is 144 lb (65.3 kg). Her temporal temperature  is 97.3 F (36.3 C) (abnormal). Her blood pressure is 143/69 (abnormal) and her pulse is 83. Her respiration is 14 and oxygen saturation is 99%.   Reason for encounter: medication management.   [name] is still getting good benefit from her bilateral knee steroid injections from 06/20/2021.  She is having increased lower back and sacral pain.  She has tried heat and stretching exercises but they have not helped.  She is not having any side effects with gabapentin, we discussed increasing her dose to 600 mg 3 times a day.  Risk and benefits reviewed and patient would like to trial this.  I have advised against any opioid escalation which Sandra [name] also inquired about.  Pharmacotherapy Assessment     Analgesic:  oxycodone 5 mg every 6 hours as needed.  Monitoring: [place] PMP: PDMP not reviewed this encounter.       Pharmacotherapy: No side-effects or adverse reactions reported. Compliance: No problems identified. Effectiveness: Clinically acceptable.  Landis Martins, RN  09/04/2021  1:03 PM  Sign when Signing Visit Nursing Pain Medication Assessment:  Safety precautions to be maintained throughout the outpatient stay will include: orient to surroundings, keep bed in low position, maintain call bell within reach at all times, provide assistance with transfer out of bed and ambulation.  Medication Inspection Compliance: Pill count conducted under aseptic conditions, in front of the patient. Neither the pills nor the bottle was removed from the patient's sight at any time. Once count was completed pills were immediately returned to the patient in their original bottle.  Medication: Oxycodone/APAP Pill/Patch Count:  52 of 120 pills remain Pill/Patch Appearance: Markings consistent with prescribed medication Bottle Appearance: Standard pharmacy container. Clearly labeled. Filled Date: 06 / 27 / 2023 Last Medication intake:  Today Safety precautions to be maintained throughout the outpatient stay  will  include: orient to surroundings, keep bed in low position, maintain call bell within reach at all times, provide assistance with transfer out of bed and ambulation.    UDS:  Summary  Date Value Ref Range Status  03/22/2021 Note  Final    Comment:    ==================================================================== [facility] (MW) ==================================================================== Test                             Result       Flag       Units  Drug Present and Declared for Prescription Verification   Hydrocodone                    2013         EXPECTED   ng/mg creat   Hydromorphone                  814          EXPECTED   ng/mg creat   Dihydrocodeine                 505          EXPECTED   ng/mg creat   Norhydrocodone                 >2041        EXPECTED   ng/mg creat    Sources of hydrocodone include scheduled prescription medications.    Hydromorphone, dihydrocodeine and norhydrocodone are expected    metabolites of hydrocodone. Hydromorphone and dihydrocodeine are    also available as scheduled prescription medications.    Tramadol                       >2041        EXPECTED   ng/mg creat   O-Desmethyltramadol            >2041        EXPECTED   ng/mg creat   N-Desmethyltramadol            839          EXPECTED   ng/mg creat    Source of tramadol is a prescription medication. O-desmethyltramadol    and N-desmethyltramadol are expected metabolites of tramadol.  ==================================================================== Test                      Result    Flag   Units      Ref Range   Creatinine              245              mg/dL      >=20 ==================================================================== Declared Medications:  The flagging and interpretation on this report are based on the  following declared medications.  Unexpected results may arise from  inaccuracies in the declared medications.   **Note: The testing scope of this  panel includes these medications:   Hydrocodone (Norco)  Tramadol (Ultram)   **Note: The testing scope of this panel does not include the  following reported medications:   Acetaminophen (Tylenol)  Acetaminophen (Norco)  Amitriptyline (Elavil)  Amlodipine (Norvasc)  Aspirin  Cyanocobalamin  Cyclobenzaprine (Flexeril)  Esomeprazole (Nexium)  Famotidine (Pepcid)  Fluticasone (Flonase)  Gabapentin (Neurontin)  Hydrochlorothiazide  Levocetirizine (Xyzal)  Meclizine (Antivert)  Melatonin  Montelukast (Singulair)  Promethazine (Phenergan)  Quetiapine (Seroquel)  Simvastatin (Zocor)  Valsartan (Diovan) ==================================================================== For clinical consultation, please call [phone]. ====================================================================      ROS  Constitutional: Denies any fever or chills Gastrointestinal: No reported hemesis, hematochezia, vomiting, or acute GI distress Musculoskeletal:  Low back pain Neurological: No reported episodes of acute onset apraxia, aphasia, dysarthria, agnosia, amnesia, paralysis, loss of coordination, or loss of consciousness  Medication Review  HYDROcodone-acetaminophen, Magnesium, Melatonin, QUEtiapine, acetaminophen, albuterol, amLODipine, amitriptyline, aspirin, doxylamine (Sleep), esomeprazole, famotidine, fluticasone, gabapentin, hydrOXYzine, hydrochlorothiazide, levocetirizine, meclizine, methocarbamol, montelukast, oxyCODONE-acetaminophen, predniSONE, promethazine, simvastatin, valsartan, and vitamin B-12  History Review  Allergy: Sandra [name] is allergic to trazodone, codeine, cortisone, zolpidem, and hydrocodone-acetaminophen. Drug: Sandra [name]  reports no history of drug use. Alcohol:  reports no history of alcohol use. Tobacco:  reports that she quit smoking about 48 years ago. Her smoking use included cigarettes. She has never used smokeless tobacco. Social: Sandra [name]   reports that she quit smoking about 48 years ago. Her smoking use included cigarettes. She has never used smokeless tobacco. She reports that she does not drink alcohol and does not use drugs. Medical:  has a past medical history of Anemia, Anxiety (08/01/2014), Arthritis, degenerative (07/30/2013), Clinical depression (06/30/2014), Depression, Elevated liver enzymes, GERD (gastroesophageal reflux disease), H/O breast biopsy (01/05/2015), H/O: attempted suicide (2013), Hepatitis B, Hiatal hernia, History of hysterectomy (01/05/2015), History of peptic ulcer disease (01/05/2015), Hypercholesteremia, Hypertension, Legally blind, Panic attacks, PUD (peptic ulcer disease), Rectocele (01/05/2015), Retinitis pigmentosa, and S/P cholecystectomy (01/05/2015). Surgical: Sandra [name]  has a past surgical history that includes Cholecystectomy; Rectocele repair; Abdominal hysterectomy; Appendectomy; Colonoscopy; Esophagogastroduodenoscopy; Sphincterotomy (N/A, 01/24/2017); Hemorrhoid surgery (N/A, 03/07/2017); Hernia repair; Esophagogastroduodenoscopy (egd) with propofol (N/A, 09/17/2017); Oophorectomy; and Breast biopsy (Bilateral, 1977). Family: family history includes Bone cancer in her maternal uncle; Breast cancer in her sister; Breast cancer (age of onset: 75) in her mother; Cancer in her father; Dementia in her mother; Diabetes in her mother; Hypertension in her mother; Kidney cancer in her sister; Leukemia in her paternal grandfather and paternal grandmother; Pancreatic cancer in her maternal aunt; Prostate cancer (age of onset: 3) in her father.  Laboratory Chemistry Profile   Renal Lab Results  Component Value Date   BUN 20 06/26/2021   CREATININE 0.77 06/26/2021   GFRAA >60 01/16/2017   GFRNONAA >60 06/26/2021     Hepatic Lab Results  Component Value Date   AST 32 06/26/2021   ALT 36 06/26/2021   ALBUMIN 4.1 06/26/2021   ALKPHOS 71 06/26/2021   HCVAB NON REACTIVE 06/26/2021   AMMONIA 31 09/28/2020      Electrolytes Lab Results  Component Value Date   NA 135 06/26/2021   K 3.7 06/26/2021   CL 98 06/26/2021   CALCIUM 9.5 06/26/2021   MG 2.0 09/27/2020     Bone Lab Results  Component Value Date   25OHVITD1 19 (L) 02/07/2015   25OHVITD2 3.9 02/07/2015   25OHVITD3 15 02/07/2015     Inflammation (CRP: Acute Phase) (ESR: Chronic Phase) Lab Results  Component Value Date   CRP 1.3 (H) 09/27/2020   ESRSEDRATE 26 02/07/2015   LATICACIDVEN 1.1 09/27/2020       Note: Above Lab results reviewed.  Recent Imaging Review  MM 3D SCREEN BREAST BILATERAL CLINICAL DATA:  Screening.  EXAM: DIGITAL SCREENING BILATERAL MAMMOGRAM WITH TOMOSYNTHESIS AND CAD  TECHNIQUE: Bilateral screening digital craniocaudal and mediolateral oblique mammograms were obtained. Bilateral screening digital breast tomosynthesis was performed. The images were evaluated with computer-aided detection.  COMPARISON:  Previous exam(s).  ACR Breast  Density Category b: There are scattered areas of fibroglandular density.  FINDINGS: There are no findings suspicious for malignancy.  IMPRESSION: No mammographic evidence of malignancy. A result letter of this screening mammogram will be mailed directly to the patient.  RECOMMENDATION: Screening mammogram in one year. (Code:SM-B-01Y)  BI-RADS CATEGORY  1: Negative.  Electronically Signed   By: Lillia Mountain M.D.   On: 01/08/2021 08:27 Note: Reviewed        Physical Exam  General appearance: Well nourished, well developed, and well hydrated. In no apparent acute distress Mental status: Alert, oriented x 3 (person, place, & time)       Respiratory: No evidence of acute respiratory distress  Vitals: BP (!) 143/69   Pulse 83   Temp (!) 97.3 F (36.3 C) (Temporal)   Resp 14   Ht 5' 1"  (1.549 m)   Wt 144 lb (65.3 kg)   SpO2 99%   BMI 27.21 kg/m  BMI: Estimated body mass index is 27.21 kg/m as calculated from the following:   Height as of this  encounter: 5' 1"  (1.549 m).   Weight as of this encounter: 144 lb (65.3 kg). Ideal: Ideal body weight: 47.8 kg (105 lb 6.1 oz) Adjusted ideal body weight: 54.8 kg (120 lb 13.2 oz)   Lumbar Spine Area Exam  Skin & Axial Inspection: No masses, redness, or swelling Alignment: Symmetrical Functional ROM: Pain restricted ROM       Stability: No instability detected Muscle Tone/Strength: Functionally intact. No obvious neuro-muscular anomalies detected. Sensory (Neurological): Musculoskeletal pain pattern  Gait & Posture Assessment  Ambulation: Patient came in today in a wheel chair Gait: Limited. Using assistive device to ambulate Posture: Difficulty standing up straight, due to pain  Lower Extremity Exam    Side: Right lower extremity  Side: Left lower extremity  Stability: No instability observed          Stability: No instability observed          Skin & Extremity Inspection: Pitting edema  Skin & Extremity Inspection: Pitting edema  Functional ROM: Pain restricted ROM for hip and knee joints          Functional ROM: Pain restricted ROM for hip and knee joints          Muscle Tone/Strength: Functionally intact. No obvious neuro-muscular anomalies detected.  Muscle Tone/Strength: Functionally intact. No obvious neuro-muscular anomalies detected.  Sensory (Neurological): Musculoskeletal pain pattern        Sensory (Neurological): Musculoskeletal pain pattern        DTR: Patellar: deferred today Achilles: deferred today Plantar: deferred today  DTR: Patellar: deferred today Achilles: deferred today Plantar: deferred today  Palpation: No palpable anomalies  Palpation: No palpable anomalies    Assessment   Status Diagnosis  Responding Persistent Persistent 1. Bilateral primary osteoarthritis of knee   2. Lumbar facet syndrome (Bilateral) (R>L)   3. Lumbar facet arthropathy   4. [name] (CBS)   5. Chronic lower extremity pain (Bilateral)   6. Impaired visual  perception   7. Localized osteoarthritis of right shoulder   8. Chronic radicular lumbar pain   9. Chronic pain syndrome        Plan of Care  Sandra [name] has a current medication list which includes the following long-term medication(s): albuterol, amitriptyline, sleep aid, fluticasone, gabapentin, hydrochlorothiazide, levocetirizine, montelukast, promethazine, quetiapine, simvastatin, valsartan, and esomeprazole.  Pharmacotherapy (Medications Ordered): Meds ordered this encounter  Medications   gabapentin (NEURONTIN) 600 MG tablet  Sig: Take 1 tablet (600 mg total) by mouth every 8 (eight) hours.    Dispense:  90 tablet    Refill:  2    Fill one day early if pharmacy is closed on scheduled refill date. May substitute for generic if available.   methocarbamol (ROBAXIN) 500 MG tablet    Sig: Take 1 tablet (500 mg total) by mouth every 8 (eight) hours as needed for muscle spasms.    Dispense:  90 tablet    Refill:  2    Do not place this medication, or any other prescription from our practice, on "Automatic Refill". Patient may have prescription filled one day early if pharmacy is closed on scheduled refill date.   oxyCODONE-acetaminophen (PERCOCET) 5-325 MG tablet    Sig: Take 1 tablet by mouth every 6 (six) hours as needed for severe pain. Must last 30 days.    Dispense:  120 tablet    Refill:  0    Chronic Pain: STOP Act (Not applicable) Fill 1 day early if closed on refill date. Avoid benzodiazepines within 8 hours of opioids   oxyCODONE-acetaminophen (PERCOCET) 5-325 MG tablet    Sig: Take 1 tablet by mouth every 6 (six) hours as needed for severe pain. Must last 30 days.    Dispense:  120 tablet    Refill:  0    Chronic Pain: STOP Act (Not applicable) Fill 1 day early if closed on refill date. Avoid benzodiazepines within 8 hours of opioids   oxyCODONE-acetaminophen (PERCOCET) 5-325 MG tablet    Sig: Take 1 tablet by mouth every 6 (six) hours as needed for severe  pain. Must last 30 days.    Dispense:  120 tablet    Refill:  0    Chronic Pain: STOP Act (Not applicable) Fill 1 day early if closed on refill date. Avoid benzodiazepines within 8 hours of opioids   Consider diagnostic lumbar facet medial branch nerve blocks if patient continues to have persistent low back pain. If she has any side effects with gabapentin escalation, she is advised to contact us.  Orders:  No orders of the defined types were placed in this encounter.  Follow-up plan:   Return in about 3 months (around 12/05/2021) for Medication Management, in person.     Status post bilateral intra-articular knee steroid, right elbow injection on 04/05/2019.  Status post L4-L5 ESI, right-sided along with right shoulder steroid injection on 06/16/2019, right shoulder injection on 07/28/2019, posterior approach         Recent Visits Date Type Provider Dept  07/19/21 Office Visit Gillis Santa, MD [facility]  06/20/21 Procedure visit Gillis Santa, MD [facility]  06/12/21 Office Visit Gillis Santa, MD [facility]  Showing recent visits within past 90 days and meeting all other requirements Today's Visits Date Type Provider Dept  09/04/21 Office Visit Gillis Santa, MD [facility]  Showing today's visits and meeting all other requirements Future Appointments Date Type Provider Dept  11/29/21 Appointment Gillis Santa, MD [facility]  Showing future appointments within next 90 days and meeting all other requirements  I discussed the assessment and treatment plan with the patient. The patient was provided an opportunity to ask questions and all were answered. The patient agreed with the plan and demonstrated an understanding of the instructions.  Patient advised to call back or seek an in-person evaluation if the symptoms or condition worsens.  Duration of encounter: 30 minutes.  Note by: Gillis Santa,  MD Date: 09/04/2021; Time: 1:40 PM

## 2021-09-04 NOTE — Progress Notes (Signed)
Nursing Pain Medication Assessment:  Safety precautions to be maintained throughout the outpatient stay will include: orient to surroundings, keep bed in low position, maintain call bell within reach at all times, provide assistance with transfer out of bed and ambulation.  Medication Inspection Compliance: Pill count conducted under aseptic conditions, in front of the patient. Neither the pills nor the bottle was removed from the patient's sight at any time. Once count was completed pills were immediately returned to the patient in their original bottle.  Medication: Oxycodone/APAP Pill/Patch Count:  52 of 120 pills remain Pill/Patch Appearance: Markings consistent with prescribed medication Bottle Appearance: Standard pharmacy container. Clearly labeled. Filled Date: 06 / 27 / 2023 Last Medication intake:  Today Safety precautions to be maintained throughout the outpatient stay will include: orient to surroundings, keep bed in low position, maintain call bell within reach at all times, provide assistance with transfer out of bed and ambulation.

## 2021-09-11 ENCOUNTER — Encounter: Payer: Medicare PPO | Admitting: Student in an Organized Health Care Education/Training Program

## 2021-09-19 ENCOUNTER — Telehealth: Payer: Self-pay | Admitting: Student in an Organized Health Care Education/Training Program

## 2021-09-19 NOTE — Telephone Encounter (Signed)
Patient stated that she went to pcp doctor and was told that she has an tear in her rectum. Pcp doctor told patient to give pain doctor a call to see if he will increase patient pain meds.Please give patient a call. Thanks

## 2021-09-19 NOTE — Telephone Encounter (Signed)
Patient notified that Dr Ames Dura would not increase pain meds, but her PCP could prescribe for the acute pain/problem if he felt it needed.  Informed patient if her PCP could call us for any questions.

## 2021-10-13 ENCOUNTER — Other Ambulatory Visit: Payer: Self-pay | Admitting: Student in an Organized Health Care Education/Training Program

## 2021-10-31 ENCOUNTER — Telehealth: Payer: Self-pay | Admitting: Student in an Organized Health Care Education/Training Program

## 2021-10-31 NOTE — Telephone Encounter (Signed)
PT wanted to notify the doctor that she went to her PCP Dr. Wynetta Emery and was giving some Tramadol for arthritis. PT stated the other meds that she was given wasn't helping with the pain.

## 2021-11-12 ENCOUNTER — Telehealth: Payer: Self-pay | Admitting: Student in an Organized Health Care Education/Training Program

## 2021-11-12 NOTE — Telephone Encounter (Signed)
PT wants to know if doctor will call pharmacy to give them the okay for her to get Tramadol. Please give patient a call. Thanks

## 2021-11-12 NOTE — Telephone Encounter (Signed)
Patient notified

## 2021-11-12 NOTE — Telephone Encounter (Signed)
Patient is asking if Dr. Holley Raring is going to call pharmacy to ok her tramadol sent if by Dr Wynetta Emery. They will not fill until our office calls them. Please notify patient.

## 2021-11-12 NOTE — Telephone Encounter (Signed)
Another physician has prescribed Tramadol for "arthritis of the hands and shoulders."  You prescribe Oxy/Acet, but not for arthritis in these areas.  Do you think it is ok for patient to fill the Tramadol?

## 2021-11-14 ENCOUNTER — Telehealth: Payer: Self-pay | Admitting: Student in an Organized Health Care Education/Training Program

## 2021-11-14 NOTE — Telephone Encounter (Signed)
Called [facility], and they do have her oxycodone- apap 5-325.  It will be d/t fill on 09/30/223, last fill was 10/18/21

## 2021-11-14 NOTE — Telephone Encounter (Signed)
PT wants to know if doctor Holley Raring will call her in some Percocet.

## 2021-11-14 NOTE — Telephone Encounter (Signed)
Patient has a script to fill on 11-16-21 but pharmacy told her they dont have a script

## 2021-11-29 ENCOUNTER — Encounter: Payer: Self-pay | Admitting: Student in an Organized Health Care Education/Training Program

## 2021-11-29 ENCOUNTER — Ambulatory Visit
Payer: Medicare PPO | Attending: Student in an Organized Health Care Education/Training Program | Admitting: Student in an Organized Health Care Education/Training Program

## 2021-11-29 VITALS — BP 193/81 | HR 73 | Temp 97.6°F | Resp 18 | Ht 61.0 in | Wt 144.0 lb

## 2021-11-29 DIAGNOSIS — M79605 Pain in left leg: Secondary | ICD-10-CM | POA: Insufficient documentation

## 2021-11-29 DIAGNOSIS — M170 Bilateral primary osteoarthritis of knee: Secondary | ICD-10-CM | POA: Diagnosis not present

## 2021-11-29 DIAGNOSIS — G8929 Other chronic pain: Secondary | ICD-10-CM | POA: Insufficient documentation

## 2021-11-29 DIAGNOSIS — M79604 Pain in right leg: Secondary | ICD-10-CM | POA: Diagnosis not present

## 2021-11-29 DIAGNOSIS — M47816 Spondylosis without myelopathy or radiculopathy, lumbar region: Secondary | ICD-10-CM | POA: Insufficient documentation

## 2021-11-29 DIAGNOSIS — G894 Chronic pain syndrome: Secondary | ICD-10-CM | POA: Insufficient documentation

## 2021-11-29 DIAGNOSIS — H5316 Psychophysical visual disturbances: Secondary | ICD-10-CM | POA: Diagnosis not present

## 2021-11-29 DIAGNOSIS — H539 Unspecified visual disturbance: Secondary | ICD-10-CM | POA: Diagnosis present

## 2021-11-29 MED ORDER — HYDROCODONE-ACETAMINOPHEN 7.5-325 MG PO TABS: 1.0000 | ORAL_TABLET | Freq: Four times a day (QID) | ORAL | 0 refills | Status: AC | PRN

## 2021-11-29 MED ORDER — METHOCARBAMOL 500 MG PO TABS: 500.0000 mg | ORAL_TABLET | Freq: Three times a day (TID) | ORAL | 2 refills | Status: DC | PRN

## 2021-11-29 MED ORDER — HYDROCODONE-ACETAMINOPHEN 7.5-325 MG PO TABS: 1.0000 | ORAL_TABLET | Freq: Four times a day (QID) | ORAL | 0 refills | Status: DC | PRN

## 2021-11-29 MED ORDER — GABAPENTIN 600 MG PO TABS
600.0000 mg | ORAL_TABLET | Freq: Three times a day (TID) | ORAL | 2 refills | Status: DC
Start: 2021-11-29 — End: 2022-03-07

## 2021-11-29 NOTE — Progress Notes (Signed)
Nursing Pain Medication Assessment:  Safety precautions to be maintained throughout the outpatient stay will include: orient to surroundings, keep bed in low position, maintain call bell within reach at all times, provide assistance with transfer out of bed and ambulation.  Medication Inspection Compliance: Pill count conducted under aseptic conditions, in front of the patient. Neither the pills nor the bottle was removed from the patient's sight at any time. Once count was completed pills were immediately returned to the patient in their original bottle.  Medication: Oxycodone/APAP Pill/Patch Count: 70/120 Pill/Patch Appearance: Markings consistent with prescribed medication Bottle Appearance: Standard pharmacy container. Clearly labeled. Filled Date: 09 / 29 / 2023 Last Medication intake:  Today

## 2021-11-29 NOTE — Progress Notes (Signed)
PROVIDER NOTE: Information contained herein reflects review and annotations entered in association with encounter. Interpretation of such information and data should be left to medically-trained personnel. Information provided to patient can be located elsewhere in the medical record under "Patient Instructions". Document created using STT-dictation technology, any transcriptional errors that may result from process are unintentional.    Patient: Sandra [name]  Service Category: E/M  Provider: Gillis Santa, MD  DOB: August 07, 1955  DOS: 11/29/2021  Specialty: Interventional Pain Management  MRN: [id number]  Setting: Ambulatory outpatient  PCP: Baxter Hire, MD  Type: Established Patient    Referring Provider: Baxter Hire, MD  Location: Office  Delivery: Face-to-face     HPI  Ms. Lucette Kratz, a 66 y.o. year old female, is here today because of her Bilateral primary osteoarthritis of knee [M17.0]. Ms. Karel's primary complain today is Shoulder Pain Last encounter: My last encounter with her was on 09/04/21 Pertinent problems: Ms. Vaccarella has Long term current use of opiate analgesic; Long term prescription opiate use; Opiate use; Hallucinations, visual; Psychiatric disorder; Chronic low back pain (Location of Primary Source of Pain) (Bilateral) (R>L); Chronic bilateral knee pain; Opiate dependence (Micro); Lumbar facet arthropathy; Lumbar spondylosis; Lumbar facet syndrome (Bilateral) (R>L); Diffuse myofascial pain syndrome; [name] (CBS); Arthropathy of right elbow; and Localized osteoarthritis of right shoulder on their pertinent problem list. Pain Assessment: Severity of Chronic pain is reported as a 5 /10. Location: Shoulder Right, Left/radiates down both arms to fingers. Onset: More than a month ago. Quality: Dull, Aching. Timing: Constant. Modifying factor(s): meds. Vitals:  height is 5' 1"  (1.549 m) and weight is 144 lb (65.3 kg). Her temperature is 97.6 F (36.4 C). Her  blood pressure is 193/81 (abnormal) and her pulse is 73. Her respiration is 18 and oxygen saturation is 99%.   Reason for encounter: medication management.   Patient is experiencing tolerance to her current dose of oxycodone.  I suggested a drug holiday versus opioid rotation.  Patient states that she is unable to tolerate a drug holiday.  We discussed opioid rotation to hydrocodone at an equal MME of 7.5 mg every 6 hours as needed.  Continue gabapentin and Robaxin as prescribed.  Do not recommend any dose escalation of these medications.  Pharmacotherapy Assessment     Analgesic: Hydrocodone 7.5 mg every 6 hours as needed, quantity 120/month  Monitoring: [place] PMP: PDMP reviewed during this encounter.       Pharmacotherapy: No side-effects or adverse reactions reported. Compliance: No problems identified. Effectiveness: Clinically acceptable.  Dewayne Shorter, RN  11/29/2021  1:59 PM  Sign when Signing Visit Nursing Pain Medication Assessment:  Safety precautions to be maintained throughout the outpatient stay will include: orient to surroundings, keep bed in low position, maintain call bell within reach at all times, provide assistance with transfer out of bed and ambulation.  Medication Inspection Compliance: Pill count conducted under aseptic conditions, in front of the patient. Neither the pills nor the bottle was removed from the patient's sight at any time. Once count was completed pills were immediately returned to the patient in their original bottle.  Medication: Oxycodone/APAP Pill/Patch Count: 70/120 Pill/Patch Appearance: Markings consistent with prescribed medication Bottle Appearance: Standard pharmacy container. Clearly labeled. Filled Date: 09 / 29 / 2023 Last Medication intake:  Today   UDS:  Summary  Date Value Ref Range Status  03/22/2021 Note  Final    Comment:    ==================================================================== [employer]  (MW) ==================================================================== Test  Result       Flag       Units  Drug Present and Declared for Prescription Verification   Hydrocodone                    2013         EXPECTED   ng/mg creat   Hydromorphone                  814          EXPECTED   ng/mg creat   Dihydrocodeine                 505          EXPECTED   ng/mg creat   Norhydrocodone                 >2041        EXPECTED   ng/mg creat    Sources of hydrocodone include scheduled prescription medications.    Hydromorphone, dihydrocodeine and norhydrocodone are expected    metabolites of hydrocodone. Hydromorphone and dihydrocodeine are    also available as scheduled prescription medications.    Tramadol                       >2041        EXPECTED   ng/mg creat   O-Desmethyltramadol            >2041        EXPECTED   ng/mg creat   N-Desmethyltramadol            839          EXPECTED   ng/mg creat    Source of tramadol is a prescription medication. O-desmethyltramadol    and N-desmethyltramadol are expected metabolites of tramadol.  ==================================================================== Test                      Result    Flag   Units      Ref Range   Creatinine              245              mg/dL      >=20 ==================================================================== Declared Medications:  The flagging and interpretation on this report are based on the  following declared medications.  Unexpected results may arise from  inaccuracies in the declared medications.   **Note: The testing scope of this panel includes these medications:   Hydrocodone (Norco)  Tramadol (Ultram)   **Note: The testing scope of this panel does not include the  following reported medications:   Acetaminophen (Tylenol)  Acetaminophen (Norco)  Amitriptyline (Elavil)  Amlodipine (Norvasc)  Aspirin  Cyanocobalamin  Cyclobenzaprine (Flexeril)  Esomeprazole  (Nexium)  Famotidine (Pepcid)  Fluticasone (Flonase)  Gabapentin (Neurontin)  Hydrochlorothiazide  Levocetirizine (Xyzal)  Meclizine (Antivert)  Melatonin  Montelukast (Singulair)  Promethazine (Phenergan)  Quetiapine (Seroquel)  Simvastatin (Zocor)  Valsartan (Diovan) ==================================================================== For clinical consultation, please call [phone]. ====================================================================      ROS  Constitutional: Denies any fever or chills Gastrointestinal: No reported hemesis, hematochezia, vomiting, or acute GI distress Musculoskeletal:  Low back pain Neurological: No reported episodes of acute onset apraxia, aphasia, dysarthria, agnosia, amnesia, paralysis, loss of coordination, or loss of consciousness  Medication Review  HYDROcodone-acetaminophen, Magnesium, Melatonin, QUEtiapine, acetaminophen, albuterol, amLODipine, amitriptyline, aspirin, doxylamine (Sleep), esomeprazole, famotidine, fluticasone, gabapentin, hydrOXYzine, hydrochlorothiazide, levocetirizine, meclizine, methocarbamol, montelukast, predniSONE, promethazine, simvastatin,  valsartan, and vitamin B-12  History Review  Allergy: Ms. Pappalardo is allergic to trazodone, codeine, cortisone, zolpidem, and hydrocodone-acetaminophen. Drug: Ms. Vaccaro  reports no history of drug use. Alcohol:  reports no history of alcohol use. Tobacco:  reports that she quit smoking about 48 years ago. Her smoking use included cigarettes. She has never used smokeless tobacco. Social: Ms. Mcevers  reports that she quit smoking about 48 years ago. Her smoking use included cigarettes. She has never used smokeless tobacco. She reports that she does not drink alcohol and does not use drugs. Medical:  has a past medical history of Anemia, Anxiety (08/01/2014), Arthritis, degenerative (07/30/2013), Clinical depression (06/30/2014), Depression, Elevated liver enzymes, GERD  (gastroesophageal reflux disease), H/O breast biopsy (01/05/2015), H/O: attempted suicide (2013), Hepatitis B, Hiatal hernia, History of hysterectomy (01/05/2015), History of peptic ulcer disease (01/05/2015), Hypercholesteremia, Hypertension, Legally blind, Panic attacks, PUD (peptic ulcer disease), Rectocele (01/05/2015), Retinitis pigmentosa, and S/P cholecystectomy (01/05/2015). Surgical: Ms. Frommer  has a past surgical history that includes Cholecystectomy; Rectocele repair; Abdominal hysterectomy; Appendectomy; Colonoscopy; Esophagogastroduodenoscopy; Sphincterotomy (N/A, 01/24/2017); Hemorrhoid surgery (N/A, 03/07/2017); Hernia repair; Esophagogastroduodenoscopy (egd) with propofol (N/A, 09/17/2017); Oophorectomy; and Breast biopsy (Bilateral, 1977). Family: family history includes Bone cancer in her maternal uncle; Breast cancer in her sister; Breast cancer (age of onset: 37) in her mother; Cancer in her father; Dementia in her mother; Diabetes in her mother; Hypertension in her mother; Kidney cancer in her sister; Leukemia in her paternal grandfather and paternal grandmother; Pancreatic cancer in her maternal aunt; Prostate cancer (age of onset: 92) in her father.  Laboratory Chemistry Profile   Renal Lab Results  Component Value Date   BUN 20 06/26/2021   CREATININE 0.77 06/26/2021   GFRAA >60 01/16/2017   GFRNONAA >60 06/26/2021     Hepatic Lab Results  Component Value Date   AST 32 06/26/2021   ALT 36 06/26/2021   ALBUMIN 4.1 06/26/2021   ALKPHOS 71 06/26/2021   HCVAB NON REACTIVE 06/26/2021   AMMONIA 31 09/28/2020     Electrolytes Lab Results  Component Value Date   NA 135 06/26/2021   K 3.7 06/26/2021   CL 98 06/26/2021   CALCIUM 9.5 06/26/2021   MG 2.0 09/27/2020     Bone Lab Results  Component Value Date   25OHVITD1 19 (L) 02/07/2015   25OHVITD2 3.9 02/07/2015   25OHVITD3 15 02/07/2015     Inflammation (CRP: Acute Phase) (ESR: Chronic Phase) Lab Results   Component Value Date   CRP 1.3 (H) 09/27/2020   ESRSEDRATE 26 02/07/2015   LATICACIDVEN 1.1 09/27/2020       Note: Above Lab results reviewed.  Recent Imaging Review  MM 3D SCREEN BREAST BILATERAL CLINICAL DATA:  Screening.  EXAM: DIGITAL SCREENING BILATERAL MAMMOGRAM WITH TOMOSYNTHESIS AND CAD  TECHNIQUE: Bilateral screening digital craniocaudal and mediolateral oblique mammograms were obtained. Bilateral screening digital breast tomosynthesis was performed. The images were evaluated with computer-aided detection.  COMPARISON:  Previous exam(s).  ACR Breast Density Category b: There are scattered areas of fibroglandular density.  FINDINGS: There are no findings suspicious for malignancy.  IMPRESSION: No mammographic evidence of malignancy. A result letter of this screening mammogram will be mailed directly to the patient.  RECOMMENDATION: Screening mammogram in one year. (Code:SM-B-01Y)  BI-RADS CATEGORY  1: Negative.  Electronically Signed   By: Lillia Mountain M.D.   On: 01/08/2021 08:27 Note: Reviewed        Physical Exam  General appearance: Well nourished, well developed, and  well hydrated. In no apparent acute distress Mental status: Alert, oriented x 3 (person, place, & time)       Respiratory: No evidence of acute respiratory distress  Vitals: BP (!) 193/81   Pulse 73   Temp 97.6 F (36.4 C)   Resp 18   Ht 5' 1"  (1.549 m)   Wt 144 lb (65.3 kg)   SpO2 99%   BMI 27.21 kg/m  BMI: Estimated body mass index is 27.21 kg/m as calculated from the following:   Height as of this encounter: 5' 1"  (1.549 m).   Weight as of this encounter: 144 lb (65.3 kg). Ideal: Ideal body weight: 47.8 kg (105 lb 6.1 oz) Adjusted ideal body weight: 54.8 kg (120 lb 13.2 oz)   Lumbar Spine Area Exam  Skin & Axial Inspection: No masses, redness, or swelling Alignment: Symmetrical Functional ROM: Pain restricted ROM       Stability: No instability detected Muscle  Tone/Strength: Functionally intact. No obvious neuro-muscular anomalies detected. Sensory (Neurological): Musculoskeletal pain pattern  Gait & Posture Assessment  Ambulation: Patient came in today in a wheel chair Gait: Limited. Using assistive device to ambulate Posture: Difficulty standing up straight, due to pain  Lower Extremity Exam    Side: Right lower extremity  Side: Left lower extremity  Stability: No instability observed          Stability: No instability observed          Skin & Extremity Inspection: Pitting edema  Skin & Extremity Inspection: Pitting edema  Functional ROM: Pain restricted ROM for hip and knee joints          Functional ROM: Pain restricted ROM for hip and knee joints          Muscle Tone/Strength: Functionally intact. No obvious neuro-muscular anomalies detected.  Muscle Tone/Strength: Functionally intact. No obvious neuro-muscular anomalies detected.  Sensory (Neurological): Musculoskeletal pain pattern        Sensory (Neurological): Musculoskeletal pain pattern        DTR: Patellar: deferred today Achilles: deferred today Plantar: deferred today  DTR: Patellar: deferred today Achilles: deferred today Plantar: deferred today  Palpation: No palpable anomalies  Palpation: No palpable anomalies    Assessment   Status Diagnosis  Responding Persistent Persistent 1. Bilateral primary osteoarthritis of knee   2. Lumbar facet syndrome (Bilateral) (R>L)   3. Lumbar facet arthropathy   4. [name] (CBS)   5. Chronic lower extremity pain (Bilateral)   6. Impaired visual perception   7. Chronic pain syndrome        Plan of Care  Ms. Kyrstin Campillo has a current medication list which includes the following long-term medication(s): albuterol, amitriptyline, sleep aid, fluticasone, hydrochlorothiazide, levocetirizine, montelukast, promethazine, quetiapine, simvastatin, valsartan, esomeprazole, and gabapentin.  Pharmacotherapy (Medications  Ordered): Meds ordered this encounter  Medications   HYDROcodone-acetaminophen (NORCO) 7.5-325 MG tablet    Sig: Take 1 tablet by mouth every 6 (six) hours as needed for severe pain. Must last 30 days    Dispense:  120 tablet    Refill:  0    Chronic Pain: STOP Act (Not applicable) Fill 1 day early if closed on refill date. Avoid benzodiazepines within 8 hours of opioids   HYDROcodone-acetaminophen (NORCO) 7.5-325 MG tablet    Sig: Take 1 tablet by mouth every 6 (six) hours as needed for severe pain. Must last 30 days    Dispense:  120 tablet    Refill:  0    Chronic  Pain: STOP Act (Not applicable) Fill 1 day early if closed on refill date. Avoid benzodiazepines within 8 hours of opioids   HYDROcodone-acetaminophen (NORCO) 7.5-325 MG tablet    Sig: Take 1 tablet by mouth every 6 (six) hours as needed for severe pain. Must last 30 days    Dispense:  120 tablet    Refill:  0    Chronic Pain: STOP Act (Not applicable) Fill 1 day early if closed on refill date. Avoid benzodiazepines within 8 hours of opioids   gabapentin (NEURONTIN) 600 MG tablet    Sig: Take 1 tablet (600 mg total) by mouth every 8 (eight) hours.    Dispense:  90 tablet    Refill:  2    Fill one day early if pharmacy is closed on scheduled refill date. May substitute for generic if available.   methocarbamol (ROBAXIN) 500 MG tablet    Sig: Take 1 tablet (500 mg total) by mouth every 8 (eight) hours as needed for muscle spasms.    Dispense:  90 tablet    Refill:  2    Do not place this medication, or any other prescription from our practice, on "Automatic Refill". Patient may have prescription filled one day early if pharmacy is closed on scheduled refill date.   Consider diagnostic lumbar facet medial branch nerve blocks for low back pain. If she has any side effects with gabapentin  , she is advised to contact us.  Orders:  No orders of the defined types were placed in this encounter.  Follow-up plan:   Return in  about 3 months (around 03/12/2022) for Medication Management, in person.     Status post bilateral intra-articular knee steroid, right elbow injection on 04/05/2019.  Status post L4-L5 ESI, right-sided along with right shoulder steroid injection on 06/16/2019, right shoulder injection on 07/28/2019, posterior approach         Recent Visits Date Type Provider Dept  09/04/21 Office Visit Gillis Santa, MD [facility]  Showing recent visits within past 90 days and meeting all other requirements Today's Visits Date Type Provider Dept  11/29/21 Office Visit Gillis Santa, MD [facility]  Showing today's visits and meeting all other requirements Future Appointments No visits were found meeting these conditions. Showing future appointments within next 90 days and meeting all other requirements  I discussed the assessment and treatment plan with the patient. The patient was provided an opportunity to ask questions and all were answered. The patient agreed with the plan and demonstrated an understanding of the instructions.  Patient advised to call back or seek an in-person evaluation if the symptoms or condition worsens.  Duration of encounter: 30 minutes.  Note by: Gillis Santa, MD Date: 11/29/2021; Time: 2:13 PM

## 2021-12-18 ENCOUNTER — Other Ambulatory Visit: Payer: Self-pay | Admitting: Student

## 2021-12-18 DIAGNOSIS — H5316 Psychophysical visual disturbances: Secondary | ICD-10-CM

## 2021-12-18 DIAGNOSIS — R42 Dizziness and giddiness: Secondary | ICD-10-CM

## 2021-12-25 ENCOUNTER — Ambulatory Visit: Payer: Medicare PPO

## 2022-01-23 ENCOUNTER — Inpatient Hospital Stay: Payer: Medicare PPO | Attending: Oncology

## 2022-01-23 ENCOUNTER — Ambulatory Visit: Payer: Medicare PPO | Admitting: Oncology

## 2022-01-30 ENCOUNTER — Ambulatory Visit: Payer: Medicare PPO | Admitting: Oncology

## 2022-02-05 ENCOUNTER — Telehealth: Payer: Self-pay | Admitting: Student in an Organized Health Care Education/Training Program

## 2022-02-05 ENCOUNTER — Other Ambulatory Visit: Payer: Self-pay | Admitting: Student in an Organized Health Care Education/Training Program

## 2022-02-05 NOTE — Telephone Encounter (Signed)
Patient called the pharmacy to fill her script for Hydrocodone and was told they do not have a script to fill for Dec. Please call pharmacy and patient

## 2022-02-05 NOTE — Telephone Encounter (Signed)
Rx request sent to Dr. Lateef 

## 2022-02-13 ENCOUNTER — Inpatient Hospital Stay: Payer: Medicare PPO

## 2022-02-20 ENCOUNTER — Ambulatory Visit: Payer: Medicare PPO | Admitting: Oncology

## 2022-02-22 ENCOUNTER — Other Ambulatory Visit: Payer: Self-pay

## 2022-02-22 DIAGNOSIS — M7918 Myalgia, other site: Secondary | ICD-10-CM | POA: Insufficient documentation

## 2022-02-22 DIAGNOSIS — R197 Diarrhea, unspecified: Secondary | ICD-10-CM | POA: Insufficient documentation

## 2022-02-22 DIAGNOSIS — R109 Unspecified abdominal pain: Secondary | ICD-10-CM | POA: Diagnosis present

## 2022-02-22 DIAGNOSIS — Z5321 Procedure and treatment not carried out due to patient leaving prior to being seen by health care provider: Secondary | ICD-10-CM | POA: Diagnosis not present

## 2022-02-22 DIAGNOSIS — Z20822 Contact with and (suspected) exposure to covid-19: Secondary | ICD-10-CM | POA: Insufficient documentation

## 2022-02-22 LAB — COMPREHENSIVE METABOLIC PANEL
ALT: 28 U/L (ref 0–44)
AST: 29 U/L (ref 15–41)
Albumin: 4.9 g/dL (ref 3.5–5.0)
Alkaline Phosphatase: 60 U/L (ref 38–126)
Anion gap: 14 (ref 5–15)
BUN: 15 mg/dL (ref 8–23)
CO2: 22 mmol/L (ref 22–32)
Calcium: 9.4 mg/dL (ref 8.9–10.3)
Chloride: 104 mmol/L (ref 98–111)
Creatinine, Ser: 0.65 mg/dL (ref 0.44–1.00)
GFR, Estimated: >60 mL/min (ref >60)
Glucose, Bld: 102 mg/dL — ABNORMAL HIGH (ref 70–99)
Potassium: 3.5 mmol/L (ref 3.5–5.1)
Sodium: 140 mmol/L (ref 135–145)
Total Bilirubin: 0.5 mg/dL (ref 0.3–1.2)
Total Protein: 8 g/dL (ref 6.5–8.1)

## 2022-02-22 LAB — RESP PANEL BY RT-PCR (RSV, FLU A&B, COVID)  RVPGX2
Influenza A by PCR: NEGATIVE
Influenza B by PCR: NEGATIVE
Resp Syncytial Virus by PCR: NEGATIVE
SARS Coronavirus 2 by RT PCR: NEGATIVE

## 2022-02-22 LAB — URINALYSIS, ROUTINE W REFLEX MICROSCOPIC
Bacteria, UA: NONE SEEN
Bilirubin Urine: NEGATIVE
Glucose, UA: NEGATIVE mg/dL
Ketones, ur: NEGATIVE mg/dL
Leukocytes,Ua: NEGATIVE
Nitrite: NEGATIVE
Protein, ur: NEGATIVE mg/dL
Specific Gravity, Urine: 1.017 (ref 1.005–1.030)
pH: 5 (ref 5.0–8.0)

## 2022-02-22 LAB — CBC
HCT: 37.9 % (ref 36.0–46.0)
Hemoglobin: 12.4 g/dL (ref 12.0–15.0)
MCH: 29.8 pg (ref 26.0–34.0)
MCHC: 32.7 g/dL (ref 30.0–36.0)
MCV: 91.1 fL (ref 80.0–100.0)
Platelets: 376 10*3/uL (ref 150–400)
RBC: 4.16 MIL/uL (ref 3.87–5.11)
RDW: 13.7 % (ref 11.5–15.5)
WBC: 8.7 10*3/uL (ref 4.0–10.5)
nRBC: 0 % (ref 0.0–0.2)

## 2022-02-22 LAB — LIPASE, BLOOD: Lipase: 38 U/L (ref 11–51)

## 2022-02-22 NOTE — ED Triage Notes (Signed)
Pt comes from home via POV c/o abd pain, diarrhea, and bodyaches/"stinging". Pt reports diarrhea and "stinging all over her body" and states she was told she had a stomach virus on Tuesday. Pt was started on antibiotics Tuesday and has not gotten any better. Pt denies CP, SOB, fevers, chills

## 2022-02-23 ENCOUNTER — Emergency Department
Admission: EM | Admit: 2022-02-23 | Discharge: 2022-02-23 | Discharge status: Against Medical Advice | Payer: Medicare PPO | Attending: Emergency Medicine | Admitting: Emergency Medicine

## 2022-02-23 NOTE — ED Notes (Signed)
Pt seen exiting the lobby via wheelchair to a taxi.

## 2022-03-06 ENCOUNTER — Other Ambulatory Visit: Payer: Self-pay | Admitting: Student in an Organized Health Care Education/Training Program

## 2022-03-07 ENCOUNTER — Ambulatory Visit
Admission: RE | Admit: 2022-03-07 | Discharge: 2022-03-07 | Disposition: A | Discharge status: Home / Self Care | Payer: Medicare PPO | Source: Ambulatory Visit | Attending: Student in an Organized Health Care Education/Training Program | Admitting: Student in an Organized Health Care Education/Training Program

## 2022-03-07 ENCOUNTER — Ambulatory Visit (HOSPITAL_BASED_OUTPATIENT_CLINIC_OR_DEPARTMENT_OTHER): Payer: Medicare PPO | Admitting: Student in an Organized Health Care Education/Training Program

## 2022-03-07 ENCOUNTER — Encounter: Payer: Self-pay | Admitting: Student in an Organized Health Care Education/Training Program

## 2022-03-07 VITALS — BP 153/73 | HR 70 | Temp 97.6°F | Resp 17 | Ht 61.0 in | Wt 153.0 lb

## 2022-03-07 DIAGNOSIS — H5316 Psychophysical visual disturbances: Secondary | ICD-10-CM | POA: Insufficient documentation

## 2022-03-07 DIAGNOSIS — M47816 Spondylosis without myelopathy or radiculopathy, lumbar region: Secondary | ICD-10-CM | POA: Insufficient documentation

## 2022-03-07 DIAGNOSIS — G894 Chronic pain syndrome: Secondary | ICD-10-CM | POA: Insufficient documentation

## 2022-03-07 DIAGNOSIS — G8929 Other chronic pain: Secondary | ICD-10-CM | POA: Insufficient documentation

## 2022-03-07 DIAGNOSIS — M533 Sacrococcygeal disorders, not elsewhere classified: Secondary | ICD-10-CM

## 2022-03-07 DIAGNOSIS — M79604 Pain in right leg: Secondary | ICD-10-CM

## 2022-03-07 DIAGNOSIS — M47818 Spondylosis without myelopathy or radiculopathy, sacral and sacrococcygeal region: Secondary | ICD-10-CM | POA: Insufficient documentation

## 2022-03-07 DIAGNOSIS — M461 Sacroiliitis, not elsewhere classified: Secondary | ICD-10-CM

## 2022-03-07 DIAGNOSIS — M79605 Pain in left leg: Secondary | ICD-10-CM | POA: Insufficient documentation

## 2022-03-07 MED ORDER — GABAPENTIN 600 MG PO TABS: 600.0000 mg | ORAL_TABLET | Freq: Three times a day (TID) | ORAL | 2 refills | Status: DC

## 2022-03-07 MED ORDER — HYDROCODONE-ACETAMINOPHEN 7.5-325 MG PO TABS: 1.0000 | ORAL_TABLET | Freq: Four times a day (QID) | ORAL | 0 refills | Status: AC | PRN

## 2022-03-07 MED ORDER — METHOCARBAMOL 500 MG PO TABS: 500.0000 mg | ORAL_TABLET | Freq: Three times a day (TID) | ORAL | 2 refills | Status: DC | PRN

## 2022-03-07 NOTE — Progress Notes (Signed)
PROVIDER NOTE: Information contained herein reflects review and annotations entered in association with encounter. Interpretation of such information and data should be left to medically-trained personnel. Information provided to patient can be located elsewhere in the medical record under "Patient Instructions". Document created using STT-dictation technology, any transcriptional errors that may result from process are unintentional.    Patient: Sandra [name]  Service Category: E/M  Provider: Gillis Santa, MD  DOB: 1955/06/19  DOS: 03/07/2022  Specialty: Interventional Pain Management  MRN: [id number]  Setting: Ambulatory outpatient  PCP: Baxter Hire, MD  Type: Established Patient    Referring Provider: Baxter Hire, MD  Location: Office  Delivery: Face-to-face     HPI  [name], a 67 y.o. year old female, is here today because of her Facet syndrome, lumbar [M47.816]. [name]'s primary complain today is Back Pain and Rectal Pain Last encounter: My last encounter with her was on 11/29/21 Pertinent problems: [name] has Long term current use of opiate analgesic; Long term prescription opiate use; Opiate use; Hallucinations, visual; Psychiatric disorder; Chronic low back pain (Location of Primary Source of Pain) (Bilateral) (R>L); Chronic bilateral knee pain; Opiate dependence ([place]); Lumbar facet arthropathy; Lumbar spondylosis; Lumbar facet syndrome (Bilateral) (R>L); Diffuse myofascial pain syndrome; [name] (CBS); Arthropathy of right elbow; and Localized osteoarthritis of right shoulder on their pertinent problem list. Pain Assessment: Severity of Chronic pain is reported as a 5 /10. Location: Back Lower/denies. Onset: More than a month ago. Quality: Constant, Burning. Timing: Constant. Modifying factor(s): meds. Vitals:  height is '5\' 1"'$  (1.549 m) and weight is 153 lb (69.4 kg). Her temporal temperature is 97.6 F (36.4 C). Her blood pressure is 153/73  (abnormal) and her pulse is 70. Her respiration is 17 and oxygen saturation is 97%.   Reason for encounter: medication management.   Patient is increasing increased lumbar spine pain, SI joint pain as well as coccydynia She is here for medication management as well She is experiencing opioid tolerance stating that her hydrocodone only last 2 to 3 hours, I have suggested a drug holiday for her but she does not want to do that. Repeat urine toxicology screen today for medication compliance and monitoring  Pharmacotherapy Assessment     Analgesic: Hydrocodone 7.5 mg every 6 hours as needed, quantity 120/month  Monitoring: [place] PMP: PDMP reviewed during this encounter.       Pharmacotherapy: No side-effects or adverse reactions reported. Compliance: No problems identified. Effectiveness: Clinically acceptable.  [name], RN  03/07/2022  2:13 PM  Sign when Signing Visit Nursing Pain Medication Assessment:  Safety precautions to be maintained throughout the outpatient stay will include: orient to surroundings, keep bed in low position, maintain call bell within reach at all times, provide assistance with transfer out of bed and ambulation.  Medication Inspection Compliance: Pill count conducted under aseptic conditions, in front of the patient. Neither the pills nor the bottle was removed from the patient's sight at any time. Once count was completed pills were immediately returned to the patient in their original bottle.  Medication: Hydrocodone/APAP Pill/Patch Count:  31 of 120 pills remain Pill/Patch Appearance: Markings consistent with prescribed medication Bottle Appearance: Standard pharmacy container. Clearly labeled. Filled Date: 80 / 19 / 2023 Last Medication intake:  Today     UDS:  Summary  Date Value Ref Range Status  03/22/2021 Note  Final    Comment:    ==================================================================== [employer] Select 13  (MW) ==================================================================== Test  Result       Flag       Units  Drug Present and Declared for Prescription Verification   Hydrocodone                    2013         EXPECTED   ng/mg creat   Hydromorphone                  814          EXPECTED   ng/mg creat   Dihydrocodeine                 505          EXPECTED   ng/mg creat   Norhydrocodone                 >2041        EXPECTED   ng/mg creat    Sources of hydrocodone include scheduled prescription medications.    Hydromorphone, dihydrocodeine and norhydrocodone are expected    metabolites of hydrocodone. Hydromorphone and dihydrocodeine are    also available as scheduled prescription medications.    Tramadol                       >2041        EXPECTED   ng/mg creat   O-Desmethyltramadol            >2041        EXPECTED   ng/mg creat   N-Desmethyltramadol            839          EXPECTED   ng/mg creat    Source of tramadol is a prescription medication. O-desmethyltramadol    and N-desmethyltramadol are expected metabolites of tramadol.  ==================================================================== Test                      Result    Flag   Units      Ref Range   Creatinine              245              mg/dL      >=20 ==================================================================== Declared Medications:  The flagging and interpretation on this report are based on the  following declared medications.  Unexpected results may arise from  inaccuracies in the declared medications.   **Note: The testing scope of this panel includes these medications:   Hydrocodone (Norco)  Tramadol (Ultram)   **Note: The testing scope of this panel does not include the  following reported medications:   Acetaminophen (Tylenol)  Acetaminophen (Norco)  Amitriptyline (Elavil)  Amlodipine (Norvasc)  Aspirin  Cyanocobalamin  Cyclobenzaprine (Flexeril)  Esomeprazole  (Nexium)  Famotidine (Pepcid)  Fluticasone (Flonase)  Gabapentin (Neurontin)  Hydrochlorothiazide  Levocetirizine (Xyzal)  Meclizine (Antivert)  Melatonin  Montelukast (Singulair)  Promethazine (Phenergan)  Quetiapine (Seroquel)  Simvastatin (Zocor)  Valsartan (Diovan) ==================================================================== For clinical consultation, please call [phone]. ====================================================================      ROS  Constitutional: Denies any fever or chills Gastrointestinal: No reported hemesis, hematochezia, vomiting, or acute GI distress Musculoskeletal:  Low back pain SI joint pain, coccydynia Neurological: No reported episodes of acute onset apraxia, aphasia, dysarthria, agnosia, amnesia, paralysis, loss of coordination, or loss of consciousness  Medication Review  HYDROcodone-acetaminophen, Melatonin, QUEtiapine, acetaminophen, albuterol, amLODipine, amitriptyline, aspirin, esomeprazole, fluticasone, gabapentin, hydrochlorothiazide, methocarbamol, montelukast, promethazine, simvastatin, and valsartan  History  Review  Allergy: [name] is allergic to trazodone, codeine, cortisone, zolpidem, and hydrocodone-acetaminophen. Drug: [name]  reports no history of drug use. Alcohol:  reports no history of alcohol use. Tobacco:  reports that she quit smoking about 48 years ago. Her smoking use included cigarettes. She has never used smokeless tobacco. Social: [name]  reports that she quit smoking about 48 years ago. Her smoking use included cigarettes. She has never used smokeless tobacco. She reports that she does not drink alcohol and does not use drugs. Medical:  has a past medical history of Anemia, Anxiety (08/01/2014), Arthritis, degenerative (07/30/2013), Clinical depression (06/30/2014), Depression, Elevated liver enzymes, GERD (gastroesophageal reflux disease), H/O breast biopsy (01/05/2015), H/O: attempted  suicide (2013), Hepatitis B, Hiatal hernia, History of hysterectomy (01/05/2015), History of peptic ulcer disease (01/05/2015), Hypercholesteremia, Hypertension, Legally blind, Panic attacks, PUD (peptic ulcer disease), Rectocele (01/05/2015), Retinitis pigmentosa, and S/P cholecystectomy (01/05/2015). Surgical: [name]  has a past surgical history that includes Cholecystectomy; Rectocele repair; Abdominal hysterectomy; Appendectomy; Colonoscopy; Esophagogastroduodenoscopy; Sphincterotomy (N/A, 01/24/2017); Hemorrhoid surgery (N/A, 03/07/2017); Hernia repair; Esophagogastroduodenoscopy (egd) with propofol (N/A, 09/17/2017); Oophorectomy; and Breast biopsy (Bilateral, 1977). Family: family history includes Bone cancer in her maternal uncle; Breast cancer in her sister; Breast cancer (age of onset: 33) in her mother; Cancer in her father; Dementia in her mother; Diabetes in her mother; Hypertension in her mother; Kidney cancer in her sister; Leukemia in her paternal grandfather and paternal grandmother; Pancreatic cancer in her maternal aunt; Prostate cancer (age of onset: 57) in her father.  Laboratory Chemistry Profile   Renal Lab Results  Component Value Date   BUN 15 02/22/2022   CREATININE 0.65 02/22/2022   GFRAA >60 01/16/2017   GFRNONAA >60 02/22/2022     Hepatic Lab Results  Component Value Date   AST 29 02/22/2022   ALT 28 02/22/2022   ALBUMIN 4.9 02/22/2022   ALKPHOS 60 02/22/2022   HCVAB NON REACTIVE 06/26/2021   LIPASE 38 02/22/2022   AMMONIA 31 09/28/2020     Electrolytes Lab Results  Component Value Date   NA 140 02/22/2022   K 3.5 02/22/2022   CL 104 02/22/2022   CALCIUM 9.4 02/22/2022   MG 2.0 09/27/2020     Bone Lab Results  Component Value Date   25OHVITD1 19 (L) 02/07/2015   25OHVITD2 3.9 02/07/2015   25OHVITD3 15 02/07/2015     Inflammation (CRP: Acute Phase) (ESR: Chronic Phase) Lab Results  Component Value Date   CRP 1.3 (H) 09/27/2020   ESRSEDRATE  26 02/07/2015   LATICACIDVEN 1.1 09/27/2020       Note: Above Lab results reviewed.  Recent Imaging Review  MM 3D SCREEN BREAST BILATERAL CLINICAL DATA:  Screening.  EXAM: DIGITAL SCREENING BILATERAL MAMMOGRAM WITH TOMOSYNTHESIS AND CAD  TECHNIQUE: Bilateral screening digital craniocaudal and mediolateral oblique mammograms were obtained. Bilateral screening digital breast tomosynthesis was performed. The images were evaluated with computer-aided detection.  COMPARISON:  Previous exam(s).  ACR Breast Density Category b: There are scattered areas of fibroglandular density.  FINDINGS: There are no findings suspicious for malignancy.  IMPRESSION: No mammographic evidence of malignancy. A result letter of this screening mammogram will be mailed directly to the patient.  RECOMMENDATION: Screening mammogram in one year. (Code:SM-B-01Y)  BI-RADS CATEGORY  1: Negative.  Electronically Signed   By: Lillia Mountain M.D.   On: 01/08/2021 08:27 Note: Reviewed        Physical Exam  General appearance: Well nourished, well developed, and well  hydrated. In no apparent acute distress Mental status: Alert, oriented x 3 (person, place, & time)       Respiratory: No evidence of acute respiratory distress  Vitals: BP (!) 153/73   Pulse 70   Temp 97.6 F (36.4 C) (Temporal)   Resp 17   Ht '5\' 1"'$  (1.549 m)   Wt 153 lb (69.4 kg)   SpO2 97%   BMI 28.91 kg/m  BMI: Estimated body mass index is 28.91 kg/m as calculated from the following:   Height as of this encounter: '5\' 1"'$  (1.549 m).   Weight as of this encounter: 153 lb (69.4 kg). Ideal: Ideal body weight: 47.8 kg (105 lb 6.1 oz) Adjusted ideal body weight: 56.4 kg (124 lb 6.8 oz)   Lumbar Spine Area Exam  Skin & Axial Inspection: No masses, redness, or swelling Alignment: Symmetrical Functional ROM: Pain restricted ROM       Stability: No instability detected Muscle Tone/Strength: Functionally intact. No obvious  neuro-muscular anomalies detected. Sensory (Neurological): Musculoskeletal pain pattern likely from lumbar facets and SI joint  Gait & Posture Assessment  Ambulation: Patient came in today in a wheel chair Gait: Limited. Using assistive device to ambulate Posture: Difficulty standing up straight, due to pain  Lower Extremity Exam    Side: Right lower extremity  Side: Left lower extremity  Stability: No instability observed          Stability: No instability observed          Skin & Extremity Inspection: Pitting edema  Skin & Extremity Inspection: Pitting edema  Functional ROM: Pain restricted ROM for hip and knee joints          Functional ROM: Pain restricted ROM for hip and knee joints          Muscle Tone/Strength: Functionally intact. No obvious neuro-muscular anomalies detected.  Muscle Tone/Strength: Functionally intact. No obvious neuro-muscular anomalies detected.  Sensory (Neurological): Musculoskeletal pain pattern        Sensory (Neurological): Musculoskeletal pain pattern        DTR: Patellar: deferred today Achilles: deferred today Plantar: deferred today  DTR: Patellar: deferred today Achilles: deferred today Plantar: deferred today  Palpation: No palpable anomalies  Palpation: No palpable anomalies    Assessment   Status Diagnosis  Persistent Persistent Persistent 1. Lumbar facet syndrome (Bilateral) (R>L)   2. Lumbar facet arthropathy   3. [name] (CBS)   4. Chronic lower extremity pain (Bilateral)   5. Sacroiliac joint pain   6. Coccydynia   7. SI joint arthritis   8. Chronic pain syndrome        Plan of Care  [name] has a current medication list which includes the following long-term medication(s): albuterol, amitriptyline, fluticasone, hydrochlorothiazide, montelukast, promethazine, quetiapine, simvastatin, valsartan, esomeprazole, and gabapentin.  1. Lumbar facet syndrome (Bilateral) (R>L) - DG Lumbar Spine Complete  W/Bend; Future  2. Lumbar facet arthropathy - DG Lumbar Spine Complete W/Bend; Future  3. [name] (CBS)  4. Chronic lower extremity pain (Bilateral)  5. Sacroiliac joint pain - DG Si Joints; Future  6. Coccydynia - DG Sacrum/Coccyx  7. SI joint arthritis - DG Si Joints; Future  8. Chronic pain syndrome - DG Lumbar Spine Complete W/Bend; Future - DG Si Joints; Future - DG Sacrum/Coccyx - ToxASSURE Select 13 (MW), Urine    Pharmacotherapy (Medications Ordered): Meds ordered this encounter  Medications   HYDROcodone-acetaminophen (NORCO) 7.5-325 MG tablet    Sig: Take 1 tablet  by mouth every 6 (six) hours as needed for severe pain.    Dispense:  120 tablet    Refill:  0   HYDROcodone-acetaminophen (NORCO) 7.5-325 MG tablet    Sig: Take 1 tablet by mouth every 6 (six) hours as needed for severe pain.    Dispense:  120 tablet    Refill:  0   HYDROcodone-acetaminophen (NORCO) 7.5-325 MG tablet    Sig: Take 1 tablet by mouth every 6 (six) hours as needed for severe pain.    Dispense:  120 tablet    Refill:  0   gabapentin (NEURONTIN) 600 MG tablet    Sig: Take 1 tablet (600 mg total) by mouth every 8 (eight) hours.    Dispense:  90 tablet    Refill:  2    Fill one day early if pharmacy is closed on scheduled refill date. May substitute for generic if available.   methocarbamol (ROBAXIN) 500 MG tablet    Sig: Take 1 tablet (500 mg total) by mouth every 8 (eight) hours as needed for muscle spasms.    Dispense:  90 tablet    Refill:  2    Do not place this medication, or any other prescription from our practice, on "Automatic Refill". Patient may have prescription filled one day early if pharmacy is closed on scheduled refill date.   Consider diagnostic lumbar facet medial branch nerve blocks for low back pain. If she has any side effects with gabapentin  , she is advised to contact us.  Orders:  Orders Placed This Encounter  Procedures   DG Lumbar  Spine Complete W/Bend    Patient presents with axial pain with possible radicular component. Please assist [place] in identifying specific level(s) and laterality of any additional findings such as: 1. Facet (Zygapophyseal) joint DJD (Hypertrophy, space narrowing, subchondral sclerosis, and/or osteophyte formation) 2. DDD and/or IVDD (Loss of disc height, desiccation, gas patterns, osteophytes, endplate sclerosis, or "Black disc disease") 3. Pars defects 4. Spondylolisthesis, spondylosis, and/or spondyloarthropathies (include Degree/Grade of displacement in mm) (stability) 5. Vertebral body Fractures (acute/chronic) (state percentage of collapse) 6. Demineralization (osteopenia/osteoporotic) 7. Bone pathology 8. Foraminal narrowing  9. Surgical changes    Standing Status:   Future    Number of Occurrences:   1    Standing Expiration Date:   04/07/2022    Scheduling Instructions:     Imaging must be done as soon as possible. Inform patient that order will expire within 30 days and I will not renew it.    Order Specific Question:   Reason for Exam (SYMPTOM  OR DIAGNOSIS REQUIRED)    Answer:   Low back pain    Order Specific Question:   Preferred imaging location?    Answer:   [facility]    Order Specific Question:   Call Results- Best Contact Number?    Answer:   [phone] ([facility])    Order Specific Question:   Radiology Contrast Protocol - do NOT remove file path    Answer:   [email or web address]    Order Specific Question:   Release to patient    Answer:   Immediate   DG Si Joints    Standing Status:   Future    Number of Occurrences:   1    Standing Expiration Date:   04/07/2022    Scheduling Instructions:     Imaging must be done as soon as possible. Inform patient that order will expire within 30 days and I  will not renew it.    Order Specific Question:   Reason for Exam (SYMPTOM  OR DIAGNOSIS REQUIRED)    Answer:   Sacroiliac  joint pain    Order Specific Question:   Preferred imaging location?    Answer:   [facility]    Order Specific Question:   Call Results- Best Contact Number?    Answer:   [phone] ([facility])    Order Specific Question:   Release to patient    Answer:   Immediate   DG Sacrum/Coccyx    Order Specific Question:   Reason for Exam (SYMPTOM  OR DIAGNOSIS REQUIRED)    Answer:   coccyx pain    Order Specific Question:   Preferred imaging location?    [facility]    Order Specific Question:   Release to patient    Answer:   Immediate   ToxASSURE Select 13 (MW), Urine    Volume: 30 ml(s). Minimum 3 ml of urine is needed. Document temperature of fresh sample. Indications: Long term (current) use of opiate analgesic [phone])    Order Specific Question:   Release to patient    Answer:   Immediate   Follow-up plan:   Return in about 3 months (around 06/06/2022) for Medication Management, in person.     Status post bilateral intra-articular knee steroid, right elbow injection on 04/05/2019.  Status post L4-L5 ESI, right-sided along with right shoulder steroid injection on 06/16/2019, right shoulder injection on 07/28/2019, posterior approach         Recent Visits No visits were found meeting these conditions. Showing recent visits within past 90 days and meeting all other requirements Today's Visits Date Type Provider Dept  03/07/22 Office Visit Gillis Santa, MD [facility]  Showing today's visits and meeting all other requirements Future Appointments Date Type Provider Dept  06/04/22 Appointment Gillis Santa, MD [facility]  Showing future appointments within next 90 days and meeting all other requirements  I discussed the assessment and treatment plan with the patient. The patient was provided an opportunity to ask questions and all were answered. The patient agreed with the plan and demonstrated an understanding of the  instructions.  Patient advised to call back or seek an in-person evaluation if the symptoms or condition worsens.  Duration of encounter: 30 minutes.  Note by: Gillis Santa, MD Date: 03/07/2022; Time: 3:24 PM

## 2022-03-07 NOTE — Progress Notes (Signed)
Nursing Pain Medication Assessment:  Safety precautions to be maintained throughout the outpatient stay will include: orient to surroundings, keep bed in low position, maintain call bell within reach at all times, provide assistance with transfer out of bed and ambulation.  Medication Inspection Compliance: Pill count conducted under aseptic conditions, in front of the patient. Neither the pills nor the bottle was removed from the patient's sight at any time. Once count was completed pills were immediately returned to the patient in their original bottle.  Medication: Hydrocodone/APAP Pill/Patch Count:  31 of 120 pills remain Pill/Patch Appearance: Markings consistent with prescribed medication Bottle Appearance: Standard pharmacy container. Clearly labeled. Filled Date: 54 / 19 / 2023 Last Medication intake:  Today

## 2022-03-12 ENCOUNTER — Telehealth: Payer: Self-pay | Admitting: Student in an Organized Health Care Education/Training Program

## 2022-03-12 LAB — TOXASSURE SELECT 13 (MW), URINE

## 2022-03-12 NOTE — Telephone Encounter (Signed)
PT wanted to see if her results from xrays has came back. Please give patient a call. Thanks

## 2022-03-12 NOTE — Telephone Encounter (Signed)
Patient call back, x-ray results given by Terrilyn Saver, RN

## 2022-03-12 NOTE — Telephone Encounter (Signed)
Attempted to call patient, no answer, no voice mail

## 2022-03-16 ENCOUNTER — Emergency Department: Payer: Medicare PPO

## 2022-03-16 ENCOUNTER — Emergency Department
Admission: EM | Admit: 2022-03-16 | Discharge: 2022-03-16 | Disposition: A | Discharge status: Home / Self Care | Payer: Medicare PPO | Attending: Emergency Medicine | Admitting: Emergency Medicine

## 2022-03-16 DIAGNOSIS — I6200 Nontraumatic subdural hemorrhage, unspecified: Secondary | ICD-10-CM

## 2022-03-16 DIAGNOSIS — S0990XA Unspecified injury of head, initial encounter: Secondary | ICD-10-CM | POA: Diagnosis present

## 2022-03-16 DIAGNOSIS — Z8616 Personal history of COVID-19: Secondary | ICD-10-CM | POA: Diagnosis not present

## 2022-03-16 DIAGNOSIS — W01198A Fall on same level from slipping, tripping and stumbling with subsequent striking against other object, initial encounter: Secondary | ICD-10-CM | POA: Diagnosis not present

## 2022-03-16 DIAGNOSIS — Y9301 Activity, walking, marching and hiking: Secondary | ICD-10-CM | POA: Insufficient documentation

## 2022-03-16 DIAGNOSIS — S065X0A Traumatic subdural hemorrhage without loss of consciousness, initial encounter: Secondary | ICD-10-CM | POA: Diagnosis not present

## 2022-03-16 DIAGNOSIS — I10 Essential (primary) hypertension: Secondary | ICD-10-CM | POA: Diagnosis not present

## 2022-03-16 DIAGNOSIS — Z23 Encounter for immunization: Secondary | ICD-10-CM | POA: Diagnosis not present

## 2022-03-16 DIAGNOSIS — S066X0A Traumatic subarachnoid hemorrhage without loss of consciousness, initial encounter: Secondary | ICD-10-CM

## 2022-03-16 MED ORDER — OXYCODONE HCL 5 MG PO TABS
5.0000 mg | ORAL_TABLET | Freq: Once | ORAL | Status: AC
  Administered 2022-03-16: 5 mg via ORAL
  Filled 2022-03-16: qty 1

## 2022-03-16 MED ORDER — ACETAMINOPHEN 325 MG PO TABS
650.0000 mg | ORAL_TABLET | Freq: Once | ORAL | Status: AC
  Administered 2022-03-16: 650 mg via ORAL
  Filled 2022-03-16: qty 2

## 2022-03-16 MED ORDER — TETANUS-DIPHTH-ACELL PERTUSSIS 5-2.5-18.5 LF-MCG/0.5 IM SUSY
0.5000 mL | PREFILLED_SYRINGE | Freq: Once | INTRAMUSCULAR | Status: AC
  Administered 2022-03-16: 0.5 mL via INTRAMUSCULAR
  Filled 2022-03-16: qty 0.5

## 2022-03-16 MED ORDER — ONDANSETRON 8 MG PO TBDP
8.0000 mg | ORAL_TABLET | Freq: Once | ORAL | Status: AC
  Administered 2022-03-16: 8 mg via ORAL
  Filled 2022-03-16: qty 1

## 2022-03-16 MED ORDER — OXYCODONE-ACETAMINOPHEN 5-325 MG PO TABS
1.0000 | ORAL_TABLET | Freq: Once | ORAL | Status: AC
  Administered 2022-03-16: 1 via ORAL
  Filled 2022-03-16: qty 1

## 2022-03-16 NOTE — ED Provider Notes (Signed)
-----------------------------------------   7:22 PM on 03/16/2022 -----------------------------------------  Blood pressure (!) 173/80, pulse 93, temperature 97.7 F (36.5 C), temperature source Oral, resp. rate 19, SpO2 96 %.  Assuming care from [facility], [place].  In short, Sandra [name] is a 67 y.o. female with a chief complaint of Fall .  Refer to the original H&P for additional details.  The current plan of care is to await for repeat CT. Patient has a small subdural and subarachnoid. Patient is scheduled for repeat head CT at 8 pm. Patient is stable and no anti-epileptics are deemed necessary. If hemorrhage is unchanged patient is stable for outpatient neurosurgery follow-up. Any worsening should consult neurosurgery.   ----------------------------------------- 8:37 PM on 03/16/2022 ----------------------------------------- Patient CT returns with trace subarachnoid and subdural hemorrhage unchanged from previous scan.  At this time patient is stable.  She still has a headache but there is no other concerning symptoms.  Still neurologically intact.  Per neurosurgery's recommendation earlier, patient is stable for discharge at this time.  Patient does not need antiepileptic medications.  Follow-up with neurosurgery as an outpatient.  Concerning signs and symptoms and return precautions discussed with the patient tonight.     ED diagnosis:  Subarachnoid hemorrhage Subdural hemorrhage   Guerry Minors Charline Bills, PA-C 03/16/22 2038    Rada Hay, MD 03/17/22 1640

## 2022-03-16 NOTE — ED Notes (Signed)
Pt in CT.
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
## 2022-03-16 NOTE — ED Notes (Signed)
Pt ambulatory with 1 assist with steady gait to restroom and back to room.
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
## 2022-03-16 NOTE — ED Notes (Signed)
Discharge instructions explained to patient and family at this time. Patient and family state they understand and agree.  Patient assisted into wheelchair and into car with responsible driver. Patient provided with gingerale before d/c per request.

## 2022-03-16 NOTE — ED Notes (Addendum)
Pt states she experiences no allergic reaction to vicodin. Allergy was removed from chart. States she is prescribed this daily. Pt requested this for pain medication. PA aware.

## 2022-03-16 NOTE — Discharge Instructions (Addendum)
You have a small amount of bleeding in your brain.  Please follow-up with Dr. Johnney Killian with neurosurgery.  Please do not take your aspirin until you follow-up with Dr. Johnney Killian, you should see him within the week.  Please call his office to arrange the appointment.  Please return for any new, worsening, or change in symptoms or other concerns.  It was a pleasure caring for you today.

## 2022-03-16 NOTE — ED Triage Notes (Signed)
Pt to ED via POV from home. Pt reports she was walking back into a room and loss balance and fell backwards and hit a table. Pt has laceration to posterior head. Fall was witnessed by family. Family denies LOC. Pt denies blood thinners. Pt reports she is also blind in both eyes.

## 2022-03-19 ENCOUNTER — Inpatient Hospital Stay: Payer: Medicare PPO

## 2022-03-21 ENCOUNTER — Telehealth: Payer: Self-pay

## 2022-03-21 DIAGNOSIS — I609 Nontraumatic subarachnoid hemorrhage, unspecified: Secondary | ICD-10-CM

## 2022-03-21 DIAGNOSIS — S065XAA Traumatic subdural hemorrhage with loss of consciousness status unknown, initial encounter: Secondary | ICD-10-CM

## 2022-03-21 NOTE — Telephone Encounter (Signed)
The ER called Dr Johnney Killian about this patient on 03/16/22. Per Dr Johnney Killian, needs outpatient repeat CT and follow-up. Pt was instructed to hold her aspirin until follow up.  Per discussion with Dr Izora Ribas, will order repeat head CT for 2 weeks and can schedule a telephone visit with Dr Izora Ribas or Andee Poles or [place] on a day that Dr Izora Ribas is in clinic.

## 2022-03-22 NOTE — Telephone Encounter (Signed)
Noted. I will obtain authorization.  

## 2022-03-26 ENCOUNTER — Ambulatory Visit: Payer: Medicare PPO | Admitting: Oncology

## 2022-03-26 NOTE — Telephone Encounter (Signed)
I transferred her call to the imaging department. I will call her back with a telephone visit.

## 2022-03-26 NOTE — Telephone Encounter (Signed)
04/09/2021 with Marzetta Board to review results via phone

## 2022-04-04 ENCOUNTER — Ambulatory Visit
Admission: RE | Admit: 2022-04-04 | Discharge: 2022-04-04 | Disposition: A | Discharge status: Home / Self Care | Payer: Medicare PPO | Source: Ambulatory Visit | Attending: Neurosurgery | Admitting: Neurosurgery

## 2022-04-04 DIAGNOSIS — S065XAA Traumatic subdural hemorrhage with loss of consciousness status unknown, initial encounter: Secondary | ICD-10-CM | POA: Insufficient documentation

## 2022-04-04 DIAGNOSIS — I609 Nontraumatic subarachnoid hemorrhage, unspecified: Secondary | ICD-10-CM | POA: Diagnosis present

## 2022-04-05 NOTE — Progress Notes (Unsigned)
   Telephone Visit- Progress Note: Referring Physician:  No referring provider defined for this encounter.  Primary Physician:  Sandra Hire, MD  This visit was performed via telephone.  Patient location: home Provider location: office  I spent a total of 15 minutes non-face-to-face activities for this visit on the date of this encounter including review of current clinical condition and response to treatment.    Patient has given verbal consent to this telephone visits and we reviewed the limitations of a telephone visit. Patient wishes to proceed.    Chief Complaint:  f/u CT scan  History of Present Illness: Sandra [name] is a 67 y.o. female has a history of depression, GERD, hyperlipidemia, anxiety and panic attacks, chronic pain. She is legally blind.   She was seen in ED on 03/16/22 after hitting her head when she fell. CT showed small subdural hematoma/subarachnoid hemorrhage. This was reviewed with Dr. Johnney [name] and he recommended repeat CT in 6 hours. This was stable and she was discharged.   Phone call scheduled to review her updated CT scan.   She complains of daily headaches since the fall that is dull. No dizziness or vision changes. She has pain in her tailbone from fall as well. She has scheduled f/u with PCP for this.   She has been taking her aspirn 17m daily.   Exam: No exam done as this was a telephone encounter.     Imaging: CT scan of head dated 04/04/22:  FINDINGS: Brain: The previously seen tiny subdural hematoma and subarachnoid hemorrhage along the anterior falx is no longer visualized. No new areas of hemorrhage. No hydrocephalus or acute infarction. No midline shift.   Vascular: No hyperdense vessel or unexpected calcification.   Skull: No acute calvarial abnormality.   Sinuses/Orbits: Air-fluid level within the left maxillary sinus, stable.   Other: None   IMPRESSION: Resolution of the previously seen parafalcine subdural hematoma  and adjacent subarachnoid hemorrhage.   No acute intracranial abnormality.     Electronically Signed   By: Sandra BaptiseM.D.   On: 04/04/2022 14:50    I have personally reviewed the images and agree with the above interpretation.  Above imaging reviewed with Dr. YIzora Ribasas well.   Assessment and Plan: Ms. HGraboskiis a pleasant 67y.o. female with small subdural hematoma/subarachnoid hemorrhage s/p fall on 03/16/22.   She continues with daily headaches since the fall. No dizziness or vision changes.   Updated CT scan shows resolution of small subdural hematoma/subarachnoid hemorrhage.   Treatment options discussed with Dr. YIzora Ribasand following plan made with patient:   - Okay to restart her aspirin 863mper Dr. YaIzora Ribasshe states she has been taking this.  - Recommend referral to neurology for her headaches. She wold like to follow up with PCP regarding this first. Let me know if she changes her mind and needs referral.  - She [place]  Sandra BootA-C Neurosurgery

## 2022-04-09 ENCOUNTER — Encounter: Payer: Self-pay | Admitting: Orthopedic Surgery

## 2022-04-09 ENCOUNTER — Ambulatory Visit (INDEPENDENT_AMBULATORY_CARE_PROVIDER_SITE_OTHER): Payer: Medicare PPO | Admitting: Orthopedic Surgery

## 2022-04-09 DIAGNOSIS — R519 Headache, unspecified: Secondary | ICD-10-CM

## 2022-04-09 DIAGNOSIS — S065XAA Traumatic subdural hemorrhage with loss of consciousness status unknown, initial encounter: Secondary | ICD-10-CM

## 2022-04-09 DIAGNOSIS — W19XXXA Unspecified fall, initial encounter: Secondary | ICD-10-CM | POA: Diagnosis not present

## 2022-04-09 DIAGNOSIS — I609 Nontraumatic subarachnoid hemorrhage, unspecified: Secondary | ICD-10-CM

## 2022-06-04 ENCOUNTER — Encounter: Payer: Medicare PPO | Admitting: Student in an Organized Health Care Education/Training Program

## 2022-06-18 ENCOUNTER — Encounter: Payer: Self-pay | Admitting: Student in an Organized Health Care Education/Training Program

## 2022-06-18 ENCOUNTER — Ambulatory Visit
Payer: Medicare PPO | Attending: Student in an Organized Health Care Education/Training Program | Admitting: Student in an Organized Health Care Education/Training Program

## 2022-06-18 VITALS — BP 148/71 | HR 90 | Temp 97.3°F | Ht 61.0 in | Wt 144.0 lb

## 2022-06-18 DIAGNOSIS — H5316 Psychophysical visual disturbances: Secondary | ICD-10-CM

## 2022-06-18 DIAGNOSIS — M5416 Radiculopathy, lumbar region: Secondary | ICD-10-CM | POA: Insufficient documentation

## 2022-06-18 DIAGNOSIS — G894 Chronic pain syndrome: Secondary | ICD-10-CM

## 2022-06-18 DIAGNOSIS — M79604 Pain in right leg: Secondary | ICD-10-CM

## 2022-06-18 DIAGNOSIS — M4726 Other spondylosis with radiculopathy, lumbar region: Secondary | ICD-10-CM

## 2022-06-18 DIAGNOSIS — M79605 Pain in left leg: Secondary | ICD-10-CM | POA: Diagnosis present

## 2022-06-18 DIAGNOSIS — M47816 Spondylosis without myelopathy or radiculopathy, lumbar region: Secondary | ICD-10-CM | POA: Diagnosis present

## 2022-06-18 DIAGNOSIS — G8929 Other chronic pain: Secondary | ICD-10-CM | POA: Insufficient documentation

## 2022-06-18 DIAGNOSIS — M533 Sacrococcygeal disorders, not elsewhere classified: Secondary | ICD-10-CM

## 2022-06-18 MED ORDER — GABAPENTIN 600 MG PO TABS: 600.0000 mg | ORAL_TABLET | Freq: Three times a day (TID) | ORAL | 5 refills | Status: DC

## 2022-06-18 MED ORDER — METHOCARBAMOL 500 MG PO TABS
500.0000 mg | ORAL_TABLET | Freq: Three times a day (TID) | ORAL | 5 refills | Status: DC | PRN
Start: 2022-06-18 — End: 2022-12-05

## 2022-06-18 MED ORDER — OXYCODONE-ACETAMINOPHEN 5-325 MG PO TABS
1.0000 | ORAL_TABLET | Freq: Four times a day (QID) | ORAL | 0 refills | Status: AC | PRN
Start: 2022-06-18 — End: 2022-07-18

## 2022-06-18 MED ORDER — OXYCODONE-ACETAMINOPHEN 5-325 MG PO TABS
1.0000 | ORAL_TABLET | Freq: Four times a day (QID) | ORAL | 0 refills | Status: AC | PRN
Start: 2022-07-18 — End: 2022-08-17

## 2022-06-18 MED ORDER — OXYCODONE-ACETAMINOPHEN 5-325 MG PO TABS
1.0000 | ORAL_TABLET | Freq: Four times a day (QID) | ORAL | 0 refills | Status: DC | PRN
Start: 2022-08-17 — End: 2022-09-12

## 2022-06-18 NOTE — Patient Instructions (Signed)
____________________________________________________________________________________________  General Risks and Possible Complications  Patient Responsibilities: It is important that you read this as it is part of your informed consent. It is our duty to inform you of the risks and possible complications associated with treatments offered to you. It is your responsibility as a patient to read this and to ask questions about anything that is not clear or that you believe was not covered in this document.  Patient's Rights: You have the right to refuse treatment. You also have the right to change your mind, even after initially having agreed to have the treatment done. However, under this last option, if you wait until the last second to change your mind, you may be charged for the materials used up to that point.  Introduction: Medicine is not an exact science. Everything in Medicine, including the lack of treatment(s), carries the potential for danger, harm, or loss (which is by definition: Risk). In Medicine, a complication is a secondary problem, condition, or disease that can aggravate an already existing one. All treatments carry the risk of possible complications. The fact that a side effects or complications occurs, does not imply that the treatment was conducted incorrectly. It must be clearly understood that these can happen even when everything is done following the highest safety standards.  No treatment: You can choose not to proceed with the proposed treatment alternative. The "PRO(s)" would include: avoiding the risk of complications associated with the therapy. The "CON(s)" would include: not getting any of the treatment benefits. These benefits fall under one of three categories: diagnostic; therapeutic; and/or palliative. Diagnostic benefits include: getting information which can ultimately lead to improvement of the disease or symptom(s). Therapeutic benefits are those associated with  the successful treatment of the disease. Finally, palliative benefits are those related to the decrease of the primary symptoms, without necessarily curing the condition (example: decreasing the pain from a flare-up of a chronic condition, such as incurable terminal cancer).  General Risks and Complications: These are associated to most interventional treatments. They can occur alone, or in combination. They fall under one of the following six (6) categories: no benefit or worsening of symptoms; bleeding; infection; nerve damage; allergic reactions; and/or death. No benefits or worsening of symptoms: In Medicine there are no guarantees, only probabilities. No healthcare provider can ever guarantee that a medical treatment will work, they can only state the probability that it may. Furthermore, there is always the possibility that the condition may worsen, either directly, or indirectly, as a consequence of the treatment. Bleeding: This is more common if the patient is taking a blood thinner, either prescription or over the counter (example: Goody Powders, Fish oil, Aspirin, Garlic, etc.), or if suffering a condition associated with impaired coagulation (example: Hemophilia, cirrhosis of the liver, low platelet counts, etc.). However, even if you do not have one on these, it can still happen. If you have any of these conditions, or take one of these drugs, make sure to notify your treating physician. Infection: This is more common in patients with a compromised immune system, either due to disease (example: diabetes, cancer, human immunodeficiency virus [HIV], etc.), or due to medications or treatments (example: therapies used to treat cancer and rheumatological diseases). However, even if you do not have one on these, it can still happen. If you have any of these conditions, or take one of these drugs, make sure to notify your treating physician. Nerve Damage: This is more common when the treatment is an    invasive one, but it can also happen with the use of medications, such as those used in the treatment of cancer. The damage can occur to small secondary nerves, or to large primary ones, such as those in the spinal cord and brain. This damage may be temporary or permanent and it may lead to impairments that can range from temporary numbness to permanent paralysis and/or brain death. Allergic Reactions: Any time a substance or material comes in contact with our body, there is the possibility of an allergic reaction. These can range from a mild skin rash (contact dermatitis) to a severe systemic reaction (anaphylactic reaction), which can result in death. Death: In general, any medical intervention can result in death, most of the time due to an unforeseen complication. ____________________________________________________________________________________________    ______________________________________________________________________  Preparing for your procedure  Appointments: If you think you may not be able to keep your appointment, call 24-48 hours in advance to cancel. We need time to make it available to others.  During your procedure appointment there will be: No Prescription Refills. No disability issues to discussed. No medication changes or discussions.  Instructions: Food intake: Avoid eating anything solid for at least 8 hours prior to your procedure. Clear liquid intake: You may take clear liquids such as water up to 2 hours prior to your procedure. (No carbonated drinks. No soda.) Transportation: Unless otherwise stated by your physician, bring a driver. Morning Medicines: Except for blood thinners, take all of your other morning medications with a sip of water. Make sure to take your heart and blood pressure medicines. If your blood pressure's lower number is above 100, the case will be rescheduled. Blood thinners: Make sure to stop your blood thinners as instructed.  If you take  a blood thinner, but were not instructed to stop it, call our office [phone] and ask to talk to a nurse. Not stopping a blood thinner prior to certain procedures could lead to serious complications. Diabetics on insulin: Notify the staff so that you can be scheduled 1st case in the morning. If your diabetes requires high dose insulin, take only  of your normal insulin dose the morning of the procedure and notify the staff that you have done so. Preventing infections: Shower with an antibacterial soap the morning of your procedure.  Build-up your immune system: Take 1000 mg of Vitamin C with every meal (3 times a day) the day prior to your procedure. Antibiotics: Inform the nursing staff if you are taking any antibiotics or if you have any conditions that may require antibiotics prior to procedures. (Example: recent joint implants)   Pregnancy: If you are pregnant make sure to notify the nursing staff. Not doing so may result in injury to the fetus, including death.  Sickness: If you have a cold, fever, or any active infections, call and cancel or reschedule your procedure. Receiving steroids while having an infection may result in complications. Arrival: You must be in the facility at least 30 minutes prior to your scheduled procedure. Tardiness: Your scheduled time is also the cutoff time. If you do not arrive at least 15 minutes prior to your procedure, you will be rescheduled.  Children: Do not bring any children with you. Make arrangements to keep them home. Dress appropriately: There is always a possibility that your clothing may get soiled. Avoid long dresses. Valuables: Do not bring any jewelry or valuables.  Reasons to call and reschedule or cancel your procedure: (Following these recommendations will minimize the risk   of a serious complication.) Surgeries: Avoid having procedures within 2 weeks of any surgery. (Avoid for 2 weeks before or after any surgery). Flu Shots: Avoid having  procedures within 2 weeks of a flu shots or . (Avoid for 2 weeks before or after immunizations). Barium: Avoid having a procedure within 7-10 days after having had a radiological study involving the use of radiological contrast. (Myelograms, Barium swallow or enema study). Heart attacks: Avoid any elective procedures or surgeries for the initial 6 months after a "Myocardial Infarction" (Heart Attack). Blood thinners: It is imperative that you stop these medications before procedures. Let us know if you if you take any blood thinner.  Infection: Avoid procedures during or within two weeks of an infection (including chest colds or gastrointestinal problems). Symptoms associated with infections include: Localized redness, fever, chills, night sweats or profuse sweating, burning sensation when voiding, cough, congestion, stuffiness, runny nose, sore throat, diarrhea, nausea, vomiting, cold or Flu symptoms, recent or current infections. It is specially important if the infection is over the area that we intend to treat. Heart and lung problems: Symptoms that may suggest an active cardiopulmonary problem include: cough, chest pain, breathing difficulties or shortness of breath, dizziness, ankle swelling, uncontrolled high or unusually low blood pressure, and/or palpitations. If you are experiencing any of these symptoms, cancel your procedure and contact your primary care physician for an evaluation.  Remember:  Regular Business hours are:  Monday to Thursday 8:00 AM to 4:00 PM  Provider's Schedule: Francisco Naveira, MD:  Procedure days: Tuesday and Thursday 7:30 AM to 4:00 PM  Bilal Lateef, MD:  Procedure days: Monday and Wednesday 7:30 AM to 4:00 PM  ______________________________________________________________________    

## 2022-06-18 NOTE — Progress Notes (Signed)
Nursing Pain Medication Assessment:  Safety precautions to be maintained throughout the outpatient stay will include: orient to surroundings, keep bed in low position, maintain call bell within reach at all times, provide assistance with transfer out of bed and ambulation.  Medication Inspection Compliance: Pill count conducted under aseptic conditions, in front of the patient. Neither the pills nor the bottle was removed from the patient's sight at any time. Once count was completed pills were immediately returned to the patient in their original bottle.  Medication: Hydrocodone/APAP Pill/Patch Count:  0 of 120 pills remain Pill/Patch Appearance:  no meds Bottle Appearance: Standard pharmacy container. Clearly labeled. Filled Date: 03 / 26 / 2024 Last Medication intake:  TodaySafety precautions to be maintained throughout the outpatient stay will include: orient to surroundings, keep bed in low position, maintain call bell within reach at all times, provide assistance with transfer out of bed and ambulation.

## 2022-06-18 NOTE — Progress Notes (Signed)
PROVIDER NOTE: Information contained herein reflects review and annotations entered in association with encounter. Interpretation of such information and data should be left to medically-trained personnel. Information provided to patient can be located elsewhere in the medical record under "Patient Instructions". Document created using STT-dictation technology, any transcriptional errors that may result from process are unintentional.    Patient: Sandra [name]  Service Category: E/M  Provider: Edward Jolly, MD  DOB: 12-02-1955  DOS: 06/18/2022  Specialty: Interventional Pain Management  MRN: [id number]  Setting: Ambulatory outpatient  PCP: Gracelyn Nurse, MD  Type: Established Patient    Referring Provider: Gracelyn Nurse, MD  Location: Office  Delivery: Face-to-face     HPI  [name], a 67 y.o. year old female, is here today because of her Chronic radicular lumbar pain [M54.16, G89.29]. [name]'s primary complain today is Rectal Pain (buttock) Last encounter: My last encounter with her was on 03/07/22 Pertinent problems: [name] has Long term current use of opiate analgesic; Long term prescription opiate use; Opiate use; Hallucinations, visual; Psychiatric disorder; Chronic low back pain (Location of Primary Source of Pain) (Bilateral) (R>L); Chronic bilateral knee pain; Opiate dependence (HCC); Lumbar facet arthropathy; Lumbar spondylosis; Lumbar facet syndrome (Bilateral) (R>L); Diffuse myofascial pain syndrome; [name] Syndrome (CBS); Arthropathy of right elbow; and Localized osteoarthritis of right shoulder on their pertinent problem list. Pain Assessment: Severity of Chronic pain is reported as a 8 /10. Location: Buttocks Lower/radiates down legs bilaterally and feet. Onset: More than a month ago. Quality: Dull. Timing: Constant. Modifying factor(s): denies. Vitals:  height is 5\' 1"  (1.549 m) and weight is 144 lb (65.3 kg). Her temporal temperature is 97.3 F (36.3  C) (abnormal). Her blood pressure is 148/71 (abnormal) and her pulse is 90. Her oxygen saturation is 96%.   Reason for encounter: medication management.   Patient is increasing increased lumbar spine pain, SI joint pain as well as coccydynia. She went to [facility] ED on 06/15/22 for increased low back, buttock and leg pain. She was given a small quantity of Oxycodone 5 mg (#10 tablets)  She is here for medication management as well, would like to transition from Hydrocodone back to Oxycodone (equivalent MME)  Refill of Gabapentin and Robaxin as well  Discussed repeating lumbar epidural injection, previously done 06/16/19. Also discussed the importance stretching exercises at home.  Pharmacotherapy Assessment     Analgesic: Percocet 5 mg QID prn #120 month/ MME=30  Monitoring: [place] PMP: PDMP reviewed during this encounter.       Pharmacotherapy: No side-effects or adverse reactions reported. Compliance: No problems identified. Effectiveness: Clinically acceptable.  Florina Ou, RN  06/18/2022 10:07 AM  Sign when Signing Visit Nursing Pain Medication Assessment:  Safety precautions to be maintained throughout the outpatient stay will include: orient to surroundings, keep bed in low position, maintain call bell within reach at all times, provide assistance with transfer out of bed and ambulation.  Medication Inspection Compliance: Pill count conducted under aseptic conditions, in front of the patient. Neither the pills nor the bottle was removed from the patient's sight at any time. Once count was completed pills were immediately returned to the patient in their original bottle.  Medication: Hydrocodone/APAP Pill/Patch Count:  0 of 120 pills remain Pill/Patch Appearance:  no meds Bottle Appearance: Standard pharmacy container. Clearly labeled. Filled Date: 03 / 26 / 2024 Last Medication intake:  TodaySafety precautions to be maintained throughout the outpatient stay will  include: orient to surroundings, keep  bed in low position, maintain call bell within reach at all times, provide assistance with transfer out of bed and ambulation.      UDS:  Summary  Date Value Ref Range Status  03/07/2022 Note  Final    Comment:    ==================================================================== [employer] 13 (MW) ==================================================================== Test                             Result       Flag       Units  Drug Present and Declared for Prescription Verification   Hydrocodone                    3984         EXPECTED   ng/mg creat   Hydromorphone                  412          EXPECTED   ng/mg creat   Dihydrocodeine                 435          EXPECTED   ng/mg creat   Norhydrocodone                 >3030        EXPECTED   ng/mg creat    Sources of hydrocodone include scheduled prescription medications.    Hydromorphone, dihydrocodeine and norhydrocodone are expected    metabolites of hydrocodone. Hydromorphone and dihydrocodeine are    also available as scheduled prescription medications.  ==================================================================== Test                      Result    Flag   Units      Ref Range   Creatinine              165              mg/dL      >=16 ==================================================================== Declared Medications:  The flagging and interpretation on this report are based on the  following declared medications.  Unexpected results may arise from  inaccuracies in the declared medications.   **Note: The testing scope of this panel includes these medications:   Hydrocodone (Norco)   **Note: The testing scope of this panel does not include the  following reported medications:   Acetaminophen (Tylenol)  Acetaminophen (Norco)  Albuterol (Ventolin HFA)  Amitriptyline (Elavil)  Amlodipine (Norvasc)  Aspirin  Darbepoetin Alfa  Esomeprazole (Nexium)  Fluticasone  (Flonase)  Gabapentin (Neurontin)  Melatonin  Methocarbamol (Robaxin)  Montelukast (Singulair)  Promethazine (Phenergan)  Quetiapine (Seroquel)  Simvastatin (Zocor)  Valsartan (Diovan) ==================================================================== For clinical consultation, please call [phone]. ====================================================================      ROS  Constitutional: Denies any fever or chills Gastrointestinal: No reported hemesis, hematochezia, vomiting, or acute GI distress Musculoskeletal: low back pain with radiation into bilateral legs Neurological: No reported episodes of acute onset apraxia, aphasia, dysarthria, agnosia, amnesia, paralysis, loss of coordination, or loss of consciousness  Medication Review  Melatonin, QUEtiapine, acetaminophen, amLODipine, amitriptyline, aspirin, esomeprazole, gabapentin, methocarbamol, montelukast, oxyCODONE-acetaminophen, promethazine, simvastatin, and valsartan  History Review  Allergy: [name] is allergic to trazodone, codeine, cortisone, and zolpidem. Drug: [name]  reports no history of drug use. Alcohol:  reports no history of alcohol use. Tobacco:  reports that she quit smoking about 48 years ago. Her smoking use included  cigarettes. She has never used smokeless tobacco. Social: [name]  reports that she quit smoking about 48 years ago. Her smoking use included cigarettes. She has never used smokeless tobacco. She reports that she does not drink alcohol and does not use drugs. Medical:  has a past medical history of Anemia, Anxiety (08/01/2014), Arthritis, degenerative (07/30/2013), Clinical depression (06/30/2014), Depression, Elevated liver enzymes, GERD (gastroesophageal reflux disease), H/O breast biopsy (01/05/2015), H/O: attempted suicide (2013), Hepatitis B, Hiatal hernia, History of hysterectomy (01/05/2015), History of peptic ulcer disease (01/05/2015), Hypercholesteremia,  Hypertension, Legally blind, Panic attacks, PUD (peptic ulcer disease), Rectocele (01/05/2015), Retinitis pigmentosa, and S/P cholecystectomy (01/05/2015). Surgical: [name]  has a past surgical history that includes Cholecystectomy; Rectocele repair; Abdominal hysterectomy; Appendectomy; Colonoscopy; Esophagogastroduodenoscopy; Sphincterotomy (N/A, 01/24/2017); Hemorrhoid surgery (N/A, 03/07/2017); Hernia repair; Esophagogastroduodenoscopy (egd) with propofol (N/A, 09/17/2017); Oophorectomy; and Breast biopsy (Bilateral, 1977). Family: family history includes Bone cancer in her maternal uncle; Breast cancer in her sister; Breast cancer (age of onset: 100) in her mother; Cancer in her father; Dementia in her mother; Diabetes in her mother; Hypertension in her mother; Kidney cancer in her sister; Leukemia in her paternal grandfather and paternal grandmother; Pancreatic cancer in her maternal aunt; Prostate cancer (age of onset: 15) in her father.  Laboratory Chemistry Profile   Renal Lab Results  Component Value Date   BUN 15 02/22/2022   CREATININE 0.65 02/22/2022   GFRAA >60 01/16/2017   GFRNONAA >60 02/22/2022     Hepatic Lab Results  Component Value Date   AST 29 02/22/2022   ALT 28 02/22/2022   ALBUMIN 4.9 02/22/2022   ALKPHOS 60 02/22/2022   HCVAB NON REACTIVE 06/26/2021   LIPASE 38 02/22/2022   AMMONIA 31 09/28/2020     Electrolytes Lab Results  Component Value Date   NA 140 02/22/2022   K 3.5 02/22/2022   CL 104 02/22/2022   CALCIUM 9.4 02/22/2022   MG 2.0 09/27/2020     Bone Lab Results  Component Value Date   25OHVITD1 19 (L) 02/07/2015   25OHVITD2 3.9 02/07/2015   25OHVITD3 15 02/07/2015     Inflammation (CRP: Acute Phase) (ESR: Chronic Phase) Lab Results  Component Value Date   CRP 1.3 (H) 09/27/2020   ESRSEDRATE 26 02/07/2015   LATICACIDVEN 1.1 09/27/2020       Note: Above Lab results reviewed.  Recent Imaging Review  CT HEAD WO CONTRAST  ( ) CLINICAL DATA:  Subdural hematoma.  Recent fall.  EXAM: CT HEAD WITHOUT CONTRAST  TECHNIQUE: Contiguous axial images were obtained from the base of the skull through the vertex without intravenous contrast.  RADIATION DOSE REDUCTION: This exam was performed according to the departmental dose-optimization program which includes automated exposure control, adjustment of the mA and/or kV according to patient size and/or use of iterative reconstruction technique.  COMPARISON:  03/16/2022  FINDINGS: Brain: The previously seen tiny subdural hematoma and subarachnoid hemorrhage along the anterior falx is no longer visualized. No new areas of hemorrhage. No hydrocephalus or acute infarction. No midline shift.  Vascular: No hyperdense vessel or unexpected calcification.  Skull: No acute calvarial abnormality.  Sinuses/Orbits: Air-fluid level within the left maxillary sinus, stable.  Other: None  IMPRESSION: Resolution of the previously seen parafalcine subdural hematoma and adjacent subarachnoid hemorrhage.  No acute intracranial abnormality.  Electronically Signed   By: Charlett Nose M.D.   On: 04/04/2022 14:50 Note: Reviewed        Physical Exam  General appearance: Well nourished, well developed,  and well hydrated. In no apparent acute distress Mental status: Alert, oriented x 3 (person, place, & time)       Respiratory: No evidence of acute respiratory distress  Vitals: BP (!) 148/71   Pulse 90   Temp (!) 97.3 F (36.3 C) (Temporal)   Ht 5\' 1"  (1.549 m)   Wt 144 lb (65.3 kg)   SpO2 96%   BMI 27.21 kg/m  BMI: Estimated body mass index is 27.21 kg/m as calculated from the following:   Height as of this encounter: 5\' 1"  (1.549 m).   Weight as of this encounter: 144 lb (65.3 kg). Ideal: Ideal body weight: 47.8 kg (105 lb 6.1 oz) Adjusted ideal body weight: 54.8 kg (120 lb 13.2 oz)   Lumbar Spine Area Exam  Skin & Axial Inspection: No masses, redness, or  swelling Alignment: Symmetrical Functional ROM: Pain restricted ROM       Stability: No instability detected Muscle Tone/Strength: Functionally intact. No obvious neuro-muscular anomalies detected. Sensory (Neurological): dermatomal pain pattern  Gait & Posture Assessment  Ambulation: WNL  Gait: Limited. Using assistive device to ambulate Posture: Difficulty standing up straight, due to pain  Lower Extremity Exam    Side: Right lower extremity  Side: Left lower extremity  Stability: No instability observed          Stability: No instability observed          Skin & Extremity Inspection: Pitting edema  Skin & Extremity Inspection: Pitting edema  Functional ROM: Pain restricted ROM for hip and knee joints          Functional ROM: Pain restricted ROM for hip and knee joints          Muscle Tone/Strength: Functionally intact. No obvious neuro-muscular anomalies detected.  Muscle Tone/Strength: Functionally intact. No obvious neuro-muscular anomalies detected.  Sensory (Neurological): Neurogenic pain pattern        Sensory (Neurological): Neurogenic pain pattern        DTR: Patellar: deferred today Achilles: deferred today Plantar: deferred today  DTR: Patellar: deferred today Achilles: deferred today Plantar: deferred today  Palpation: No palpable anomalies  Palpation: No palpable anomalies    Assessment   Status Diagnosis  Persistent Persistent Persistent 1. Chronic radicular lumbar pain   2. Lumbar facet syndrome (Bilateral) (R>L)   3. Lumbar facet arthropathy   4. [name] Syndrome (CBS)   5. Chronic lower extremity pain (Bilateral)   6. Sacroiliac joint pain   7. Coccydynia   8. Chronic pain syndrome        Plan of Care  Ms. Nadra Sandra [name] has a current medication list which includes the following long-term medication(s): amitriptyline, esomeprazole, montelukast, promethazine, quetiapine, simvastatin, valsartan, and gabapentin.  1. Chronic radicular lumbar  pain - Lumbar Transforaminal Epidural; Future  2. Lumbar facet syndrome (Bilateral) (R>L) - gabapentin (NEURONTIN) 600 MG tablet; Take 1 tablet (600 mg total) by mouth every 8 (eight) hours.  Dispense: 90 tablet; Refill: 5 - methocarbamol (ROBAXIN) 500 MG tablet; Take 1 tablet (500 mg total) by mouth every 8 (eight) hours as needed for muscle spasms.  Dispense: 90 tablet; Refill: 5 - oxyCODONE-acetaminophen (PERCOCET) 5-325 MG tablet; Take 1 tablet by mouth every 6 (six) hours as needed for severe pain. Must last 30 days.  Dispense: 120 tablet; Refill: 0 - oxyCODONE-acetaminophen (PERCOCET) 5-325 MG tablet; Take 1 tablet by mouth every 6 (six) hours as needed for severe pain. Must last 30 days.  Dispense: 120 tablet; Refill: 0 - oxyCODONE-acetaminophen (  PERCOCET) 5-325 MG tablet; Take 1 tablet by mouth every 6 (six) hours as needed for severe pain. Must last 30 days.  Dispense: 120 tablet; Refill: 0  3. Lumbar facet arthropathy  4. [name] Syndrome (CBS) - gabapentin (NEURONTIN) 600 MG tablet; Take 1 tablet (600 mg total) by mouth every 8 (eight) hours.  Dispense: 90 tablet; Refill: 5 - methocarbamol (ROBAXIN) 500 MG tablet; Take 1 tablet (500 mg total) by mouth every 8 (eight) hours as needed for muscle spasms.  Dispense: 90 tablet; Refill: 5 - oxyCODONE-acetaminophen (PERCOCET) 5-325 MG tablet; Take 1 tablet by mouth every 6 (six) hours as needed for severe pain. Must last 30 days.  Dispense: 120 tablet; Refill: 0 - oxyCODONE-acetaminophen (PERCOCET) 5-325 MG tablet; Take 1 tablet by mouth every 6 (six) hours as needed for severe pain. Must last 30 days.  Dispense: 120 tablet; Refill: 0 - oxyCODONE-acetaminophen (PERCOCET) 5-325 MG tablet; Take 1 tablet by mouth every 6 (six) hours as needed for severe pain. Must last 30 days.  Dispense: 120 tablet; Refill: 0  5. Chronic lower extremity pain (Bilateral)  6. Sacroiliac joint pain - gabapentin (NEURONTIN) 600 MG tablet; Take 1 tablet  (600 mg total) by mouth every 8 (eight) hours.  Dispense: 90 tablet; Refill: 5 - methocarbamol (ROBAXIN) 500 MG tablet; Take 1 tablet (500 mg total) by mouth every 8 (eight) hours as needed for muscle spasms.  Dispense: 90 tablet; Refill: 5 - oxyCODONE-acetaminophen (PERCOCET) 5-325 MG tablet; Take 1 tablet by mouth every 6 (six) hours as needed for severe pain. Must last 30 days.  Dispense: 120 tablet; Refill: 0 - oxyCODONE-acetaminophen (PERCOCET) 5-325 MG tablet; Take 1 tablet by mouth every 6 (six) hours as needed for severe pain. Must last 30 days.  Dispense: 120 tablet; Refill: 0 - oxyCODONE-acetaminophen (PERCOCET) 5-325 MG tablet; Take 1 tablet by mouth every 6 (six) hours as needed for severe pain. Must last 30 days.  Dispense: 120 tablet; Refill: 0  7. Coccydynia  8. Chronic pain syndrome - gabapentin (NEURONTIN) 600 MG tablet; Take 1 tablet (600 mg total) by mouth every 8 (eight) hours.  Dispense: 90 tablet; Refill: 5 - methocarbamol (ROBAXIN) 500 MG tablet; Take 1 tablet (500 mg total) by mouth every 8 (eight) hours as needed for muscle spasms.  Dispense: 90 tablet; Refill: 5 - oxyCODONE-acetaminophen (PERCOCET) 5-325 MG tablet; Take 1 tablet by mouth every 6 (six) hours as needed for severe pain. Must last 30 days.  Dispense: 120 tablet; Refill: 0 - oxyCODONE-acetaminophen (PERCOCET) 5-325 MG tablet; Take 1 tablet by mouth every 6 (six) hours as needed for severe pain. Must last 30 days.  Dispense: 120 tablet; Refill: 0 - oxyCODONE-acetaminophen (PERCOCET) 5-325 MG tablet; Take 1 tablet by mouth every 6 (six) hours as needed for severe pain. Must last 30 days.  Dispense: 120 tablet; Refill: 0 - Lumbar Transforaminal Epidural; Future    Pharmacotherapy (Medications Ordered): Meds ordered this encounter  Medications   gabapentin (NEURONTIN) 600 MG tablet    Sig: Take 1 tablet (600 mg total) by mouth every 8 (eight) hours.    Dispense:  90 tablet    Refill:  5    Fill one day early  if pharmacy is closed on scheduled refill date. May substitute for generic if available.   methocarbamol (ROBAXIN) 500 MG tablet    Sig: Take 1 tablet (500 mg total) by mouth every 8 (eight) hours as needed for muscle spasms.    Dispense:  90 tablet  Refill:  5    Do not place this medication, or any other prescription from our practice, on "Automatic Refill". Patient may have prescription filled one day early if pharmacy is closed on scheduled refill date.   oxyCODONE-acetaminophen (PERCOCET) 5-325 MG tablet    Sig: Take 1 tablet by mouth every 6 (six) hours as needed for severe pain. Must last 30 days.    Dispense:  120 tablet    Refill:  0    Chronic Pain: STOP Act (Not applicable) Fill 1 day early if closed on refill date. Avoid benzodiazepines within 8 hours of opioids   oxyCODONE-acetaminophen (PERCOCET) 5-325 MG tablet    Sig: Take 1 tablet by mouth every 6 (six) hours as needed for severe pain. Must last 30 days.    Dispense:  120 tablet    Refill:  0    Chronic Pain: STOP Act (Not applicable) Fill 1 day early if closed on refill date. Avoid benzodiazepines within 8 hours of opioids   oxyCODONE-acetaminophen (PERCOCET) 5-325 MG tablet    Sig: Take 1 tablet by mouth every 6 (six) hours as needed for severe pain. Must last 30 days.    Dispense:  120 tablet    Refill:  0    Chronic Pain: STOP Act (Not applicable) Fill 1 day early if closed on refill date. Avoid benzodiazepines within 8 hours of opioids   Orders:  Orders Placed This Encounter  Procedures   Lumbar Transforaminal Epidural    Standing Status:   Future    Standing Expiration Date:   09/17/2022    Scheduling Instructions:     Laterality: Bilateral L4,5         Sedation: IV Versed     Timeframe: ASAP    Order Specific Question:   Where will this procedure be performed?    Answer:   [facility]   Follow-up plan:   Return in about 2 weeks (around 07/02/2022) for B/L L4, 5 TF ESI , in clinic IV Versed.      Status post bilateral intra-articular knee steroid, right elbow injection on 04/05/2019.  Status post L4-L5 ESI, right-sided along with right shoulder steroid injection on 06/16/2019, right shoulder injection on 07/28/2019, posterior approach         Recent Visits No visits were found meeting these conditions. Showing recent visits within past 90 days and meeting all other requirements Today's Visits Date Type Provider Dept  06/18/22 Office Visit Edward Jolly, MD [facility]  Showing today's visits and meeting all other requirements Future Appointments No visits were found meeting these conditions. Showing future appointments within next 90 days and meeting all other requirements  I discussed the assessment and treatment plan with the patient. The patient was provided an opportunity to ask questions and all were answered. The patient agreed with the plan and demonstrated an understanding of the instructions.  Patient advised to call back or seek an in-person evaluation if the symptoms or condition worsens.  Duration of encounter: 30 minutes.  Note by: Edward Jolly, MD Date: 06/18/2022; Time: 10:49 AM

## 2022-06-19 ENCOUNTER — Telehealth: Payer: Self-pay | Admitting: Student in an Organized Health Care Education/Training Program

## 2022-06-19 NOTE — Telephone Encounter (Signed)
Patient says her medications are not helping her pain

## 2022-06-19 NOTE — Telephone Encounter (Signed)
Spoke with patient and she states her medications are not working.  Patient had an appoinment yesterday so I asked her if she discussed this with the Dr.  She states no.  I informed her that he would not make medication changes over the phone and that was something that she would need to address this at her appointments.

## 2022-07-10 ENCOUNTER — Ambulatory Visit
Payer: Medicare PPO | Attending: Student in an Organized Health Care Education/Training Program | Admitting: Student in an Organized Health Care Education/Training Program

## 2022-07-10 ENCOUNTER — Encounter: Payer: Self-pay | Admitting: Student in an Organized Health Care Education/Training Program

## 2022-07-10 ENCOUNTER — Ambulatory Visit
Admission: RE | Admit: 2022-07-10 | Discharge: 2022-07-10 | Disposition: A | Discharge status: Home / Self Care | Payer: Medicare PPO | Source: Ambulatory Visit | Attending: Student in an Organized Health Care Education/Training Program | Admitting: Student in an Organized Health Care Education/Training Program

## 2022-07-10 DIAGNOSIS — M5416 Radiculopathy, lumbar region: Secondary | ICD-10-CM | POA: Insufficient documentation

## 2022-07-10 DIAGNOSIS — G894 Chronic pain syndrome: Secondary | ICD-10-CM | POA: Insufficient documentation

## 2022-07-10 DIAGNOSIS — G8929 Other chronic pain: Secondary | ICD-10-CM | POA: Insufficient documentation

## 2022-07-10 MED ORDER — DIAZEPAM 5 MG PO TABS
ORAL_TABLET | ORAL | Status: AC
  Filled 2022-07-10: qty 1

## 2022-07-10 MED ORDER — IOHEXOL 180 MG/ML  SOLN
10.0000 mL | Freq: Once | INTRAMUSCULAR | Status: AC
  Administered 2022-07-10: 10 mL via EPIDURAL
  Filled 2022-07-10: qty 20

## 2022-07-10 MED ORDER — LIDOCAINE HCL 2 % IJ SOLN
20.0000 mL | Freq: Once | INTRAMUSCULAR | Status: AC
  Administered 2022-07-10: 400 mg
  Filled 2022-07-10: qty 40

## 2022-07-10 MED ORDER — DEXAMETHASONE SODIUM PHOSPHATE 10 MG/ML IJ SOLN
20.0000 mg | Freq: Once | INTRAMUSCULAR | Status: AC
  Administered 2022-07-10: 20 mg
  Filled 2022-07-10: qty 2

## 2022-07-10 MED ORDER — DIAZEPAM 5 MG PO TABS
5.0000 mg | ORAL_TABLET | ORAL | Status: AC
  Administered 2022-07-10: 5 mg via ORAL

## 2022-07-10 MED ORDER — ROPIVACAINE HCL 2 MG/ML IJ SOLN
2.0000 mL | Freq: Once | INTRAMUSCULAR | Status: AC
  Administered 2022-07-10: 2 mL via EPIDURAL
  Filled 2022-07-10: qty 20

## 2022-07-10 MED ORDER — SODIUM CHLORIDE 0.9% FLUSH
2.0000 mL | Freq: Once | INTRAVENOUS | Status: AC
  Administered 2022-07-10: 2 mL

## 2022-07-10 NOTE — Progress Notes (Signed)
Safety precautions to be maintained throughout the outpatient stay will include: orient to surroundings, keep bed in low position, maintain call bell within reach at all times, provide assistance with transfer out of bed and ambulation.  

## 2022-07-10 NOTE — Progress Notes (Signed)
PROVIDER NOTE: Interpretation of information contained herein should be left to medically-trained personnel. Specific patient instructions are provided elsewhere under "Patient Instructions" section of medical record. This document was created in part using STT-dictation technology, any transcriptional errors that may result from this process are unintentional.  Patient: Sandra [name] Type: Established DOB: 1955-06-04 MRN: [id number] PCP: Gracelyn Nurse, MD  Service: Procedure DOS: 07/10/2022 Setting: Ambulatory Location: Ambulatory outpatient facility Delivery: Face-to-face Provider: Edward Jolly, MD Specialty: Interventional Pain Management Specialty designation: 09 Location: Outpatient facility Ref. Prov.: Edward Jolly, MD       Interventional Therapy   Procedure: Lumbar trans-foraminal epidural steroid injection (L-TFESI) #1  Laterality: Bilateral (-50)  Level: L4 and L5 nerve root(s) Imaging: Fluoroscopy-guided         Anesthesia: Local anesthesia (1-2% Lidocaine) Anxiolysis: 5 mg PO Valium DOS: 07/10/2022  Performed by: Edward Jolly, MD  Purpose: Diagnostic/Therapeutic Indications: Lumbar radicular pain severe enough to impact quality of life or function. 1. Chronic pain syndrome   2. Chronic radicular lumbar pain    NAS-11 Pain score:   Pre-procedure: 8 /10   Post-procedure: 8 /10     Position / Prep / Materials:  Position: Prone  Prep solution: DuraPrep (Iodine Povacrylex [0.7% available iodine] and Isopropyl Alcohol, 74% w/w) Prep Area: Entire Posterior Lumbosacral Area.  From the lower tip of the scapula down to the tailbone and from flank to flank. Materials:  Tray: Block Needle(s):  Type: Spinal  Gauge (G): 22  Length: 3.5-in  Qty: 2      Pre-op H&P Assessment:  Sandra [name] is a 67 y.o. (year old), female patient, seen today for interventional treatment. She  has a past surgical history that includes Cholecystectomy; Rectocele repair; Abdominal  hysterectomy; Appendectomy; Colonoscopy; Esophagogastroduodenoscopy; Sphincterotomy (N/A, 01/24/2017); Hemorrhoid surgery (N/A, 03/07/2017); Hernia repair; Esophagogastroduodenoscopy (egd) with propofol (N/A, 09/17/2017); Oophorectomy; and Breast biopsy (Bilateral, 1977). Sandra [name] has a current medication list which includes the following prescription(s): acetaminophen, amitriptyline, amlodipine, aspirin, gabapentin, melatonin, methocarbamol, montelukast, oxycodone-acetaminophen, [START ON 07/18/2022] oxycodone-acetaminophen, [START ON 08/17/2022] oxycodone-acetaminophen, promethazine, quetiapine, ropinirole, simvastatin, valsartan, and esomeprazole. Her primarily concern today is the Back Pain  Initial Vital Signs:  Pulse/HCG Rate: 80ECG Heart Rate: 72 Temp: (!) 97.2 F (36.2 C) Resp: 16 BP: (!) 180/83 SpO2: 100 %  BMI: Estimated body mass index is 27.21 kg/m as calculated from the following:   Height as of this encounter: 5\' 1"  (1.549 m).   Weight as of this encounter: 144 lb (65.3 kg).  Risk Assessment: Allergies: Reviewed. She is allergic to trazodone, codeine, cortisone, and zolpidem.  Allergy Precautions: None required Coagulopathies: Reviewed. None identified.  Blood-thinner therapy: None at this time Active Infection(s): Reviewed. None identified. Sandra [name] is afebrile  Site Confirmation: Sandra [name] was asked to confirm the procedure and laterality before marking the site Procedure checklist: Completed Consent: Before the procedure and under the influence of no sedative(s), amnesic(s), or anxiolytics, the patient was informed of the treatment options, risks and possible complications. To fulfill our ethical and legal obligations, as recommended by the American Medical Association's Code of Ethics, I have informed the patient of my clinical impression; the nature and purpose of the treatment or procedure; the risks, benefits, and possible complications of the intervention; the  alternatives, including doing nothing; the risk(s) and benefit(s) of the alternative treatment(s) or procedure(s); and the risk(s) and benefit(s) of doing nothing. The patient was provided information about the general risks and possible complications associated with the procedure. These may include,  but are not limited to: failure to achieve desired goals, infection, bleeding, organ or nerve damage, allergic reactions, paralysis, and death. In addition, the patient was informed of those risks and complications associated to Spine-related procedures, such as failure to decrease pain; infection (i.e.: Meningitis, epidural or intraspinal abscess); bleeding (i.e.: epidural hematoma, subarachnoid hemorrhage, or any other type of intraspinal or peri-dural bleeding); organ or nerve damage (i.e.: Any type of peripheral nerve, nerve root, or spinal cord injury) with subsequent damage to sensory, motor, and/or autonomic systems, resulting in permanent pain, numbness, and/or weakness of one or several areas of the body; allergic reactions; (i.e.: anaphylactic reaction); and/or death. Furthermore, the patient was informed of those risks and complications associated with the medications. These include, but are not limited to: allergic reactions (i.e.: anaphylactic or anaphylactoid reaction(s)); adrenal axis suppression; blood sugar elevation that in diabetics may result in ketoacidosis or comma; water retention that in patients with history of congestive heart failure may result in shortness of breath, pulmonary edema, and decompensation with resultant heart failure; weight gain; swelling or edema; medication-induced neural toxicity; particulate matter embolism and blood vessel occlusion with resultant organ, and/or nervous system infarction; and/or aseptic necrosis of one or more joints. Finally, the patient was informed that Medicine is not an exact science; therefore, there is also the possibility of unforeseen or  unpredictable risks and/or possible complications that may result in a catastrophic outcome. The patient indicated having understood very clearly. We have given the patient no guarantees and we have made no promises. Enough time was given to the patient to ask questions, all of which were answered to the patient's satisfaction. Sandra [name] has indicated that she wanted to continue with the procedure. Attestation: I, the ordering provider, attest that I have discussed with the patient the benefits, risks, side-effects, alternatives, likelihood of achieving goals, and potential problems during recovery for the procedure that I have provided informed consent. Date  Time: 07/10/2022 10:50 AM   Pre-Procedure Preparation:  Monitoring: As per clinic protocol. Respiration, ETCO2, SpO2, BP, heart rate and rhythm monitor placed and checked for adequate function Safety Precautions: Patient was assessed for positional comfort and pressure points before starting the procedure. Time-out: I initiated and conducted the "Time-out" before starting the procedure, as per protocol. The patient was asked to participate by confirming the accuracy of the "Time Out" information. Verification of the correct person, site, and procedure were performed and confirmed by me, the nursing staff, and the patient. "Time-out" conducted as per Joint Commission's Universal Protocol (UP.01.01.01). Time: 1140 Start Time: 1140 hrs.  Description/Narrative of Procedure:          Target: The 6 o'clock position under the pedicle, on the affected side. Region: Posterolateral Lumbosacral Approach: Posterior Percutaneous Paravertebral approach.  Rationale (medical necessity): procedure needed and proper for the diagnosis and/or treatment of the patient's medical symptoms and needs. Procedural Technique Safety Precautions: Aspiration looking for blood return was conducted prior to all injections. At no point did we inject any substances, as a  needle was being advanced. No attempts were made at seeking any paresthesias. Safe injection practices and needle disposal techniques used. Medications properly checked for expiration dates. SDV (single dose vial) medications used. Description of the Procedure: Protocol guidelines were followed. The patient was placed in position over the procedure table. The target area was identified and the area prepped in the usual manner. Skin & deeper tissues infiltrated with local anesthetic. Appropriate amount of time allowed to pass for local anesthetics  to take effect. The procedure needles were then advanced to the target area. Proper needle placement secured. Negative aspiration confirmed. Solution injected in intermittent fashion, asking for systemic symptoms every 0.5cc of injectate. The needles were then removed and the area cleansed, making sure to leave some of the prepping solution back to take advantage of its long term bactericidal properties.  10 cc solution made of 6 cc of preservative-free saline, 2 cc of 0.2% ropivacaine, 2  cc of Decadron 10 mg/cc. 2.5 cc injected for left L4 and left L5 nerve 2.5 cc injected for right L4 and right L5 nerve   Vitals:   07/10/22 1139 07/10/22 1144 07/10/22 1150 07/10/22 1153  BP: (!) 193/95 (!) 193/95 (!) 166/80 (!) 177/95  Pulse:      Resp: 18 16 18 16   Temp:      SpO2: 98% 98% 97% 97%  Weight:      Height:        Start Time: 1140 hrs. End Time: 1152 hrs.  Imaging Guidance (Spinal):          Type of Imaging Technique: Fluoroscopy Guidance (Spinal) Indication(s): Assistance in needle guidance and placement for procedures requiring needle placement in or near specific anatomical locations not easily accessible without such assistance. Exposure Time: Please see nurses notes. Contrast: Before injecting any contrast, we confirmed that the patient did not have an allergy to iodine, shellfish, or radiological contrast. Once satisfactory needle placement  was completed at the desired level, radiological contrast was injected. Contrast injected under live fluoroscopy. No contrast complications. See chart for type and volume of contrast used. Fluoroscopic Guidance: I was personally present during the use of fluoroscopy. "Tunnel Vision Technique" used to obtain the best possible view of the target area. Parallax error corrected before commencing the procedure. "Direction-depth-direction" technique used to introduce the needle under continuous pulsed fluoroscopy. Once target was reached, antero-posterior, oblique, and lateral fluoroscopic projection used confirm needle placement in all planes. Images permanently stored in EMR. Interpretation: I personally interpreted the imaging intraoperatively. Adequate needle placement confirmed in multiple planes. Appropriate spread of contrast into desired area was observed. No evidence of afferent or efferent intravascular uptake. No intrathecal or subarachnoid spread observed. Permanent images saved into the patient's record.  Post-operative Assessment:  Post-procedure Vital Signs:  Pulse/HCG Rate: 8075 Temp: (!) 97.2 F (36.2 C) Resp: 16 BP: (!) 177/95 SpO2: 97 %  EBL: None  Complications: No immediate post-treatment complications observed by team, or reported by patient.  Note: The patient tolerated the entire procedure well. A repeat set of vitals were taken after the procedure and the patient was kept under observation following institutional policy, for this type of procedure. Post-procedural neurological assessment was performed, showing return to baseline, prior to discharge. The patient was provided with post-procedure discharge instructions, including a section on how to identify potential problems. Should any problems arise concerning this procedure, the patient was given instructions to immediately contact us, at any time, without hesitation. In any case, we plan to contact the patient by telephone for  a follow-up status report regarding this interventional procedure.  Comments:  No additional relevant information.  Plan of Care (POC)  Orders:  Orders Placed This Encounter  Procedures   DG PAIN CLIN[facility] C-ARM 1-60 MIN NO REPORT    Intraoperative interpretation by procedural physician at [facility].    Standing Status:   Standing    Number of Occurrences:   1    Order Specific Question:  Reason for exam:    Answer:   Assistance in needle guidance and placement for procedures requiring needle placement in or near specific anatomical locations not easily accessible without such assistance.   Chronic Opioid Analgesic:  Hydrocodone 5 mg every 6 hours as needed, quantity 120/month; MME equals 20. Tramadol 100 mg ER daily for long-acting pain relief .    Medications ordered for procedure: Meds ordered this encounter  Medications   iohexol (OMNIPAQUE) 180 MG/ML injection 10 mL    Must be Myelogram-compatible. If not available, you may substitute with a water-soluble, non-ionic, hypoallergenic, myelogram-compatible radiological contrast medium.   lidocaine (XYLOCAINE) 2 % (with pres) injection 400 mg   diazepam (VALIUM) tablet 5 mg    Make sure Flumazenil is available in the pyxis when using this medication. If oversedation occurs, administer 0.2 mg IV over 15 sec. If after 45 sec no response, administer 0.2 mg again over 1 min; may repeat at 1 min intervals; not to exceed 4 doses (1 mg)   sodium chloride flush (NS) 0.9 % injection 2 mL    This is for a two (2) level block. Use two (2) syringes and divide content in half.   ropivacaine (PF) 2 mg/mL (0.2%) (NAROPIN) injection 2 mL    This is for a two (2) level block. Use two (2) syringes and divide content in half.   dexamethasone (DECADRON) injection 20 mg    This is for a two (2) level block. Use two (2) syringes and divide content in half.   Medications administered: We administered iohexol, lidocaine, diazepam, sodium  chloride flush, ropivacaine (PF) 2 mg/mL (0.2%), and dexamethasone.  See the medical record for exact dosing, route, and time of administration.  Follow-up plan:   Return for Keep sch. appt.       Status post bilateral intra-articular knee steroid, right elbow injection on 04/05/2019.  Status post L4-L5 ESI, right-sided along with right shoulder steroid injection on 06/16/2019, right shoulder injection on 07/28/2019, posterior approach; B/L L4 and L5 TF ESI          Recent Visits Date Type Provider Dept  06/18/22 Office Visit Edward Jolly, MD [facility]  Showing recent visits within past 90 days and meeting all other requirements Today's Visits Date Type Provider Dept  07/10/22 Procedure visit Edward Jolly, MD [facility]  Showing today's visits and meeting all other requirements Future Appointments Date Type Provider Dept  09/12/22 Appointment Edward Jolly, MD [facility]  Showing future appointments within next 90 days and meeting all other requirements  Disposition: Discharge home  Discharge (Date  Time): 07/10/2022; 1205 hrs.   Primary Care Physician: Gracelyn Nurse, MD Location: [facility] Note by: Edward Jolly, MD (TTS technology used. I apologize for any typographical errors that were not detected and corrected.) Date: 07/10/2022; Time: 1:04 PM  Disclaimer:  Medicine is not an Visual merchandiser. The only guarantee in medicine is that nothing is guaranteed. It is important to note that the decision to proceed with this intervention was based on the information collected from the patient. The Data and conclusions were drawn from the patient's questionnaire, the interview, and the physical examination. Because the information was provided in large part by the patient, it cannot be guaranteed that it has not been purposely or unconsciously manipulated. Every effort has been made to obtain as much relevant data as possible for  this evaluation. It is important to note that the conclusions that lead  to this procedure are derived in large part from the available data. Always take into account that the treatment will also be dependent on availability of resources and existing treatment guidelines, considered by other Pain Management Practitioners as being common knowledge and practice, at the time of the intervention. For Medico-Legal purposes, it is also important to point out that variation in procedural techniques and pharmacological choices are the acceptable norm. The indications, contraindications, technique, and results of the above procedure should only be interpreted and judged by a Board-Certified Interventional Pain Specialist with extensive familiarity and expertise in the same exact procedure and technique.

## 2022-07-10 NOTE — Patient Instructions (Signed)
Selective Nerve Root Block Patient Information  Description: Specific nerve roots exit the spinal canal and these nerves can be compressed and inflamed by a bulging disc and bone spurs.  By injecting steroids on the nerve root, we can potentially decrease the inflammation surrounding these nerves, which often leads to decreased pain.  Also, by injecting local anesthesia on the nerve root, this can provide [place] helpful information to give to your referring doctor if it decreases your pain.  Selective nerve root blocks can be done along the spine from the neck to the low back depending on the location of your pain.   After numbing the skin with local anesthesia, a small needle is passed to the nerve root and the position of the needle is verified using x-ray pictures.  After the needle is in correct position, we then deposit the medication.  You may experience a pressure sensation while this is being done.  The entire block usually lasts less than 15 minutes.  Conditions that may be treated with selective nerve root blocks: Low back and leg pain Spinal stenosis Diagnostic block prior to potential surgery Neck and arm pain Post laminectomy syndrome  Preparation for the injection:  Do not eat any solid food or dairy products within 8 hours of your appointment. You may drink clear liquids up to 3 hours before an appointment.  Clear liquids include water, black coffee, juice or soda.  No milk or cream please. You may take your regular medications, including pain medications, with a sip of water before your appointment.  Diabetics should hold regular insulin (if taken separately) and take 1/2 normal NPH dose the morning of the procedure.  Carry some sugar containing items with you to your appointment. A driver must accompany you and be prepared to drive you home after your procedure. Bring all your current medications with you. An IV may be inserted and sedation may be given at the discretion of the  physician. A blood pressure cuff, EKG, and other monitors will often be applied during the procedure.  Some patients may need to have extra oxygen administered for a short period. You will be asked to provide medical information, including allergies, prior to the procedure.  We must know immediately if you are taking blood  Thinners (like Coumadin) or if you are allergic to IV iodine contrast (dye).  Possible side-effects: All are usually temporary Bleeding from needle site Light headedness Numbness and tingling Decreased blood pressure Weakness in arms/legs Pressure sensation in back/neck Pain at injection site (several days)  Possible complications: All are extremely rare Infection Nerve injury Spinal headache (a headache wore with upright position)  Call if you experience: Fever/chills associated with headache or increased back/neck pain Headache worsened by an upright position New onset weakness or numbness of an extremity below the injection site Hives or difficulty breathing (go to the emergency room) Inflammation or drainage at the injection site(s) Severe back/neck pain greater than usual New symptoms which are concerning to you  Please note:  Although the local anesthetic injected can often make your back or neck feel good for several hours after the injection the pain will likely return.  It takes 3-5 days for steroids to work on the nerve root. You may not notice any pain relief for at least one week.  If effective, we will often do a series of 3 injections spaced 3-6 weeks apart to maximally decrease your pain.    If you have any questions, please call [phone] [place]  [facility]  General Discharge Instructions :  If you need to reach your doctor call: Monday-Friday 8:00 am - 4:00 pm at [phone] or toll free [phone].  After clinic hours [phone] to have operator reach  doctor.  Bring all of your medication bottles to all your appointments in the pain clinic.  To cancel or reschedule your appointment with Pain Management please remember to call 24 hours in advance to avoid a fee.  Refer to the educational materials which you have been given on: General Risks, I had my Procedure. Discharge Instructions, Post Sedation.  Post Procedure Instructions:  The drugs you were given will stay in your system until tomorrow, so for the next 24 hours you should not drive, make any legal decisions or drink any alcoholic beverages.  You may eat anything you prefer, but it is better to start with liquids then soups and crackers, and gradually work up to solid foods.  Please notify your doctor immediately if you have any unusual bleeding, trouble breathing or pain that is not related to your normal pain.  Depending on the type of procedure that was done, some parts of your body may feel week and/or numb.  This usually clears up by tonight or the next day.  Walk with the use of an assistive device or accompanied by an adult for the 24 hours.  You may use ice on the affected area for the first 24 hours.  Put ice in a Ziploc bag and cover with a towel and place against area 15 minutes on 15 minutes off.  You may switch to heat after 24 hours.Selective Nerve Root Block Patient Information  Description: Specific nerve roots exit the spinal canal and these nerves can be compressed and inflamed by a bulging disc and bone spurs.  By injecting steroids on the nerve root, we can potentially decrease the inflammation surrounding these nerves, which often leads to decreased pain.  Also, by injecting local anesthesia on the nerve root, this can provide [place] helpful information to give to your referring doctor if it decreases your pain.  Selective nerve root blocks can be done along the spine from the neck to the low back depending on the location of your pain.   After numbing the skin with  local anesthesia, a small needle is passed to the nerve root and the position of the needle is verified using x-ray pictures.  After the needle is in correct position, we then deposit the medication.  You may experience a pressure sensation while this is being done.  The entire block usually lasts less than 15 minutes.  Conditions that may be treated with selective nerve root blocks: Low back and leg pain Spinal stenosis Diagnostic block prior to potential surgery Neck and arm pain Post laminectomy syndrome  Preparation for the injection:  Do not eat any solid food or dairy products within 8 hours of your appointment. You may drink clear liquids up to 3 hours before an appointment.  Clear liquids include water, black coffee, juice or soda.  No milk or cream please. You may take your regular medications, including pain medications, with a sip of water before your appointment.  Diabetics should hold regular insulin (if taken separately) and take 1/2 normal NPH dose the morning of the procedure.  Carry some sugar containing items with you to your appointment. A driver must accompany you and be prepared to drive you home after your procedure. Bring all your current  medications with you. An IV may be inserted and sedation may be given at the discretion of the physician. A blood pressure cuff, EKG, and other monitors will often be applied during the procedure.  Some patients may need to have extra oxygen administered for a short period. You will be asked to provide medical information, including allergies, prior to the procedure.  We must know immediately if you are taking blood  Thinners (like Coumadin) or if you are allergic to IV iodine contrast (dye).  Possible side-effects: All are usually temporary Bleeding from needle site Light headedness Numbness and tingling Decreased blood pressure Weakness in arms/legs Pressure sensation in back/neck Pain at injection site (several  days)  Possible complications: All are extremely rare Infection Nerve injury Spinal headache (a headache wore with upright position)  Call if you experience: Fever/chills associated with headache or increased back/neck pain Headache worsened by an upright position New onset weakness or numbness of an extremity below the injection site Hives or difficulty breathing (go to the emergency room) Inflammation or drainage at the injection site(s) Severe back/neck pain greater than usual New symptoms which are concerning to you  Please note:  Although the local anesthetic injected can often make your back or neck feel good for several hours after the injection the pain will likely return.  It takes 3-5 days for steroids to work on the nerve root. You may not notice any pain relief for at least one week.  If effective, we will often do a series of 3 injections spaced 3-6 weeks apart to maximally decrease your pain.    If you have any questions, please call [phone] [facility]

## 2022-07-11 ENCOUNTER — Telehealth: Payer: Self-pay | Admitting: Student in an Organized Health Care Education/Training Program

## 2022-07-11 ENCOUNTER — Ambulatory Visit: Payer: Medicare PPO | Admitting: Student in an Organized Health Care Education/Training Program

## 2022-07-11 ENCOUNTER — Telehealth: Payer: Self-pay

## 2022-07-11 NOTE — Telephone Encounter (Signed)
Notified patient that it has only been one day since her procedure and that she needed to give the steroid a little time to start working.  Informed her that she has a muscle relaxer ordered, the Methocarbamol.  States she will take it.

## 2022-07-11 NOTE — Telephone Encounter (Signed)
Post procedure follow up.  Voicemail has not been set up.  Unable to leave message.  

## 2022-07-11 NOTE — Telephone Encounter (Signed)
PT called stated that she has been in so much pain since the procedure on 07/10/22. PT wanted to see if Lateef can stop her muscle relaxer. Please give patient a call. TY

## 2022-07-18 ENCOUNTER — Telehealth: Payer: Self-pay | Admitting: Student in an Organized Health Care Education/Training Program

## 2022-07-18 NOTE — Telephone Encounter (Signed)
Called to [facility] and they report that patient has already picked up her Rx that was in question.

## 2022-07-18 NOTE — Telephone Encounter (Signed)
Calling pharmacy to check on Rx

## 2022-07-18 NOTE — Telephone Encounter (Signed)
PT stated that when she call pharmacy to get Oxycodone , PT stated that she was told by pharmacy tech.That no prescription had been send it. PT stated that she is supposed to be picking up  prescription on today. Please give patient a call once prescription has been send in. TY

## 2022-08-12 ENCOUNTER — Telehealth: Payer: Self-pay | Admitting: Student in an Organized Health Care Education/Training Program

## 2022-08-12 NOTE — Telephone Encounter (Signed)
PT called stated that she is having withdrawals and she needs something to take to go along with the oxycodone. PT stated that she hasn't been able to sleep over the week and she has been dealing with this about a week now. PT stated that her pain has increase. PT give patient a call. TY

## 2022-08-12 NOTE — Telephone Encounter (Signed)
Patient states she has been taking more that prescribed. I reminded her that the Medication Agreement that she signed states that medications must be taken as prescribed, and there will be no early fills. I informed her that next fill may be done on 08-17-22.

## 2022-08-16 ENCOUNTER — Telehealth: Payer: Self-pay

## 2022-08-16 NOTE — Telephone Encounter (Signed)
The patient left a vm stating she is hurting in her bottom, she has a hemorrhoid problem and diarrhea and her bottom is very irritated. She wants to see if Dr. Cherylann Ratel will call the drug store and let her fill 3 percocet and 3 muscle relaxer's. She took the last of her percocet yesterday. She wants you to call her back and let her know today before we close for the weekend.

## 2022-08-28 ENCOUNTER — Other Ambulatory Visit: Payer: Self-pay | Admitting: Surgery

## 2022-08-28 DIAGNOSIS — K6289 Other specified diseases of anus and rectum: Secondary | ICD-10-CM

## 2022-09-03 ENCOUNTER — Ambulatory Visit
Admission: RE | Admit: 2022-09-03 | Discharge: 2022-09-03 | Disposition: A | Discharge status: Home / Self Care | Payer: Medicare PPO | Source: Ambulatory Visit | Attending: Surgery | Admitting: Surgery

## 2022-09-03 DIAGNOSIS — K6289 Other specified diseases of anus and rectum: Secondary | ICD-10-CM | POA: Insufficient documentation

## 2022-09-03 DIAGNOSIS — G8929 Other chronic pain: Secondary | ICD-10-CM | POA: Diagnosis present

## 2022-09-03 MED ORDER — GADOBUTROL 1 MMOL/ML IV SOLN
6.0000 mL | Freq: Once | INTRAVENOUS | Status: AC | PRN
  Administered 2022-09-03: 6 mL via INTRAVENOUS

## 2022-09-12 ENCOUNTER — Ambulatory Visit
Payer: Medicare PPO | Attending: Student in an Organized Health Care Education/Training Program | Admitting: Student in an Organized Health Care Education/Training Program

## 2022-09-12 ENCOUNTER — Encounter: Payer: Self-pay | Admitting: Student in an Organized Health Care Education/Training Program

## 2022-09-12 VITALS — BP 160/84 | HR 72 | Temp 97.7°F | Resp 17 | Ht 61.0 in | Wt 145.0 lb

## 2022-09-12 DIAGNOSIS — G8929 Other chronic pain: Secondary | ICD-10-CM

## 2022-09-12 DIAGNOSIS — G894 Chronic pain syndrome: Secondary | ICD-10-CM | POA: Diagnosis not present

## 2022-09-12 DIAGNOSIS — M47816 Spondylosis without myelopathy or radiculopathy, lumbar region: Secondary | ICD-10-CM | POA: Diagnosis not present

## 2022-09-12 DIAGNOSIS — H5316 Psychophysical visual disturbances: Secondary | ICD-10-CM

## 2022-09-12 DIAGNOSIS — M533 Sacrococcygeal disorders, not elsewhere classified: Secondary | ICD-10-CM

## 2022-09-12 DIAGNOSIS — M5416 Radiculopathy, lumbar region: Secondary | ICD-10-CM | POA: Diagnosis not present

## 2022-09-12 MED ORDER — OXYCODONE-ACETAMINOPHEN 5-325 MG PO TABS
1.0000 | ORAL_TABLET | Freq: Four times a day (QID) | ORAL | 0 refills | Status: AC | PRN
Start: 2022-09-15 — End: 2022-10-15

## 2022-09-12 MED ORDER — OXYCODONE-ACETAMINOPHEN 5-325 MG PO TABS
1.0000 | ORAL_TABLET | Freq: Four times a day (QID) | ORAL | 0 refills | Status: DC | PRN
Start: 2022-10-15 — End: 2022-11-14

## 2022-09-12 MED ORDER — OXYCODONE-ACETAMINOPHEN 5-325 MG PO TABS
1.0000 | ORAL_TABLET | Freq: Four times a day (QID) | ORAL | 0 refills | Status: DC | PRN
Start: 2022-11-14 — End: 2022-11-14

## 2022-09-12 NOTE — Progress Notes (Signed)
Nursing Pain Medication Assessment:  Safety precautions to be maintained throughout the outpatient stay will include: orient to surroundings, keep bed in low position, maintain call bell within reach at all times, provide assistance with transfer out of bed and ambulation.  Medication Inspection Compliance: Pill count conducted under aseptic conditions, in front of the patient. Neither the pills nor the bottle was removed from the patient's sight at any time. Once count was completed pills were immediately returned to the patient in their original bottle.  Medication: Oxycodone/APAP Pill/Patch Count:  13 of 120 pills remain Pill/Patch Appearance: Markings consistent with prescribed medication Bottle Appearance: Standard pharmacy container. Clearly labeled. Filled Date: 6 / 5 / 2024 Last Medication intake:  Today

## 2022-09-12 NOTE — Progress Notes (Signed)
PROVIDER NOTE: Information contained herein reflects review and annotations entered in association with encounter. Interpretation of such information and data should be left to medically-trained personnel. Information provided to patient can be located elsewhere in the medical record under "Patient Instructions". Document created using STT-dictation technology, any transcriptional errors that may result from process are unintentional.    Patient: Sandra [name]  Service Category: E/M  Provider: Edward Jolly, MD  DOB: 10-19-1955  DOS: 09/12/2022  Referring Provider: Gracelyn Nurse, MD  MRN: [id number]  Specialty: Interventional Pain Management  PCP: Gracelyn Nurse, MD  Type: Established Patient  Setting: Ambulatory outpatient    Location: Office  Delivery: Face-to-face     HPI  Sandra [name], a 67 y.o. year old female, is here today because of her Lumbar facet arthropathy [M47.816]. Sandra [name]'s primary complain today is Back Pain  Pertinent problems: Sandra [name] has Long term current use of opiate analgesic; Long term prescription opiate use; Opiate use; Hallucinations, visual; Psychiatric disorder; Chronic low back pain (Location of Primary Source of Pain) (Bilateral) (R>L); Chronic bilateral knee pain; Opiate dependence (HCC); Lumbar facet arthropathy; Lumbar spondylosis; Lumbar facet syndrome (Bilateral) (R>L); Diffuse myofascial pain syndrome; Sandra Ralphs Syndrome (CBS); Arthropathy of right elbow; and Localized osteoarthritis of right shoulder on their pertinent problem list. Pain Assessment: Severity of Chronic pain is reported as a 8 /10. Location: Back Lower/down legs to toes bilat. Onset: More than a month ago. Quality: Aching, Sharp. Timing: Constant. Modifying factor(s): meds. Vitals:  height is 5\' 1"  (1.549 m) and weight is 145 lb (65.8 kg). Her temporal temperature is 97.7 F (36.5 C). Her blood pressure is 160/84 (abnormal) and her pulse is 72. Her respiration is 17 and  oxygen saturation is 97%.  BMI: Estimated body mass index is 27.4 kg/m as calculated from the following:   Height as of this encounter: 5\' 1"  (1.549 m).   Weight as of this encounter: 145 lb (65.8 kg). Last encounter: 06/18/2022. Last procedure: 07/10/2022.  Reason for encounter: medication management and worsening low back pain  Presents today with increased low back pain that is worse with lumbar extension.  She states that her medications are becoming less helpful. She states that she is having difficulty performing ADLs given her low back pain.  She states that she was doing well after her lumbar transforaminal epidural steroid injection until the day after when she tripped and fell.  She states that has set her back.  Pharmacotherapy Assessment  Analgesic: Oxycodone 5 mg every 6 hours as needed, quantity 120/month; MME equals 30.    Monitoring: [place] PMP: PDMP reviewed during this encounter.       Pharmacotherapy: No side-effects or adverse reactions reported. Compliance: No problems identified. Effectiveness: Clinically acceptable.  Nonah Mattes, RN  09/12/2022  2:06 PM  Sign when Signing Visit Nursing Pain Medication Assessment:  Safety precautions to be maintained throughout the outpatient stay will include: orient to surroundings, keep bed in low position, maintain call bell within reach at all times, provide assistance with transfer out of bed and ambulation.  Medication Inspection Compliance: Pill count conducted under aseptic conditions, in front of the patient. Neither the pills nor the bottle was removed from the patient's sight at any time. Once count was completed pills were immediately returned to the patient in their original bottle.  Medication: Oxycodone/APAP Pill/Patch Count:  13 of 120 pills remain Pill/Patch Appearance: Markings consistent with prescribed medication Bottle Appearance: Standard pharmacy container. Clearly labeled. Filled Date: 6 /  28 / 2024 Last  Medication intake:  Today    No results found for: "CBDTHCR" No results found for: "D8THCCBX" No results found for: "D9THCCBX"  UDS:  Summary  Date Value Ref Range Status  03/07/2022 Note  Final    Comment:    ==================================================================== [employer] 13 (MW) ==================================================================== Test                             Result       Flag       Units  Drug Present and Declared for Prescription Verification   Hydrocodone                    3984         EXPECTED   ng/mg creat   Hydromorphone                  412          EXPECTED   ng/mg creat   Dihydrocodeine                 435          EXPECTED   ng/mg creat   Norhydrocodone                 >3030        EXPECTED   ng/mg creat    Sources of hydrocodone include scheduled prescription medications.    Hydromorphone, dihydrocodeine and norhydrocodone are expected    metabolites of hydrocodone. Hydromorphone and dihydrocodeine are    also available as scheduled prescription medications.  ==================================================================== Test                      Result    Flag   Units      Ref Range   Creatinine              165              mg/dL      >=08 ==================================================================== Declared Medications:  The flagging and interpretation on this report are based on the  following declared medications.  Unexpected results may arise from  inaccuracies in the declared medications.   **Note: The testing scope of this panel includes these medications:   Hydrocodone (Norco)   **Note: The testing scope of this panel does not include the  following reported medications:   Acetaminophen (Tylenol)  Acetaminophen (Norco)  Albuterol (Ventolin HFA)  Amitriptyline (Elavil)  Amlodipine (Norvasc)  Aspirin  Darbepoetin Alfa  Esomeprazole (Nexium)  Fluticasone (Flonase)  Gabapentin (Neurontin)   Melatonin  Methocarbamol (Robaxin)  Montelukast (Singulair)  Promethazine (Phenergan)  Quetiapine (Seroquel)  Simvastatin (Zocor)  Valsartan (Diovan) ==================================================================== For clinical consultation, please call [phone]. ====================================================================       ROS  Constitutional: Denies any fever or chills Gastrointestinal: No reported hemesis, hematochezia, vomiting, or acute GI distress Musculoskeletal:  Low back pain Neurological: No reported episodes of acute onset apraxia, aphasia, dysarthria, agnosia, amnesia, paralysis, loss of coordination, or loss of consciousness  Medication Review  Melatonin, QUEtiapine, acetaminophen, amLODipine, amitriptyline, aspirin, esomeprazole, gabapentin, methocarbamol, montelukast, oxyCODONE-acetaminophen, promethazine, rOPINIRole, simvastatin, and valsartan  History Review  Allergy: Sandra [name] is allergic to trazodone, codeine, cortisone, and zolpidem. Drug: Sandra [name]  reports no history of drug use. Alcohol:  reports no history of alcohol use. Tobacco:  reports that she quit smoking about 49 years ago. Her smoking use included  cigarettes. She has never used smokeless tobacco. Social: Sandra [name]  reports that she quit smoking about 49 years ago. Her smoking use included cigarettes. She has never used smokeless tobacco. She reports that she does not drink alcohol and does not use drugs. Medical:  has a past medical history of Anemia, Anxiety (08/01/2014), Arthritis, degenerative (07/30/2013), Clinical depression (06/30/2014), Depression, Elevated liver enzymes, GERD (gastroesophageal reflux disease), H/O breast biopsy (01/05/2015), H/O: attempted suicide (2013), Hepatitis B, Hiatal hernia, History of hysterectomy (01/05/2015), History of peptic ulcer disease (01/05/2015), Hypercholesteremia, Hypertension, Legally blind, Panic attacks, PUD (peptic ulcer  disease), Rectocele (01/05/2015), Retinitis pigmentosa, and S/P cholecystectomy (01/05/2015). Surgical: Sandra [name]  has a past surgical history that includes Cholecystectomy; Rectocele repair; Abdominal hysterectomy; Appendectomy; Colonoscopy; Esophagogastroduodenoscopy; Sphincterotomy (N/A, 01/24/2017); Hemorrhoid surgery (N/A, 03/07/2017); Hernia repair; Esophagogastroduodenoscopy (egd) with propofol (N/A, 09/17/2017); Oophorectomy; and Breast biopsy (Bilateral, 1977). Family: family history includes Bone cancer in her maternal uncle; Breast cancer in her sister; Breast cancer (age of onset: 33) in her mother; Cancer in her father; Dementia in her mother; Diabetes in her mother; Hypertension in her mother; Kidney cancer in her sister; Leukemia in her paternal grandfather and paternal grandmother; Pancreatic cancer in her maternal aunt; Prostate cancer (age of onset: 41) in her father.  Laboratory Chemistry Profile   Renal Lab Results  Component Value Date   BUN 15 02/22/2022   CREATININE 0.65 02/22/2022   GFRAA >60 01/16/2017   GFRNONAA >60 02/22/2022    Hepatic Lab Results  Component Value Date   AST 29 02/22/2022   ALT 28 02/22/2022   ALBUMIN 4.9 02/22/2022   ALKPHOS 60 02/22/2022   HCVAB NON REACTIVE 06/26/2021   LIPASE 38 02/22/2022   AMMONIA 31 09/28/2020    Electrolytes Lab Results  Component Value Date   NA 140 02/22/2022   K 3.5 02/22/2022   CL 104 02/22/2022   CALCIUM 9.4 02/22/2022   MG 2.0 09/27/2020    Bone Lab Results  Component Value Date   25OHVITD1 19 (L) 02/07/2015   25OHVITD2 3.9 02/07/2015   25OHVITD3 15 02/07/2015    Inflammation (CRP: Acute Phase) (ESR: Chronic Phase) Lab Results  Component Value Date   CRP 1.3 (H) 09/27/2020   ESRSEDRATE 26 02/07/2015   LATICACIDVEN 1.1 09/27/2020         Note: Above Lab results reviewed.  Recent Imaging Review  MR PELVIS W WO CONTRAST CLINICAL DATA:  Sharp pelvic pain with bowel movements, history of anal  fissure  EXAM: MRI PELVIS WITHOUT AND WITH CONTRAST  TECHNIQUE: Multiplanar multisequence MR imaging of the pelvis was performed both before and after administration of intravenous contrast.  CONTRAST:  6mL GADAVIST GADOBUTROL 1 MMOL/ML IV SOLN  COMPARISON:  None Available.  FINDINGS: Urinary Tract:  Bladder is underdistended but unremarkable.  Bowel: Ahaustral appearance of the rectosigmoid colon. However, there is no convincing wall thickening or inflammatory changes. No evidence of anorectal fistula or abscess.  Vascular/Lymphatic: No evidence of aneurysm.  No suspicious pelvic lymphadenopathy.  Reproductive:  Status post hysterectomy.  No adnexal masses.  Other:  No pelvic ascites.  Musculoskeletal: No focal osseous lesions.  IMPRESSION: No evidence of anorectal fistula or abscess.  Ahaustral appearance of the rectosigmoid colon, nonspecific. Inflammatory bowel disease such as ulcerative colitis is possible but not favored given the lack of inflammatory changes. Regardless, consider endoscopic evaluation as clinically warranted.  Electronically Signed   By: Charline Bills M.D.   On: 09/08/2022 02:07   Narrative & Impression  CLINICAL DATA:  Chronic low back pain   EXAM: LUMBAR SPINE - COMPLETE WITH BENDING VIEWS   COMPARISON:  06/05/2019   FINDINGS: There is no evidence of lumbar spine fracture. Alignment is normal. Intervertebral disc spaces are maintained. No significant change in alignment between flexion extension positioning limits assessment for ligamentous instability.  Facet hypertrophy noted at L3-L4, L4-L5 degenerative disc disease present.   IMPRESSION: 1. No acute fracture or listhesis.   Note: Reviewed        Physical Exam  General appearance: Well nourished, well developed, and well hydrated. In no apparent acute distress Mental status: Alert, oriented x 3 (person, place, & time)       Respiratory: No evidence of acute  respiratory distress Eyes: PERLA Vitals: BP (!) 160/84   Pulse 72   Temp 97.7 F (36.5 C) (Temporal)   Resp 17   Ht 5\' 1"  (1.549 m)   Wt 145 lb (65.8 kg)   SpO2 97%   BMI 27.40 kg/m  BMI: Estimated body mass index is 27.4 kg/m as calculated from the following:   Height as of this encounter: 5\' 1"  (1.549 m).   Weight as of this encounter: 145 lb (65.8 kg). Ideal: Ideal body weight: 47.8 kg (105 lb 6.1 oz) Adjusted ideal body weight: 55 kg (121 lb 3.7 oz)   Thoracic Spine Area Exam  Skin & Axial Inspection: No masses, redness, or swelling Alignment: Symmetrical Functional ROM: Unrestricted ROM Stability: No instability detected Muscle Tone/Strength: Functionally intact. No obvious neuro-muscular anomalies detected. Sensory (Neurological): Unimpaired Muscle strength & Tone: No palpable anomalies  Lumbar Spine Area Exam  Skin & Axial Inspection: No masses, redness, or swelling Alignment: Symmetrical Functional ROM: Pain restricted ROM affecting both sides Stability: No instability detected Muscle Tone/Strength: Functionally intact. No obvious neuro-muscular anomalies detected. Sensory (Neurological): Musculoskeletal pain pattern Palpation: Complains of area being tender to palpation       Provocative Tests: Hyperextension/rotation test: (+) bilaterally for facet joint pain. Lumbar quadrant test (Kemp's test): (+) bilaterally for facet joint pain.  Lower Extremity Exam    Side: Right lower extremity  Side: Left lower extremity  Stability: No instability observed          Stability: No instability observed          Skin & Extremity Inspection: Skin color, temperature, and hair growth are WNL. No peripheral edema or cyanosis. No masses, redness, swelling, asymmetry, or associated skin lesions. No contractures.  Skin & Extremity Inspection: Skin color, temperature, and hair growth are WNL. No peripheral edema or cyanosis. No masses, redness, swelling, asymmetry, or associated skin  lesions. No contractures.  Functional ROM: Pain restricted ROM for knee joint          Functional ROM: Pain restricted ROM for knee joint          Muscle Tone/Strength: Functionally intact. No obvious neuro-muscular anomalies detected.  Muscle Tone/Strength: Functionally intact. No obvious neuro-muscular anomalies detected.  Sensory (Neurological): Unimpaired        Sensory (Neurological): Unimpaired        DTR: Patellar: deferred today Achilles: deferred today Plantar: deferred today  DTR: Patellar: deferred today Achilles: deferred today Plantar: deferred today  Palpation: No palpable anomalies  Palpation: No palpable anomalies    Assessment   Diagnosis Status  1. Lumbar facet arthropathy   2. Lumbar facet syndrome (Bilateral) (R>L)   3. Coccydynia   4. Chronic radicular lumbar pain   5. Chronic pain syndrome   6. Sandra Most  Bonnet Syndrome (CBS)   7. Sacroiliac joint pain    Having a Flare-up Having a Flare-up Controlled    Plan of Care  Sandra [name] has a history of greater than 3 months of moderate to severe pain which has resulted in functional impairment.  The patient has tried various conservative therapeutic options such as NSAIDs, Tylenol, muscle relaxants, physical therapy which was inadequately effective.  Patient's pain is predominantly axial with physical exam and imaging findings suggestive of facet arthropathy. Lumbar facet medial branch nerve blocks were discussed with the patient.  Risks and benefits were reviewed.  Patient would like to proceed with bilateral L3, L4, L5 medial branch nerve block.    Pharmacotherapy (Medications Ordered): Meds ordered this encounter  Medications   oxyCODONE-acetaminophen (PERCOCET) 5-325 MG tablet    Sig: Take 1 tablet by mouth every 6 (six) hours as needed for severe pain. Must last 30 days.    Dispense:  120 tablet    Refill:  0    Chronic Pain: STOP Act (Not applicable) Fill 1 day early if closed on refill date. Avoid  benzodiazepines within 8 hours of opioids   oxyCODONE-acetaminophen (PERCOCET) 5-325 MG tablet    Sig: Take 1 tablet by mouth every 6 (six) hours as needed for severe pain. Must last 30 days.    Dispense:  120 tablet    Refill:  0    Chronic Pain: STOP Act (Not applicable) Fill 1 day early if closed on refill date. Avoid benzodiazepines within 8 hours of opioids   oxyCODONE-acetaminophen (PERCOCET) 5-325 MG tablet    Sig: Take 1 tablet by mouth every 6 (six) hours as needed for severe pain. Must last 30 days.    Dispense:  120 tablet    Refill:  0    Chronic Pain: STOP Act (Not applicable) Fill 1 day early if closed on refill date. Avoid benzodiazepines within 8 hours of opioids   Orders:  Orders Placed This Encounter  Procedures   LUMBAR FACET(MEDIAL BRANCH NERVE BLOCK) MBNB    Standing Status:   Future    Standing Expiration Date:   12/13/2022    Scheduling Instructions:     Procedure: Lumbar facet block (AKA.: Lumbosacral medial branch nerve block)     Side: Bilateral     Level: L3-4, L4-5,Facets ( L3, L4, L5,Medial Branch)     Sedation: with IV Versed     Timeframe: ASAA    Order Specific Question:   Where will this procedure be performed?    Answer:   [facility]   Follow-up plan:   Return in about 13 days (around 09/25/2022) for B/L L3, 4, 5 MBNB #1, in clinic NS.      Status post bilateral intra-articular knee steroid, right elbow injection on 04/05/2019.  Status post L4-L5 ESI, right-sided along with right shoulder steroid injection on 06/16/2019, right shoulder injection on 07/28/2019, posterior approach; B/L L4 and L5 TF ESI           Recent Visits Date Type Provider Dept  07/10/22 Procedure visit Edward Jolly, MD [facility]  06/18/22 Office Visit Edward Jolly, MD [facility]  Showing recent visits within past 90 days and meeting all other requirements Today's Visits Date Type Provider Dept  09/12/22 Office Visit Edward Jolly, MD  [facility]  Showing today's visits and meeting all other requirements Future Appointments Date Type Provider Dept  12/10/22 Appointment Edward Jolly, MD [facility]  Showing future appointments within  next 90 days and meeting all other requirements  I discussed the assessment and treatment plan with the patient. The patient was provided an opportunity to ask questions and all were answered. The patient agreed with the plan and demonstrated an understanding of the instructions.  Patient advised to call back or seek an in-person evaluation if the symptoms or condition worsens.  Duration of encounter: .  Total time on encounter, as per AMA guidelines included both the face-to-face and non-face-to-face time personally spent by the physician and/or other qualified health care professional(s) on the day of the encounter (includes time in activities that require the physician or other qualified health care professional and does not include time in activities normally performed by clinical staff). Physician's time may include the following activities when performed: Preparing to see the patient (e.g., pre-charting review of records, searching for previously ordered imaging, lab work, and nerve conduction tests) Review of prior analgesic pharmacotherapies. Reviewing PMP Interpreting ordered tests (e.g., lab work, imaging, nerve conduction tests) Performing post-procedure evaluations, including interpretation of diagnostic procedures Obtaining and/or reviewing separately obtained history Performing a medically appropriate examination and/or evaluation Counseling and educating the patient/family/caregiver Ordering medications, tests, or procedures Referring and communicating with other health care professionals (when not separately reported) Documenting clinical information in the electronic or other health record Independently interpreting results (not separately  reported) and communicating results to the patient/ family/caregiver Care coordination (not separately reported)  Note by: Edward Jolly, MD Date: 09/12/2022; Time: 4:07 PM

## 2022-09-12 NOTE — Patient Instructions (Signed)

## 2022-09-25 ENCOUNTER — Ambulatory Visit
Payer: Medicare PPO | Attending: Student in an Organized Health Care Education/Training Program | Admitting: Student in an Organized Health Care Education/Training Program

## 2022-10-01 ENCOUNTER — Ambulatory Visit
Payer: Medicare PPO | Attending: Pain Medicine | Admitting: Student in an Organized Health Care Education/Training Program

## 2022-10-01 ENCOUNTER — Ambulatory Visit
Admission: RE | Admit: 2022-10-01 | Discharge: 2022-10-01 | Disposition: A | Discharge status: Home / Self Care | Payer: Medicare PPO | Source: Ambulatory Visit | Attending: Student in an Organized Health Care Education/Training Program | Admitting: Student in an Organized Health Care Education/Training Program

## 2022-10-01 ENCOUNTER — Encounter: Payer: Self-pay | Admitting: Student in an Organized Health Care Education/Training Program

## 2022-10-01 VITALS — BP 145/78 | HR 69 | Temp 97.3°F | Resp 15 | Ht 61.0 in | Wt 144.0 lb

## 2022-10-01 DIAGNOSIS — M47816 Spondylosis without myelopathy or radiculopathy, lumbar region: Secondary | ICD-10-CM | POA: Insufficient documentation

## 2022-10-01 MED ORDER — DEXAMETHASONE SODIUM PHOSPHATE 10 MG/ML IJ SOLN
10.0000 mg | Freq: Once | INTRAMUSCULAR | Status: AC
  Administered 2022-10-01: 10 mg
  Filled 2022-10-01: qty 1

## 2022-10-01 MED ORDER — ROPIVACAINE HCL 2 MG/ML IJ SOLN
9.0000 mL | Freq: Once | INTRAMUSCULAR | Status: AC
  Administered 2022-10-01: 9 mL via PERINEURAL
  Filled 2022-10-01: qty 20

## 2022-10-01 MED ORDER — LIDOCAINE HCL 2 % IJ SOLN
20.0000 mL | Freq: Once | INTRAMUSCULAR | Status: AC
  Administered 2022-10-01: 400 mg
  Filled 2022-10-01: qty 20

## 2022-10-01 MED ORDER — ROPIVACAINE HCL 2 MG/ML IJ SOLN
9.0000 mL | Freq: Once | INTRAMUSCULAR | Status: AC
  Administered 2022-10-01: 9 mL via PERINEURAL

## 2022-10-01 NOTE — Patient Instructions (Signed)

## 2022-10-01 NOTE — Progress Notes (Signed)
Safety precautions to be maintained throughout the outpatient stay will include: orient to surroundings, keep bed in low position, maintain call bell within reach at all times, provide assistance with transfer out of bed and ambulation.  

## 2022-10-01 NOTE — Progress Notes (Signed)
PROVIDER NOTE: Interpretation of information contained herein should be left to medically-trained personnel. Specific patient instructions are provided elsewhere under "Patient Instructions" section of medical record. This document was created in part using STT-dictation technology, any transcriptional errors that may result from this process are unintentional.  Patient: Sandra [name] Type: Established DOB: May 15, 1955 MRN: [id number] PCP: Gracelyn Nurse, MD  Service: Procedure DOS: 10/01/2022 Setting: Ambulatory Location: Ambulatory outpatient facility Delivery: Face-to-face Provider: Edward Jolly, MD Specialty: Interventional Pain Management Specialty designation: 09 Location: Outpatient facility Ref. Prov.: Gracelyn Nurse, MD       Interventional Therapy   Procedure: Lumbar Facet, Medial Branch Block(s) #1  Laterality: Bilateral  Level: L3, L4, and L5 Medial Branch Level(s). Injecting these levels blocks the L3-4 and L4-5 lumbar facet joints. Imaging: Fluoroscopic guidance         Anesthesia: Local anesthesia (1-2% Lidocaine) DOS: 10/01/2022 Performed by: Edward Jolly, MD  Primary Purpose: Diagnostic/Therapeutic Indications: Low back pain severe enough to impact quality of life or function. 1. Lumbar facet arthropathy   2. Lumbar facet syndrome (Bilateral) (R>L)    NAS-11 Pain score:   Pre-procedure: 10-Worst pain ever/10   Post-procedure: 0-No pain/10     Position / Prep / Materials:  Position: Prone  Prep solution: DuraPrep (Iodine Povacrylex [0.7% available iodine] and Isopropyl Alcohol, 74% w/w) Area Prepped: Posterolateral Lumbosacral Spine (Wide prep: From the lower border of the scapula down to the end of the tailbone and from flank to flank.)  Materials:  Tray: Block Needle(s):  Type: Spinal  Gauge (G): 22  Length: 3.5-in Qty: 2      H&P (Pre-op Assessment):  [name] is a 67 y.o. (year old), female patient, seen today for interventional treatment.  She  has a past surgical history that includes Cholecystectomy; Rectocele repair; Abdominal hysterectomy; Appendectomy; Colonoscopy; Esophagogastroduodenoscopy; Sphincterotomy (N/A, 01/24/2017); Hemorrhoid surgery (N/A, 03/07/2017); Hernia repair; Esophagogastroduodenoscopy (egd) with propofol (N/A, 09/17/2017); Oophorectomy; and Breast biopsy (Bilateral, 1977). [name] has a current medication list which includes the following prescription(s): acetaminophen, amitriptyline, amlodipine, aspirin, esomeprazole, gabapentin, melatonin, methocarbamol, montelukast, oxycodone-acetaminophen, [START ON 10/15/2022] oxycodone-acetaminophen, [START ON 11/14/2022] oxycodone-acetaminophen, promethazine, quetiapine, ropinirole, simvastatin, and valsartan. Her primarily concern today is the Back Pain (lower)  Initial Vital Signs:  Pulse/HCG Rate: 69ECG Heart Rate: 73 Temp: (!) 97.3 F (36.3 C) Resp: 12 BP: (!) 167/76 SpO2: 100 %  BMI: Estimated body mass index is 27.21 kg/m as calculated from the following:   Height as of this encounter: 5\' 1"  (1.549 m).   Weight as of this encounter: 144 lb (65.3 kg).  Risk Assessment: Allergies: Reviewed. She is allergic to trazodone, codeine, cortisone, and zolpidem.  Allergy Precautions: None required Coagulopathies: Reviewed. None identified.  Blood-thinner therapy: None at this time Active Infection(s): Reviewed. None identified. [name] is afebrile  Site Confirmation: [name] was asked to confirm the procedure and laterality before marking the site Procedure checklist: Completed Consent: Before the procedure and under the influence of no sedative(s), amnesic(s), or anxiolytics, the patient was informed of the treatment options, risks and possible complications. To fulfill our ethical and legal obligations, as recommended by the American Medical Association's Code of Ethics, I have informed the patient of my clinical impression; the nature and purpose of the  treatment or procedure; the risks, benefits, and possible complications of the intervention; the alternatives, including doing nothing; the risk(s) and benefit(s) of the alternative treatment(s) or procedure(s); and the risk(s) and benefit(s) of doing nothing. The patient was provided information  about the general risks and possible complications associated with the procedure. These may include, but are not limited to: failure to achieve desired goals, infection, bleeding, organ or nerve damage, allergic reactions, paralysis, and death. In addition, the patient was informed of those risks and complications associated to Spine-related procedures, such as failure to decrease pain; infection (i.e.: Meningitis, epidural or intraspinal abscess); bleeding (i.e.: epidural hematoma, subarachnoid hemorrhage, or any other type of intraspinal or peri-dural bleeding); organ or nerve damage (i.e.: Any type of peripheral nerve, nerve root, or spinal cord injury) with subsequent damage to sensory, motor, and/or autonomic systems, resulting in permanent pain, numbness, and/or weakness of one or several areas of the body; allergic reactions; (i.e.: anaphylactic reaction); and/or death. Furthermore, the patient was informed of those risks and complications associated with the medications. These include, but are not limited to: allergic reactions (i.e.: anaphylactic or anaphylactoid reaction(s)); adrenal axis suppression; blood sugar elevation that in diabetics may result in ketoacidosis or comma; water retention that in patients with history of congestive heart failure may result in shortness of breath, pulmonary edema, and decompensation with resultant heart failure; weight gain; swelling or edema; medication-induced neural toxicity; particulate matter embolism and blood vessel occlusion with resultant organ, and/or nervous system infarction; and/or aseptic necrosis of one or more joints. Finally, the patient was informed that  Medicine is not an exact science; therefore, there is also the possibility of unforeseen or unpredictable risks and/or possible complications that may result in a catastrophic outcome. The patient indicated having understood very clearly. We have given the patient no guarantees and we have made no promises. Enough time was given to the patient to ask questions, all of which were answered to the patient's satisfaction. Ms. Deininger has indicated that she wanted to continue with the procedure. Attestation: I, the ordering provider, attest that I have discussed with the patient the benefits, risks, side-effects, alternatives, likelihood of achieving goals, and potential problems during recovery for the procedure that I have provided informed consent. Date  Time: 10/01/2022 11:13 AM   Pre-Procedure Preparation:  Monitoring: As per clinic protocol. Respiration, ETCO2, SpO2, BP, heart rate and rhythm monitor placed and checked for adequate function Safety Precautions: Patient was assessed for positional comfort and pressure points before starting the procedure. Time-out: I initiated and conducted the "Time-out" before starting the procedure, as per protocol. The patient was asked to participate by confirming the accuracy of the "Time Out" information. Verification of the correct person, site, and procedure were performed and confirmed by me, the nursing staff, and the patient. "Time-out" conducted as per Joint Commission's Universal Protocol (UP.01.01.01). Time: 1205 Start Time: 1205 hrs.  Description of Procedure:          Laterality: (see above) Targeted Levels: (see above)  Safety Precautions: Aspiration looking for blood return was conducted prior to all injections. At no point did we inject any substances, as a needle was being advanced. Before injecting, the patient was told to immediately notify me if she was experiencing any new onset of "ringing in the ears, or metallic taste in the mouth". No  attempts were made at seeking any paresthesias. Safe injection practices and needle disposal techniques used. Medications properly checked for expiration dates. SDV (single dose vial) medications used. After the completion of the procedure, all disposable equipment used was discarded in the proper designated medical waste containers. Local Anesthesia: Protocol guidelines were followed. The patient was positioned over the fluoroscopy table. The area was prepped in the usual manner.  The time-out was completed. The target area was identified using fluoroscopy. A 12-in long, straight, sterile hemostat was used with fluoroscopic guidance to locate the targets for each level blocked. Once located, the skin was marked with an approved surgical skin marker. Once all sites were marked, the skin (epidermis, dermis, and hypodermis), as well as deeper tissues (fat, connective tissue and muscle) were infiltrated with a small amount of a short-acting local anesthetic, loaded on a 10cc syringe with a 25G, 1.5-in  Needle. An appropriate amount of time was allowed for local anesthetics to take effect before proceeding to the next step. Local Anesthetic: Lidocaine 2.0% The unused portion of the local anesthetic was discarded in the proper designated containers. Technical description of process:  L3 Medial Branch Nerve Block (MBB): The target area for the L3 medial branch is at the junction of the postero-lateral aspect of the superior articular process and the superior, posterior, and medial edge of the transverse process of L4. Under fluoroscopic guidance, a Quincke needle was inserted until contact was made with os over the superior postero-lateral aspect of the pedicular shadow (target area). After negative aspiration for blood, 2mL of the nerve block solution was injected without difficulty or complication. The needle was removed intact. L4 Medial Branch Nerve Block (MBB): The target area for the L4 medial branch is at the  junction of the postero-lateral aspect of the superior articular process and the superior, posterior, and medial edge of the transverse process of L5. Under fluoroscopic guidance, a Quincke needle was inserted until contact was made with os over the superior postero-lateral aspect of the pedicular shadow (target area). After negative aspiration for blood, 2mL of the nerve block solution was injected without difficulty or complication. The needle was removed intact. L5 Medial Branch Nerve Block (MBB): The target area for the L5 medial branch is at the junction of the postero-lateral aspect of the superior articular process and the superior, posterior, and medial edge of the sacral ala. Under fluoroscopic guidance, a Quincke needle was inserted until contact was made with os over the superior postero-lateral aspect of the pedicular shadow (target area). After negative aspiration for blood, 2mL of the nerve block solution was injected without difficulty or complication. The needle was removed intact.   Once the entire procedure was completed, the treated area was cleaned, making sure to leave some of the prepping solution back to take advantage of its long term bactericidal properties.         Illustration of the posterior view of the lumbar spine and the posterior neural structures. Laminae of L2 through S1 are labeled. DPRL5, dorsal primary ramus of L5; DPRS1, dorsal primary ramus of S1; DPR3, dorsal primary ramus of L3; FJ, facet (zygapophyseal) joint L3-L4; I, inferior articular process of L4; LB1, lateral branch of dorsal primary ramus of L1; IAB, inferior articular branches from L3 medial branch (supplies L4-L5 facet joint); IBP, intermediate branch plexus; MB3, medial branch of dorsal primary ramus of L3; NR3, third lumbar nerve root; S, superior articular process of L5; SAB, superior articular branches from L4 (supplies L4-5 facet joint also); TP3, transverse process of L3.   Facet Joint  Innervation (* possible contribution)  L1-2 T12, L1 (L2*)  Medial Branch  L2-3 L1, L2 (L3*)         "          "  L3-4 L2, L3 (L4*)         "          "  L4-5 L3, L4 (L5*)         "          "  L5-S1 L4, L5, S1          "          "    Vitals:   10/01/22 1205 10/01/22 1209 10/01/22 1213 10/01/22 1217  BP: (!) 171/85 (!) 187/85 (!) 160/110 (!) 145/78  Pulse:      Resp: 12 14 11 15   Temp:      TempSrc:      SpO2: 100% 100% 100% 100%  Weight:      Height:         End Time: 1215 hrs.  Imaging Guidance (Spinal):          Type of Imaging Technique: Fluoroscopy Guidance (Spinal) Indication(s): Assistance in needle guidance and placement for procedures requiring needle placement in or near specific anatomical locations not easily accessible without such assistance. Exposure Time: Please see nurses notes. Contrast: None used. Fluoroscopic Guidance: I was personally present during the use of fluoroscopy. "Tunnel Vision Technique" used to obtain the best possible view of the target area. Parallax error corrected before commencing the procedure. "Direction-depth-direction" technique used to introduce the needle under continuous pulsed fluoroscopy. Once target was reached, antero-posterior, oblique, and lateral fluoroscopic projection used confirm needle placement in all planes. Images permanently stored in EMR. Interpretation: No contrast injected. I personally interpreted the imaging intraoperatively. Adequate needle placement confirmed in multiple planes. Permanent images saved into the patient's record.  Post-operative Assessment:  Post-procedure Vital Signs:  Pulse/HCG Rate: 6965 Temp: (!) 97.3 F (36.3 C) Resp: 15 BP: (!) 145/78 SpO2: 100 %  EBL: None  Complications: No immediate post-treatment complications observed by team, or reported by patient.  Note: The patient tolerated the entire procedure well. A repeat set of vitals were taken after the procedure and the patient was  kept under observation following institutional policy, for this type of procedure. Post-procedural neurological assessment was performed, showing return to baseline, prior to discharge. The patient was provided with post-procedure discharge instructions, including a section on how to identify potential problems. Should any problems arise concerning this procedure, the patient was given instructions to immediately contact us, at any time, without hesitation. In any case, we plan to contact the patient by telephone for a follow-up status report regarding this interventional procedure.  Comments:  No additional relevant information.  Plan of Care (POC)  Orders:  Orders Placed This Encounter  Procedures   DG PAIN CLIN[facility] C-ARM 1-60 MIN NO REPORT    Intraoperative interpretation by procedural physician at [facility].    Standing Status:   Standing    Number of Occurrences:   1    Order Specific Question:   Reason for exam:    Answer:   Assistance in needle guidance and placement for procedures requiring needle placement in or near specific anatomical locations not easily accessible without such assistance.   Chronic Opioid Analgesic:  Oxycodone 5 mg every 6 hours as needed, quantity 120/month; MME equals 30.    Medications ordered for procedure: Meds ordered this encounter  Medications   lidocaine (XYLOCAINE) 2 % (with pres) injection 400 mg   dexamethasone (DECADRON) injection 10 mg   dexamethasone (DECADRON) injection 10 mg   ropivacaine (PF) 2 mg/mL (0.2%) (NAROPIN) injection 9 mL   ropivacaine (PF) 2 mg/mL (0.2%) (NAROPIN) injection 9 mL   Medications administered: We administered lidocaine, dexamethasone, dexamethasone, ropivacaine (PF)  2 mg/mL (0.2%), and ropivacaine (PF) 2 mg/mL (0.2%).  See the medical record for exact dosing, route, and time of administration.  Follow-up plan:   Return in 4 weeks (on 10/29/2022), or PPE, VV, for  .       Status post bilateral  intra-articular knee steroid, right elbow injection on 04/05/2019.  Status post L4-L5 ESI, right-sided along with right shoulder steroid injection on 06/16/2019, right shoulder injection on 07/28/2019, posterior approach; B/L L4 and L5 TF ESI.  B/L L3,4,5 MBNB #1 10/01/22            Recent Visits Date Type Provider Dept  09/12/22 Office Visit Edward Jolly, MD [facility]  07/10/22 Procedure visit Edward Jolly, MD [facility]  Showing recent visits within past 90 days and meeting all other requirements Today's Visits Date Type Provider Dept  10/01/22 Procedure visit Edward Jolly, MD [facility]  Showing today's visits and meeting all other requirements Future Appointments Date Type Provider Dept  10/29/22 Appointment Edward Jolly, MD [facility]  12/10/22 Appointment Edward Jolly, MD [facility]  Showing future appointments within next 90 days and meeting all other requirements  Disposition: Discharge home  Discharge (Date  Time): 10/01/2022; 1236 hrs.   Primary Care Physician: Gracelyn Nurse, MD Location: [facility] Note by: Edward Jolly, MD (TTS technology used. I apologize for any typographical errors that were not detected and corrected.) Date: 10/01/2022; Time: 1:20 PM  Disclaimer:  Medicine is not an Visual merchandiser. The only guarantee in medicine is that nothing is guaranteed. It is important to note that the decision to proceed with this intervention was based on the information collected from the patient. The Data and conclusions were drawn from the patient's questionnaire, the interview, and the physical examination. Because the information was provided in large part by the patient, it cannot be guaranteed that it has not been purposely or unconsciously manipulated. Every effort has been made to obtain as much relevant data as possible for this evaluation. It is important to note that the conclusions  that lead to this procedure are derived in large part from the available data. Always take into account that the treatment will also be dependent on availability of resources and existing treatment guidelines, considered by other Pain Management Practitioners as being common knowledge and practice, at the time of the intervention. For Medico-Legal purposes, it is also important to point out that variation in procedural techniques and pharmacological choices are the acceptable norm. The indications, contraindications, technique, and results of the above procedure should only be interpreted and judged by a Board-Certified Interventional Pain Specialist with extensive familiarity and expertise in the same exact procedure and technique.

## 2022-10-02 ENCOUNTER — Telehealth: Payer: Self-pay | Admitting: Student in an Organized Health Care Education/Training Program

## 2022-10-02 ENCOUNTER — Telehealth: Payer: Self-pay

## 2022-10-02 NOTE — Telephone Encounter (Signed)
Post procedure follow up.  Patient states she hurt all night.  Instructed to give the steroid a few days to start working but to call us for any further questions or concerns or increase in symptoms.

## 2022-10-02 NOTE — Telephone Encounter (Signed)
PT states that she has been in pain since the procedure on yesterday. PT states that she has been up all night couldn't sleep. PT states that she use the ice pack and took meds. Please give patient a call. TY

## 2022-10-02 NOTE — Telephone Encounter (Signed)
Spoke with patient and informed her that she would need to give the steroid some time to start .working.  States understanding.  Instructed to call back for any further questions or concerns.

## 2022-10-16 ENCOUNTER — Encounter: Admission: RE | Payer: Self-pay | Source: Home / Self Care

## 2022-10-16 ENCOUNTER — Ambulatory Visit: Admission: RE | Admit: 2022-10-16 | Payer: Medicare PPO | Source: Home / Self Care | Admitting: Internal Medicine

## 2022-10-16 SURGERY — COLONOSCOPY WITH PROPOFOL
Anesthesia: General

## 2022-10-28 ENCOUNTER — Encounter: Payer: Self-pay | Admitting: Student in an Organized Health Care Education/Training Program

## 2022-10-28 ENCOUNTER — Telehealth: Payer: Self-pay

## 2022-10-28 NOTE — Telephone Encounter (Signed)
Attempted to call fopr pre virtual appointment questions.  Voicemail has not been set up yet and I was unable to leave a message.

## 2022-10-29 ENCOUNTER — Ambulatory Visit
Payer: Medicare PPO | Attending: Student in an Organized Health Care Education/Training Program | Admitting: Student in an Organized Health Care Education/Training Program

## 2022-10-29 DIAGNOSIS — M4726 Other spondylosis with radiculopathy, lumbar region: Secondary | ICD-10-CM | POA: Diagnosis not present

## 2022-10-29 DIAGNOSIS — M47816 Spondylosis without myelopathy or radiculopathy, lumbar region: Secondary | ICD-10-CM

## 2022-10-29 DIAGNOSIS — H5316 Psychophysical visual disturbances: Secondary | ICD-10-CM

## 2022-10-29 DIAGNOSIS — M533 Sacrococcygeal disorders, not elsewhere classified: Secondary | ICD-10-CM | POA: Diagnosis not present

## 2022-10-29 DIAGNOSIS — G894 Chronic pain syndrome: Secondary | ICD-10-CM

## 2022-10-29 DIAGNOSIS — G8929 Other chronic pain: Secondary | ICD-10-CM

## 2022-10-29 NOTE — Progress Notes (Signed)
Patient: Sandra [name]  Service Category: E/M  Provider: Edward Jolly, MD  DOB: 1955/05/29  DOS: 10/29/2022  Location: Office  MRN: [id number]  Setting: Ambulatory outpatient  Referring Provider: Gracelyn Nurse, MD  Type: Established Patient  Specialty: Interventional Pain Management  PCP: Gracelyn Nurse, MD  Location: Remote location  Delivery: TeleHealth     Virtual Encounter - Pain Management PROVIDER NOTE: Information contained herein reflects review and annotations entered in association with encounter. Interpretation of such information and data should be left to medically-trained personnel. Information provided to patient can be located elsewhere in the medical record under "Patient Instructions". Document created using STT-dictation technology, any transcriptional errors that may result from process are unintentional.    Contact & Pharmacy Preferred: [phone] Home: [phone] (home) Mobile: There is no such number on file (mobile). E-mail: No e-mail address on record  [facility] - [place], [place] - [address] [place] [address] Phone: [phone] Fax: [phone]  [id number] [place], [place] - [place] [address] AT [facility] [address] [place] [address] Phone: [phone] Fax: [phone]   Pre-screening  Sandra [name] offered "in-person" vs "virtual" encounter. She indicated preferring virtual for this encounter.   Reason COVID-19*  Social distancing based on CDC and AMA recommendations.   I contacted Sandra [name] on 10/29/2022 via telephone.      I clearly identified myself as Edward Jolly, MD. I verified that I was speaking with the correct person using two identifiers (Name: Makalynn Kaake, and date of birth: 12-Nov-1955).  Consent I sought verbal advanced consent from Lois Huxley for virtual visit interactions. I informed Sandra [name] of possible security and privacy concerns, risks, and  limitations associated with providing "not-in-person" medical evaluation and management services. I also informed Sandra [name] of the availability of "in-person" appointments. Finally, I informed her that there would be a charge for the virtual visit and that she could be  personally, fully or partially, financially responsible for it. Sandra [name] expressed understanding and agreed to proceed.   Historic Elements   Sandra [name] is a 67 y.o. year old, female patient evaluated today after our last contact on 10/02/2022. Sandra [name]  has a past medical history of Anemia, Anxiety (08/01/2014), Arthritis, degenerative (07/30/2013), Clinical depression (06/30/2014), Depression, Elevated liver enzymes, GERD (gastroesophageal reflux disease), H/O breast biopsy (01/05/2015), H/O: attempted suicide (2013), Hepatitis B, Hiatal hernia, History of hysterectomy (01/05/2015), History of peptic ulcer disease (01/05/2015), Hypercholesteremia, Hypertension, Legally blind, Panic attacks, PUD (peptic ulcer disease), Rectocele (01/05/2015), Retinitis pigmentosa, and S/P cholecystectomy (01/05/2015). She also  has a past surgical history that includes Cholecystectomy; Rectocele repair; Abdominal hysterectomy; Appendectomy; Colonoscopy; Esophagogastroduodenoscopy; Sphincterotomy (N/A, 01/24/2017); Hemorrhoid surgery (N/A, 03/07/2017); Hernia repair; Esophagogastroduodenoscopy (egd) with propofol (N/A, 09/17/2017); Oophorectomy; and Breast biopsy (Bilateral, 1977). Sandra [name] has a current medication list which includes the following prescription(s): acetaminophen, amitriptyline, amlodipine, aspirin, gabapentin, melatonin, methocarbamol, montelukast, oxycodone-acetaminophen, [START ON 11/14/2022] oxycodone-acetaminophen, promethazine, quetiapine, ropinirole, simvastatin, valsartan, and esomeprazole. She  reports that she quit smoking about 49 years ago. Her smoking use included cigarettes. She has never used smokeless tobacco. She  reports that she does not drink alcohol and does not use drugs. Sandra [name] is allergic to trazodone, codeine, cortisone, and zolpidem.  BMI: Estimated body mass index is 27.21 kg/m as calculated from the following:   Height as of 10/01/22: 5\' 1"  (1.549 m).   Weight  as of 10/01/22: 144 lb (65.3 kg). Last encounter: 09/12/2022. Last procedure: 10/01/2022.  HPI  Today, she is being contacted for a post-procedure assessment.   Post-procedure evaluation   Procedure: Lumbar Facet, Medial Branch Block(s) #1  Laterality: Bilateral  Level: L3, L4, and L5 Medial Branch Level(s). Injecting these levels blocks the L3-4 and L4-5 lumbar facet joints. Imaging: Fluoroscopic guidance         Anesthesia: Local anesthesia (1-2% Lidocaine) DOS: 10/01/2022 Performed by: Edward Jolly, MD  Primary Purpose: Diagnostic/Therapeutic Indications: Low back pain severe enough to impact quality of life or function. 1. Lumbar facet arthropathy   2. Lumbar facet syndrome (Bilateral) (R>L)    NAS-11 Pain score:   Pre-procedure: 10-Worst pain ever/10   Post-procedure: 0-No pain/10      Effectiveness:  Initial hour after procedure: 0 %  Subsequent 4-6 hours post-procedure: 0 %  Analgesia past initial 6 hours: 0 %  Ongoing improvement:  Analgesic:  0%  Pharmacotherapy Assessment   Opioid Analgesic: Oxycodone 5 mg every 6 hours as needed, quantity 120/month; MME equals 30.    Monitoring: [place] PMP: PDMP reviewed during this encounter.       Pharmacotherapy: No side-effects or adverse reactions reported. Compliance: No problems identified. Effectiveness: Clinically acceptable. Plan: Refer to "POC". UDS:  Summary  Date Value Ref Range Status  03/07/2022 Note  Final    Comment:    ==================================================================== [employer] Select 13 (MW) ==================================================================== Test                             Result       Flag        Units  Drug Present and Declared for Prescription Verification   Hydrocodone                    3984         EXPECTED   ng/mg creat   Hydromorphone                  412          EXPECTED   ng/mg creat   Dihydrocodeine                 435          EXPECTED   ng/mg creat   Norhydrocodone                 >3030        EXPECTED   ng/mg creat    Sources of hydrocodone include scheduled prescription medications.    Hydromorphone, dihydrocodeine and norhydrocodone are expected    metabolites of hydrocodone. Hydromorphone and dihydrocodeine are    also available as scheduled prescription medications.  ==================================================================== Test                      Result    Flag   Units      Ref Range   Creatinine              165              mg/dL      >=16 ==================================================================== Declared Medications:  The flagging and interpretation on this report are based on the  following declared medications.  Unexpected results may arise from  inaccuracies in the declared medications.   **Note: The testing scope of this panel includes these medications:   Hydrocodone (Norco)   **  Note: The testing scope of this panel does not include the  following reported medications:   Acetaminophen (Tylenol)  Acetaminophen (Norco)  Albuterol (Ventolin HFA)  Amitriptyline (Elavil)  Amlodipine (Norvasc)  Aspirin  Darbepoetin Alfa  Esomeprazole (Nexium)  Fluticasone (Flonase)  Gabapentin (Neurontin)  Melatonin  Methocarbamol (Robaxin)  Montelukast (Singulair)  Promethazine (Phenergan)  Quetiapine (Seroquel)  Simvastatin (Zocor)  Valsartan (Diovan) ==================================================================== For clinical consultation, please call [phone]. ====================================================================    No results found for: "CBDTHCR", "D8THCCBX", "D9THCCBX"   Laboratory Chemistry  Profile   Renal Lab Results  Component Value Date   BUN 15 02/22/2022   CREATININE 0.65 02/22/2022   GFRAA >60 01/16/2017   GFRNONAA >60 02/22/2022    Hepatic Lab Results  Component Value Date   AST 29 02/22/2022   ALT 28 02/22/2022   ALBUMIN 4.9 02/22/2022   ALKPHOS 60 02/22/2022   HCVAB NON REACTIVE 06/26/2021   LIPASE 38 02/22/2022   AMMONIA 31 09/28/2020    Electrolytes Lab Results  Component Value Date   NA 140 02/22/2022   K 3.5 02/22/2022   CL 104 02/22/2022   CALCIUM 9.4 02/22/2022   MG 2.0 09/27/2020    Bone Lab Results  Component Value Date   25OHVITD1 19 (L) 02/07/2015   25OHVITD2 3.9 02/07/2015   25OHVITD3 15 02/07/2015    Inflammation (CRP: Acute Phase) (ESR: Chronic Phase) Lab Results  Component Value Date   CRP 1.3 (H) 09/27/2020   ESRSEDRATE 26 02/07/2015   LATICACIDVEN 1.1 09/27/2020         Note: Above Lab results reviewed.   Assessment  The primary encounter diagnosis was Lumbar facet arthropathy. Diagnoses of Coccydynia, Chronic radicular lumbar pain, [name] Syndrome (CBS), Sacroiliac joint pain, and Chronic pain syndrome were also pertinent to this visit.  Plan of Care  Limited response to diagnostic lumbar facet medial branch nerve blocks.  Continues to endorse fairly persistent and severe axial low back pain.  Future considerations could include Sprint peripheral nerve stimulation of the lumbar medial branch nerve.  I will discuss this with her further when she follows up next month for medication management.  In the interim, I did discuss increasing amitriptyline to 50 mg nightly.  She can also consider supplementing with additional Tylenol 500 mg every 6 hours as needed.  Follow-up plan:   Return for Keep sch. appt.      Status post bilateral intra-articular knee steroid, right elbow injection on 04/05/2019.  Status post L4-L5 ESI, right-sided along with right shoulder steroid injection on 06/16/2019, right shoulder injection on  07/28/2019, posterior approach; B/L L4 and L5 TF ESI.  B/L L3,4,5 MBNB #1 10/01/22           Recent Visits Date Type Provider Dept  10/01/22 Procedure visit Edward Jolly, MD [facility]  09/12/22 Office Visit Edward Jolly, MD [facility]  Showing recent visits within past 90 days and meeting all other requirements Today's Visits Date Type Provider Dept  10/29/22 Office Visit Edward Jolly, MD [facility]  Showing today's visits and meeting all other requirements Future Appointments Date Type Provider Dept  12/10/22 Appointment Edward Jolly, MD [facility]  Showing future appointments within next 90 days and meeting all other requirements  I discussed the assessment and treatment plan with the patient. The patient was provided an opportunity to ask questions and all were answered. The patient agreed with the plan and demonstrated an understanding of the instructions.  Patient advised to call  back or seek an in-person evaluation if the symptoms or condition worsens.  Duration of encounter: .  Note by: Edward Jolly, MD Date: 10/29/2022; Time: 3:59 PM

## 2022-11-14 ENCOUNTER — Ambulatory Visit
Payer: Medicare PPO | Attending: Student in an Organized Health Care Education/Training Program | Admitting: Student in an Organized Health Care Education/Training Program

## 2022-11-14 ENCOUNTER — Encounter: Payer: Self-pay | Admitting: Student in an Organized Health Care Education/Training Program

## 2022-11-14 VITALS — BP 190/87 | HR 80 | Temp 97.9°F | Resp 16 | Ht 61.0 in | Wt 145.0 lb

## 2022-11-14 DIAGNOSIS — M533 Sacrococcygeal disorders, not elsewhere classified: Secondary | ICD-10-CM | POA: Insufficient documentation

## 2022-11-14 DIAGNOSIS — M47816 Spondylosis without myelopathy or radiculopathy, lumbar region: Secondary | ICD-10-CM | POA: Diagnosis present

## 2022-11-14 DIAGNOSIS — H5316 Psychophysical visual disturbances: Secondary | ICD-10-CM | POA: Diagnosis present

## 2022-11-14 DIAGNOSIS — G894 Chronic pain syndrome: Secondary | ICD-10-CM | POA: Diagnosis present

## 2022-11-14 DIAGNOSIS — M47818 Spondylosis without myelopathy or radiculopathy, sacral and sacrococcygeal region: Secondary | ICD-10-CM | POA: Insufficient documentation

## 2022-11-14 MED ORDER — OXYCODONE-ACETAMINOPHEN 7.5-325 MG PO TABS
1.0000 | ORAL_TABLET | Freq: Four times a day (QID) | ORAL | 0 refills | Status: AC | PRN
Start: 2022-11-14 — End: 2022-12-14

## 2022-11-14 MED ORDER — OXYCODONE-ACETAMINOPHEN 7.5-325 MG PO TABS
1.0000 | ORAL_TABLET | Freq: Four times a day (QID) | ORAL | 0 refills | Status: AC | PRN
Start: 2022-12-14 — End: 2023-01-13

## 2022-11-14 MED ORDER — OXYCODONE-ACETAMINOPHEN 7.5-325 MG PO TABS
1.0000 | ORAL_TABLET | Freq: Four times a day (QID) | ORAL | 0 refills | Status: DC | PRN
Start: 2023-01-13 — End: 2023-02-06

## 2022-11-14 NOTE — Progress Notes (Signed)
Nursing Pain Medication Assessment:  Safety precautions to be maintained throughout the outpatient stay will include: orient to surroundings, keep bed in low position, maintain call bell within reach at all times, provide assistance with transfer out of bed and ambulation.  Medication Inspection Compliance: Ms. Petrovic did not comply with our request to bring her pills to be counted. She was reminded that bringing the medication bottles, even when empty, is a requirement.  Medication: None brought in. Pill/Patch Count: None available to be counted. Bottle Appearance: No container available. Did not bring bottle(s) to appointment. Filled Date: N/A Last Medication intake:  Yesterday Safety precautions to be maintained throughout the outpatient stay will include: orient to surroundings, keep bed in low position, maintain call bell within reach at all times, provide assistance with transfer out of bed and ambulation.

## 2022-11-14 NOTE — Progress Notes (Signed)
PROVIDER NOTE: Information contained herein reflects review and annotations entered in association with encounter. Interpretation of such information and data should be left to medically-trained personnel. Information provided to patient can be located elsewhere in the medical record under "Patient Instructions". Document created using STT-dictation technology, any transcriptional errors that may result from process are unintentional.    Patient: Sandra [name]  Service Category: E/M  Provider: Edward Jolly, MD  DOB: 09/20/1955  DOS: 11/14/2022  Referring Provider: Gracelyn Nurse, MD  MRN: [id number]  Specialty: Interventional Pain Management  PCP: Gracelyn Nurse, MD  Type: Established Patient  Setting: Ambulatory outpatient    Location: Office  Delivery: Face-to-face     HPI  Ms. Dorrie Milby, a 67 y.o. year old female, is here today because of her Chronic pain syndrome [G89.4]. Ms. Reader's primary complain today is Back Pain (lower)  Pertinent problems: Ms. Wofford has Long term current use of opiate analgesic; Long term prescription opiate use; Opiate use; Hallucinations, visual; Psychiatric disorder; Chronic low back pain (Location of Primary Source of Pain) (Bilateral) (R>L); Chronic bilateral knee pain; Opiate dependence (HCC); Lumbar facet arthropathy; Lumbar spondylosis; Lumbar facet syndrome (Bilateral) (R>L); Diffuse myofascial pain syndrome; [name] Syndrome (CBS); Arthropathy of right elbow; and Localized osteoarthritis of right shoulder on their pertinent problem list. Pain Assessment: Severity of Chronic pain is reported as a 6 /10. Location: Back Lower/both legs to the feet. Onset: More than a month ago. Quality: Dull, Aching. Timing: Constant. Modifying factor(s): medications. Vitals:  height is 5\' 1"  (1.549 m) and weight is 145 lb (65.8 kg). Her temporal temperature is 97.9 F (36.6 C). Her blood pressure is 190/87 (abnormal) and her pulse is 80. Her respiration is 16  and oxygen saturation is 100%.  BMI: Estimated body mass index is 27.4 kg/m as calculated from the following:   Height as of this encounter: 5\' 1"  (1.549 m).   Weight as of this encounter: 145 lb (65.8 kg). Last encounter: 06/18/2022. Last procedure: 07/10/2022.  Reason for encounter: medication management and worsening low back pain  Presents today with increased low back pain that is worse with lumbar extension.  She is requesting an increase in her Oxycodone given difficult to management pain She states that she is having difficulty performing ADLs given her low back pain.  She's received short term benefit with lumbar transforaminal epidural steroid injection.  Pharmacotherapy Assessment  Analgesic:  Percocet  7.5 mg every 6 hours as needed, quantity 120/month; MME equals 45.    Monitoring: [place] PMP: PDMP reviewed during this encounter.       Pharmacotherapy: No side-effects or adverse reactions reported. Compliance: No problems identified. Effectiveness: Clinically acceptable.  Newman Pies, RN  11/14/2022  2:29 PM  Sign when Signing Visit Called current pharmacy to discontinue 2 prescriptions for Percocet 5mg . New dose ordered.  Spoke with tech and then was transferred to pharm 24 min 38 sec. Before I wa able to cancel prescriptions  Concepcion Elk, RN  11/14/2022  1:42 PM  Sign when Signing Visit Nursing Pain Medication Assessment:  Safety precautions to be maintained throughout the outpatient stay will include: orient to surroundings, keep bed in low position, maintain call bell within reach at all times, provide assistance with transfer out of bed and ambulation.  Medication Inspection Compliance: Ms. Leinen did not comply with our request to bring her pills to be counted. She was reminded that bringing the medication bottles, even when empty, is a requirement.  Medication: None  brought in. Pill/Patch Count: None available to be counted. Bottle Appearance: No container  available. Did not bring bottle(s) to appointment. Filled Date: N/A Last Medication intake:  Yesterday Safety precautions to be maintained throughout the outpatient stay will include: orient to surroundings, keep bed in low position, maintain call bell within reach at all times, provide assistance with transfer out of bed and ambulation.   No results found for: "CBDTHCR" No results found for: "D8THCCBX" No results found for: "D9THCCBX"  UDS:  Summary  Date Value Ref Range Status  03/07/2022 Note  Final    Comment:    ==================================================================== [facility] (MW) ==================================================================== Test                             Result       Flag       Units  Drug Present and Declared for Prescription Verification   Hydrocodone                    3984         EXPECTED   ng/mg creat   Hydromorphone                  412          EXPECTED   ng/mg creat   Dihydrocodeine                 435          EXPECTED   ng/mg creat   Norhydrocodone                 >3030        EXPECTED   ng/mg creat    Sources of hydrocodone include scheduled prescription medications.    Hydromorphone, dihydrocodeine and norhydrocodone are expected    metabolites of hydrocodone. Hydromorphone and dihydrocodeine are    also available as scheduled prescription medications.  ==================================================================== Test                      Result    Flag   Units      Ref Range   Creatinine              165              mg/dL      >=19 ==================================================================== Declared Medications:  The flagging and interpretation on this report are based on the  following declared medications.  Unexpected results may arise from  inaccuracies in the declared medications.   **Note: The testing scope of this panel includes these medications:   Hydrocodone (Norco)   **Note: The  testing scope of this panel does not include the  following reported medications:   Acetaminophen (Tylenol)  Acetaminophen (Norco)  Albuterol (Ventolin HFA)  Amitriptyline (Elavil)  Amlodipine (Norvasc)  Aspirin  Darbepoetin Alfa  Esomeprazole (Nexium)  Fluticasone (Flonase)  Gabapentin (Neurontin)  Melatonin  Methocarbamol (Robaxin)  Montelukast (Singulair)  Promethazine (Phenergan)  Quetiapine (Seroquel)  Simvastatin (Zocor)  Valsartan (Diovan) ==================================================================== For clinical consultation, please call [phone]. ====================================================================       ROS  Constitutional: Denies any fever or chills Gastrointestinal: No reported hemesis, hematochezia, vomiting, or acute GI distress Musculoskeletal:  Low back pain Neurological: No reported episodes of acute onset apraxia, aphasia, dysarthria, agnosia, amnesia, paralysis, loss of coordination, or loss of consciousness  Medication Review  Melatonin, QUEtiapine, acetaminophen, amLODipine, amitriptyline, aspirin, esomeprazole, gabapentin, methocarbamol,  montelukast, oxyCODONE-acetaminophen, promethazine, rOPINIRole, simvastatin, and valsartan  History Review  Allergy: Ms. Portee is allergic to trazodone, codeine, cortisone, and zolpidem. Drug: Ms. Chell  reports no history of drug use. Alcohol:  reports no history of alcohol use. Tobacco:  reports that she quit smoking about 49 years ago. Her smoking use included cigarettes. She has never used smokeless tobacco. Social: Ms. Gherardi  reports that she quit smoking about 49 years ago. Her smoking use included cigarettes. She has never used smokeless tobacco. She reports that she does not drink alcohol and does not use drugs. Medical:  has a past medical history of Anemia, Anxiety (08/01/2014), Arthritis, degenerative (07/30/2013), Clinical depression (06/30/2014), Depression, Elevated liver  enzymes, GERD (gastroesophageal reflux disease), H/O breast biopsy (01/05/2015), H/O: attempted suicide (2013), Hepatitis B, Hiatal hernia, History of hysterectomy (01/05/2015), History of peptic ulcer disease (01/05/2015), Hypercholesteremia, Hypertension, Legally blind, Panic attacks, PUD (peptic ulcer disease), Rectocele (01/05/2015), Retinitis pigmentosa, and S/P cholecystectomy (01/05/2015). Surgical: Ms. Kerkstra  has a past surgical history that includes Cholecystectomy; Rectocele repair; Abdominal hysterectomy; Appendectomy; Colonoscopy; Esophagogastroduodenoscopy; Sphincterotomy (N/A, 01/24/2017); Hemorrhoid surgery (N/A, 03/07/2017); Hernia repair; Esophagogastroduodenoscopy (egd) with propofol (N/A, 09/17/2017); Oophorectomy; and Breast biopsy (Bilateral, 1977). Family: family history includes Bone cancer in her maternal uncle; Breast cancer in her sister; Breast cancer (age of onset: 77) in her mother; Cancer in her father; Dementia in her mother; Diabetes in her mother; Hypertension in her mother; Kidney cancer in her sister; Leukemia in her paternal grandfather and paternal grandmother; Pancreatic cancer in her maternal aunt; Prostate cancer (age of onset: 56) in her father.  Laboratory Chemistry Profile   Renal Lab Results  Component Value Date   BUN 15 02/22/2022   CREATININE 0.65 02/22/2022   GFRAA >60 01/16/2017   GFRNONAA >60 02/22/2022    Hepatic Lab Results  Component Value Date   AST 29 02/22/2022   ALT 28 02/22/2022   ALBUMIN 4.9 02/22/2022   ALKPHOS 60 02/22/2022   HCVAB NON REACTIVE 06/26/2021   LIPASE 38 02/22/2022   AMMONIA 31 09/28/2020    Electrolytes Lab Results  Component Value Date   NA 140 02/22/2022   K 3.5 02/22/2022   CL 104 02/22/2022   CALCIUM 9.4 02/22/2022   MG 2.0 09/27/2020    Bone Lab Results  Component Value Date   25OHVITD1 19 (L) 02/07/2015   25OHVITD2 3.9 02/07/2015   25OHVITD3 15 02/07/2015    Inflammation (CRP: Acute Phase) (ESR:  Chronic Phase) Lab Results  Component Value Date   CRP 1.3 (H) 09/27/2020   ESRSEDRATE 26 02/07/2015   LATICACIDVEN 1.1 09/27/2020         Note: Above Lab results reviewed.  Recent Imaging Review  DG PAIN CLINIC C-ARM 1-60 MIN NO REPORT Fluoro was used, but no Radiologist interpretation will be provided.  Please refer to "NOTES" tab for provider progress note.   Narrative & Impression  CLINICAL DATA:  Chronic low back pain   EXAM: LUMBAR SPINE - COMPLETE WITH BENDING VIEWS   COMPARISON:  06/05/2019   FINDINGS: There is no evidence of lumbar spine fracture. Alignment is normal. Intervertebral disc spaces are maintained. No significant change in alignment between flexion extension positioning limits assessment for ligamentous instability.  Facet hypertrophy noted at L3-L4, L4-L5 degenerative disc disease present.   IMPRESSION: 1. No acute fracture or listhesis.   Note: Reviewed        Physical Exam  General appearance: Well nourished, well developed, and well hydrated. In no apparent acute  distress Mental status: Alert, oriented x 3 (person, place, & time)       Respiratory: No evidence of acute respiratory distress Eyes: PERLA Vitals: BP (!) 190/87   Pulse 80   Temp 97.9 F (36.6 C) (Temporal)   Resp 16   Ht 5\' 1"  (1.549 m)   Wt 145 lb (65.8 kg)   SpO2 100%   BMI 27.40 kg/m  BMI: Estimated body mass index is 27.4 kg/m as calculated from the following:   Height as of this encounter: 5\' 1"  (1.549 m).   Weight as of this encounter: 145 lb (65.8 kg). Ideal: Ideal body weight: 47.8 kg (105 lb 6.1 oz) Adjusted ideal body weight: 55 kg (121 lb 3.7 oz)   Thoracic Spine Area Exam  Skin & Axial Inspection: No masses, redness, or swelling Alignment: Symmetrical Functional ROM: Unrestricted ROM Stability: No instability detected Muscle Tone/Strength: Functionally intact. No obvious neuro-muscular anomalies detected. Sensory (Neurological): Unimpaired Muscle  strength & Tone: No palpable anomalies  Lumbar Spine Area Exam  Skin & Axial Inspection: No masses, redness, or swelling Alignment: Symmetrical Functional ROM: Pain restricted ROM affecting both sides Stability: No instability detected Muscle Tone/Strength: Functionally intact. No obvious neuro-muscular anomalies detected. Sensory (Neurological): Musculoskeletal pain pattern Palpation: Complains of area being tender to palpation       Provocative Tests: Hyperextension/rotation test: (+) bilaterally for facet joint pain. Lumbar quadrant test (Kemp's test): (+) bilaterally for facet joint pain.  Lower Extremity Exam    Side: Right lower extremity  Side: Left lower extremity  Stability: No instability observed          Stability: No instability observed          Skin & Extremity Inspection: Skin color, temperature, and hair growth are WNL. No peripheral edema or cyanosis. No masses, redness, swelling, asymmetry, or associated skin lesions. No contractures.  Skin & Extremity Inspection: Skin color, temperature, and hair growth are WNL. No peripheral edema or cyanosis. No masses, redness, swelling, asymmetry, or associated skin lesions. No contractures.  Functional ROM: Pain restricted ROM for knee joint          Functional ROM: Pain restricted ROM for knee joint          Muscle Tone/Strength: Functionally intact. No obvious neuro-muscular anomalies detected.  Muscle Tone/Strength: Functionally intact. No obvious neuro-muscular anomalies detected.  Sensory (Neurological): Unimpaired        Sensory (Neurological): Unimpaired        DTR: Patellar: deferred today Achilles: deferred today Plantar: deferred today  DTR: Patellar: deferred today Achilles: deferred today Plantar: deferred today  Palpation: No palpable anomalies  Palpation: No palpable anomalies    Assessment   Diagnosis Status  1. Chronic pain syndrome   2. Lumbar facet arthropathy   3. [name] Syndrome (CBS)   4.  Lumbar facet syndrome (Bilateral) (R>L)   5. SI joint arthritis   6. Sacroiliac joint pain    Having a Flare-up Having a Flare-up Controlled    Plan of Care   Reviewed risks of chronic opioid therapy with patient.  Informed the patient that we will avoid any further dose escalation after this.  She was requesting an increase to 10 mg to help manage her pain.  We will increase from 5 mg to 7.5 mg every 6 hours as needed.  She is to continue home multimodal analgesics including amitriptyline 25 mg nightly, gabapentin 600 mg every 8 hours, and Robaxin 500 mg every 8 hours as needed.  She also is on Requip for restless leg syndrome.   Pharmacotherapy (Medications Ordered): Meds ordered this encounter  Medications   oxyCODONE-acetaminophen (PERCOCET) 7.5-325 MG tablet    Sig: Take 1 tablet by mouth every 6 (six) hours as needed for moderate pain or severe pain. Must last 30 days.    Dispense:  120 tablet    Refill:  0    Chronic Pain: STOP Act (Not applicable) Fill 1 day early if closed on refill date. Avoid benzodiazepines within 8 hours of opioids   oxyCODONE-acetaminophen (PERCOCET) 7.5-325 MG tablet    Sig: Take 1 tablet by mouth every 6 (six) hours as needed for moderate pain or severe pain. Must last 30 days.    Dispense:  120 tablet    Refill:  0    Chronic Pain: STOP Act (Not applicable) Fill 1 day early if closed on refill date. Avoid benzodiazepines within 8 hours of opioids   oxyCODONE-acetaminophen (PERCOCET) 7.5-325 MG tablet    Sig: Take 1 tablet by mouth every 6 (six) hours as needed for moderate pain or severe pain. Must last 30 days.    Dispense:  120 tablet    Refill:  0    Chronic Pain: STOP Act (Not applicable) Fill 1 day early if closed on refill date. Avoid benzodiazepines within 8 hours of opioids   Orders:  No orders of the defined types were placed in this encounter.  Follow-up plan:   Return in about 3 months (around 02/13/2023) for MM, F2F.     Recent  Visits Date Type Provider Dept  10/29/22 Office Visit Edward Jolly, MD [facility]  10/01/22 Procedure visit Edward Jolly, MD [facility]  09/12/22 Office Visit Edward Jolly, MD [facility]  Showing recent visits within past 90 days and meeting all other requirements Today's Visits Date Type Provider Dept  11/14/22 Office Visit Edward Jolly, MD [facility]  Showing today's visits and meeting all other requirements Future Appointments Date Type Provider Dept  02/06/23 Appointment Edward Jolly, MD [facility]  Showing future appointments within next 90 days and meeting all other requirements  I discussed the assessment and treatment plan with the patient. The patient was provided an opportunity to ask questions and all were answered. The patient agreed with the plan and demonstrated an understanding of the instructions.  Patient advised to call back or seek an in-person evaluation if the symptoms or condition worsens.  Duration of encounter: .  Total time on encounter, as per AMA guidelines included both the face-to-face and non-face-to-face time personally spent by the physician and/or other qualified health care professional(s) on the day of the encounter (includes time in activities that require the physician or other qualified health care professional and does not include time in activities normally performed by clinical staff). Physician's time may include the following activities when performed: Preparing to see the patient (e.g., pre-charting review of records, searching for previously ordered imaging, lab work, and nerve conduction tests) Review of prior analgesic pharmacotherapies. Reviewing PMP Interpreting ordered tests (e.g., lab work, imaging, nerve conduction tests) Performing post-procedure evaluations, including interpretation of diagnostic procedures Obtaining and/or reviewing separately obtained  history Performing a medically appropriate examination and/or evaluation Counseling and educating the patient/family/caregiver Ordering medications, tests, or procedures Referring and communicating with other health care professionals (when not separately reported) Documenting clinical information in the electronic or other health record Independently interpreting results (not separately reported) and communicating results to the patient/ family/caregiver Care  coordination (not separately reported)  Note by: Edward Jolly, MD Date: 11/14/2022; Time: 3:00 PM

## 2022-11-14 NOTE — Progress Notes (Signed)
Called current pharmacy to discontinue 2 prescriptions for Percocet 5mg . New dose ordered.  Spoke with tech and then was transferred to pharm 24 min 38 sec. Before I wa able to cancel prescriptions

## 2022-12-04 ENCOUNTER — Other Ambulatory Visit: Payer: Self-pay | Admitting: Student in an Organized Health Care Education/Training Program

## 2022-12-04 DIAGNOSIS — H5316 Psychophysical visual disturbances: Secondary | ICD-10-CM

## 2022-12-04 DIAGNOSIS — G894 Chronic pain syndrome: Secondary | ICD-10-CM

## 2022-12-04 DIAGNOSIS — M47816 Spondylosis without myelopathy or radiculopathy, lumbar region: Secondary | ICD-10-CM

## 2022-12-04 DIAGNOSIS — M533 Sacrococcygeal disorders, not elsewhere classified: Secondary | ICD-10-CM

## 2022-12-05 ENCOUNTER — Telehealth: Payer: Self-pay | Admitting: Student in an Organized Health Care Education/Training Program

## 2022-12-05 ENCOUNTER — Other Ambulatory Visit: Payer: Self-pay

## 2022-12-05 DIAGNOSIS — H5316 Psychophysical visual disturbances: Secondary | ICD-10-CM

## 2022-12-05 DIAGNOSIS — M47816 Spondylosis without myelopathy or radiculopathy, lumbar region: Secondary | ICD-10-CM

## 2022-12-05 DIAGNOSIS — M533 Sacrococcygeal disorders, not elsewhere classified: Secondary | ICD-10-CM

## 2022-12-05 DIAGNOSIS — G894 Chronic pain syndrome: Secondary | ICD-10-CM

## 2022-12-05 MED ORDER — METHOCARBAMOL 500 MG PO TABS: 500.0000 mg | ORAL_TABLET | Freq: Three times a day (TID) | ORAL | 5 refills | Status: DC | PRN

## 2022-12-05 NOTE — Telephone Encounter (Signed)
Patient states her muscle relaxer was not refilled on last visit Methocarbamol. She is out and wants to see if Dr Cherylann Ratel will send refills for this. Please advise patient

## 2022-12-05 NOTE — Telephone Encounter (Signed)
Refill request sent to Dr Cherylann Ratel.

## 2022-12-10 ENCOUNTER — Encounter: Payer: Medicare PPO | Admitting: Student in an Organized Health Care Education/Training Program

## 2022-12-13 ENCOUNTER — Other Ambulatory Visit: Payer: Self-pay | Admitting: Student in an Organized Health Care Education/Training Program

## 2022-12-13 DIAGNOSIS — M47816 Spondylosis without myelopathy or radiculopathy, lumbar region: Secondary | ICD-10-CM

## 2022-12-13 DIAGNOSIS — G894 Chronic pain syndrome: Secondary | ICD-10-CM

## 2022-12-13 DIAGNOSIS — M533 Sacrococcygeal disorders, not elsewhere classified: Secondary | ICD-10-CM

## 2022-12-13 DIAGNOSIS — H5316 Psychophysical visual disturbances: Secondary | ICD-10-CM

## 2022-12-18 ENCOUNTER — Telehealth: Payer: Self-pay | Admitting: Student in an Organized Health Care Education/Training Program

## 2022-12-18 ENCOUNTER — Other Ambulatory Visit: Payer: Self-pay

## 2022-12-18 DIAGNOSIS — M533 Sacrococcygeal disorders, not elsewhere classified: Secondary | ICD-10-CM

## 2022-12-18 DIAGNOSIS — M47816 Spondylosis without myelopathy or radiculopathy, lumbar region: Secondary | ICD-10-CM

## 2022-12-18 DIAGNOSIS — H5316 Psychophysical visual disturbances: Secondary | ICD-10-CM

## 2022-12-18 DIAGNOSIS — G894 Chronic pain syndrome: Secondary | ICD-10-CM

## 2022-12-18 MED ORDER — GABAPENTIN 600 MG PO TABS
600.0000 mg | ORAL_TABLET | Freq: Three times a day (TID) | ORAL | 0 refills | Status: DC
Start: 2022-12-18 — End: 2023-01-21

## 2022-12-18 NOTE — Telephone Encounter (Signed)
Patient is out of gabapentin. This was not filled at her last visit. Can we ask one of the phys to send a script in for this medication ? Please advise patient

## 2022-12-19 ENCOUNTER — Encounter: Payer: Self-pay | Admitting: Internal Medicine

## 2023-01-01 ENCOUNTER — Ambulatory Visit: Admit: 2023-01-01 | Payer: Medicare PPO | Admitting: Internal Medicine

## 2023-01-01 HISTORY — DX: Unspecified urinary incontinence: R32

## 2023-01-01 HISTORY — DX: Myalgia, other site: M79.18

## 2023-01-01 HISTORY — DX: Unspecified dyspareunia: N94.10

## 2023-01-01 HISTORY — DX: Anal fissure, unspecified: K60.2

## 2023-01-01 HISTORY — DX: Age-related nuclear cataract, unspecified eye: H25.10

## 2023-01-01 HISTORY — DX: Palpitations: R00.2

## 2023-01-01 HISTORY — DX: Other allergic rhinitis: J30.89

## 2023-01-01 HISTORY — DX: Restless legs syndrome: G25.81

## 2023-01-01 HISTORY — DX: Visual hallucinations: R44.1

## 2023-01-01 HISTORY — DX: Age-related osteoporosis without current pathological fracture: M81.0

## 2023-01-01 HISTORY — DX: Psychophysical visual disturbances: H53.16

## 2023-01-01 HISTORY — DX: Opioid use, unspecified, uncomplicated: F11.90

## 2023-01-01 HISTORY — DX: Hereditary and idiopathic neuropathy, unspecified: G60.9

## 2023-01-01 HISTORY — DX: Claustrophobia: F40.240

## 2023-01-01 HISTORY — DX: Bilateral primary osteoarthritis of knee: M17.0

## 2023-01-01 HISTORY — DX: Nausea: R11.0

## 2023-01-01 SURGERY — COLONOSCOPY WITH PROPOFOL
Anesthesia: General

## 2023-01-13 ENCOUNTER — Other Ambulatory Visit: Payer: Self-pay | Admitting: Pain Medicine

## 2023-01-13 ENCOUNTER — Telehealth: Payer: Self-pay | Admitting: Student in an Organized Health Care Education/Training Program

## 2023-01-13 DIAGNOSIS — H5316 Psychophysical visual disturbances: Secondary | ICD-10-CM

## 2023-01-13 DIAGNOSIS — M533 Sacrococcygeal disorders, not elsewhere classified: Secondary | ICD-10-CM

## 2023-01-13 DIAGNOSIS — M47816 Spondylosis without myelopathy or radiculopathy, lumbar region: Secondary | ICD-10-CM

## 2023-01-13 DIAGNOSIS — G894 Chronic pain syndrome: Secondary | ICD-10-CM

## 2023-01-13 NOTE — Telephone Encounter (Signed)
Refill request sent to Dr Cherylann Ratel.

## 2023-01-13 NOTE — Telephone Encounter (Signed)
Patient called stating she needs script for gabapentin sent in. Dr Laban Emperor sent in 1 month while Dr Cherylann Ratel was out. Patient has a MM appt Dec 19. Please ask Dr Cherylann Ratel to sent 1 script for gabapentin.

## 2023-01-14 ENCOUNTER — Telehealth: Payer: Self-pay | Admitting: Student in an Organized Health Care Education/Training Program

## 2023-01-14 ENCOUNTER — Other Ambulatory Visit: Payer: Self-pay | Admitting: Student in an Organized Health Care Education/Training Program

## 2023-01-14 DIAGNOSIS — G894 Chronic pain syndrome: Secondary | ICD-10-CM

## 2023-01-14 DIAGNOSIS — H5316 Psychophysical visual disturbances: Secondary | ICD-10-CM

## 2023-01-14 DIAGNOSIS — M47816 Spondylosis without myelopathy or radiculopathy, lumbar region: Secondary | ICD-10-CM

## 2023-01-14 DIAGNOSIS — M533 Sacrococcygeal disorders, not elsewhere classified: Secondary | ICD-10-CM

## 2023-01-14 NOTE — Telephone Encounter (Signed)
Refill request has been sent to Dr Cherylann Ratel

## 2023-01-14 NOTE — Telephone Encounter (Signed)
Patient is calling about her medications that were supposed to be sent in.

## 2023-01-20 ENCOUNTER — Telehealth: Payer: Self-pay | Admitting: Student in an Organized Health Care Education/Training Program

## 2023-01-20 NOTE — Telephone Encounter (Signed)
Attempted to call pat no answer/voice mail full. Patient called I told her that request was sent to Dr Cherylann Ratel last week. Patient with understaning. Told patient to clear mailbox so we could leave a message.

## 2023-01-20 NOTE — Telephone Encounter (Signed)
Patient called stated that her gabapentin still hadn't been send into her pharmacy. PT stated that she called last week to get it to be send in. Please give patient a call. TY

## 2023-01-21 ENCOUNTER — Other Ambulatory Visit: Payer: Self-pay

## 2023-01-21 ENCOUNTER — Telehealth: Payer: Self-pay

## 2023-01-21 DIAGNOSIS — G894 Chronic pain syndrome: Secondary | ICD-10-CM

## 2023-01-21 DIAGNOSIS — M533 Sacrococcygeal disorders, not elsewhere classified: Secondary | ICD-10-CM

## 2023-01-21 DIAGNOSIS — H5316 Psychophysical visual disturbances: Secondary | ICD-10-CM

## 2023-01-21 DIAGNOSIS — M47816 Spondylosis without myelopathy or radiculopathy, lumbar region: Secondary | ICD-10-CM

## 2023-01-21 MED ORDER — GABAPENTIN 600 MG PO TABS: 600.0000 mg | ORAL_TABLET | Freq: Three times a day (TID) | ORAL | 0 refills | Status: DC

## 2023-01-21 NOTE — Telephone Encounter (Signed)
Called patient to let her know that Dr Cherylann Ratel sent in a prescription. No answer and VV full.

## 2023-02-06 ENCOUNTER — Encounter: Payer: Self-pay | Admitting: Student in an Organized Health Care Education/Training Program

## 2023-02-06 ENCOUNTER — Ambulatory Visit
Payer: Medicare PPO | Attending: Student in an Organized Health Care Education/Training Program | Admitting: Student in an Organized Health Care Education/Training Program

## 2023-02-06 DIAGNOSIS — G894 Chronic pain syndrome: Secondary | ICD-10-CM | POA: Diagnosis present

## 2023-02-06 DIAGNOSIS — M47816 Spondylosis without myelopathy or radiculopathy, lumbar region: Secondary | ICD-10-CM | POA: Diagnosis present

## 2023-02-06 DIAGNOSIS — H5316 Psychophysical visual disturbances: Secondary | ICD-10-CM | POA: Insufficient documentation

## 2023-02-06 DIAGNOSIS — M533 Sacrococcygeal disorders, not elsewhere classified: Secondary | ICD-10-CM | POA: Insufficient documentation

## 2023-02-06 MED ORDER — GABAPENTIN 600 MG PO TABS
600.0000 mg | ORAL_TABLET | Freq: Three times a day (TID) | ORAL | 5 refills | Status: DC
Start: 2023-02-06 — End: 2023-07-30

## 2023-02-06 MED ORDER — OXYCODONE-ACETAMINOPHEN 7.5-325 MG PO TABS
1.0000 | ORAL_TABLET | Freq: Four times a day (QID) | ORAL | 0 refills | Status: AC | PRN
Start: 2023-02-11 — End: 2023-03-13

## 2023-02-06 MED ORDER — OXYCODONE-ACETAMINOPHEN 7.5-325 MG PO TABS
1.0000 | ORAL_TABLET | Freq: Four times a day (QID) | ORAL | 0 refills | Status: AC | PRN
Start: 2023-03-13 — End: 2023-04-12

## 2023-02-06 MED ORDER — OXYCODONE-ACETAMINOPHEN 7.5-325 MG PO TABS
1.0000 | ORAL_TABLET | Freq: Four times a day (QID) | ORAL | 0 refills | Status: DC | PRN
Start: 2023-04-12 — End: 2023-05-06

## 2023-02-06 MED ORDER — METHOCARBAMOL 500 MG PO TABS: 500.0000 mg | ORAL_TABLET | Freq: Three times a day (TID) | ORAL | 5 refills | Status: DC | PRN

## 2023-02-06 NOTE — Progress Notes (Signed)
Nursing Pain Medication Assessment:  Safety precautions to be maintained throughout the outpatient stay will include: orient to surroundings, keep bed in low position, maintain call bell within reach at all times, provide assistance with transfer out of bed and ambulation.  Medication Inspection Compliance: Pill count conducted under aseptic conditions, in front of the patient. Neither the pills nor the bottle was removed from the patient's sight at any time. Once count was completed pills were immediately returned to the patient in their original bottle.  Medication: Oxycodone/APAP Pill/Patch Count: 13/120 Pill/Patch Appearance: Markings consistent with prescribed medication Bottle Appearance: Standard pharmacy container. Clearly labeled. Filled Date: 61 / 25 / 2024 Last Medication intake:  Today

## 2023-02-06 NOTE — Progress Notes (Deleted)
PROVIDER NOTE: Information contained herein reflects review and annotations entered in association with encounter. Interpretation of such information and data should be left to medically-trained personnel. Information provided to patient can be located elsewhere in the medical record under "Patient Instructions". Document created using STT-dictation technology, any transcriptional errors that may result from process are unintentional.    Patient: Sandra [name]  Service Category: E/M  Provider: Edward Jolly, MD  DOB: [birth year]  DOS: 02/06/2023  Referring Provider: Gracelyn Nurse, MD  MRN: [id number]  Specialty: Interventional Pain Management  PCP: Gracelyn Nurse, MD  Type: Established Patient  Setting: Ambulatory outpatient    Location: Office  Delivery: Face-to-face     HPI  Sandra [name], a 67 y.o. year old female, is here today because of her No primary diagnosis found.. Sandra [name]'s primary complain today is Back Pain  Pertinent problems: [name] has Long term current use of opiate analgesic; Long term prescription opiate use; Opiate use; Hallucinations, visual; Psychiatric disorder; Chronic low back pain (Location of Primary Source of Pain) (Bilateral) (R>L); Chronic bilateral knee pain; Opiate dependence (HCC); Lumbar facet arthropathy; Lumbar spondylosis; Lumbar facet syndrome (Bilateral) (R>L); Diffuse myofascial pain syndrome; Sandra [name] Syndrome (CBS); Arthropathy of right elbow; and Localized osteoarthritis of right shoulder on their pertinent problem list. Pain Assessment: Severity of Chronic pain is reported as a 7 /10. Location: Back Lower/down legs to feet bilat. Onset: More than a month ago. Quality: Lambert Mody, Aching. Timing: Constant. Modifying factor(s): meds. Vitals:  height is 5\' 1"  (1.549 m) and weight is 146 lb (66.2 kg). Her temperature is 97.5 F (36.4 C) (abnormal). Her blood pressure is 143/69 (abnormal) and her pulse is 69. Her respiration is 16 and oxygen  saturation is 93%.  BMI: Estimated body mass index is 27.59 kg/m as calculated from the following:   Height as of this encounter: 5\' 1"  (1.549 m).   Weight as of this encounter: 146 lb (66.2 kg). Last encounter: 06/18/2022. Last procedure: 07/10/2022.  Reason for encounter: medication management and worsening low back pain  Presents today with increased low back pain that is worse with lumbar extension.  She is requesting an increase in her Oxycodone given difficult to management pain She states that she is having difficulty performing ADLs given her low back pain.  She's received short term benefit with lumbar transforaminal epidural steroid injection.  Pharmacotherapy Assessment  Analgesic:  Percocet  7.5 mg every 6 hours as needed, quantity 120/month; MME equals 45.    Monitoring: [place] PMP: PDMP not reviewed this encounter.       Pharmacotherapy: No side-effects or adverse reactions reported. Compliance: No problems identified. Effectiveness: Clinically acceptable.  Nonah Mattes, RN  02/06/2023  2:37 PM  Sign when Signing Visit Nursing Pain Medication Assessment:  Safety precautions to be maintained throughout the outpatient stay will include: orient to surroundings, keep bed in low position, maintain call bell within reach at all times, provide assistance with transfer out of bed and ambulation.  Medication Inspection Compliance: Pill count conducted under aseptic conditions, in front of the patient. Neither the pills nor the bottle was removed from the patient's sight at any time. Once count was completed pills were immediately returned to the patient in their original bottle.  Medication: Oxycodone/APAP Pill/Patch Count: 13/120 Pill/Patch Appearance: Markings consistent with prescribed medication Bottle Appearance: Standard pharmacy container. Clearly labeled. Filled Date: 30 / 25 / 2024 Last Medication intake:  Today  No results found for: "CBDTHCR" No results found for:  "  D8THCCBX" No results found for: "D9THCCBX"  UDS:  Summary  Date Value Ref Range Status  03/07/2022 Note  Final    Comment:    ==================================================================== [employer] Select 13 (MW) ==================================================================== Test                             Result       Flag       Units  Drug Present and Declared for Prescription Verification   Hydrocodone                    3984         EXPECTED   ng/mg creat   Hydromorphone                  412          EXPECTED   ng/mg creat   Dihydrocodeine                 435          EXPECTED   ng/mg creat   Norhydrocodone                 >3030        EXPECTED   ng/mg creat    Sources of hydrocodone include scheduled prescription medications.    Hydromorphone, dihydrocodeine and norhydrocodone are expected    metabolites of hydrocodone. Hydromorphone and dihydrocodeine are    also available as scheduled prescription medications.  ==================================================================== Test                      Result    Flag   Units      Ref Range   Creatinine              165              mg/dL      >=16 ==================================================================== Declared Medications:  The flagging and interpretation on this report are based on the  following declared medications.  Unexpected results may arise from  inaccuracies in the declared medications.   **Note: The testing scope of this panel includes these medications:   Hydrocodone (Norco)   **Note: The testing scope of this panel does not include the  following reported medications:   Acetaminophen (Tylenol)  Acetaminophen (Norco)  Albuterol (Ventolin HFA)  Amitriptyline (Elavil)  Amlodipine (Norvasc)  Aspirin  Darbepoetin Alfa  Esomeprazole (Nexium)  Fluticasone (Flonase)  Gabapentin (Neurontin)  Melatonin  Methocarbamol (Robaxin)  Montelukast (Singulair)  Promethazine  (Phenergan)  Quetiapine (Seroquel)  Simvastatin (Zocor)  Valsartan (Diovan) ==================================================================== For clinical consultation, please call [phone]. ====================================================================       ROS  Constitutional: Denies any fever or chills Gastrointestinal: No reported hemesis, hematochezia, vomiting, or acute GI distress Musculoskeletal:  Low back pain Neurological: No reported episodes of acute onset apraxia, aphasia, dysarthria, agnosia, amnesia, paralysis, loss of coordination, or loss of consciousness  Medication Review  Melatonin, QUEtiapine, acetaminophen, amLODipine, amitriptyline, aspirin, esomeprazole, fluticasone, gabapentin, hydrochlorothiazide, meloxicam, methocarbamol, mirtazapine, montelukast, ondansetron, oxyCODONE-acetaminophen, promethazine, rOPINIRole, sertraline, simvastatin, and valsartan  History Review  Allergy: Ms. Loeber is allergic to trazodone, codeine, cortisone, and zolpidem. Drug: Ms. Mcnaney  reports no history of drug use. Alcohol:  reports no history of alcohol use. Tobacco:  reports that she quit smoking about 49 years ago. Her smoking use included cigarettes. She has never used smokeless tobacco. Social: Ms. Gorra  reports that she quit  smoking about 49 years ago. Her smoking use included cigarettes. She has never used smokeless tobacco. She reports that she does not drink alcohol and does not use drugs. Medical:  has a past medical history of Anal fissure, Anemia, Anxiety (08/01/2014), Arthritis, degenerative (07/30/2013), Sandra [name] syndrome, Claustrophobia, Clinical depression (06/30/2014), Daily nausea, Depression, Diffuse myofascial pain syndrome, Dyspareunia, female, Elevated liver enzymes, Elevated liver enzymes, GERD (gastroesophageal reflux disease), H/O breast biopsy (01/05/2015), H/O: attempted suicide (2013), Hepatitis B, Hereditary and idiopathic  peripheral neuropathy, Hiatal hernia, Hiatal hernia, History of hysterectomy (01/05/2015), History of peptic ulcer disease (01/05/2015), Hypercholesteremia, Hypertension, Incontinence, Legally blind, Non-seasonal allergic rhinitis, Opiate use, Osteoarthritis of both knees, Palpitations, Panic attacks, Postmenopausal osteoporosis, PUD (peptic ulcer disease), Rectocele (01/05/2015), Rectocele, Restless leg syndrome, Retinitis pigmentosa, S/P cholecystectomy (01/05/2015), Senile nuclear sclerosis, and Visual hallucinations. Surgical: Ms. Kelty  has a past surgical history that includes Cholecystectomy; Rectocele repair; Abdominal hysterectomy; Appendectomy; Colonoscopy; Esophagogastroduodenoscopy; Sphincterotomy (N/A, 01/24/2017); Hemorrhoid surgery (N/A, 03/07/2017); Hernia repair; Esophagogastroduodenoscopy (egd) with propofol (N/A, 09/17/2017); Oophorectomy; and Breast biopsy (Bilateral, 1977). Family: family history includes Bone cancer in her maternal uncle; Breast cancer in her sister; Breast cancer (age of onset: 104) in her mother; Cancer in her father; Dementia in her mother; Diabetes in her mother; Hypertension in her mother; Kidney cancer in her sister; Leukemia in her paternal grandfather and paternal grandmother; Pancreatic cancer in her maternal aunt; Prostate cancer (age of onset: 38) in her father.  Laboratory Chemistry Profile   Renal Lab Results  Component Value Date   BUN 15 02/22/2022   CREATININE 0.65 02/22/2022   GFRAA >60 01/16/2017   GFRNONAA >60 02/22/2022    Hepatic Lab Results  Component Value Date   AST 29 02/22/2022   ALT 28 02/22/2022   ALBUMIN 4.9 02/22/2022   ALKPHOS 60 02/22/2022   HCVAB NON REACTIVE 06/26/2021   LIPASE 38 02/22/2022   AMMONIA 31 09/28/2020    Electrolytes Lab Results  Component Value Date   NA 140 02/22/2022   K 3.5 02/22/2022   CL 104 02/22/2022   CALCIUM 9.4 02/22/2022   MG 2.0 09/27/2020    Bone Lab Results  Component Value Date    25OHVITD1 19 (L) 02/07/2015   25OHVITD2 3.9 02/07/2015   25OHVITD3 15 02/07/2015    Inflammation (CRP: Acute Phase) (ESR: Chronic Phase) Lab Results  Component Value Date   CRP 1.3 (H) 09/27/2020   ESRSEDRATE 26 02/07/2015   LATICACIDVEN 1.1 09/27/2020         Note: Above Lab results reviewed.  Recent Imaging Review  DG PAIN CLINIC C-ARM 1-60 MIN NO REPORT Fluoro was used, but no Radiologist interpretation will be provided.  Please refer to "NOTES" tab for provider progress note.   Narrative & Impression  CLINICAL DATA:  Chronic low back pain   EXAM: LUMBAR SPINE - COMPLETE WITH BENDING VIEWS   COMPARISON:  06/05/2019   FINDINGS: There is no evidence of lumbar spine fracture. Alignment is normal. Intervertebral disc spaces are maintained. No significant change in alignment between flexion extension positioning limits assessment for ligamentous instability.  Facet hypertrophy noted at L3-L4, L4-L5 degenerative disc disease present.   IMPRESSION: 1. No acute fracture or listhesis.   Note: Reviewed        Physical Exam  General appearance: Well nourished, well developed, and well hydrated. In no apparent acute distress Mental status: Alert, oriented x 3 (person, place, & time)       Respiratory: No evidence of acute respiratory distress  Eyes: PERLA Vitals: BP (!) 143/69   Pulse 69   Temp (!) 97.5 F (36.4 C)   Resp 16   Ht 5\' 1"  (1.549 m)   Wt 146 lb (66.2 kg)   SpO2 93%   BMI 27.59 kg/m  BMI: Estimated body mass index is 27.59 kg/m as calculated from the following:   Height as of this encounter: 5\' 1"  (1.549 m).   Weight as of this encounter: 146 lb (66.2 kg). Ideal: Ideal body weight: 47.8 kg (105 lb 6.1 oz) Adjusted ideal body weight: 55.2 kg (121 lb 10 oz)   Thoracic Spine Area Exam  Skin & Axial Inspection: No masses, redness, or swelling Alignment: Symmetrical Functional ROM: Unrestricted ROM Stability: No instability detected Muscle  Tone/Strength: Functionally intact. No obvious neuro-muscular anomalies detected. Sensory (Neurological): Unimpaired Muscle strength & Tone: No palpable anomalies  Lumbar Spine Area Exam  Skin & Axial Inspection: No masses, redness, or swelling Alignment: Symmetrical Functional ROM: Pain restricted ROM affecting both sides Stability: No instability detected Muscle Tone/Strength: Functionally intact. No obvious neuro-muscular anomalies detected. Sensory (Neurological): Musculoskeletal pain pattern Palpation: Complains of area being tender to palpation       Provocative Tests: Hyperextension/rotation test: (+) bilaterally for facet joint pain. Lumbar quadrant test (Kemp's test): (+) bilaterally for facet joint pain.  Lower Extremity Exam    Side: Right lower extremity  Side: Left lower extremity  Stability: No instability observed          Stability: No instability observed          Skin & Extremity Inspection: Skin color, temperature, and hair growth are WNL. No peripheral edema or cyanosis. No masses, redness, swelling, asymmetry, or associated skin lesions. No contractures.  Skin & Extremity Inspection: Skin color, temperature, and hair growth are WNL. No peripheral edema or cyanosis. No masses, redness, swelling, asymmetry, or associated skin lesions. No contractures.  Functional ROM: Pain restricted ROM for knee joint          Functional ROM: Pain restricted ROM for knee joint          Muscle Tone/Strength: Functionally intact. No obvious neuro-muscular anomalies detected.  Muscle Tone/Strength: Functionally intact. No obvious neuro-muscular anomalies detected.  Sensory (Neurological): Unimpaired        Sensory (Neurological): Unimpaired        DTR: Patellar: deferred today Achilles: deferred today Plantar: deferred today  DTR: Patellar: deferred today Achilles: deferred today Plantar: deferred today  Palpation: No palpable anomalies  Palpation: No palpable anomalies     Assessment   Diagnosis Status  No diagnosis found.  Having a Flare-up Having a Flare-up Controlled    Plan of Care   Reviewed risks of chronic opioid therapy with patient.  Informed the patient that we will avoid any further dose escalation after this.  She was requesting an increase to 10 mg to help manage her pain.  We will increase from 5 mg to 7.5 mg every 6 hours as needed.  She is to continue home multimodal analgesics including amitriptyline 25 mg nightly, gabapentin 600 mg every 8 hours, and Robaxin 500 mg every 8 hours as needed.  She also is on Requip for restless leg syndrome.   Pharmacotherapy (Medications Ordered): No orders of the defined types were placed in this encounter.  Orders:  No orders of the defined types were placed in this encounter.  Follow-up plan:   No follow-ups on file.     Recent Visits Date Type Provider Dept  11/14/22  Office Visit Edward Jolly, MD [facility]  Showing recent visits within past 90 days and meeting all other requirements Today's Visits Date Type Provider Dept  02/06/23 Office Visit Edward Jolly, MD [facility]  Showing today's visits and meeting all other requirements Future Appointments No visits were found meeting these conditions. Showing future appointments within next 90 days and meeting all other requirements  I discussed the assessment and treatment plan with the patient. The patient was provided an opportunity to ask questions and all were answered. The patient agreed with the plan and demonstrated an understanding of the instructions.  Patient advised to call back or seek an in-person evaluation if the symptoms or condition worsens.  Duration of encounter: .  Total time on encounter, as per AMA guidelines included both the face-to-face and non-face-to-face time personally spent by the physician and/or other qualified health care professional(s) on the day of the encounter (includes  time in activities that require the physician or other qualified health care professional and does not include time in activities normally performed by clinical staff). Physician's time may include the following activities when performed: Preparing to see the patient (e.g., pre-charting review of records, searching for previously ordered imaging, lab work, and nerve conduction tests) Review of prior analgesic pharmacotherapies. Reviewing PMP Interpreting ordered tests (e.g., lab work, imaging, nerve conduction tests) Performing post-procedure evaluations, including interpretation of diagnostic procedures Obtaining and/or reviewing separately obtained history Performing a medically appropriate examination and/or evaluation Counseling and educating the patient/family/caregiver Ordering medications, tests, or procedures Referring and communicating with other health care professionals (when not separately reported) Documenting clinical information in the electronic or other health record Independently interpreting results (not separately reported) and communicating results to the patient/ family/caregiver Care coordination (not separately reported)  Note by: Edward Jolly, MD Date: 02/06/2023; Time: 2:51 PMPROVIDER NOTE: Information contained herein reflects review and annotations entered in association with encounter. Interpretation of such information and data should be left to medically-trained personnel. Information provided to patient can be located elsewhere in the medical record under "Patient Instructions". Document created using STT-dictation technology, any transcriptional errors that may result from process are unintentional.    Patient: Sandra [name]  Service Category: E/M  Provider: Edward Jolly, MD  DOB: 06/07/1955  DOS: 02/06/2023  Referring Provider: Gracelyn Nurse, MD  MRN: [id number]  Specialty: Interventional Pain Management  PCP: Gracelyn Nurse, MD  Type: Established Patient   Setting: Ambulatory outpatient    Location: Office  Delivery: Face-to-face     HPI  Ms. Yuxi Panetti, a 67 y.o. year old female, is here today because of her No primary diagnosis found.. Ms. Mckercher's primary complain today is Back Pain  Pertinent problems: Ms. Brownback has Long term current use of opiate analgesic; Long term prescription opiate use; Opiate use; Hallucinations, visual; Psychiatric disorder; Chronic low back pain (Location of Primary Source of Pain) (Bilateral) (R>L); Chronic bilateral knee pain; Opiate dependence (HCC); Lumbar facet arthropathy; Lumbar spondylosis; Lumbar facet syndrome (Bilateral) (R>L); Diffuse myofascial pain syndrome; Sandra [name] Syndrome (CBS); Arthropathy of right elbow; and Localized osteoarthritis of right shoulder on their pertinent problem list. Pain Assessment: Severity of Chronic pain is reported as a 7 /10. Location: Back Lower/down legs to feet bilat. Onset: More than a month ago. Quality: Lambert Mody, Aching. Timing: Constant. Modifying factor(s): meds. Vitals:  height is 5\' 1"  (1.549 m) and weight is 146 lb (66.2 kg). Her temperature is 97.5 F (36.4 C) (abnormal). Her blood pressure is 143/69 (abnormal)  and her pulse is 69. Her respiration is 16 and oxygen saturation is 93%.  BMI: Estimated body mass index is 27.59 kg/m as calculated from the following:   Height as of this encounter: 5\' 1"  (1.549 m).   Weight as of this encounter: 146 lb (66.2 kg). Last encounter: 11/14/2022. Last procedure: 10/01/2022.  Reason for encounter: {Blank single:19197::"medication management","post-procedure evaluation and assessment","both, medication management and post-procedure evaluation and assessment","evaluation of worsening, or previously known (established) problem","patient-requested evaluation","follow-up evaluation","evaluation for possible interventional PM therapy/treatment","evaluation of new problem"," *** "}. ***  Discussed the use of AI scribe software  for clinical note transcription with the patient, who gave verbal consent to proceed.  History of Present Illness           Pharmacotherapy Assessment  Analgesic: Percocet  7.5 mg every 6 hours as needed, quantity 120/month; MME equals 45.    Monitoring: [place] PMP: PDMP not reviewed this encounter. {Blank single:19197::"Unable to conduct review of the controlled substance reporting system due to technological failure.","     "} Pharmacotherapy: {Blank single:19197::"Opioid-induced constipation (OIC)(K59.03, T40.2X5A)","No side-effects or adverse reactions reported."} Compliance: {Blank single:19197::"Medication agreement violation - unsanctioned acquisition/use of additional opioid-containing medication","No problems identified."} Effectiveness: {Blank single:19197::"Clinically acceptable."}  Nonah Mattes, RN  02/06/2023  2:37 PM  Sign when Signing Visit Nursing Pain Medication Assessment:  Safety precautions to be maintained throughout the outpatient stay will include: orient to surroundings, keep bed in low position, maintain call bell within reach at all times, provide assistance with transfer out of bed and ambulation.  Medication Inspection Compliance: Pill count conducted under aseptic conditions, in front of the patient. Neither the pills nor the bottle was removed from the patient's sight at any time. Once count was completed pills were immediately returned to the patient in their original bottle.  Medication: Oxycodone/APAP Pill/Patch Count: 13/120 Pill/Patch Appearance: Markings consistent with prescribed medication Bottle Appearance: Standard pharmacy container. Clearly labeled. Filled Date: 39 / 25 / 2024 Last Medication intake:  Today  No results found for: "CBDTHCR" No results found for: "D8THCCBX" No results found for: "D9THCCBX"  UDS:  Summary  Date Value Ref Range Status  03/07/2022 Note  Final    Comment:     ==================================================================== [employer] Select 13 (MW) ==================================================================== Test                             Result       Flag       Units  Drug Present and Declared for Prescription Verification   Hydrocodone                    3984         EXPECTED   ng/mg creat   Hydromorphone                  412          EXPECTED   ng/mg creat   Dihydrocodeine                 435          EXPECTED   ng/mg creat   Norhydrocodone                 >3030        EXPECTED   ng/mg creat    Sources of hydrocodone include scheduled prescription medications.    Hydromorphone, dihydrocodeine and norhydrocodone are expected    metabolites of hydrocodone. Hydromorphone  and dihydrocodeine are    also available as scheduled prescription medications.  ==================================================================== Test                      Result    Flag   Units      Ref Range   Creatinine              165              mg/dL      >=16 ==================================================================== Declared Medications:  The flagging and interpretation on this report are based on the  following declared medications.  Unexpected results may arise from  inaccuracies in the declared medications.   **Note: The testing scope of this panel includes these medications:   Hydrocodone (Norco)   **Note: The testing scope of this panel does not include the  following reported medications:   Acetaminophen (Tylenol)  Acetaminophen (Norco)  Albuterol (Ventolin HFA)  Amitriptyline (Elavil)  Amlodipine (Norvasc)  Aspirin  Darbepoetin Alfa  Esomeprazole (Nexium)  Fluticasone (Flonase)  Gabapentin (Neurontin)  Melatonin  Methocarbamol (Robaxin)  Montelukast (Singulair)  Promethazine (Phenergan)  Quetiapine (Seroquel)  Simvastatin (Zocor)  Valsartan  (Diovan) ==================================================================== For clinical consultation, please call [phone]. ====================================================================       ROS  Constitutional: {Blank single:19197::"Denies any fever or chills"} Gastrointestinal: {Blank single:19197::"No reported hemesis, hematochezia, vomiting, or acute GI distress"} Musculoskeletal: {Blank single:19197::"Denies any acute onset joint swelling, redness, loss of ROM, or weakness"} Neurological: {Blank single:19197::"No reported episodes of acute onset apraxia, aphasia, dysarthria, agnosia, amnesia, paralysis, loss of coordination, or loss of consciousness"}  Medication Review  Melatonin, QUEtiapine, acetaminophen, amLODipine, amitriptyline, aspirin, esomeprazole, fluticasone, gabapentin, hydrochlorothiazide, meloxicam, methocarbamol, mirtazapine, montelukast, ondansetron, oxyCODONE-acetaminophen, promethazine, rOPINIRole, sertraline, simvastatin, and valsartan  History Review  Allergy: Ms. Pesola is allergic to trazodone, codeine, cortisone, and zolpidem. Drug: Ms. Deel  reports no history of drug use. Alcohol:  reports no history of alcohol use. Tobacco:  reports that she quit smoking about 49 years ago. Her smoking use included cigarettes. She has never used smokeless tobacco. Social: Ms. Leiber  reports that she quit smoking about 49 years ago. Her smoking use included cigarettes. She has never used smokeless tobacco. She reports that she does not drink alcohol and does not use drugs. Medical:  has a past medical history of Anal fissure, Anemia, Anxiety (08/01/2014), Arthritis, degenerative (07/30/2013), Sandra [name] syndrome, Claustrophobia, Clinical depression (06/30/2014), Daily nausea, Depression, Diffuse myofascial pain syndrome, Dyspareunia, female, Elevated liver enzymes, Elevated liver enzymes, GERD (gastroesophageal reflux disease), H/O breast biopsy  (01/05/2015), H/O: attempted suicide (2013), Hepatitis B, Hereditary and idiopathic peripheral neuropathy, Hiatal hernia, Hiatal hernia, History of hysterectomy (01/05/2015), History of peptic ulcer disease (01/05/2015), Hypercholesteremia, Hypertension, Incontinence, Legally blind, Non-seasonal allergic rhinitis, Opiate use, Osteoarthritis of both knees, Palpitations, Panic attacks, Postmenopausal osteoporosis, PUD (peptic ulcer disease), Rectocele (01/05/2015), Rectocele, Restless leg syndrome, Retinitis pigmentosa, S/P cholecystectomy (01/05/2015), Senile nuclear sclerosis, and Visual hallucinations. Surgical: Ms. Martzall  has a past surgical history that includes Cholecystectomy; Rectocele repair; Abdominal hysterectomy; Appendectomy; Colonoscopy; Esophagogastroduodenoscopy; Sphincterotomy (N/A, 01/24/2017); Hemorrhoid surgery (N/A, 03/07/2017); Hernia repair; Esophagogastroduodenoscopy (egd) with propofol (N/A, 09/17/2017); Oophorectomy; and Breast biopsy (Bilateral, 1977). Family: family history includes Bone cancer in her maternal uncle; Breast cancer in her sister; Breast cancer (age of onset: 13) in her mother; Cancer in her father; Dementia in her mother; Diabetes in her mother; Hypertension in her mother; Kidney cancer in her sister; Leukemia in her paternal grandfather and paternal grandmother; Pancreatic cancer in  her maternal aunt; Prostate cancer (age of onset: 61) in her father.  Laboratory Chemistry Profile   Renal Lab Results  Component Value Date   BUN 15 02/22/2022   CREATININE 0.65 02/22/2022   GFRAA >60 01/16/2017   GFRNONAA >60 02/22/2022    Hepatic Lab Results  Component Value Date   AST 29 02/22/2022   ALT 28 02/22/2022   ALBUMIN 4.9 02/22/2022   ALKPHOS 60 02/22/2022   HCVAB NON REACTIVE 06/26/2021   LIPASE 38 02/22/2022   AMMONIA 31 09/28/2020    Electrolytes Lab Results  Component Value Date   NA 140 02/22/2022   K 3.5 02/22/2022   CL 104 02/22/2022   CALCIUM  9.4 02/22/2022   MG 2.0 09/27/2020    Bone Lab Results  Component Value Date   25OHVITD1 19 (L) 02/07/2015   25OHVITD2 3.9 02/07/2015   25OHVITD3 15 02/07/2015    Inflammation (CRP: Acute Phase) (ESR: Chronic Phase) Lab Results  Component Value Date   CRP 1.3 (H) 09/27/2020   ESRSEDRATE 26 02/07/2015   LATICACIDVEN 1.1 09/27/2020         Note: {Blank single:19197::"Ms. Grinnell indicates labs done and monitored by primary care practitioner using a non-[facility] EMR system","No results found under the [facility] electronic medical record","Results made available to patient.","Lab results reviewed and made available to patient.","Lab results reviewed and explained to patient in Layman's terms.","Above Lab results reviewed."}  Recent Imaging Review  DG PAIN CLINIC C-ARM 1-60 MIN NO REPORT Fluoro was used, but no Radiologist interpretation will be provided.  Please refer to "NOTES" tab for provider progress note. Note: {Blank single:19197::"No new results found.","No results found under the [facility] medical record.","Imaging results reviewed and explained to patient in Layman's terms.","Results of ordered imaging test(s) reviewed and explained to patient in Layman's terms.","Imaging results reviewed.","Reviewed"} {Blank single:19197::"Results visible under [facility].","Results visible under [facility].","Results visible under [facility].","Results visible under [facility].","Results visible under "Care Everywhere".","Results made available to patient.","Copy of results provided to patient.","     "}  Physical Exam  General appearance: {general exam:210120802::"Well nourished, well developed, and well hydrated. In no apparent acute distress"} Mental status: {Blank single:19197::"Alert and oriented x 3. Exaggerated physical and/or psychosocial pain behavior perceived.","Alert, oriented x 3 (person, place, & time)"} {Blank single:19197::"Ms. Valido's speech pattern and demeanor  seems to suggest oversedation","     "} Respiratory: {Blank single:19197::"Oxygen-dependent COPD","No evidence of acute respiratory distress"} Eyes: {Blank single:19197::"Miotic (pupilary constriction) due to opiate use","Midriatic","Anisocoric","Evidence of ptosis","Pin-point pupils","PERLA"} Vitals: BP (!) 143/69   Pulse 69   Temp (!) 97.5 F (36.4 C)   Resp 16   Ht 5\' 1"  (1.549 m)   Wt 146 lb (66.2 kg)   SpO2 93%   BMI 27.59 kg/m  BMI: Estimated body mass index is 27.59 kg/m as calculated from the following:   Height as of this encounter: 5\' 1"  (1.549 m).   Weight as of this encounter: 146 lb (66.2 kg). Ideal: Ideal body weight: 47.8 kg (105 lb 6.1 oz) Adjusted ideal body weight: 55.2 kg (121 lb 10 oz)  Assessment   Diagnosis Status  No diagnosis found. {Problem Stability:19197::"Deteriorating","Having a Flare-up","Improved","Improving","Not improving","Not responding","Persistent","Recurring","Reoccurring","Resolved","Responding","Stable","Unchanged","Unimproved","Worsened","Worsening","Controlled"} {Problem Stability:19197::"Deteriorating","Having a Flare-up","Improved","Improving","Not improving","Not responding","Persistent","Recurring","Reoccurring","Resolved","Responding","Stable","Unchanged","Unimproved","Worsened","Worsening","Controlled"} {Problem Stability:19197::"Deteriorating","Having a Flare-up","Improved","Improving","Not improving","Not responding","Persistent","Recurring","Reoccurring","Resolved","Responding","Stable","Unchanged","Unimproved","Worsened","Worsening","Controlled"}   Updated Problems: No problems updated.  Plan of Care  Problem-specific:  Assessment and Plan            Ms. Evelise Childs has a current medication list which includes the following long-term medication(s): amitriptyline, esomeprazole,  gabapentin, mirtazapine, montelukast, promethazine, quetiapine, ropinirole, simvastatin, and valsartan.  Pharmacotherapy (Medications Ordered): No  orders of the defined types were placed in this encounter.  Orders:  No orders of the defined types were placed in this encounter.  Follow-up plan:   No follow-ups on file.      Status post bilateral intra-articular knee steroid, right elbow injection on 04/05/2019.  Status post L4-L5 ESI, right-sided along with right shoulder steroid injection on 06/16/2019, right shoulder injection on 07/28/2019, posterior approach; B/L L4 and L5 TF ESI.  B/L L3,4,5 MBNB #1 10/01/22            Recent Visits Date Type Provider Dept  11/14/22 Office Visit Edward Jolly, MD [facility]  Showing recent visits within past 90 days and meeting all other requirements Today's Visits Date Type Provider Dept  02/06/23 Office Visit Edward Jolly, MD [facility]  Showing today's visits and meeting all other requirements Future Appointments No visits were found meeting these conditions. Showing future appointments within next 90 days and meeting all other requirements  I discussed the assessment and treatment plan with the patient. The patient was provided an opportunity to ask questions and all were answered. The patient agreed with the plan and demonstrated an understanding of the instructions.  Patient advised to call back or seek an in-person evaluation if the symptoms or condition worsens.  Duration of encounter: *** minutes.  Total time on encounter, as per AMA guidelines included both the face-to-face and non-face-to-face time personally spent by the physician and/or other qualified health care professional(s) on the day of the encounter (includes time in activities that require the physician or other qualified health care professional and does not include time in activities normally performed by clinical staff). Physician's time may include the following activities when performed: Preparing to see the patient (e.g., pre-charting review of records, searching for previously ordered imaging,  lab work, and nerve conduction tests) Review of prior analgesic pharmacotherapies. Reviewing PMP Interpreting ordered tests (e.g., lab work, imaging, nerve conduction tests) Performing post-procedure evaluations, including interpretation of diagnostic procedures Obtaining and/or reviewing separately obtained history Performing a medically appropriate examination and/or evaluation Counseling and educating the patient/family/caregiver Ordering medications, tests, or procedures Referring and communicating with other health care professionals (when not separately reported) Documenting clinical information in the electronic or other health record Independently interpreting results (not separately reported) and communicating results to the patient/ family/caregiver Care coordination (not separately reported)  Note by: Edward Jolly, MD Date: 02/06/2023; Time: 2:51 PM

## 2023-02-06 NOTE — Progress Notes (Signed)
PROVIDER NOTE: Information contained herein reflects review and annotations entered in association with encounter. Interpretation of such information and data should be left to medically-trained personnel. Information provided to patient can be located elsewhere in the medical record under "Patient Instructions". Document created using STT-dictation technology, any transcriptional errors that may result from process are unintentional.    Patient: Sandra [name]  Service Category: E/M  Provider: Edward Jolly, MD  DOB: 01-03-1956  DOS: 02/06/2023  Referring Provider: Gracelyn Nurse, MD  MRN: [id number]  Specialty: Interventional Pain Management  PCP: Gracelyn Nurse, MD  Type: Established Patient  Setting: Ambulatory outpatient    Location: Office  Delivery: Face-to-face     HPI  Sandra [name], a 67 y.o. year old female, is here today because of her No primary diagnosis found.. Sandra [name]'s primary complain today is Back Pain  Pertinent problems: Sandra [name] has Long term current use of opiate analgesic; Long term prescription opiate use; Opiate use; Hallucinations, visual; Psychiatric disorder; Chronic low back pain (Location of Primary Source of Pain) (Bilateral) (R>L); Chronic bilateral knee pain; Opiate dependence (HCC); Lumbar facet arthropathy; Lumbar spondylosis; Lumbar facet syndrome (Bilateral) (R>L); Diffuse myofascial pain syndrome; [name] Syndrome (CBS); Arthropathy of right elbow; and Localized osteoarthritis of right shoulder on their pertinent problem list. Pain Assessment: Severity of Chronic pain is reported as a 7 /10. Location: Back Lower/down legs to feet bilat. Onset: More than a month ago. Quality: Lambert Mody, Aching. Timing: Constant. Modifying factor(s): meds. Vitals:  height is 5\' 1"  (1.549 m) and weight is 146 lb (66.2 kg). Her temperature is 97.5 F (36.4 C) (abnormal). Her blood pressure is 143/69 (abnormal) and her pulse is 69. Her respiration is 16 and oxygen  saturation is 93%.  BMI: Estimated body mass index is 27.59 kg/m as calculated from the following:   Height as of this encounter: 5\' 1"  (1.549 m).   Weight as of this encounter: 146 lb (66.2 kg). Last encounter: 11/14/2022. Last procedure: 10/01/2022.  Reason for encounter: medication management.  No change in medical history since last visit otherthan fall yesterday from her bed.  Increased low back pain, will continue to monitor, if it doesn't improve will obtain imaging.  Patient continues multimodal pain regimen as prescribed.  States that it provides pain relief and improvement in functional status.    Pharmacotherapy Assessment  Analgesic: Percocet  7.5 mg every 6 hours as needed, quantity 120/month; MME equals 45.    Monitoring:  PMP: PDMP reviewed during this encounter.       Pharmacotherapy: No side-effects or adverse reactions reported. Compliance: No problems identified. Effectiveness: Clinically acceptable.  Nonah Mattes, RN  02/06/2023  2:37 PM  Sign when Signing Visit Nursing Pain Medication Assessment:  Safety precautions to be maintained throughout the outpatient stay will include: orient to surroundings, keep bed in low position, maintain call bell within reach at all times, provide assistance with transfer out of bed and ambulation.  Medication Inspection Compliance: Pill count conducted under aseptic conditions, in front of the patient. Neither the pills nor the bottle was removed from the patient's sight at any time. Once count was completed pills were immediately returned to the patient in their original bottle.  Medication: Oxycodone/APAP Pill/Patch Count: 13/120 Pill/Patch Appearance: Markings consistent with prescribed medication Bottle Appearance: Standard pharmacy container. Clearly labeled. Filled Date: 48 / 25 / 2024 Last Medication intake:  Today  No results found for: "CBDTHCR" No results found for: "D8THCCBX" No results found for: "D9THCCBX"  UDS:   Summary  Date Value Ref Range Status  03/07/2022 Note  Final    Comment:    ==================================================================== [facility] (MW) ==================================================================== Test                             Result       Flag       Units  Drug Present and Declared for Prescription Verification   Hydrocodone                    3984         EXPECTED   ng/mg creat   Hydromorphone                  412          EXPECTED   ng/mg creat   Dihydrocodeine                 435          EXPECTED   ng/mg creat   Norhydrocodone                 >3030        EXPECTED   ng/mg creat    Sources of hydrocodone include scheduled prescription medications.    Hydromorphone, dihydrocodeine and norhydrocodone are expected    metabolites of hydrocodone. Hydromorphone and dihydrocodeine are    also available as scheduled prescription medications.  ==================================================================== Test                      Result    Flag   Units      Ref Range   Creatinine              165              mg/dL      >=96 ==================================================================== Declared Medications:  The flagging and interpretation on this report are based on the  following declared medications.  Unexpected results may arise from  inaccuracies in the declared medications.   **Note: The testing scope of this panel includes these medications:   Hydrocodone (Norco)   **Note: The testing scope of this panel does not include the  following reported medications:   Acetaminophen (Tylenol)  Acetaminophen (Norco)  Albuterol (Ventolin HFA)  Amitriptyline (Elavil)  Amlodipine (Norvasc)  Aspirin  Darbepoetin Alfa  Esomeprazole (Nexium)  Fluticasone (Flonase)  Gabapentin (Neurontin)  Melatonin  Methocarbamol (Robaxin)  Montelukast (Singulair)  Promethazine (Phenergan)  Quetiapine (Seroquel)  Simvastatin (Zocor)   Valsartan (Diovan) ==================================================================== For clinical consultation, please call [phone]. ====================================================================       ROS  Constitutional: Denies any fever or chills Gastrointestinal: No reported hemesis, hematochezia, vomiting, or acute GI distress Musculoskeletal:  +LBP Neurological: No reported episodes of acute onset apraxia, aphasia, dysarthria, agnosia, amnesia, paralysis, loss of coordination, or loss of consciousness  Medication Review  Melatonin, QUEtiapine, acetaminophen, amLODipine, amitriptyline, aspirin, esomeprazole, fluticasone, gabapentin, hydrochlorothiazide, meloxicam, methocarbamol, mirtazapine, montelukast, ondansetron, oxyCODONE-acetaminophen, promethazine, rOPINIRole, sertraline, simvastatin, and valsartan  History Review  Allergy: Sandra [name] is allergic to trazodone, codeine, cortisone, and zolpidem. Drug: Sandra [name]  reports no history of drug use. Alcohol:  reports no history of alcohol use. Tobacco:  reports that she quit smoking about 49 years ago. Her smoking use included cigarettes. She has never used smokeless tobacco. Social: Sandra [name]  reports that she quit smoking about 49 years ago. Her smoking use  included cigarettes. She has never used smokeless tobacco. She reports that she does not drink alcohol and does not use drugs. Medical:  has a past medical history of Anal fissure, Anemia, Anxiety (08/01/2014), Arthritis, degenerative (07/30/2013), [name] syndrome, Claustrophobia, Clinical depression (06/30/2014), Daily nausea, Depression, Diffuse myofascial pain syndrome, Dyspareunia, female, Elevated liver enzymes, Elevated liver enzymes, GERD (gastroesophageal reflux disease), H/O breast biopsy (01/05/2015), H/O: attempted suicide (2013), Hepatitis B, Hereditary and idiopathic peripheral neuropathy, Hiatal hernia, Hiatal hernia, History of  hysterectomy (01/05/2015), History of peptic ulcer disease (01/05/2015), Hypercholesteremia, Hypertension, Incontinence, Legally blind, Non-seasonal allergic rhinitis, Opiate use, Osteoarthritis of both knees, Palpitations, Panic attacks, Postmenopausal osteoporosis, PUD (peptic ulcer disease), Rectocele (01/05/2015), Rectocele, Restless leg syndrome, Retinitis pigmentosa, S/P cholecystectomy (01/05/2015), Senile nuclear sclerosis, and Visual hallucinations. Surgical: Sandra [name]  has a past surgical history that includes Cholecystectomy; Rectocele repair; Abdominal hysterectomy; Appendectomy; Colonoscopy; Esophagogastroduodenoscopy; Sphincterotomy (N/A, 01/24/2017); Hemorrhoid surgery (N/A, 03/07/2017); Hernia repair; Esophagogastroduodenoscopy (egd) with propofol (N/A, 09/17/2017); Oophorectomy; and Breast biopsy (Bilateral, 1977). Family: family history includes Bone cancer in her maternal uncle; Breast cancer in her sister; Breast cancer (age of onset: 77) in her mother; Cancer in her father; Dementia in her mother; Diabetes in her mother; Hypertension in her mother; Kidney cancer in her sister; Leukemia in her paternal grandfather and paternal grandmother; Pancreatic cancer in her maternal aunt; Prostate cancer (age of onset: 34) in her father.  Laboratory Chemistry Profile   Renal Lab Results  Component Value Date   BUN 15 02/22/2022   CREATININE 0.65 02/22/2022   GFRAA >60 01/16/2017   GFRNONAA >60 02/22/2022    Hepatic Lab Results  Component Value Date   AST 29 02/22/2022   ALT 28 02/22/2022   ALBUMIN 4.9 02/22/2022   ALKPHOS 60 02/22/2022   HCVAB NON REACTIVE 06/26/2021   LIPASE 38 02/22/2022   AMMONIA 31 09/28/2020    Electrolytes Lab Results  Component Value Date   NA 140 02/22/2022   K 3.5 02/22/2022   CL 104 02/22/2022   CALCIUM 9.4 02/22/2022   MG 2.0 09/27/2020    Bone Lab Results  Component Value Date   25OHVITD1 19 (L) 02/07/2015   25OHVITD2 3.9 02/07/2015    25OHVITD3 15 02/07/2015    Inflammation (CRP: Acute Phase) (ESR: Chronic Phase) Lab Results  Component Value Date   CRP 1.3 (H) 09/27/2020   ESRSEDRATE 26 02/07/2015   LATICACIDVEN 1.1 09/27/2020         Note: Above Lab results reviewed.  Recent Imaging Review  DG PAIN CLINIC C-ARM 1-60 MIN NO REPORT Fluoro was used, but no Radiologist interpretation will be provided.  Please refer to "NOTES" tab for provider progress note. Note: Reviewed        Physical Exam  General appearance: Well nourished, well developed, and well hydrated. In no apparent acute distress Mental status: Alert, oriented x 3 (person, place, & time)       Respiratory: No evidence of acute respiratory distress Eyes: PERLA Vitals: BP (!) 143/69   Pulse 69   Temp (!) 97.5 F (36.4 C)   Resp 16   Ht 5\' 1"  (1.549 m)   Wt 146 lb (66.2 kg)   SpO2 93%   BMI 27.59 kg/m  BMI: Estimated body mass index is 27.59 kg/m as calculated from the following:   Height as of this encounter: 5\' 1"  (1.549 m).   Weight as of this encounter: 146 lb (66.2 kg). Ideal: Ideal body weight: 47.8 kg (105 lb 6.1 oz)  Adjusted ideal body weight: 55.2 kg (121 lb 10 oz)  +LBP  Assessment   Diagnosis Status  1. Chronic pain syndrome   2. Lumbar facet syndrome (Bilateral) (R>L)   3. [name] Syndrome (CBS)   4. Sacroiliac joint pain    Controlled Controlled Controlled   Updated Problems: No problems updated.  Plan of Care    Sandra [name] has a current medication list which includes the following long-term medication(s): amitriptyline, esomeprazole, mirtazapine, montelukast, promethazine, quetiapine, ropinirole, simvastatin, valsartan, and gabapentin.  Pharmacotherapy (Medications Ordered): Meds ordered this encounter  Medications   oxyCODONE-acetaminophen (PERCOCET) 7.5-325 MG tablet    Sig: Take 1 tablet by mouth every 6 (six) hours as needed for moderate pain (pain score 4-6) or severe pain (pain score  7-10). Must last 30 days.    Dispense:  120 tablet    Refill:  0    Chronic Pain: STOP Act (Not applicable) Fill 1 day early if closed on refill date. Avoid benzodiazepines within 8 hours of opioids   oxyCODONE-acetaminophen (PERCOCET) 7.5-325 MG tablet    Sig: Take 1 tablet by mouth every 6 (six) hours as needed for moderate pain (pain score 4-6) or severe pain (pain score 7-10). Must last 30 days.    Dispense:  120 tablet    Refill:  0    Chronic Pain: STOP Act (Not applicable) Fill 1 day early if closed on refill date. Avoid benzodiazepines within 8 hours of opioids   oxyCODONE-acetaminophen (PERCOCET) 7.5-325 MG tablet    Sig: Take 1 tablet by mouth every 6 (six) hours as needed for moderate pain (pain score 4-6) or severe pain (pain score 7-10). Must last 30 days.    Dispense:  120 tablet    Refill:  0    Chronic Pain: STOP Act (Not applicable) Fill 1 day early if closed on refill date. Avoid benzodiazepines within 8 hours of opioids   gabapentin (NEURONTIN) 600 MG tablet    Sig: Take 1 tablet (600 mg total) by mouth every 8 (eight) hours.    Dispense:  90 tablet    Refill:  5    Fill one day early if pharmacy is closed on scheduled refill date. May substitute for generic if available.   methocarbamol (ROBAXIN) 500 MG tablet    Sig: Take 1 tablet (500 mg total) by mouth every 8 (eight) hours as needed for muscle spasms.    Dispense:  90 tablet    Refill:  5    Do not place this medication, or any other prescription from our practice, on "Automatic Refill". Patient may have prescription filled one day early if pharmacy is closed on scheduled refill date.   Orders:  No orders of the defined types were placed in this encounter.  Follow-up plan:   Return in about 13 weeks (around 05/08/2023) for MM, F2F.      Status post bilateral intra-articular knee steroid, right elbow injection on 04/05/2019.  Status post L4-L5 ESI, right-sided along with right shoulder steroid injection on  06/16/2019, right shoulder injection on 07/28/2019, posterior approach; B/L L4 and L5 TF ESI.  B/L L3,4,5 MBNB #1 10/01/22            Recent Visits Date Type Provider Dept  11/14/22 Office Visit Edward Jolly, MD [facility]  Showing recent visits within past 90 days and meeting all other requirements Today's Visits Date Type Provider Dept  02/06/23 Office Visit Edward Jolly, MD [facility]  Showing today's visits  and meeting all other requirements Future Appointments No visits were found meeting these conditions. Showing future appointments within next 90 days and meeting all other requirements  I discussed the assessment and treatment plan with the patient. The patient was provided an opportunity to ask questions and all were answered. The patient agreed with the plan and demonstrated an understanding of the instructions.  Patient advised to call back or seek an in-person evaluation if the symptoms or condition worsens.  Duration of encounter: 30 minutes.  Total time on encounter, as per AMA guidelines included both the face-to-face and non-face-to-face time personally spent by the physician and/or other qualified health care professional(s) on the day of the encounter (includes time in activities that require the physician or other qualified health care professional and does not include time in activities normally performed by clinical staff). Physician's time may include the following activities when performed: Preparing to see the patient (e.g., pre-charting review of records, searching for previously ordered imaging, lab work, and nerve conduction tests) Review of prior analgesic pharmacotherapies. Reviewing PMP Interpreting ordered tests (e.g., lab work, imaging, nerve conduction tests) Performing post-procedure evaluations, including interpretation of diagnostic procedures Obtaining and/or reviewing separately obtained history Performing a medically appropriate  examination and/or evaluation Counseling and educating the patient/family/caregiver Ordering medications, tests, or procedures Referring and communicating with other health care professionals (when not separately reported) Documenting clinical information in the electronic or other health record Independently interpreting results (not separately reported) and communicating results to the patient/ family/caregiver Care coordination (not separately reported)  Note by: Edward Jolly, MD Date: 02/06/2023; Time: 2:57 PM

## 2023-02-10 ENCOUNTER — Telehealth: Payer: Self-pay | Admitting: Student in an Organized Health Care Education/Training Program

## 2023-02-10 NOTE — Telephone Encounter (Signed)
Please call patient about medications. She wants to fill early. Not sure what is going on

## 2023-02-11 NOTE — Telephone Encounter (Signed)
Called patient and informed her that medication was due to be picked up today. Patient states someone is on the way to get it.

## 2023-02-26 ENCOUNTER — Telehealth: Payer: Self-pay | Admitting: Pain Medicine

## 2023-02-26 NOTE — Telephone Encounter (Signed)
 [facility] in [place] is asking if they can fill a prescription cough syrup Hydrocone is in it. Please call them asap.   [employer]

## 2023-02-26 NOTE — Telephone Encounter (Signed)
 [facility] notified.

## 2023-03-06 ENCOUNTER — Telehealth: Payer: Self-pay | Admitting: Student in an Organized Health Care Education/Training Program

## 2023-03-06 NOTE — Telephone Encounter (Signed)
Attempted to contact [facility] and notify them ok to fill cough syrup containing Hydrocodone. After being placed on hold for 10 minutes, I did not speak with anyone who could receive authorization.   They were already contacted by our office on 02-26-23 and given clearance to fill.

## 2023-03-06 NOTE — Telephone Encounter (Signed)
Pharmacy tech called wanted to know if they can filled another prescription that was send in by provider Laural Benes. Pharmacy tech stated that they had send over a clearance request so they could filled prescription never got a respond back. She stated that patient has called again. Please give pharmacy a call. TY

## 2023-03-21 ENCOUNTER — Ambulatory Visit: Payer: Self-pay

## 2023-03-21 NOTE — Telephone Encounter (Signed)
Summary: Vomiting, diarrhea   Pt calling in with following symptoms, cough, vomiting, diarrhea, scratchy throat and itchy eyes. Symptoms started beginning of this week.  Pt seeking clinical advice       Chief Complaint: Dry cough, runny nose. Vomited "from the cough and mucus in my nose."  Symptoms: Above Frequency: This week Pertinent Negatives: Patient denies fever Disposition: [] ED /[] Urgent Care (no appt availability in office) / [] Appointment(In office/virtual)/ []  [place] [facility]/ [x] Home Care/ [] Refused Recommended Disposition /[] [place] [facility]/ [x]  Follow-up with PCP Additional Notes: Will try Coricidin OTC.Will call PCP today.  Reason for Disposition  Cough with cold symptoms (e.g., runny nose, postnasal drip, throat clearing)  Answer Assessment - Initial Assessment Questions 1. ONSET: "When did the cough begin?"      This week 2. SEVERITY: "How bad is the cough today?"      Severe 3. SPUTUM: "Describe the color of your sputum" (none, dry cough; clear, white, yellow, green)     None 4. HEMOPTYSIS: "Are you coughing up any blood?" If so ask: "How much?" (flecks, streaks, tablespoons, etc.)     No 5. DIFFICULTY BREATHING: "Are you having difficulty breathing?" If Yes, ask: "How bad is it?" (e.g., mild, moderate, severe)    - MILD: No SOB at rest, mild SOB with walking, speaks normally in sentences, can lie down, no retractions, pulse < 100.    - MODERATE: SOB at rest, SOB with minimal exertion and prefers to sit, cannot lie down flat, speaks in phrases, mild retractions, audible wheezing, pulse 100-120.    - SEVERE: Very SOB at rest, speaks in single words, struggling to breathe, sitting hunched forward, retractions, pulse > 120      No 6. FEVER: "Do you have a fever?" If Yes, ask: "What is your temperature, how was it measured, and when did it start?"     No 7. CARDIAC HISTORY: "Do you have any history of heart disease?" (e.g., heart attack, congestive heart  failure)      No 8. LUNG HISTORY: "Do you have any history of lung disease?"  (e.g., pulmonary embolus, asthma, emphysema)     No 9. PE RISK FACTORS: "Do you have a history of blood clots?" (or: recent major surgery, recent prolonged travel, bedridden)     No 10. OTHER SYMPTOMS: "Do you have any other symptoms?" (e.g., runny nose, wheezing, chest pain)       Sore throat 11. PREGNANCY: "Is there any chance you are pregnant?" "When was your last menstrual period?"       No 12. TRAVEL: "Have you traveled out of the country in the last month?" (e.g., travel history, exposures)       No  Protocols used: Cough - Acute Non-Productive-A-AH

## 2023-04-10 ENCOUNTER — Telehealth: Payer: Self-pay | Admitting: Student in an Organized Health Care Education/Training Program

## 2023-04-10 NOTE — Telephone Encounter (Signed)
Informed patient that she may not fill prescription one day earlier due to weather, as the weather should only improve .

## 2023-04-10 NOTE — Telephone Encounter (Signed)
Patient is asking if she can pick her meds up on Friday instead of Saturday due to the weather.please advise patient

## 2023-04-16 ENCOUNTER — Ambulatory Visit: Admission: RE | Admit: 2023-04-16 | Payer: Medicare PPO | Source: Home / Self Care | Admitting: Surgery

## 2023-04-16 ENCOUNTER — Encounter: Admission: RE | Payer: Self-pay | Source: Home / Self Care

## 2023-04-16 SURGERY — COLONOSCOPY WITH PROPOFOL
Anesthesia: General | Site: Rectum

## 2023-04-29 ENCOUNTER — Telehealth: Payer: Self-pay | Admitting: Student in an Organized Health Care Education/Training Program

## 2023-05-06 ENCOUNTER — Encounter: Payer: Self-pay | Admitting: Student in an Organized Health Care Education/Training Program

## 2023-05-06 ENCOUNTER — Ambulatory Visit
Payer: Medicare PPO | Attending: Student in an Organized Health Care Education/Training Program | Admitting: Student in an Organized Health Care Education/Training Program

## 2023-05-06 ENCOUNTER — Telehealth: Payer: Self-pay | Admitting: Student in an Organized Health Care Education/Training Program

## 2023-05-06 VITALS — BP 153/76 | HR 89 | Temp 98.1°F | Ht 61.0 in | Wt 146.0 lb

## 2023-05-06 DIAGNOSIS — M461 Sacroiliitis, not elsewhere classified: Secondary | ICD-10-CM | POA: Insufficient documentation

## 2023-05-06 DIAGNOSIS — M47816 Spondylosis without myelopathy or radiculopathy, lumbar region: Secondary | ICD-10-CM | POA: Insufficient documentation

## 2023-05-06 DIAGNOSIS — G894 Chronic pain syndrome: Secondary | ICD-10-CM | POA: Insufficient documentation

## 2023-05-06 DIAGNOSIS — M533 Sacrococcygeal disorders, not elsewhere classified: Secondary | ICD-10-CM | POA: Diagnosis not present

## 2023-05-06 DIAGNOSIS — H5316 Psychophysical visual disturbances: Secondary | ICD-10-CM | POA: Insufficient documentation

## 2023-05-06 MED ORDER — OXYCODONE-ACETAMINOPHEN 7.5-325 MG PO TABS
1.0000 | ORAL_TABLET | Freq: Four times a day (QID) | ORAL | 0 refills | Status: DC | PRN
Start: 2023-07-11 — End: 2023-07-29

## 2023-05-06 MED ORDER — OXYCODONE-ACETAMINOPHEN 7.5-325 MG PO TABS
1.0000 | ORAL_TABLET | Freq: Four times a day (QID) | ORAL | 0 refills | Status: AC | PRN
Start: 2023-05-12 — End: 2023-06-11

## 2023-05-06 MED ORDER — OXYCODONE-ACETAMINOPHEN 7.5-325 MG PO TABS
1.0000 | ORAL_TABLET | Freq: Four times a day (QID) | ORAL | 0 refills | Status: AC | PRN
Start: 2023-06-11 — End: 2023-07-11

## 2023-05-06 NOTE — Progress Notes (Signed)
 Nursing Pain Medication Assessment:  Safety precautions to be maintained throughout the outpatient stay will include: orient to surroundings, keep bed in low position, maintain call bell within reach at all times, provide assistance with transfer out of bed and ambulation.  Medication Inspection Compliance: Sandra [name] did not comply with our request to bring her pills to be counted. She was reminded that bringing the medication bottles, even when empty, is a requirement.  Medication: None brought in. Pill/Patch Count: None available to be counted. Bottle Appearance: No container available. Did not bring bottle(s) to appointment. Filled Date: N/A Last Medication intake:  TodaySafety precautions to be maintained throughout the outpatient stay will include: orient to surroundings, keep bed in low position, maintain call bell within reach at all times, provide assistance with transfer out of bed and ambulation. Pt forgot to bring in her pill bottle.

## 2023-05-06 NOTE — Patient Instructions (Signed)

## 2023-05-06 NOTE — Progress Notes (Signed)
 PROVIDER NOTE: Information contained herein reflects review and annotations entered in association with encounter. Interpretation of such information and data should be left to medically-trained personnel. Information provided to patient can be located elsewhere in the medical record under "Patient Instructions". Document created using STT-dictation technology, any transcriptional errors that may result from process are unintentional.    Patient: Sandra [name]  Service Category: E/M  Provider: Edward Jolly, MD  DOB: 09/12/1955  DOS: 05/06/2023  Referring Provider: Gracelyn Nurse, MD  MRN: [id number]  Specialty: Interventional Pain Management  PCP: Gracelyn Nurse, MD  Type: Established Patient  Setting: Ambulatory outpatient    Location: Office  Delivery: Face-to-face     HPI  Ms. Kyria Bumgardner, a 68 y.o. year old female, is here today because of her Chronic pain syndrome [G89.4]. Ms. Seth's primary complain today is Back Pain (lower)  Pertinent problems: Ms. Credeur has Long term current use of opiate analgesic; Long term prescription opiate use; Opiate use; Hallucinations, visual; Psychiatric disorder; Chronic low back pain (Location of Primary Source of Pain) (Bilateral) (R>L); Chronic bilateral knee pain; Opiate dependence (HCC); Lumbar facet arthropathy; Lumbar spondylosis; Lumbar facet syndrome (Bilateral) (R>L); Diffuse myofascial pain syndrome; [name] Syndrome (CBS); Arthropathy of right elbow; and Localized osteoarthritis of right shoulder on their pertinent problem list. Pain Assessment: Severity of Chronic pain is reported as a 8 /10. Location: Back Right, Left/pain radiaties down both leg. Onset: More than a month ago. Quality: Aching, Burning, Constant, Throbbing, Stabbing, Shooting, Sharp. Timing: Constant. Modifying factor(s): Meds and laying down. Vitals:  height is 5\' 1"  (1.549 m) and weight is 146 lb (66.2 kg). Her temperature is 98.1 F (36.7 C). Her blood pressure  is 153/76 (abnormal) and her pulse is 89. Her oxygen saturation is 97%.  BMI: Estimated body mass index is 27.59 kg/m as calculated from the following:   Height as of this encounter: 5\' 1"  (1.549 m).   Weight as of this encounter: 146 lb (66.2 kg). Last encounter: 11/14/2022. Last procedure: 10/01/2022.  Reason for encounter: medication management.  No change in medical history since last visit other than congestion, cough, and likely URI.  Patient continues multimodal pain regimen as prescribed. She forgot to bring her pills in today.  States that current regimen provides pain relief and improvement in functional status.    Pharmacotherapy Assessment  Analgesic: Percocet  7.5 mg every 6 hours as needed, quantity 120/month; MME equals 45.    Monitoring: [place] PMP: PDMP reviewed during this encounter.       Pharmacotherapy: No side-effects or adverse reactions reported. Compliance: No problems identified. Effectiveness: Clinically acceptable.  Brigitte Pulse, RN  05/06/2023  1:58 PM  Sign when Signing Visit Nursing Pain Medication Assessment:  Safety precautions to be maintained throughout the outpatient stay will include: orient to surroundings, keep bed in low position, maintain call bell within reach at all times, provide assistance with transfer out of bed and ambulation.  Medication Inspection Compliance: Ms. Crass did not comply with our request to bring her pills to be counted. She was reminded that bringing the medication bottles, even when empty, is a requirement.  Medication: None brought in. Pill/Patch Count: None available to be counted. Bottle Appearance: No container available. Did not bring bottle(s) to appointment. Filled Date: N/A Last Medication intake:  TodaySafety precautions to be maintained throughout the outpatient stay will include: orient to surroundings, keep bed in low position, maintain call bell within reach at all times, provide assistance with transfer  out  of bed and ambulation. Pt forgot to bring in her pill bottle.  No results found for: "CBDTHCR" No results found for: "D8THCCBX" No results found for: "D9THCCBX"  UDS:  Summary  Date Value Ref Range Status  03/07/2022 Note  Final    Comment:    ==================================================================== [facility] (MW) ==================================================================== Test                             Result       Flag       Units  Drug Present and Declared for Prescription Verification   Hydrocodone                    3984         EXPECTED   ng/mg creat   Hydromorphone                  412          EXPECTED   ng/mg creat   Dihydrocodeine                 435          EXPECTED   ng/mg creat   Norhydrocodone                 >3030        EXPECTED   ng/mg creat    Sources of hydrocodone include scheduled prescription medications.    Hydromorphone, dihydrocodeine and norhydrocodone are expected    metabolites of hydrocodone. Hydromorphone and dihydrocodeine are    also available as scheduled prescription medications.  ==================================================================== Test                      Result    Flag   Units      Ref Range   Creatinine              165              mg/dL      >=98 ==================================================================== Declared Medications:  The flagging and interpretation on this report are based on the  following declared medications.  Unexpected results may arise from  inaccuracies in the declared medications.   **Note: The testing scope of this panel includes these medications:   Hydrocodone (Norco)   **Note: The testing scope of this panel does not include the  following reported medications:   Acetaminophen (Tylenol)  Acetaminophen (Norco)  Albuterol (Ventolin HFA)  Amitriptyline (Elavil)  Amlodipine (Norvasc)  Aspirin  Darbepoetin Alfa  Esomeprazole (Nexium)  Fluticasone  (Flonase)  Gabapentin (Neurontin)  Melatonin  Methocarbamol (Robaxin)  Montelukast (Singulair)  Promethazine (Phenergan)  Quetiapine (Seroquel)  Simvastatin (Zocor)  Valsartan (Diovan) ==================================================================== For clinical consultation, please call [phone]. ====================================================================       ROS  Constitutional: Denies any fever or chills Gastrointestinal: No reported hemesis, hematochezia, vomiting, or acute GI distress Musculoskeletal:  +LBP Neurological: No reported episodes of acute onset apraxia, aphasia, dysarthria, agnosia, amnesia, paralysis, loss of coordination, or loss of consciousness  Medication Review  Melatonin, QUEtiapine, acetaminophen, amLODipine, amitriptyline, aspirin, esomeprazole, fluticasone, gabapentin, hydrochlorothiazide, meloxicam, methocarbamol, mirtazapine, montelukast, ondansetron, oxyCODONE-acetaminophen, promethazine, rOPINIRole, sertraline, simvastatin, and valsartan  History Review  Allergy: Ms. Wessman is allergic to trazodone, codeine, cortisone, and zolpidem. Drug: Ms. Donica  reports no history of drug use. Alcohol:  reports no history of alcohol use. Tobacco:  reports that she quit smoking about  49 years ago. Her smoking use included cigarettes. She has never used smokeless tobacco. Social: Ms. Waskey  reports that she quit smoking about 49 years ago. Her smoking use included cigarettes. She has never used smokeless tobacco. She reports that she does not drink alcohol and does not use drugs. Medical:  has a past medical history of Anal fissure, Anemia, Anxiety (08/01/2014), Arthritis, degenerative (07/30/2013), [name] syndrome, Claustrophobia, Clinical depression (06/30/2014), Daily nausea, Depression, Diffuse myofascial pain syndrome, Dyspareunia, female, Elevated liver enzymes, Elevated liver enzymes, GERD (gastroesophageal reflux disease),  H/O breast biopsy (01/05/2015), H/O: attempted suicide (2013), Hepatitis B, Hereditary and idiopathic peripheral neuropathy, Hiatal hernia, Hiatal hernia, History of hysterectomy (01/05/2015), History of peptic ulcer disease (01/05/2015), Hypercholesteremia, Hypertension, Incontinence, Legally blind, Non-seasonal allergic rhinitis, Opiate use, Osteoarthritis of both knees, Palpitations, Panic attacks, Postmenopausal osteoporosis, PUD (peptic ulcer disease), Rectocele (01/05/2015), Rectocele, Restless leg syndrome, Retinitis pigmentosa, S/P cholecystectomy (01/05/2015), Senile nuclear sclerosis, and Visual hallucinations. Surgical: Ms. Schoenfelder  has a past surgical history that includes Cholecystectomy; Rectocele repair; Abdominal hysterectomy; Appendectomy; Colonoscopy; Esophagogastroduodenoscopy; Sphincterotomy (N/A, 01/24/2017); Hemorrhoid surgery (N/A, 03/07/2017); Hernia repair; Esophagogastroduodenoscopy (egd) with propofol (N/A, 09/17/2017); Oophorectomy; and Breast biopsy (Bilateral, 1977). Family: family history includes Bone cancer in her maternal uncle; Breast cancer in her sister; Breast cancer (age of onset: 76) in her mother; Cancer in her father; Dementia in her mother; Diabetes in her mother; Hypertension in her mother; Kidney cancer in her sister; Leukemia in her paternal grandfather and paternal grandmother; Pancreatic cancer in her maternal aunt; Prostate cancer (age of onset: 57) in her father.  Laboratory Chemistry Profile   Renal Lab Results  Component Value Date   BUN 15 02/22/2022   CREATININE 0.65 02/22/2022   GFRAA >60 01/16/2017   GFRNONAA >60 02/22/2022    Hepatic Lab Results  Component Value Date   AST 29 02/22/2022   ALT 28 02/22/2022   ALBUMIN 4.9 02/22/2022   ALKPHOS 60 02/22/2022   HCVAB NON REACTIVE 06/26/2021   LIPASE 38 02/22/2022   AMMONIA 31 09/28/2020    Electrolytes Lab Results  Component Value Date   NA 140 02/22/2022   K 3.5 02/22/2022   CL 104  02/22/2022   CALCIUM 9.4 02/22/2022   MG 2.0 09/27/2020    Bone Lab Results  Component Value Date   25OHVITD1 19 (L) 02/07/2015   25OHVITD2 3.9 02/07/2015   25OHVITD3 15 02/07/2015    Inflammation (CRP: Acute Phase) (ESR: Chronic Phase) Lab Results  Component Value Date   CRP 1.3 (H) 09/27/2020   ESRSEDRATE 26 02/07/2015   LATICACIDVEN 1.1 09/27/2020         Note: Above Lab results reviewed.  Recent Imaging Review  DG PAIN CLINIC C-ARM 1-60 MIN NO REPORT Fluoro was used, but no Radiologist interpretation will be provided.  Please refer to "NOTES" tab for provider progress note. Note: Reviewed        Physical Exam  General appearance: Well nourished, well developed, and well hydrated. In no apparent acute distress Mental status: Alert, oriented x 3 (person, place, & time)       Respiratory: No evidence of acute respiratory distress Eyes: PERLA Vitals: BP (!) 153/76   Pulse 89   Temp 98.1 F (36.7 C)   Ht 5\' 1"  (1.549 m)   Wt 146 lb (66.2 kg)   SpO2 97%   BMI 27.59 kg/m  BMI: Estimated body mass index is 27.59 kg/m as calculated from the following:   Height as of this encounter:  5\' 1"  (1.549 m).   Weight as of this encounter: 146 lb (66.2 kg). Ideal: Ideal body weight: 47.8 kg (105 lb 6.1 oz) Adjusted ideal body weight: 55.2 kg (121 lb 10 oz)  +LBP  Assessment   Diagnosis Status  1. Chronic pain syndrome   2. Lumbar facet syndrome (Bilateral) (R>L)   3. [name] Syndrome (CBS)   4. Sacroiliac joint pain   5. Lumbar facet arthropathy   6. SI joint arthritis (HCC)    Controlled Controlled Controlled   Updated Problems: No problems updated.  Plan of Care    Ms. Khadeejah Castner has a current medication list which includes the following long-term medication(s): amitriptyline, gabapentin, mirtazapine, montelukast, promethazine, quetiapine, ropinirole, simvastatin, valsartan, and esomeprazole.  Pharmacotherapy (Medications Ordered): Meds ordered  this encounter  Medications   oxyCODONE-acetaminophen (PERCOCET) 7.5-325 MG tablet    Sig: Take 1 tablet by mouth every 6 (six) hours as needed for moderate pain (pain score 4-6) or severe pain (pain score 7-10). Must last 30 days.    Dispense:  120 tablet    Refill:  0    Chronic Pain: STOP Act (Not applicable) Fill 1 day early if closed on refill date. Avoid benzodiazepines within 8 hours of opioids   oxyCODONE-acetaminophen (PERCOCET) 7.5-325 MG tablet    Sig: Take 1 tablet by mouth every 6 (six) hours as needed for moderate pain (pain score 4-6) or severe pain (pain score 7-10). Must last 30 days.    Dispense:  120 tablet    Refill:  0    Chronic Pain: STOP Act (Not applicable) Fill 1 day early if closed on refill date. Avoid benzodiazepines within 8 hours of opioids   oxyCODONE-acetaminophen (PERCOCET) 7.5-325 MG tablet    Sig: Take 1 tablet by mouth every 6 (six) hours as needed for moderate pain (pain score 4-6) or severe pain (pain score 7-10). Must last 30 days.    Dispense:  120 tablet    Refill:  0    Chronic Pain: STOP Act (Not applicable) Fill 1 day early if closed on refill date. Avoid benzodiazepines within 8 hours of opioids   Orders:  No orders of the defined types were placed in this encounter.  Follow-up plan:   Return in about 3 months (around 08/06/2023) for MM, F2F, add to NP list.      Status post bilateral intra-articular knee steroid, right elbow injection on 04/05/2019.  Status post L4-L5 ESI, right-sided along with right shoulder steroid injection on 06/16/2019, right shoulder injection on 07/28/2019, posterior approach; B/L L4 and L5 TF ESI.  B/L L3,4,5 MBNB #1 10/01/22            Recent Visits Date Type Provider Dept  02/06/23 Office Visit Edward Jolly, MD [facility]  Showing recent visits within past 90 days and meeting all other requirements Today's Visits Date Type Provider Dept  05/06/23 Office Visit Edward Jolly, MD [facility]   Showing today's visits and meeting all other requirements Future Appointments Date Type Provider Dept  07/31/23 Appointment Edward Jolly, MD [facility]  Showing future appointments within next 90 days and meeting all other requirements  I discussed the assessment and treatment plan with the patient. The patient was provided an opportunity to ask questions and all were answered. The patient agreed with the plan and demonstrated an understanding of the instructions.  Patient advised to call back or seek an in-person evaluation if the symptoms or condition worsens.  Duration of  encounter: 30 minutes.  Total time on encounter, as per AMA guidelines included both the face-to-face and non-face-to-face time personally spent by the physician and/or other qualified health care professional(s) on the day of the encounter (includes time in activities that require the physician or other qualified health care professional and does not include time in activities normally performed by clinical staff). Physician's time may include the following activities when performed: Preparing to see the patient (e.g., pre-charting review of records, searching for previously ordered imaging, lab work, and nerve conduction tests) Review of prior analgesic pharmacotherapies. Reviewing PMP Interpreting ordered tests (e.g., lab work, imaging, nerve conduction tests) Performing post-procedure evaluations, including interpretation of diagnostic procedures Obtaining and/or reviewing separately obtained history Performing a medically appropriate examination and/or evaluation Counseling and educating the patient/family/caregiver Ordering medications, tests, or procedures Referring and communicating with other health care professionals (when not separately reported) Documenting clinical information in the electronic or other health record Independently interpreting results (not separately reported) and communicating  results to the patient/ family/caregiver Care coordination (not separately reported)  Note by: Edward Jolly, MD Date: 05/06/2023; Time: 2:34 PM

## 2023-05-06 NOTE — Telephone Encounter (Signed)
 Patient states she thinks she is coming down with the flu and does not feel well at call, coughing and congestion. Wants to know if she can do a VV today and come in after she feels better

## 2023-06-05 ENCOUNTER — Telehealth: Payer: Self-pay | Admitting: Student in an Organized Health Care Education/Training Program

## 2023-06-05 NOTE — Telephone Encounter (Signed)
 Patient states she only has enough meds to last until Monday. Says she has not been taking extra. Wants to speak with nurse about this.

## 2023-06-05 NOTE — Telephone Encounter (Signed)
 Called and spoke with pharmacist regarding filled date of medication. Per pharmacist pick up date for her recent prescription was 05/12/23 with quantity of 120.   Called patient she she states her bottle says filled date 05/09/23. She states she has 17 pills left and that will last her until 06/09/23. She states she does not remember if she picked up prescription on 05/09/23 or 05/12/23. Informed her to call her pharmacy on 06/09/23 to see if she can fill medication on that day.   Patient verbalized understanding.

## 2023-07-09 ENCOUNTER — Other Ambulatory Visit: Payer: Self-pay

## 2023-07-09 ENCOUNTER — Ambulatory Visit
Admission: RE | Admit: 2023-07-09 | Discharge: 2023-07-09 | Disposition: A | Discharge status: Home / Self Care | Attending: Surgery | Admitting: Surgery

## 2023-07-09 ENCOUNTER — Encounter: Payer: Self-pay | Admitting: Surgery

## 2023-07-09 ENCOUNTER — Encounter
Admission: RE | Disposition: A | Discharge status: Home / Self Care | Payer: Self-pay | Source: Home / Self Care | Attending: Surgery

## 2023-07-09 ENCOUNTER — Ambulatory Visit: Admitting: Anesthesiology

## 2023-07-09 DIAGNOSIS — K644 Residual hemorrhoidal skin tags: Secondary | ICD-10-CM | POA: Diagnosis not present

## 2023-07-09 DIAGNOSIS — Z1211 Encounter for screening for malignant neoplasm of colon: Secondary | ICD-10-CM | POA: Diagnosis present

## 2023-07-09 DIAGNOSIS — Z87891 Personal history of nicotine dependence: Secondary | ICD-10-CM | POA: Diagnosis not present

## 2023-07-09 DIAGNOSIS — F418 Other specified anxiety disorders: Secondary | ICD-10-CM | POA: Diagnosis not present

## 2023-07-09 DIAGNOSIS — H3552 Pigmentary retinal dystrophy: Secondary | ICD-10-CM | POA: Insufficient documentation

## 2023-07-09 DIAGNOSIS — I10 Essential (primary) hypertension: Secondary | ICD-10-CM | POA: Diagnosis not present

## 2023-07-09 DIAGNOSIS — Z8249 Family history of ischemic heart disease and other diseases of the circulatory system: Secondary | ICD-10-CM | POA: Insufficient documentation

## 2023-07-09 DIAGNOSIS — Z8619 Personal history of other infectious and parasitic diseases: Secondary | ICD-10-CM | POA: Insufficient documentation

## 2023-07-09 DIAGNOSIS — H548 Legal blindness, as defined in USA: Secondary | ICD-10-CM | POA: Insufficient documentation

## 2023-07-09 DIAGNOSIS — M1990 Unspecified osteoarthritis, unspecified site: Secondary | ICD-10-CM | POA: Insufficient documentation

## 2023-07-09 DIAGNOSIS — K449 Diaphragmatic hernia without obstruction or gangrene: Secondary | ICD-10-CM | POA: Insufficient documentation

## 2023-07-09 DIAGNOSIS — G629 Polyneuropathy, unspecified: Secondary | ICD-10-CM | POA: Diagnosis not present

## 2023-07-09 DIAGNOSIS — Z8711 Personal history of peptic ulcer disease: Secondary | ICD-10-CM | POA: Diagnosis not present

## 2023-07-09 DIAGNOSIS — K219 Gastro-esophageal reflux disease without esophagitis: Secondary | ICD-10-CM | POA: Diagnosis not present

## 2023-07-09 HISTORY — PX: COLONOSCOPY: SHX5424

## 2023-07-09 SURGERY — COLONOSCOPY
Anesthesia: General | Site: Abdomen

## 2023-07-09 MED ORDER — SODIUM CHLORIDE 0.9 % IV SOLN: INTRAVENOUS | Status: DC

## 2023-07-09 MED ORDER — LIDOCAINE HCL (PF) 2 % IJ SOLN
INTRAMUSCULAR | Status: AC
  Filled 2023-07-09: qty 5

## 2023-07-09 MED ORDER — PROPOFOL 10 MG/ML IV BOLUS
INTRAVENOUS | Status: DC | PRN
  Administered 2023-07-09 (×3): 50 mg via INTRAVENOUS

## 2023-07-09 MED ORDER — PROPOFOL 500 MG/50ML IV EMUL
INTRAVENOUS | Status: DC | PRN
  Administered 2023-07-09: 100 ug/kg/min via INTRAVENOUS

## 2023-07-09 NOTE — H&P (Signed)
 Subjective:  CC: Rectal pain, chronic [K62.89, G89.29]   HPI: Sandra [name] is a 68 y.o. female who returns for above. Symptoms were first noted several years ago. Sharp pain, constant, with exacerbation with bowel movements. Pain exacerbation or any last a few minutes regardless of the type of bowel movement she has. She denies any bleeding from area. Initially treated for anal fissure and sphincterotomy back in 2019 and this was initially noted. Subsequently underwent hemorrhoidectomy as well due to the persistent pain. Per patient report, she had no resolution in the pain despite the procedures. Reexamine in 2023 and noted to have an anal fissure and was placed on diltiazem cream with no effect. Patient has since seen multiple physicians across various specialties including colorectal 1 several general surgeons, all with no specific modalities that have improved her pain. She has not received the colonoscopy which was the latest recommendation, stating she is fearful and anxious that this may worsen the pain.  She returns today for new onset pain in the area. Cannot explicitly explain the difference in pain but does feel it is different and is caused by hemorrhoids.   Past Medical History: has a past medical history of Abdominal hernia, Anemia, Blind, Cholelithiasis without obstruction, Chronic insomnia, Chronic pain of lower extremity, Depression, Fibrocystic breast disease, GERD (gastroesophageal reflux disease), Hepatitis, Hyperlipidemia, Hypertension, Legally blind, Retinal degenerative disease, and Vitamin D  deficiency.  Past Surgical History: has a past surgical history that includes Cholecystectomy; Breast biopsies x2 for fibrocystic disease; Colonoscopy (06/04/2005); Upper gastrointestinal endoscopy (11/04/2003, 06/04/2005); Appendectomy; egd (09/09/2013 [facility]); Repair rectocele (2007); Hysterectomy (2006); Sphincterotomy Anal (01/24/2017); hemorrhoidectomy internal & external (03/07/2017);  Hemorrhoidectomy Internal & External (03/07/2017); and egd (09/17/2017).  Family History: family history includes Breast cancer in her maternal uncle and sister; Dementia in her mother; High blood pressure (Hypertension) in her father and mother; Hyperlipidemia (Elevated cholesterol) in her mother and sister; Kidney cancer in her sister; Leukemia in her paternal grandfather and paternal grandmother; No Known Problems in her brother; Pancreatic cancer in her maternal aunt; Prostate cancer in her father; Seizures in her brother; Skin cancer in her mother.  Social History: reports that she quit smoking about 49 years ago. Her smoking use included cigarettes. She started smoking about 50 years ago. She has a 0.3 pack-year smoking history. She has never used smokeless tobacco. She reports that she does not drink alcohol and does not use drugs.  Current Medications: has a current medication list which includes the following prescription(s): acetaminophen , amlodipine , aspirin , esomeprazole, fluticasone  propionate, gabapentin , hydrochlorothiazide, hydrocodone -acetaminophen , hydrocodone -homatropine, ibuprofen, lidocaine , meclizine , melatonin, meloxicam, methocarbamol , mirtazapine, montelukast , nifedipine-lidocaine , ondansetron , oxycodone , promethazine , quetiapine, ropinirole, sertraline , simvastatin , and valsartan.  Allergies:  Allergies as of 04/07/2023 - Reviewed 04/07/2023  Allergen Reaction Noted  Trazodone Hives and Itching 05/08/2016  Acetaminophen -codeine Other (See Comments) 01/25/2013  Codeine Other (See Comments) 07/27/2013  Cortisone Unknown 07/11/2020  Zolpidem Other (See Comments) 05/08/2016   ROS:  A 15 point review of systems was performed and pertinent positives and negatives noted in HPI  Objective:    BP (!) 162/92  Pulse 85  Ht 152.4 cm (5')  Wt 66.2 kg (146 lb)  BMI 28.51 kg/m   Constitutional : No distress, cooperative, alert  Lymphatics/Throat: Supple with no  lymphadenopathy  Respiratory: Clear to auscultation bilaterally  Cardiovascular: Regular rate and rhythm  Gastrointestinal: Soft, non-tender, non-distended, no organomegaly.  Musculoskeletal: Steady gait and movement  Skin: Cool and moist  Psychiatric: Normal affect, non-agitated, not confused  Rectal: Chaperone present for  exam. External exam noted to have small skin tags, no ulceration, moderate TTP circumfrentially. Mucosa and perianal skin with no changes.. unable to proceed with DRE due to discomfort    LABS:  N/a   RADS: N/a  Assessment:    Rectal pain, chronic [K62.89, G89.29]  Small skin tags noted, somewhat increased rectal tone. Possibly has internal hemorrhoids but does not explain degree of pain she is having. Could potentially be anal fissure but she has received treatments in the past with no change.   Plan:   1. Rectal pain, chronic [K62.89, G89.29] still recommend colonoscopy since she still has not had one. PT eval for pelvic floor dysfunction for chronic pain, since she has never been referred for such labs/images/medications/previous chart entries reviewed personally and relevant changes/updates noted above.

## 2023-07-09 NOTE — Transfer of Care (Signed)
 Immediate Anesthesia Transfer of Care Note  Patient: Sandra [name]  Procedure(s) Performed: COLONOSCOPY (Abdomen)  Patient Location: PACU  Anesthesia Type:MAC  Level of Consciousness: drowsy  Airway & Oxygen Therapy: Patient Spontanous Breathing  Post-op Assessment: Report given to RN and Post -op Vital signs reviewed and stable  Post vital signs: Reviewed and stable  Last Vitals:  Vitals Value Taken Time  BP 112/64 07/09/23 0849  Temp 35.7 C 07/09/23 0847  Pulse 55 07/09/23 0852  Resp 12 07/09/23 0852  SpO2 99 % 07/09/23 0852  Vitals shown include unfiled device data.  Last Pain:  Vitals:   07/09/23 0847  TempSrc: Temporal  PainSc: Asleep         Complications: No notable events documented.

## 2023-07-09 NOTE — Anesthesia Postprocedure Evaluation (Signed)
 Anesthesia Post Note  Patient: Sandra [name]  Procedure(s) Performed: COLONOSCOPY (Abdomen)  Patient location during evaluation: PACU Anesthesia Type: General Level of consciousness: awake and alert, oriented and patient cooperative Pain management: pain level controlled Vital Signs Assessment: post-procedure vital signs reviewed and stable Respiratory status: spontaneous breathing, nonlabored ventilation and respiratory function stable Cardiovascular status: blood pressure returned to baseline and stable Postop Assessment: adequate PO intake Anesthetic complications: no   No notable events documented.   Last Vitals:  Vitals:   07/09/23 0857 07/09/23 0907  BP: 136/73   Pulse: 60   Resp: 15 14  Temp:    SpO2: 100%     Last Pain:  Vitals:   07/09/23 0857  TempSrc:   PainSc: 0-No pain                 Dorothey Gate

## 2023-07-09 NOTE — Op Note (Signed)
 [facility] Gastroenterology Patient Name: Sandra [name] Procedure Date: 07/09/2023 7:56 AM MRN: [id number] Account #: [id number] Date of Birth: 1955-04-20 Admit Type: Outpatient Age: 68 Room: [facility] ENDO ROOM 4 Gender: Female Note Status: Finalized Instrument Name: Charlyn Cooley [id number] Procedure:             Colonoscopy Indications:           Screening for colorectal malignant neoplasm Providers:             Conrado Delay MD, MD Referring MD:          Little Riff, MD (Referring MD) Medicines:             Propofol  per Anesthesia Complications:         No immediate complications. Procedure:             Pre-Anesthesia Assessment:                        - After reviewing the risks and benefits, the patient                         was deemed in satisfactory condition to undergo the                         procedure in an ambulatory setting.                        After obtaining informed consent, the colonoscope was                         passed under direct vision. Throughout the procedure,                         the patient's blood pressure, pulse, and oxygen                         saturations were monitored continuously. The                         Colonoscope was introduced through the anus and                         advanced to the the cecum, identified by the ileocecal                         valve. The colonoscopy was performed with difficulty                         due to poor bowel prep with stool present. Successful                         completion of the procedure was aided by lavage. Findings:      Skin tags were found on perianal exam.      The exam was otherwise without abnormality on direct and retroflexion       views. Impression:            - Perianal skin tags found on perianal exam.                        -  The examination was otherwise normal on direct and                         retroflexion views.                        - No specimens  collected. Recommendation:        - Discharge patient to home.                        - Resume previous diet.                        - Written discharge instructions were provided to the                         patient.                        - Repeat colonoscopy in 10 years for screening                         purposes. Procedure Code(s):     --- Professional ---                        [phone], Colonoscopy, flexible; diagnostic, including                         collection of specimen(s) by brushing or washing, when                         performed (separate procedure) Diagnosis Code(s):     --- Professional ---                        Z12.11, Encounter for screening for malignant neoplasm                         of colon                        K64.4, Residual hemorrhoidal skin tags CPT copyright 2022 American Medical Association. All rights reserved. The codes documented in this report are preliminary and upon coder review may  be revised to meet current compliance requirements. Dr. Ward Guy, MD Conrado Delay MD, MD 07/09/2023 8:47:29 AM This report has been signed electronically. Number of Addenda: 0 Note Initiated On: 07/09/2023 7:56 AM Scope Withdrawal Time: 0 hours 10 minutes 46 seconds  Total Procedure Duration: 0 hours 30 minutes 1 second  Estimated Blood Loss:  Estimated blood loss: none.      [facility]

## 2023-07-09 NOTE — Interval H&P Note (Signed)
 History and Physical Interval Note:  07/09/2023 7:50 AM  Sandra [name]  has presented today for surgery, with the diagnosis of Z12.11  Screening Colonoscopy.  The various methods of treatment have been discussed with the patient and family. After consideration of risks, benefits and other options for treatment, the patient has consented to  Procedure(s): COLONOSCOPY (N/A) as a surgical intervention.  The patient's history has been reviewed, patient examined, no change in status, stable for surgery.  I have reviewed the patient's chart and labs.  Questions were answered to the patient's satisfaction.     Neria Procter Rosea Conch

## 2023-07-09 NOTE — Anesthesia Preprocedure Evaluation (Addendum)
 Anesthesia Evaluation  Patient identified by MRN, date of birth, ID band Patient awake    Reviewed: Allergy & Precautions, NPO status , Patient's Chart, lab work & pertinent test results  History of Anesthesia Complications Negative for: history of anesthetic complications  Airway Mallampati: I   Neck ROM: Full    Dental  (+) Edentulous Upper, Edentulous Lower   Pulmonary former smoker (quit 1975)   Pulmonary exam normal breath sounds clear to auscultation       Cardiovascular hypertension, Normal cardiovascular exam Rhythm:Regular Rate:Normal     Neuro/Psych  PSYCHIATRIC DISORDERS (panic disorder) Anxiety Depression    Legally blind 2/2 retinitis pigmentosa; chronic pain  Neuromuscular disease (neuropathy)    GI/Hepatic hiatal hernia, PUD,GERD  ,,(+) Hepatitis -, B  Endo/Other  negative endocrine ROS    Renal/GU negative Renal ROS     Musculoskeletal  (+) Arthritis ,    Abdominal   Peds  Hematology  (+) Blood dyscrasia, anemia   Anesthesia Other Findings   Reproductive/Obstetrics                             Anesthesia Physical Anesthesia Plan  ASA: 3  Anesthesia Plan: General   Post-op Pain Management:    Induction: Intravenous  PONV Risk Score and Plan: 3 and Propofol  infusion, TIVA and Treatment may vary due to age or medical condition  Airway Management Planned: Natural Airway  Additional Equipment:   Intra-op Plan:   Post-operative Plan:   Informed Consent: I have reviewed the patients History and Physical, chart, labs and discussed the procedure including the risks, benefits and alternatives for the proposed anesthesia with the patient or authorized representative who has indicated his/her understanding and acceptance.       Plan Discussed with: CRNA  Anesthesia Plan Comments: (LMA/GETA backup discussed.  Patient consented for risks of anesthesia including but  not limited to:  - adverse reactions to medications - damage to eyes, teeth, lips or other oral mucosa - nerve damage due to positioning  - sore throat or hoarseness - damage to heart, brain, nerves, lungs, other parts of body or loss of life  Informed patient about role of CRNA in peri- and intra-operative care.  Patient voiced understanding.)        Anesthesia Quick Evaluation

## 2023-07-10 ENCOUNTER — Other Ambulatory Visit: Payer: Self-pay | Admitting: Internal Medicine

## 2023-07-10 ENCOUNTER — Encounter: Payer: Self-pay | Admitting: Surgery

## 2023-07-10 DIAGNOSIS — Z1231 Encounter for screening mammogram for malignant neoplasm of breast: Secondary | ICD-10-CM

## 2023-07-23 ENCOUNTER — Encounter

## 2023-07-30 ENCOUNTER — Ambulatory Visit: Attending: Nurse Practitioner | Admitting: Nurse Practitioner

## 2023-07-30 ENCOUNTER — Encounter: Payer: Self-pay | Admitting: Nurse Practitioner

## 2023-07-30 VITALS — BP 159/67 | HR 72 | Temp 97.6°F | Resp 16 | Ht 61.0 in | Wt 144.0 lb

## 2023-07-30 DIAGNOSIS — M4726 Other spondylosis with radiculopathy, lumbar region: Secondary | ICD-10-CM

## 2023-07-30 DIAGNOSIS — G894 Chronic pain syndrome: Secondary | ICD-10-CM | POA: Insufficient documentation

## 2023-07-30 DIAGNOSIS — H539 Unspecified visual disturbance: Secondary | ICD-10-CM | POA: Diagnosis present

## 2023-07-30 DIAGNOSIS — M47816 Spondylosis without myelopathy or radiculopathy, lumbar region: Secondary | ICD-10-CM | POA: Diagnosis present

## 2023-07-30 DIAGNOSIS — H5316 Psychophysical visual disturbances: Secondary | ICD-10-CM | POA: Insufficient documentation

## 2023-07-30 DIAGNOSIS — M5416 Radiculopathy, lumbar region: Secondary | ICD-10-CM | POA: Insufficient documentation

## 2023-07-30 DIAGNOSIS — Z79899 Other long term (current) drug therapy: Secondary | ICD-10-CM | POA: Insufficient documentation

## 2023-07-30 DIAGNOSIS — M533 Sacrococcygeal disorders, not elsewhere classified: Secondary | ICD-10-CM | POA: Diagnosis not present

## 2023-07-30 DIAGNOSIS — G8929 Other chronic pain: Secondary | ICD-10-CM | POA: Insufficient documentation

## 2023-07-30 MED ORDER — OXYCODONE-ACETAMINOPHEN 7.5-325 MG PO TABS: 1.0000 | ORAL_TABLET | Freq: Four times a day (QID) | ORAL | 0 refills | Status: DC | PRN

## 2023-07-30 MED ORDER — METHOCARBAMOL 500 MG PO TABS: 500.0000 mg | ORAL_TABLET | Freq: Three times a day (TID) | ORAL | 5 refills | Status: DC | PRN

## 2023-07-30 MED ORDER — GABAPENTIN 600 MG PO TABS: 600.0000 mg | ORAL_TABLET | Freq: Three times a day (TID) | ORAL | 5 refills | Status: AC

## 2023-07-30 MED ORDER — OXYCODONE-ACETAMINOPHEN 7.5-325 MG PO TABS: 1.0000 | ORAL_TABLET | Freq: Four times a day (QID) | ORAL | 0 refills | Status: AC | PRN

## 2023-07-30 NOTE — Progress Notes (Signed)
 Nursing Pain Medication Assessment:  Safety precautions to be maintained throughout the outpatient stay will include: orient to surroundings, keep bed in low position, maintain call bell within reach at all times, provide assistance with transfer out of bed and ambulation.  Medication Inspection Compliance: Pill count conducted under aseptic conditions, in front of the patient. Neither the pills nor the bottle was removed from the patient's sight at any time. Once count was completed pills were immediately returned to the patient in their original bottle.  Medication: Oxycodone /APAP Pill/Patch Count: 45 of 120 pills/patches remain Pill/Patch Appearance: Markings consistent with prescribed medication Bottle Appearance: Standard pharmacy container. Clearly labeled. Filled Date: 05 / 22 / 2025 Last Medication intake:  Today

## 2023-07-30 NOTE — Progress Notes (Signed)
 PROVIDER NOTE: Interpretation of information contained herein should be left to medically-trained personnel. Specific patient instructions are provided elsewhere under Patient Instructions section of medical record. This document was created in part using AI and STT-dictation technology, any transcriptional errors that may result from this process are unintentional.  Patient: Sandra [name]  Service: E/M   PCP: Little Riff, MD  DOB: 05-Jul-1955  DOS: 07/30/2023  Provider: Cherylin Corrigan, NP  MRN: [id number]  Delivery: Face-to-face  Specialty: Interventional Pain Management  Type: Established Patient  Setting: Ambulatory outpatient facility  Specialty designation: 09  Referring Prov.: Little Riff, MD  Location: Outpatient office facility       History of present illness (HPI) Ms. Dossie Ocanas, a 68 y.o. year old female, is here today because of her Chronic pain syndrome [G89.4]. Ms. Sleeth's primary complain today is Back Pain (Lumbar bilateral ) and Other (Rectal pain )  Pertinent problems: Ms. Grose has Long-term current use of opioid analgesics; Long-term prescription opioid use; Hallucinations; visual; Psychiatric disorder; Chronic low back pain (Bilateral) (R>L); Chronic bilateral knee pain; Opioid dependence (HCC); Lumbar facet arthropathy; Lumbar spondylolysis; and Lumbar facet syndrome (Bilateral) on their pertinent problem list.   Pain Assessment: Severity of Chronic pain is reported as a 8 /10. Location: Back (rectal pain) Lower, Right, Left/sometimes into the hips and legs. Onset: More than a month ago. Quality: Discomfort, Aching, Constant. Timing: Constant. Modifying factor(s): elevating feet, rest. Vitals:  height is 5' 1 (1.549 m) and weight is 144 lb (65.3 kg). Her temporal temperature is 97.6 F (36.4 C). Her blood pressure is 159/67 (abnormal) and her pulse is 72. Her respiration is 16 and oxygen saturation is 99%.  BMI: Estimated body mass index is 27.21 kg/m as  calculated from the following:   Height as of this encounter: 5' 1 (1.549 m).   Weight as of this encounter: 144 lb (65.3 kg).  Last encounter: 05/06/2023 Last procedure: 10/01/2022  Reason for encounter: medication management. No change in medical history since last visit.  Patient's pain is at baseline.  Patient continues multimodal pain regimen as prescribed.  States that it provides pain relief and improvement in functional status.  Pharmacotherapy Assessment   Analgesic: Oxycodone -acetaminophen  (Percocet) 7.5-325 mg every 6 hours as needed for pain. MME=45 Gabapentin  600 mg every 8 hours for neuropathic pain Methocarbamol  (Robaxin ) 500 mg every 8 hours as needed for muscle spasm Monitoring: [place] PMP: PDMP reviewed during this encounter.       Pharmacotherapy: No side-effects or adverse reactions reported. Compliance: No problems identified. Effectiveness: Clinically acceptable.  Malon Seamen, RN  07/30/2023 12:58 PM  Sign when Signing Visit Nursing Pain Medication Assessment:  Safety precautions to be maintained throughout the outpatient stay will include: orient to surroundings, keep bed in low position, maintain call bell within reach at all times, provide assistance with transfer out of bed and ambulation.  Medication Inspection Compliance: Pill count conducted under aseptic conditions, in front of the patient. Neither the pills nor the bottle was removed from the patient's sight at any time. Once count was completed pills were immediately returned to the patient in their original bottle.  Medication: Oxycodone /APAP Pill/Patch Count: 45 of 120 pills/patches remain Pill/Patch Appearance: Markings consistent with prescribed medication Bottle Appearance: Standard pharmacy container. Clearly labeled. Filled Date: 05 / 22 / 2025 Last Medication intake:  Today    UDS:  Summary  Date Value Ref Range Status  03/07/2022 Note  Final    Comment:     ====================================================================  ToxASSURE Select 13 (MW) ==================================================================== Test                             Result       Flag       Units  Drug Present and Declared for Prescription Verification   Hydrocodone                     3984         EXPECTED   ng/mg creat   Hydromorphone                  412          EXPECTED   ng/mg creat   Dihydrocodeine                 435          EXPECTED   ng/mg creat   Norhydrocodone                 >3030        EXPECTED   ng/mg creat    Sources of hydrocodone  include scheduled prescription medications.    Hydromorphone, dihydrocodeine and norhydrocodone are expected    metabolites of hydrocodone . Hydromorphone and dihydrocodeine are    also available as scheduled prescription medications.  ==================================================================== Test                      Result    Flag   Units      Ref Range   Creatinine              165              mg/dL      >=16 ==================================================================== Declared Medications:  The flagging and interpretation on this report are based on the  following declared medications.  Unexpected results may arise from  inaccuracies in the declared medications.   **Note: The testing scope of this panel includes these medications:   Hydrocodone  (Norco)   **Note: The testing scope of this panel does not include the  following reported medications:   Acetaminophen  (Tylenol )  Acetaminophen  (Norco)  Albuterol  (Ventolin  HFA)  Amitriptyline (Elavil)  Amlodipine  (Norvasc )  Aspirin   Darbepoetin Alfa  Esomeprazole (Nexium)  Fluticasone  (Flonase )  Gabapentin  (Neurontin )  Melatonin  Methocarbamol  (Robaxin )  Montelukast  (Singulair )  Promethazine  (Phenergan )  Quetiapine (Seroquel)  Simvastatin  (Zocor )  Valsartan  (Diovan) ==================================================================== For clinical consultation, please call [phone]. ====================================================================     No results found for: CBDTHCR No results found for: D8THCCBX No results found for: D9THCCBX  ROS  Constitutional: Denies any fever or chills Gastrointestinal: No reported hemesis, hematochezia, vomiting, or acute GI distress Musculoskeletal: Bilateral lumbar pain, Rectal pain Neurological: No reported episodes of acute onset apraxia, aphasia, dysarthria, agnosia, amnesia, paralysis, loss of coordination, or loss of consciousness  Medication Review  Melatonin, QUEtiapine, acetaminophen , amLODipine , amitriptyline, aspirin , esomeprazole, fluticasone , gabapentin , hydrochlorothiazide, meloxicam, methocarbamol , mirtazapine, montelukast , ondansetron , oxyCODONE -acetaminophen , promethazine , rOPINIRole, sertraline , simvastatin , and valsartan  History Review  Allergy: Ms. Jenson is allergic to trazodone, codeine, cortisone, and zolpidem. Drug: Ms. Attwood  reports no history of drug use. Alcohol:  reports no history of alcohol use. Tobacco:  reports that she quit smoking about 49 years ago. Her smoking use included cigarettes. She has never used smokeless tobacco. Social: Ms. Kapusta  reports that she quit smoking about 49 years ago. Her smoking use included cigarettes. She has never used  smokeless tobacco. She reports that she does not drink alcohol and does not use drugs. Medical:  has a past medical history of Anal fissure, Anemia, Anxiety (08/01/2014), Arthritis, degenerative (07/30/2013), Onnie Bilis syndrome, Claustrophobia, Clinical depression (06/30/2014), Daily nausea, Depression, Diffuse myofascial pain syndrome, Dyspareunia, female, Elevated liver enzymes, Elevated liver enzymes, GERD (gastroesophageal reflux disease), H/O breast biopsy (01/05/2015), H/O: attempted suicide  (2013), Hepatitis B, Hereditary and idiopathic peripheral neuropathy, Hiatal hernia, Hiatal hernia, History of hysterectomy (01/05/2015), History of peptic ulcer disease (01/05/2015), Hypercholesteremia, Hypertension, Incontinence, Legally blind, Non-seasonal allergic rhinitis, Opiate use, Osteoarthritis of both knees, Palpitations, Panic attacks, Postmenopausal osteoporosis, PUD (peptic ulcer disease), Rectocele (01/05/2015), Rectocele, Restless leg syndrome, Retinitis pigmentosa, S/P cholecystectomy (01/05/2015), Senile nuclear sclerosis, and Visual hallucinations. Surgical: Ms. Mcdowell  has a past surgical history that includes Cholecystectomy; Rectocele repair; Abdominal hysterectomy; Appendectomy; Colonoscopy; Esophagogastroduodenoscopy; Sphincterotomy (N/A, 01/24/2017); Hemorrhoid surgery (N/A, 03/07/2017); Hernia repair; Esophagogastroduodenoscopy (egd) with propofol  (N/A, 09/17/2017); Oophorectomy; Breast biopsy (Bilateral, 1977); and Colonoscopy (N/A, 07/09/2023). Family: family history includes Bone cancer in her maternal uncle; Breast cancer in her sister; Breast cancer (age of onset: 46) in her mother; Cancer in her father; Dementia in her mother; Diabetes in her mother; Hypertension in her mother; Kidney cancer in her sister; Leukemia in her paternal grandfather and paternal grandmother; Pancreatic cancer in her maternal aunt; Prostate cancer (age of onset: 69) in her father.  Laboratory Chemistry Profile   Renal Lab Results  Component Value Date   BUN 15 02/22/2022   CREATININE 0.65 02/22/2022   GFRAA >60 01/16/2017   GFRNONAA >60 02/22/2022    Hepatic Lab Results  Component Value Date   AST 29 02/22/2022   ALT 28 02/22/2022   ALBUMIN 4.9 02/22/2022   ALKPHOS 60 02/22/2022   HCVAB NON REACTIVE 06/26/2021   LIPASE 38 02/22/2022   AMMONIA 31 09/28/2020    Electrolytes Lab Results  Component Value Date   NA 140 02/22/2022   K 3.5 02/22/2022   CL 104 02/22/2022   CALCIUM 9.4  02/22/2022   MG 2.0 09/27/2020    Bone Lab Results  Component Value Date   25OHVITD1 19 (L) 02/07/2015   25OHVITD2 3.9 02/07/2015   25OHVITD3 15 02/07/2015    Inflammation (CRP: Acute Phase) (ESR: Chronic Phase) Lab Results  Component Value Date   CRP 1.3 (H) 09/27/2020   ESRSEDRATE 26 02/07/2015   LATICACIDVEN 1.1 09/27/2020         Note: Above Lab results reviewed.  Recent Imaging Review  DG PAIN CLINIC C-ARM 1-60 MIN NO REPORT Fluoro was used, but no Radiologist interpretation will be provided.  Please refer to NOTES tab for provider progress note. Note: Reviewed         Physical Exam  General appearance: Well nourished, well developed, and well hydrated. In no apparent acute distress Mental status: Alert, oriented x 3 (person, place, & time)       Respiratory: No evidence of acute respiratory distress Eyes: PERLA Vitals: BP (!) 159/67 (BP Location: Right Arm, Patient Position: Sitting, Cuff Size: Normal)   Pulse 72   Temp 97.6 F (36.4 C) (Temporal)   Resp 16   Ht 5' 1 (1.549 m)   Wt 144 lb (65.3 kg)   SpO2 99%   BMI 27.21 kg/m  BMI: Estimated body mass index is 27.21 kg/m as calculated from the following:   Height as of this encounter: 5' 1 (1.549 m).   Weight as of this encounter: 144 lb (65.3 kg). Ideal: Ideal  body weight: 47.8 kg (105 lb 6.1 oz) Adjusted ideal body weight: 54.8 kg (120 lb 13.2 oz)  Assessment   Diagnosis Status  1. Chronic pain syndrome   2. Lumbar facet syndrome (Bilateral) (R>L)   3. Onnie Bilis Syndrome (CBS)   4. Sacroiliac joint pain   5. Lumbar facet arthropathy   6. Chronic radicular lumbar pain   7. Impaired visual perception   8. Medication management    Controlled Controlled Controlled   Updated Problems: No problems updated.  Plan of Care  Problem-specific:  Assessment and Plan We will continue on current medication regimen.  Prescription drug monitoring (PDMP) reviewed; findings consistent with the use  of prescribed medication and no evidence of narcotic misuse or abuse. Routine UDS ordered today.  No other new issues or problems reported to this visit.  Schedule follow-up in 90 days for medication management.  Ms. Celisa Schoenberg has a current medication list which includes the following long-term medication(s): amitriptyline, esomeprazole, gabapentin , mirtazapine, montelukast , promethazine , quetiapine, ropinirole, simvastatin , and valsartan.  Pharmacotherapy (Medications Ordered): Meds ordered this encounter  Medications   oxyCODONE -acetaminophen  (PERCOCET) 7.5-325 MG tablet    Sig: Take 1 tablet by mouth every 6 (six) hours as needed for moderate pain (pain score 4-6) or severe pain (pain score 7-10). Must last 30 days.    Dispense:  120 tablet    Refill:  0    Chronic Pain: STOP Act (Not applicable) Fill 1 day early if closed on refill date. Avoid benzodiazepines within 8 hours of opioids   oxyCODONE -acetaminophen  (PERCOCET) 7.5-325 MG tablet    Sig: Take 1 tablet by mouth every 6 (six) hours as needed for moderate pain (pain score 4-6) or severe pain (pain score 7-10). Must last 30 days.    Dispense:  120 tablet    Refill:  0    Chronic Pain: STOP Act (Not applicable) Fill 1 day early if closed on refill date. Avoid benzodiazepines within 8 hours of opioids   oxyCODONE -acetaminophen  (PERCOCET) 7.5-325 MG tablet    Sig: Take 1 tablet by mouth every 6 (six) hours as needed for moderate pain (pain score 4-6) or severe pain (pain score 7-10). Must last 30 days.    Dispense:  120 tablet    Refill:  0    Chronic Pain: STOP Act (Not applicable) Fill 1 day early if closed on refill date. Avoid benzodiazepines within 8 hours of opioids   gabapentin  (NEURONTIN ) 600 MG tablet    Sig: Take 1 tablet (600 mg total) by mouth every 8 (eight) hours.    Dispense:  90 tablet    Refill:  5    Fill one day early if pharmacy is closed on scheduled refill date. May substitute for generic if available.    methocarbamol  (ROBAXIN ) 500 MG tablet    Sig: Take 1 tablet (500 mg total) by mouth every 8 (eight) hours as needed for muscle spasms.    Dispense:  90 tablet    Refill:  5    Do not place this medication, or any other prescription from our practice, on Automatic Refill. Patient may have prescription filled one day early if pharmacy is closed on scheduled refill date.   Orders:  Orders Placed This Encounter  Procedures   ToxASSURE Select 13 (MW), Urine    Volume: 30 ml(s). Minimum 3 ml of urine is needed. Document temperature of fresh sample. Indications: Long term (current) use of opiate analgesic (W09.811)    Release to patient:  Immediate        Return in about 3 months (around 10/30/2023) for (F2F), (MM), Marthe Slain NP.    Recent Visits Date Type Provider Dept  05/06/23 Office Visit Cephus Collin, MD [facility]  Showing recent visits within past 90 days and meeting all other requirements Today's Visits Date Type Provider Dept  07/30/23 Office Visit Kelliann Pendergraph K, NP [facility]  Showing today's visits and meeting all other requirements Future Appointments Date Type Provider Dept  10/28/23 Appointment Zabdi Mis K, NP [facility]  Showing future appointments within next 90 days and meeting all other requirements  I discussed the assessment and treatment plan with the patient. The patient was provided an opportunity to ask questions and all were answered. The patient agreed with the plan and demonstrated an understanding of the instructions.  Patient advised to call back or seek an in-person evaluation if the symptoms or condition worsens.  Duration of encounter: 30 minutes.  Total time on encounter, as per AMA guidelines included both the face-to-face and non-face-to-face time personally spent by the physician and/or other qualified health care professional(s) on the day of the encounter (includes time in activities that require the  physician or other qualified health care professional and does not include time in activities normally performed by clinical staff). Physician's time may include the following activities when performed: Preparing to see the patient (e.g., pre-charting review of records, searching for previously ordered imaging, lab work, and nerve conduction tests) Review of prior analgesic pharmacotherapies. Reviewing PMP Interpreting ordered tests (e.g., lab work, imaging, nerve conduction tests) Performing post-procedure evaluations, including interpretation of diagnostic procedures Obtaining and/or reviewing separately obtained history Performing a medically appropriate examination and/or evaluation Counseling and educating the patient/family/caregiver Ordering medications, tests, or procedures Referring and communicating with other health care professionals (when not separately reported) Documenting clinical information in the electronic or other health record Independently interpreting results (not separately reported) and communicating results to the patient/ family/caregiver Care coordination (not separately reported)  Note by: Teea Ducey K Kye Hedden, NP (TTS and AI technology used. I apologize for any typographical errors that were not detected and corrected.) Date: 07/30/2023; Time: 2:21 PM

## 2023-07-31 ENCOUNTER — Encounter: Admitting: Nurse Practitioner

## 2023-08-03 LAB — TOXASSURE SELECT 13 (MW), URINE

## 2023-08-07 ENCOUNTER — Encounter

## 2023-08-21 ENCOUNTER — Encounter

## 2023-09-04 ENCOUNTER — Inpatient Hospital Stay: Admission: RE | Admit: 2023-09-04 | Source: Ambulatory Visit

## 2023-10-28 ENCOUNTER — Ambulatory Visit: Attending: Nurse Practitioner | Admitting: Nurse Practitioner

## 2023-10-28 ENCOUNTER — Encounter: Admitting: Nurse Practitioner

## 2023-10-28 DIAGNOSIS — G894 Chronic pain syndrome: Secondary | ICD-10-CM

## 2023-10-28 DIAGNOSIS — M47816 Spondylosis without myelopathy or radiculopathy, lumbar region: Secondary | ICD-10-CM

## 2023-10-28 DIAGNOSIS — H5316 Psychophysical visual disturbances: Secondary | ICD-10-CM | POA: Diagnosis not present

## 2023-10-28 DIAGNOSIS — Z79899 Other long term (current) drug therapy: Secondary | ICD-10-CM

## 2023-10-28 DIAGNOSIS — M533 Sacrococcygeal disorders, not elsewhere classified: Secondary | ICD-10-CM

## 2023-10-28 MED ORDER — OXYCODONE-ACETAMINOPHEN 7.5-325 MG PO TABS: 1.0000 | ORAL_TABLET | Freq: Four times a day (QID) | ORAL | 0 refills | Status: DC | PRN

## 2023-10-28 NOTE — Progress Notes (Signed)
 PROVIDER NOTE: Interpretation of information contained herein should be left to medically-trained personnel. Specific patient instructions are provided elsewhere under Patient Instructions section of medical record. This document was created in part using AI and STT-dictation technology, any transcriptional errors that may result from this process are unintentional.  Patient: Sandra [name]  Service: E/M   PCP: Rudolpho Norleen BIRCH, MD  DOB: 09-24-1955  DOS: 10/28/2023  Provider: Emmy MARLA Blanch, NP  MRN: [id number]  Delivery: Virtual Visit  Specialty: Interventional Pain Management  Type: Established Patient  Setting: Ambulatory outpatient facility  Specialty designation: 09  Referring Prov.: Rudolpho Norleen BIRCH, MD  Location: Remote location       Virtual Encounter - Pain Management PROVIDER NOTE: Information contained herein reflects review and annotations entered in association with encounter. Interpretation of such information and data should be left to medically-trained personnel. Information provided to patient can be located elsewhere in the medical record under Patient Instructions. Document created using STT-dictation technology, any transcriptional errors that may result from process are unintentional.    Contact & Pharmacy Preferred: [phone] Home: [phone] (home) Mobile: There is no such number on file (mobile). E-mail: No e-mail address on record  [id number] - [place], [place] - [address] AT [facility] OF [address] [place] [address] Phone: [phone] Fax: [phone]   Pre-screening  Sandra [name] offered in-person vs virtual encounter. She indicated preferring virtual for this encounter.   Reason COVID-19*  Social distancing based on CDC and AMA recommendations.   I contacted Sandra [name] on 10/28/2023 via telephone.      I clearly identified myself as Emmy MARLA Blanch, NP. I verified that I was speaking with the correct person  using two identifiers (Name: Sandra [name], and date of birth: [birth year]).  Consent I sought verbal advanced consent from Sandra [name] for virtual visit interactions. I informed Sandra [name] of possible security and privacy concerns, risks, and limitations associated with providing not-in-person medical evaluation and management services. I also informed Sandra [name] of the availability of in-person appointments. Finally, I informed her that there would be a charge for the virtual visit and that she could be  personally, fully or partially, financially responsible for it. Sandra [name] expressed understanding and agreed to proceed.   Historic Elements   Sandra [name] is a 68 y.o. year old, female patient evaluated today after our last contact on 07/30/2023. Sandra [name]  has a past medical history of Anal fissure, Anemia, Anxiety (08/01/2014), Arthritis, degenerative (07/30/2013), Carlin Abrahams syndrome, Claustrophobia, Clinical depression (06/30/2014), Daily nausea, Depression, Diffuse myofascial pain syndrome, Dyspareunia, female, Elevated liver enzymes, Elevated liver enzymes, GERD (gastroesophageal reflux disease), H/O breast biopsy (01/05/2015), H/O: attempted suicide (2013), Hepatitis B, Hereditary and idiopathic peripheral neuropathy, Hiatal hernia, Hiatal hernia, History of hysterectomy (01/05/2015), History of peptic ulcer disease (01/05/2015), Hypercholesteremia, Hypertension, Incontinence, Legally blind, Non-seasonal allergic rhinitis, Opiate use, Osteoarthritis of both knees, Palpitations, Panic attacks, Postmenopausal osteoporosis, PUD (peptic ulcer disease), Rectocele (01/05/2015), Rectocele, Restless leg syndrome, Retinitis pigmentosa, S/P cholecystectomy (01/05/2015), Senile nuclear sclerosis, and Visual hallucinations. She also  has a past surgical history that includes Cholecystectomy; Rectocele repair; Abdominal hysterectomy; Appendectomy; Colonoscopy;  Esophagogastroduodenoscopy; Sphincterotomy (N/A, 01/24/2017); Hemorrhoid surgery (N/A, 03/07/2017); Hernia repair; Esophagogastroduodenoscopy (egd) with propofol  (N/A, 09/17/2017); Oophorectomy; Breast biopsy (Bilateral, 1977); and Colonoscopy (N/A, 07/09/2023). Sandra [name] has a current medication list which includes the following prescription(s): acetaminophen , amitriptyline, amlodipine , aspirin , esomeprazole, fluticasone , gabapentin , hydrochlorothiazide, melatonin, meloxicam, methocarbamol , mirtazapine,  montelukast , ondansetron , [START ON 11/09/2023] oxycodone -acetaminophen , promethazine , quetiapine, ropinirole, sertraline , simvastatin , and valsartan. She  reports that she quit smoking about 50 years ago. Her smoking use included cigarettes. She has never used smokeless tobacco. She reports that she does not drink alcohol and does not use drugs. Sandra [name] is allergic to trazodone, codeine, cortisone, and zolpidem.  BMI: Estimated body mass index is 27.21 kg/m as calculated from the following:   Height as of 07/30/23: 5' 1 (1.549 m).   Weight as of 07/30/23: 144 lb (65.3 kg). Last encounter: 07/30/2023. Last procedure: Visit date not found.  HPI  Today, she is being contacted for medication management. No change in medical history since last visit. Patient's pain is at baseline. Patient continues multimodal pain regimen as prescribed. States that it provides pain relief and improvement in functional status.  The patient reports worsening abdominal pain associated with diarrhea.  She continues to experience chronic low back pain however current pain medication regimen manage pain level and functional ability.  Pharmacotherapy Assessment  Analgesic: Oxycodone -acetaminophen  (Percocet) 7.5-325 mg every 6 hours as needed for pain. MME=45 Monitoring: [place] PMP: PDMP reviewed during this encounter.       Pharmacotherapy: No side-effects or adverse reactions reported. Compliance: No problems  identified. Effectiveness: Clinically acceptable. Plan: Refer to POC.  UDS:  Summary  Date Value Ref Range Status  07/30/2023 FINAL  Final    Comment:    ==================================================================== [employer] [facility] [employer] (MW) ==================================================================== Test                             Result       Flag       Units  Drug Present and Declared for Prescription Verification   Oxycodone                       1946         EXPECTED   ng/mg creat   Oxymorphone                    1609         EXPECTED   ng/mg creat   Noroxycodone                   5715         EXPECTED   ng/mg creat   Noroxymorphone                 1180         EXPECTED   ng/mg creat    Sources of oxycodone  are scheduled prescription medications.    Oxymorphone, noroxycodone, and noroxymorphone are expected    metabolites of oxycodone . Oxymorphone is also available as a    scheduled prescription medication.  Drug Present not Declared for Prescription Verification   Buprenorphine                   4            UNEXPECTED ng/mg creat   Norbuprenorphine               23           UNEXPECTED ng/mg creat    Source of buprenorphine  is a scheduled prescription medication.    Norbuprenorphine is an expected metabolite of buprenorphine .  ==================================================================== Test  Result    Flag   Units      Ref Range   Creatinine              158              mg/dL      >=79 ==================================================================== Declared Medications:  The flagging and interpretation on this report are based on the  following declared medications.  Unexpected results may arise from  inaccuracies in the declared medications.   **Note: The testing scope of this panel includes these medications:   Oxycodone  (Percocet)   **Note: The testing scope of this panel does not include the  following reported  medications:   Acetaminophen  (Tylenol )  Acetaminophen  (Percocet)  Amitriptyline (Elavil)  Amlodipine  (Norvasc )  Aspirin   Esomeprazole (Nexium)  Fluticasone  (Flonase )  Gabapentin  (Neurontin )  Hydrochlorothiazide (Hydrodiuril)  Melatonin  Meloxicam (Mobic)  Methocarbamol  (Robaxin )  Mirtazapine (Remeron)  Montelukast  (Singulair )  Ondansetron  (Zofran )  Promethazine  (Phenergan )  Quetiapine (Seroquel)  Ropinirole (Requip)  Sertraline  (Zoloft )  Simvastatin  (Zocor )  Valsartan (Diovan) ==================================================================== For clinical consultation, please call [phone]. ====================================================================    No results found for: MABLE OYSTER, [id number]  Laboratory Chemistry Profile   Renal Lab Results  Component Value Date   BUN 15 02/22/2022   CREATININE 0.65 02/22/2022   GFRAA >60 01/16/2017   GFRNONAA >60 02/22/2022    Hepatic Lab Results  Component Value Date   AST 29 02/22/2022   ALT 28 02/22/2022   ALBUMIN 4.9 02/22/2022   ALKPHOS 60 02/22/2022   HCVAB NON REACTIVE 06/26/2021   LIPASE 38 02/22/2022   AMMONIA 31 09/28/2020    Electrolytes Lab Results  Component Value Date   NA 140 02/22/2022   K 3.5 02/22/2022   CL 104 02/22/2022   CALCIUM 9.4 02/22/2022   MG 2.0 09/27/2020    Bone Lab Results  Component Value Date   25OHVITD1 19 (L) 02/07/2015   25OHVITD2 3.9 02/07/2015   25OHVITD3 15 02/07/2015    Inflammation (CRP: Acute Phase) (ESR: Chronic Phase) Lab Results  Component Value Date   CRP 1.3 (H) 09/27/2020   ESRSEDRATE 26 02/07/2015   LATICACIDVEN 1.1 09/27/2020         Note: Above Lab results reviewed.  Imaging  DG PAIN CLINIC C-ARM 1-60 MIN NO REPORT Fluoro was used, but no Radiologist interpretation will be provided.  Please refer to NOTES tab for provider progress note.  Assessment  The primary encounter diagnosis was Chronic pain syndrome. Diagnoses  of Medication management, Lumbar facet syndrome (Bilateral) (R>L), Carlin Abrahams Syndrome (CBS), and Sacroiliac joint pain were also pertinent to this visit.  Plan of Care  Problem-specific:  We will continue on current medication regimen.  Prescription drug monitoring (PDMP) reviewed; findings consistent with the use of prescribed medication and no evidence of narcotic misuse or abuse. Routine UDS ordered today.  No other new issues or problems reported to this visit.  Schedule follow-up in 30 days for medication management.   Sandra [name] has a current medication list which includes the following long-term medication(s): amitriptyline, esomeprazole, gabapentin , mirtazapine, montelukast , promethazine , quetiapine, ropinirole, simvastatin , and valsartan.  Pharmacotherapy (Medications Ordered): Meds ordered this encounter  Medications   oxyCODONE -acetaminophen  (PERCOCET) 7.5-325 MG tablet    Sig: Take 1 tablet by mouth every 6 (six) hours as needed for moderate pain (pain score 4-6) or severe pain (pain score 7-10). Must last 30 days.    Dispense:  120 tablet    Refill:  0  Chronic Pain: STOP Act (Not applicable) Fill 1 day early if closed on refill date. Avoid benzodiazepines within 8 hours of opioids   Orders:   Follow-up plan:   Return in about 1 month (around 11/27/2023) for (F2F), (MM), Emmy Blanch NP.          Recent Visits Date Type Provider Dept  07/30/23 Office Visit Hetal Proano K, NP [facility]  Showing recent visits within past 90 days and meeting all other requirements Today's Visits Date Type Provider Dept  10/28/23 Office Visit Elfida Shimada K, NP [facility]  Showing today's visits and meeting all other requirements Future Appointments Date Type Provider Dept  11/26/23 Appointment Jeweldean Drohan K, NP [facility]  Showing future appointments within next 90 days and meeting all other requirements  I discussed the assessment and  treatment plan with the patient. The patient was provided an opportunity to ask questions and all were answered. The patient agreed with the plan and demonstrated an understanding of the instructions.  Patient advised to call back or seek an in-person evaluation if the symptoms or condition worsens.  Duration of encounter: 30 minutes.  Note by: Emmy MARLA Blanch, NP Date: 10/28/2023; Time: 3:14 PM

## 2023-11-10 ENCOUNTER — Telehealth: Payer: Self-pay | Admitting: Nurse Practitioner

## 2023-11-10 NOTE — Telephone Encounter (Signed)
 Pharmacy

## 2023-11-10 NOTE — Telephone Encounter (Signed)
 Called pharmacy and they said that her script was ready for pick up.  Attempted to call patient and inform her but there was no answer and no answering machine picked up.

## 2023-11-10 NOTE — Telephone Encounter (Signed)
 Patient called back stating she fell and is in ED getting checked out.

## 2023-11-10 NOTE — Telephone Encounter (Signed)
 Pharmacy told patient she does not have a refill on methacarbamol, please call pharmacy and advise patient.

## 2023-11-26 ENCOUNTER — Ambulatory Visit (HOSPITAL_BASED_OUTPATIENT_CLINIC_OR_DEPARTMENT_OTHER): Admitting: Nurse Practitioner

## 2023-11-26 DIAGNOSIS — G894 Chronic pain syndrome: Secondary | ICD-10-CM

## 2023-11-26 DIAGNOSIS — Z91199 Patient's noncompliance with other medical treatment and regimen due to unspecified reason: Secondary | ICD-10-CM

## 2023-11-26 NOTE — Progress Notes (Signed)
 11/26/2023-No show

## 2023-11-27 ENCOUNTER — Ambulatory Visit: Attending: Nurse Practitioner | Admitting: Nurse Practitioner

## 2023-11-27 ENCOUNTER — Encounter: Payer: Self-pay | Admitting: Nurse Practitioner

## 2023-11-27 VITALS — BP 139/86 | HR 114 | Temp 97.2°F | Resp 18 | Ht 61.0 in | Wt 145.0 lb

## 2023-11-27 DIAGNOSIS — G894 Chronic pain syndrome: Secondary | ICD-10-CM | POA: Diagnosis not present

## 2023-11-27 DIAGNOSIS — M47816 Spondylosis without myelopathy or radiculopathy, lumbar region: Secondary | ICD-10-CM | POA: Insufficient documentation

## 2023-11-27 DIAGNOSIS — Z79899 Other long term (current) drug therapy: Secondary | ICD-10-CM | POA: Insufficient documentation

## 2023-11-27 DIAGNOSIS — M533 Sacrococcygeal disorders, not elsewhere classified: Secondary | ICD-10-CM | POA: Insufficient documentation

## 2023-11-27 DIAGNOSIS — H5316 Psychophysical visual disturbances: Secondary | ICD-10-CM | POA: Insufficient documentation

## 2023-11-27 MED ORDER — OXYCODONE-ACETAMINOPHEN 10-325 MG PO TABS: 1.0000 | ORAL_TABLET | Freq: Three times a day (TID) | ORAL | 0 refills | Status: DC | PRN

## 2023-11-27 MED ORDER — OXYCODONE-ACETAMINOPHEN 10-325 MG PO TABS: 1.0000 | ORAL_TABLET | Freq: Three times a day (TID) | ORAL | 0 refills | Status: AC | PRN

## 2023-11-27 MED ORDER — NALOXONE HCL 4 MG/0.1ML NA LIQD: 1.0000 | NASAL | 1 refills | Status: AC | PRN

## 2023-11-27 MED ORDER — METHOCARBAMOL 500 MG PO TABS: 500.0000 mg | ORAL_TABLET | Freq: Three times a day (TID) | ORAL | 5 refills | Status: AC | PRN

## 2023-11-27 NOTE — Progress Notes (Signed)
 PROVIDER NOTE: Interpretation of information contained herein should be left to medically-trained personnel. Specific patient instructions are provided elsewhere under Patient Instructions section of medical record. This document was created in part using AI and STT-dictation technology, any transcriptional errors that may result from this process are unintentional.  Patient: Sandra [name]  Service: E/M   PCP: Rudolpho Norleen BIRCH, MD  DOB: [birth year]  DOS: 11/27/2023  Provider: Emmy MARLA Blanch, NP  MRN: [id number]  Delivery: Face-to-face  Specialty: Interventional Pain Management  Type: Established Patient  Setting: Ambulatory outpatient facility  Specialty designation: 09  Referring Prov.: Rudolpho Norleen BIRCH, MD  Location: Outpatient office facility       History of present illness (HPI) [name], a 68 y.o. year old female, is here today because of her lower back pain. Sandra [name]'s primary complain today is Back Pain  Pertinent problems: Sandra [name]  has Long-term current use of opioid analgesics; Long-term prescription opioid use; Hallucinations; visual; Psychiatric disorder; Chronic low back pain (Bilateral) (R>L); Chronic bilateral knee pain; Opioid dependence (HCC); Lumbar facet arthropathy; Lumbar spondylolysis; and Lumbar facet syndrome (Bilateral) on their pertinent problem list.  Pain Assessment: Severity of Chronic pain is reported as a 10-Worst pain ever/10. Location: Back Lower/Denies. Onset: More than a month ago. Quality: Aching. Timing: Constant. Modifying factor(s): Rest. Vitals:  height is 5' 1 (1.549 m) and weight is 145 lb (65.8 kg). Her temporal temperature is 97.2 F (36.2 C) (abnormal). Her blood pressure is 139/86 and her pulse is 114 (abnormal). Her respiration is 18 and oxygen saturation is 100%.  BMI: Estimated body mass index is 27.4 kg/m as calculated from the following:   Height as of this encounter: 5' 1 (1.549 m).   Weight as of this encounter: 145 lb (65.8  kg).  Last encounter: 11/26/2023. Last procedure: Visit date not found.  Reason for encounter: medication management. No change in medical history since last visit.  Patient's pain is at baseline.  Patient continues multimodal pain regimen as prescribed.  States that it provides pain relief and improvement in functional status.  The patient continues experiencing bilateral low back pain radiating to the bilateral buttocks region and down both legs.  She reports that her current pain medication regimen does not provide adequate relief and tends to wear off quickly despite taking it every 6 hours.  We discussed adjusting her medication by increasing the dose from Percocet 7.5 mg to 10 mg with the reducing dosing frequency. Pharmacotherapy Assessment   Analgesic: Oxycodone -acetaminophen  (Percocet) 10-325 mg every 8 hours as needed for pain. (Started on 11/27/2023) Oxycodone -acetaminophen  (Percocet) 7.5-325 mg every 6 hours as needed for pain. MME=45 (Discontinue on 11/27/2023) Monitoring: [place] PMP: PDMP reviewed during this encounter.       Pharmacotherapy: No side-effects or adverse reactions reported. Compliance: No problems identified. Effectiveness: Clinically acceptable.  Sandra [name], [place]  11/27/2023  1:31 PM  Sign when Signing Visit Nursing Pain Medication Assessment:  Safety precautions to be maintained throughout the outpatient stay will include: orient to surroundings, keep bed in low position, maintain call bell within reach at all times, provide assistance with transfer out of bed and ambulation.  Medication Inspection Compliance: Pill count conducted under aseptic conditions, in front of the patient. Neither the pills nor the bottle was removed from the patient's sight at any time. Once count was completed pills were immediately returned to the patient in their original bottle.  Medication: Oxycodone /APAP Pill/Patch Count: 9.5 of 120 pills/patches remain Pill/Patch Appearance: Markings  consistent with prescribed medication Bottle Appearance: Standard pharmacy container. Clearly labeled. Filled Date: 09 / 20 / 2025 Last Medication intake:  Today    UDS:  Summary  Date Value Ref Range Status  07/30/2023 FINAL  Final    Comment:    ==================================================================== [employer] (MW) ==================================================================== Test                             Result       Flag       Units  Drug Present and Declared for Prescription Verification   Oxycodone                       1946         EXPECTED   ng/mg creat   Oxymorphone                    1609         EXPECTED   ng/mg creat   Noroxycodone                   5715         EXPECTED   ng/mg creat   Noroxymorphone                 1180         EXPECTED   ng/mg creat    Sources of oxycodone  are scheduled prescription medications.    Oxymorphone, noroxycodone, and noroxymorphone are expected    metabolites of oxycodone . Oxymorphone is also available as a    scheduled prescription medication.  Drug Present not Declared for Prescription Verification   Buprenorphine                   4            UNEXPECTED ng/mg creat   Norbuprenorphine               23           UNEXPECTED ng/mg creat    Source of buprenorphine  is a scheduled prescription medication.    Norbuprenorphine is an expected metabolite of buprenorphine .  ==================================================================== Test                      Result    Flag   Units      Ref Range   Creatinine              158              mg/dL      >=79 ==================================================================== Declared Medications:  The flagging and interpretation on this report are based on the  following declared medications.  Unexpected results may arise from  inaccuracies in the declared medications.   **Note: The testing scope of this panel includes these medications:   Oxycodone   (Percocet)   **Note: The testing scope of this panel does not include the  following reported medications:   Acetaminophen  (Tylenol )  Acetaminophen  (Percocet)  Amitriptyline (Elavil)  Amlodipine  (Norvasc )  Aspirin   Esomeprazole (Nexium)  Fluticasone  (Flonase )  Gabapentin  (Neurontin )  Hydrochlorothiazide (Hydrodiuril)  Melatonin  Meloxicam (Mobic)  Methocarbamol  (Robaxin )  Mirtazapine (Remeron)  Montelukast  (Singulair )  Ondansetron  (Zofran )  Promethazine  (Phenergan )  Quetiapine (Seroquel)  Ropinirole (Requip)  Sertraline  (Zoloft )  Simvastatin  (Zocor )  Valsartan (Diovan) ==================================================================== For clinical consultation, please call [phone]. ====================================================================     No results found for: CBDTHCR  No results found for: D8THCCBX No results found for: D9THCCBX  ROS  Constitutional: Denies any fever or chills Gastrointestinal: No reported hemesis, hematochezia, vomiting, or acute GI distress Musculoskeletal: Bilateral lumbar pain radiate down to bilateral hips down to the legs  Neurological: No reported episodes of acute onset apraxia, aphasia, dysarthria, agnosia, amnesia, paralysis, loss of coordination, or loss of consciousness  Medication Review  PARoxetine, QUEtiapine, acetaminophen , amLODipine , amitriptyline, aspirin , esomeprazole, gabapentin , hydrochlorothiazide, meloxicam, methocarbamol , mirtazapine, montelukast , naloxone , ondansetron , oxyCODONE -acetaminophen , promethazine , rOPINIRole, simvastatin , and valsartan  History Review  Allergy: Ms. Cabell is allergic to trazodone, codeine, cortisone, and zolpidem. Drug: Ms. Pestka  reports no history of drug use. Alcohol:  reports no history of alcohol use. Tobacco:  reports that she quit smoking about 50 years ago. Her smoking use included cigarettes. She has never used smokeless tobacco. Social: Ms. Ware   reports that she quit smoking about 50 years ago. Her smoking use included cigarettes. She has never used smokeless tobacco. She reports that she does not drink alcohol and does not use drugs. Medical:  has a past medical history of Anal fissure, Anemia, Anxiety (08/01/2014), Arthritis, degenerative (07/30/2013), Carlin Abrahams syndrome, Claustrophobia, Clinical depression (06/30/2014), Daily nausea, Depression, Diffuse myofascial pain syndrome, Dyspareunia, female, Elevated liver enzymes, Elevated liver enzymes, GERD (gastroesophageal reflux disease), H/O breast biopsy (01/05/2015), H/O: attempted suicide (2013), Hepatitis B, Hereditary and idiopathic peripheral neuropathy, Hiatal hernia, Hiatal hernia, History of hysterectomy (01/05/2015), History of peptic ulcer disease (01/05/2015), Hypercholesteremia, Hypertension, Incontinence, Legally blind, Non-seasonal allergic rhinitis, Opiate use, Osteoarthritis of both knees, Palpitations, Panic attacks, Postmenopausal osteoporosis, PUD (peptic ulcer disease), Rectocele (01/05/2015), Rectocele, Restless leg syndrome, Retinitis pigmentosa, S/P cholecystectomy (01/05/2015), Senile nuclear sclerosis, and Visual hallucinations. Surgical: Ms. Kunsman  has a past surgical history that includes Cholecystectomy; Rectocele repair; Abdominal hysterectomy; Appendectomy; Colonoscopy; Esophagogastroduodenoscopy; Sphincterotomy (N/A, 01/24/2017); Hemorrhoid surgery (N/A, 03/07/2017); Hernia repair; Esophagogastroduodenoscopy (egd) with propofol  (N/A, 09/17/2017); Oophorectomy; Breast biopsy (Bilateral, 1977); and Colonoscopy (N/A, 07/09/2023). Family: family history includes Bone cancer in her maternal uncle; Breast cancer in her sister; Breast cancer (age of onset: 83) in her mother; Cancer in her father; Dementia in her mother; Diabetes in her mother; Hypertension in her mother; Kidney cancer in her sister; Leukemia in her paternal grandfather and paternal grandmother; Pancreatic  cancer in her maternal aunt; Prostate cancer (age of onset: 87) in her father.  Laboratory Chemistry Profile   Renal Lab Results  Component Value Date   BUN 15 02/22/2022   CREATININE 0.65 02/22/2022   GFRAA >60 01/16/2017   GFRNONAA >60 02/22/2022    Hepatic Lab Results  Component Value Date   AST 29 02/22/2022   ALT 28 02/22/2022   ALBUMIN 4.9 02/22/2022   ALKPHOS 60 02/22/2022   HCVAB NON REACTIVE 06/26/2021   LIPASE 38 02/22/2022   AMMONIA 31 09/28/2020    Electrolytes Lab Results  Component Value Date   NA 140 02/22/2022   K 3.5 02/22/2022   CL 104 02/22/2022   CALCIUM 9.4 02/22/2022   MG 2.0 09/27/2020    Bone Lab Results  Component Value Date   25OHVITD1 19 (L) 02/07/2015   25OHVITD2 3.9 02/07/2015   25OHVITD3 15 02/07/2015    Inflammation (CRP: Acute Phase) (ESR: Chronic Phase) Lab Results  Component Value Date   CRP 1.3 (H) 09/27/2020   ESRSEDRATE 26 02/07/2015   LATICACIDVEN 1.1 09/27/2020         Note: Above Lab results reviewed.  Recent Imaging Review  DG PAIN CLINIC C-ARM 1-60 MIN  NO REPORT Fluoro was used, but no Radiologist interpretation will be provided.  Please refer to NOTES tab for provider progress note. Note: Reviewed        Physical Exam  Vitals: BP 139/86 (BP Location: Right Arm, Patient Position: Sitting)   Pulse (!) 114   Temp (!) 97.2 F (36.2 C) (Temporal)   Resp 18   Ht 5' 1 (1.549 m)   Wt 145 lb (65.8 kg)   SpO2 100%   BMI 27.40 kg/m  BMI: Estimated body mass index is 27.4 kg/m as calculated from the following:   Height as of this encounter: 5' 1 (1.549 m).   Weight as of this encounter: 145 lb (65.8 kg). Ideal: Ideal body weight: 47.8 kg (105 lb 6.1 oz) Adjusted ideal body weight: 55 kg (121 lb 3.7 oz) General appearance: Well nourished, well developed, and well hydrated. In no apparent acute distress Mental status: Alert, oriented x 3 (person, place, & time)       Respiratory: No evidence of acute respiratory  distress Eyes: PERLA  Musculoskeletal: +LBP  Assessment   Diagnosis Status  1. Chronic pain syndrome   2. Sacroiliac joint pain   3. Medication management   4. Carlin Abrahams Syndrome (CBS)   5. Lumbar facet syndrome (Bilateral) (R>L)    Controlled Controlled Controlled   Updated Problems: No problems updated.  Plan of Care  Problem-specific:  Assessment and Plan  Chronic pain syndrome: Patient's pain is well-controlled with oxycodone  and methocarbamol , we will continue with current medication regimen.  Prescribing drug monitoring (PDMP) reviewed, findings consistent with the use of prescribed medication and no evidence of narcotic misuse or abuse.  No side effects or adverse reaction reported to medication.  The patient was advised to drink more water to prevent from opioid induced constipation.  Urine drug screening (UDS) up to date and consistent with the use of medication.  Schedule follow-up in 90 days for medication management.  Sacroiliac joint pain: Medications   methocarbamol  (ROBAXIN ) 500 MG tablet    Sig: Take 1 tablet (500 mg total) by mouth every 8 (eight) hours as needed for muscle spasms.    Dispense:  90 tablet    Refill:  5    Do not place this medication, or any other prescription from our practice, on Automatic Refill. Patient may have prescription filled one day early if pharmacy is closed on scheduled refill date.   naloxone  (NARCAN ) nasal spray 4 mg/0.1 mL    Sig: Place 1 spray into the nose as needed for up to 365 doses (for opioid-induced respiratory depresssion). In case of emergency (overdose), spray once into each nostril. If no response within 3 minutes, repeat application and call 911.    Dispense:  1 each    Refill:  1    Instruct patient in proper use of device.   oxyCODONE -acetaminophen  (PERCOCET) 10-325 MG tablet    Sig: Take 1 tablet by mouth every 8 (eight) hours as needed for pain. Must last 30 days.    Dispense:  90 tablet    Refill:  0     Chronic Pain: STOP Act (Not applicable) Fill 1 day early if closed on refill date. Avoid benzodiazepines within 8 hours of opioids   oxyCODONE -acetaminophen  (PERCOCET) 10-325 MG tablet    Sig: Take 1 tablet by mouth every 8 (eight) hours as needed for pain. Must last 30 days.    Dispense:  90 tablet    Refill:  0    Chronic  Pain: STOP Act (Not applicable) Fill 1 day early if closed on refill date. Avoid benzodiazepines within 8 hours of opioids   oxyCODONE -acetaminophen  (PERCOCET) 10-325 MG tablet    Sig: Take 1 tablet by mouth every 8 (eight) hours as needed for pain. Must last 30 days.    Dispense:  90 tablet    Refill:  0    Ms. Momina Hunton has a current medication list which includes the following long-term medication(s): amitriptyline, gabapentin , montelukast , paroxetine, esomeprazole, mirtazapine, promethazine , quetiapine, ropinirole, simvastatin , and valsartan.  Pharmacotherapy (Medications Ordered): Meds ordered this encounter  Medications   methocarbamol  (ROBAXIN ) 500 MG tablet    Sig: Take 1 tablet (500 mg total) by mouth every 8 (eight) hours as needed for muscle spasms.    Dispense:  90 tablet    Refill:  5    Do not place this medication, or any other prescription from our practice, on Automatic Refill. Patient may have prescription filled one day early if pharmacy is closed on scheduled refill date.   naloxone  (NARCAN ) nasal spray 4 mg/0.1 mL    Sig: Place 1 spray into the nose as needed for up to 365 doses (for opioid-induced respiratory depresssion). In case of emergency (overdose), spray once into each nostril. If no response within 3 minutes, repeat application and call 911.    Dispense:  1 each    Refill:  1    Instruct patient in proper use of device.   oxyCODONE -acetaminophen  (PERCOCET) 10-325 MG tablet    Sig: Take 1 tablet by mouth every 8 (eight) hours as needed for pain. Must last 30 days.    Dispense:  90 tablet    Refill:  0    Chronic Pain: STOP  Act (Not applicable) Fill 1 day early if closed on refill date. Avoid benzodiazepines within 8 hours of opioids   oxyCODONE -acetaminophen  (PERCOCET) 10-325 MG tablet    Sig: Take 1 tablet by mouth every 8 (eight) hours as needed for pain. Must last 30 days.    Dispense:  90 tablet    Refill:  0    Chronic Pain: STOP Act (Not applicable) Fill 1 day early if closed on refill date. Avoid benzodiazepines within 8 hours of opioids   oxyCODONE -acetaminophen  (PERCOCET) 10-325 MG tablet    Sig: Take 1 tablet by mouth every 8 (eight) hours as needed for pain. Must last 30 days.    Dispense:  90 tablet    Refill:  0    Chronic Pain: STOP Act (Not applicable) Fill 1 day early if closed on refill date. Avoid benzodiazepines within 8 hours of opioids   Orders:  No orders of the defined types were placed in this encounter.       Return in about 3 months (around 02/27/2024) for (F2F), (MM), Emmy Blanch NP.    Recent Visits Date Type Provider Dept  11/26/23 Office Visit Valborg Friar K, NP [facility]  10/28/23 Office Visit Raphaela Cannaday K, NP [facility]  Showing recent visits within past 90 days and meeting all other requirements Today's Visits Date Type Provider Dept  11/27/23 Office Visit Tanita Palinkas K, NP [facility]  Showing today's visits and meeting all other requirements Future Appointments Date Type Provider Dept  02/23/24 Appointment Richanda Darin K, NP [facility]  Showing future appointments within next 90 days and meeting all other requirements  I discussed the assessment and treatment plan with the patient. The patient was provided an opportunity to  ask questions and all were answered. The patient agreed with the plan and demonstrated an understanding of the instructions.  Patient advised to call back or seek an in-person evaluation if the symptoms or condition worsens.  I personally spent a total of 30 minutes in the care of the patient today  including preparing to see the patient, getting/reviewing separately obtained history, performing a medically appropriate exam/evaluation, counseling and educating, placing orders, referring and communicating with other health care professionals, documenting clinical information in the EHR, independently interpreting results, communicating results, and coordinating care.   Note by: Emmy MARLA Blanch, NP Date: 11/27/2023; Time: 1:57 PM

## 2023-11-27 NOTE — Progress Notes (Signed)
 Nursing Pain Medication Assessment:  Safety precautions to be maintained throughout the outpatient stay will include: orient to surroundings, keep bed in low position, maintain call bell within reach at all times, provide assistance with transfer out of bed and ambulation.  Medication Inspection Compliance: Pill count conducted under aseptic conditions, in front of the patient. Neither the pills nor the bottle was removed from the patient's sight at any time. Once count was completed pills were immediately returned to the patient in their original bottle.  Medication: Oxycodone /APAP Pill/Patch Count: 9.5 of 120 pills/patches remain Pill/Patch Appearance: Markings consistent with prescribed medication Bottle Appearance: Standard pharmacy container. Clearly labeled. Filled Date: 09 / 20 / 2025 Last Medication intake:  Today

## 2023-12-29 ENCOUNTER — Telehealth: Payer: Self-pay | Admitting: Student in an Organized Health Care Education/Training Program

## 2023-12-29 NOTE — Telephone Encounter (Signed)
 Pain is increasing and need to speak with a nurse.

## 2024-02-23 ENCOUNTER — Encounter: Admitting: Nurse Practitioner

## 2024-02-29 NOTE — Progress Notes (Unsigned)
 PROVIDER NOTE: Interpretation of information contained herein should be left to medically-trained personnel. Specific patient instructions are provided elsewhere under Patient Instructions section of medical record. This document was created in part using AI and STT-dictation technology, any transcriptional errors that may result from this process are unintentional.  Patient: Sandra [name]  Service: E/M   PCP: Vicci Duwaine SQUIBB, DO  DOB: 07-06-1958  DOS: 03/01/2024  Provider: Emmy MARLA Blanch, NP  MRN: [id number]  Delivery: Face-to-face  Specialty: Interventional Pain Management  Type: Established Patient  Setting: Ambulatory outpatient facility  Specialty designation: 09  Referring Prov.: Vicci Duwaine SQUIBB, DO  Location: Outpatient office facility       History of present illness (HPI) Sandra [name], a 69 y.o. year old female, is here today because of her No primary diagnosis found.. Sandra [name] primary complain today is No chief complaint on file.  Pertinent problems: Sandra [name] has Major depression; Mild cognitive impairment; Essential hypertension, benign; Long term current use of opiate analgesic; Long term prescription opiate use; Opiate use (22.5 MME/Day); Neurogenic pain; Neuropathic pain; Visceral pain; Vaginal pain (2ry area of Pain); Chronic pelvic pain in female (1ry area of Pain); Encounter for therapeutic drug level monitoring; Encounter for chronic pain management; Fibromyalgia; Chronic pain syndrome; Pharmacologic therapy; Disorder of skeletal system; Problems influencing health status; Uncomplicated opioid dependence (HCC); Chronic use of opiate for therapeutic purpose; Chronic low back pain (Midline) w/o sciatica; and Chronic shoulder pain (Right) on their pertinent problem list.  Pain Assessment: Severity of   is reported as a  /10. Location:    / . Onset:  . Quality:  . Timing:  . Modifying factor(s):  SABRA Vitals:  vitals were not taken for this visit.  BMI: Estimated body mass  index is 29.52 kg/m as calculated from the following:   Height as of 02/23/24: 5' 2 (1.575 m).   Weight as of 02/23/24: 161 lb 6.4 oz (73.2 kg).  Last encounter: 12/04/2023. Last procedure: Visit date not found.  Reason for encounter:  *** .   Discussed the use of AI scribe software for clinical note transcription with the patient, who gave verbal consent to proceed.  History of Present Illness           Pharmacotherapy Assessment   Oxycodone -acetaminophen  (Percocet) 7.5-325 mg tablet 2 times daily as needed for severe pain. MME=22.50  Monitoring: [place] PMP: PDMP reviewed during this encounter.       Pharmacotherapy: No side-effects or adverse reactions reported. Compliance: No problems identified. Effectiveness: Clinically acceptable.  [name], [place]  12/04/2023  9:15 AM  Sign when Signing Visit Nursing Pain Medication Assessment:  Safety precautions to be maintained throughout the outpatient stay will include: orient to surroundings, keep bed in low position, maintain call bell within reach at all times, provide assistance with transfer out of bed and ambulation.  Medication Inspection Compliance: Pill count conducted under aseptic conditions, in front of the patient. Neither the pills nor the bottle was removed from the patient's sight at any time. Once count was completed pills were immediately returned to the patient in their original bottle.  Medication: Oxycodone /APAP Pill/Patch Count: 0 of 60 pills/patches remain Pill/Patch Appearance: Markings consistent with prescribed medication Bottle Appearance: Standard pharmacy container. Clearly labeled. Filled Date: 09 / 23 / 2025 Last Medication intake:  Ran out of medicine more than 48 hours ago    UDS:  Summary  Date Value Ref Range Status  06/11/2023 FINAL  Final  Comment:    ==================================================================== [employer] Select 13  (MW) ==================================================================== Specimen Alert Not Detected result may be consistent with the time of last use noted for this medication. AS NEEDED (Oxycodone ) ==================================================================== Test                             Result       Flag       Units  Drug Absent but Declared for Prescription Verification   Oxycodone                       Not Detected UNEXPECTED ng/mg creat ==================================================================== Test                      Result    Flag   Units      Ref Range   Creatinine              43               mg/dL      >=79 ==================================================================== Declared Medications:  The flagging and interpretation on this report are based on the  following declared medications.  Unexpected results may arise from  inaccuracies in the declared medications.   **Note: The testing scope of this panel includes these medications:   Oxycodone  (Percocet)   **Note: The testing scope of this panel does not include the  following reported medications:   Acetaminophen  (Percocet)  Amitriptyline  (Elavil )  Budesonide  (Symbicort )  Doxycycline   Fluticasone (Flonase)  Formoterol  (Symbicort )  Hydrocortisone   Ibuprofen (Advil)  Ivermectin   Levothyroxine  (Synthroid )  Lisinopril  (Zestril )  Loperamide (Imodium)  Multivitamin  Naloxone  (Narcan )  Venlafaxine  (Effexor ) ==================================================================== For clinical consultation, please call [phone]. ====================================================================     No results found for: CBDTHCR No results found for: D8THCCBX No results found for: D9THCCBX  ROS  Constitutional: Denies any fever or chills Gastrointestinal: No reported hemesis, hematochezia, vomiting, or acute GI distress Musculoskeletal: Bilateral perineal  pain Neurological: No reported episodes of acute onset apraxia, aphasia, dysarthria, agnosia, amnesia, paralysis, loss of coordination, or loss of consciousness  Medication Review  Ivermectin , Threonine, amitriptyline , budesonide -formoterol , fluticasone, hydrocortisone , ibuprofen, levothyroxine , lisinopril , loperamide, multivitamin, naloxone , oxyCODONE -acetaminophen , and venlafaxine  XR  History Review  Allergy: Sandra [name] has no known allergies. Drug: Sandra [name]  reports no history of drug use. Alcohol:  reports current alcohol use. Tobacco:  reports that she has never smoked. She has never used smokeless tobacco. Social: Sandra [name]  reports that she has never smoked. She has never used smokeless tobacco. She reports current alcohol use. She reports that she does not use drugs. Medical:  has a past medical history of Actinic keratosis, Back pain, Basal cell carcinoma (02/08/2010), Bowel trouble (2003), Breast screening, unspecified, Bronchitis, COLD (chronic obstructive lung disease) (HCC), COVID-19, Diffuse cystic mastopathy, Family history of malignant neoplasm of breast, Hypothyroidism, Lump or mass in breast, and Unspecified essential hypertension (2013). Surgical: Ms. Bowron  has a past surgical history that includes Ganglion cyst excision; Cesarean section (1995); Breast mass excision (Right, 2013); Rectal surgery (2002); Upper gi endoscopy (2003); Breast cyst excision (Right, 2013); Breast biopsy (Left, 03/13/2023); Breast biopsy (Left, 03/13/2023); and Colonoscopy with propofol  (N/A, 04/09/2023). Family: family history includes Breast cancer (age of onset: 58) in her mother; Depression in her daughter; Diabetes in her father and mother; Heart disease in her father and mother; Hypertension in her father and mother.  Laboratory Chemistry Profile   Renal  Lab Results  Component Value Date   BUN 11 08/19/2023   CREATININE 0.67 08/19/2023   LABCREA 67.9 11/04/2014   BCR 16 08/19/2023   GFRAA 99  12/20/2019   GFRNONAA 86 12/20/2019    Hepatic Lab Results  Component Value Date   AST 14 02/07/2023   ALT 16 02/07/2023   ALBUMIN 4.4 02/07/2023   ALKPHOS 82 02/07/2023    Electrolytes Lab Results  Component Value Date   NA 139 08/19/2023   K 4.2 08/19/2023   CL 101 08/19/2023   CALCIUM 9.2 08/19/2023   MG 2.1 03/27/2015    Bone Lab Results  Component Value Date   VD25OH 35.4 01/09/2016   VD125OH2TOT 52.3 01/25/2015    Inflammation (CRP: Acute Phase) (ESR: Chronic Phase) Lab Results  Component Value Date   CRP 1.1 (H) 03/27/2015   ESRSEDRATE 2 07/31/2016         Note: Above Lab results reviewed.  Recent Imaging Review  US  RT LOWER EXTREM LTD SOFT TISSUE NON VASCULAR CLINICAL DATA:  Right knee pain  EXAM: ULTRASOUND RIGHT LOWER EXTREMITY LIMITED  TECHNIQUE: Ultrasound examination of the lower extremity soft tissues was performed in the area of clinical concern.  COMPARISON:  None Available.  FINDINGS: Scanning in the area of clinical concern reveals a popliteal cyst which measures 3.7 x 1.3 x 1.8 cm. No evidence of rupture is seen. No other focal abnormality is noted.  IMPRESSION: Right popliteal cyst as described.  Electronically Signed   By: Oneil Devonshire M.D.   On: 11/06/2023 20:05 Note: Reviewed        Physical Exam  Vitals: BP 120/81 (BP Location: Right Arm, Patient Position: Sitting, Cuff Size: Normal)   Pulse 81   Temp (!) 97.2 F (36.2 C) (Temporal)   Resp 18   Ht 5' 2 (1.575 m)   Wt 160 lb (72.6 kg)   LMP  (LMP Unknown)   SpO2 98%   BMI 29.26 kg/m  BMI: Estimated body mass index is 29.26 kg/m as calculated from the following:   Height as of this encounter: 5' 2 (1.575 m).   Weight as of this encounter: 160 lb (72.6 kg). Ideal: Ideal body weight: 50.1 kg (110 lb 7.2 oz) Adjusted ideal body weight: 59.1 kg (130 lb 4.3 oz) General appearance: Well nourished, well developed, and well hydrated. In no apparent acute distress Mental  status: Alert, oriented x 3 (person, place, & time)       Respiratory: No evidence of acute respiratory distress Eyes: PERLA  Musculoskeletal: + Perineal pain Assessment   Diagnosis Status  1. Chronic pelvic pain in female (1ry area of Pain)   2. Chronic pain syndrome   3. Fibromyalgia   4. Vaginal pain (2ry area of Pain)   5. Visceral pain   6. Chronic use of opiate for therapeutic purpose   7. Encounter for chronic pain management    Controlled Controlled Controlled   Updated Problems: No problems updated.  Plan of Care  Problem-specific:  Assessment and Plan    Monitoring: [place] PMP: PDMP not reviewed this encounter.       Pharmacotherapy: No side-effects or adverse reactions reported. Compliance: No problems identified. Effectiveness: Clinically acceptable.  No notes on file  UDS:  Summary  Date Value Ref Range Status  06/11/2023 FINAL  Final    Comment:    ==================================================================== [employer] Select 13 (MW) ==================================================================== Specimen Alert Not Detected result may be consistent with the time of last use noted for  this medication. AS NEEDED (Oxycodone ) ==================================================================== Test                             Result       Flag       Units  Drug Absent but Declared for Prescription Verification   Oxycodone                       Not Detected UNEXPECTED ng/mg creat ==================================================================== Test                      Result    Flag   Units      Ref Range   Creatinine              43               mg/dL      >=79 ==================================================================== Declared Medications:  The flagging and interpretation on this report are based on the  following declared medications.  Unexpected results may arise from  inaccuracies in the declared medications.   **Note:  The testing scope of this panel includes these medications:   Oxycodone  (Percocet)   **Note: The testing scope of this panel does not include the  following reported medications:   Acetaminophen  (Percocet)  Amitriptyline  (Elavil )  Budesonide  (Symbicort )  Doxycycline   Fluticasone (Flonase)  Formoterol  (Symbicort )  Hydrocortisone   Ibuprofen (Advil)  Ivermectin   Levothyroxine  (Synthroid )  Lisinopril  (Zestril )  Loperamide (Imodium)  Multivitamin  Naloxone  (Narcan )  Venlafaxine  (Effexor ) ==================================================================== For clinical consultation, please call [phone]. ====================================================================     No results found for: CBDTHCR No results found for: D8THCCBX No results found for: D9THCCBX  ROS  Constitutional: Denies any fever or chills Gastrointestinal: No reported hemesis, hematochezia, vomiting, or acute GI distress Musculoskeletal: Denies any acute onset joint swelling, redness, loss of ROM, or weakness Neurological: No reported episodes of acute onset apraxia, aphasia, dysarthria, agnosia, amnesia, paralysis, loss of coordination, or loss of consciousness  Medication Review  Azelaic Acid , Threonine, amitriptyline , budesonide -formoterol , fluticasone, hydrocortisone , ibuprofen, levothyroxine , lisinopril , loperamide, multivitamin, naloxone , oxyCODONE -acetaminophen , and venlafaxine  XR  History Review  Allergy: Sandra [name] has no known allergies. Drug: Sandra [name]  reports no history of drug use. Alcohol:  reports current alcohol use. Tobacco:  reports that she has never smoked. She has never used smokeless tobacco. Social: Sandra [name]  reports that she has never smoked. She has never used smokeless tobacco. She reports current alcohol use. She reports that she does not use drugs. Medical:  has a past medical history of Actinic keratosis, Back pain, Basal cell carcinoma (02/08/2010), Bowel  trouble (2003), Breast screening, unspecified, Bronchitis, COLD (chronic obstructive lung disease) (HCC), COVID-19, Diffuse cystic mastopathy, Family history of malignant neoplasm of breast, Hypothyroidism, Lump or mass in breast, and Unspecified essential hypertension (2013). Surgical: Sandra [name]  has a past surgical history that includes Ganglion cyst excision; Cesarean section (1995); Breast mass excision (Right, 2013); Rectal surgery (2002); Upper gi endoscopy (2003); Breast cyst excision (Right, 2013); Breast biopsy (Left, 03/13/2023); Breast biopsy (Left, 03/13/2023); and Colonoscopy with propofol  (N/A, 04/09/2023). Family: family history includes Breast cancer (age of onset: 22) in her mother; Depression in her daughter; Diabetes in her father and mother; Heart disease in her father and mother; Hypertension in her father and mother.  Laboratory Chemistry Profile   Renal Lab Results  Component Value Date   BUN 14 02/23/2024   CREATININE 0.73 02/23/2024  LABCREA 67.9 11/04/2014   BCR 19 02/23/2024   GFRAA 99 12/20/2019   GFRNONAA 86 12/20/2019    Hepatic Lab Results  Component Value Date   AST 16 02/23/2024   ALT 22 02/23/2024   ALBUMIN 4.8 02/23/2024   ALKPHOS 79 02/23/2024    Electrolytes Lab Results  Component Value Date   NA 139 02/23/2024   K 4.4 02/23/2024   CL 100 02/23/2024   CALCIUM 9.8 02/23/2024   MG 2.1 03/27/2015    Bone Lab Results  Component Value Date   VD25OH 35.4 01/09/2016   VD125OH2TOT 52.3 01/25/2015    Inflammation (CRP: Acute Phase) (ESR: Chronic Phase) Lab Results  Component Value Date   CRP 1.1 (H) 03/27/2015   ESRSEDRATE 2 07/31/2016         Note: Above Lab results reviewed.  Recent Imaging Review  US  RT LOWER EXTREM LTD SOFT TISSUE NON VASCULAR CLINICAL DATA:  Right knee pain  EXAM: ULTRASOUND RIGHT LOWER EXTREMITY LIMITED  TECHNIQUE: Ultrasound examination of the lower extremity soft tissues was performed in the area of clinical  concern.  COMPARISON:  None Available.  FINDINGS: Scanning in the area of clinical concern reveals a popliteal cyst which measures 3.7 x 1.3 x 1.8 cm. No evidence of rupture is seen. No other focal abnormality is noted.  IMPRESSION: Right popliteal cyst as described.  Electronically Signed   By: Oneil Devonshire M.D.   On: 11/06/2023 20:05 Note: Reviewed        Physical Exam  Vitals: LMP  (LMP Unknown)  BMI: Estimated body mass index is 29.52 kg/m as calculated from the following:   Height as of 02/23/24: 5' 2 (1.575 m).   Weight as of 02/23/24: 161 lb 6.4 oz (73.2 kg). Ideal: Ideal body weight: 50.1 kg (110 lb 7.2 oz) Adjusted ideal body weight: 59.3 kg (130 lb 13.3 oz) General appearance: Well nourished, well developed, and well hydrated. In no apparent acute distress Mental status: Alert, oriented x 3 (person, place, & time)       Respiratory: No evidence of acute respiratory distress Eyes: PERLA   Assessment   Diagnosis Status  No diagnosis found. Controlled Controlled Controlled   Updated Problems: No problems updated.  Plan of Care  Problem-specific:  Assessment and Plan            Sandra [name] has a current medication list which includes the following long-term medication(s): amitriptyline , budesonide -formoterol , fluticasone, levothyroxine , lisinopril , oxycodone -acetaminophen , oxycodone -acetaminophen , oxycodone -acetaminophen , and venlafaxine  xr.  Pharmacotherapy (Medications Ordered): No orders of the defined types were placed in this encounter.  Orders:  No orders of the defined types were placed in this encounter.    {There is no content from the last Plan section.}   No follow-ups on file.    Recent Visits Date Type Provider Dept  12/04/23 Office Visit Menelik Mcfarren K, NP [facility]  Showing recent visits within past 90 days and meeting all other requirements Future Appointments Date Type Provider Dept  03/01/24 Appointment  Somtochukwu Woollard K, NP [facility]  Showing future appointments within next 90 days and meeting all other requirements  I discussed the assessment and treatment plan with the patient. The patient was provided an opportunity to ask questions and all were answered. The patient agreed with the plan and demonstrated an understanding of the instructions.  Patient advised to call back or seek an in-person evaluation if the symptoms or condition worsens.  Duration of encounter: *** minutes.  Total time on encounter, as per AMA guidelines included both the face-to-face and non-face-to-face time personally spent by the physician and/or other qualified health care professional(s) on the day of the encounter (includes time in activities that require the physician or other qualified health care professional and does not include time in activities normally performed by clinical staff). Physician's time may include the following activities when performed: Preparing to see the patient (e.g., pre-charting review of records, searching for previously ordered imaging, lab work, and nerve conduction tests) Review of prior analgesic pharmacotherapies. Reviewing PMP Interpreting ordered tests (e.g., lab work, imaging, nerve conduction tests) Performing post-procedure evaluations, including interpretation of diagnostic procedures Obtaining and/or reviewing separately obtained history Performing a medically appropriate examination and/or evaluation Counseling and educating the patient/family/caregiver Ordering medications, tests, or procedures Referring and communicating with other health care professionals (when not separately reported) Documenting clinical information in the electronic or other health record Independently interpreting results (not separately reported) and communicating results to the patient/ family/caregiver Care coordination (not separately reported)  Note by: Lorenza Shakir K Kayte Borchard, NP (TTS and AI  technology used. I apologize for any typographical errors that were not detected and corrected.) Date: 03/01/2024; Time: 8:41 AM

## 2024-03-01 ENCOUNTER — Ambulatory Visit (HOSPITAL_BASED_OUTPATIENT_CLINIC_OR_DEPARTMENT_OTHER): Admitting: Nurse Practitioner

## 2024-03-01 DIAGNOSIS — G894 Chronic pain syndrome: Secondary | ICD-10-CM

## 2024-03-01 DIAGNOSIS — Z91199 Patient's noncompliance with other medical treatment and regimen due to unspecified reason: Secondary | ICD-10-CM

## 2024-03-04 NOTE — Progress Notes (Signed)
 PROVIDER NOTE: Interpretation of information contained herein should be left to medically-trained personnel. Specific patient instructions are provided elsewhere under Patient Instructions section of medical record. This document was created in part using AI and STT-dictation technology, any transcriptional errors that may result from this process are unintentional.  Patient: Sandra [name]  Service: E/M   PCP: Rudolpho Norleen BIRCH, MD  DOB: [birth year]  DOS: 03/08/2024  Provider: Emmy MARLA Blanch, NP  MRN: [id number]  Delivery: Virtual Visit  Specialty: Interventional Pain Management  Type: Established Patient  Setting: Ambulatory outpatient facility  Specialty designation: 09  Referring Prov.: Rudolpho Norleen BIRCH, MD  Location: Remote location       Virtual Encounter - Pain Management PROVIDER NOTE: Information contained herein reflects review and annotations entered in association with encounter. Interpretation of such information and data should be left to medically-trained personnel. Information provided to patient can be located elsewhere in the medical record under Patient Instructions. Document created using STT-dictation technology, any transcriptional errors that may result from process are unintentional.    Contact & Pharmacy Preferred: [phone] Home: [phone] (home) Mobile: There is no such number on file (mobile). E-mail: No e-mail address on record  [id number] - [place], [place] - [address] AT [facility] OF [address] [place] [address] [place] [address] Phone: [phone] Fax: [phone]   Pre-screening  [name] offered in-person vs virtual encounter. She indicated preferring virtual for this encounter.   Reason COVID-19*  Social distancing based on CDC and AMA recommendations.   I contacted Anyi Fels on 03/08/2024 via telephone.      I clearly identified myself as Emmy MARLA Blanch, NP. I verified that I was speaking with the correct  person using two identifiers (Name: Shunte Senseney, and date of birth: [birth year]).  Consent I sought verbal advanced consent from Sandra [name] for virtual visit interactions. I informed [name] of possible security and privacy concerns, risks, and limitations associated with providing not-in-person medical evaluation and management services. I also informed [name] of the availability of in-person appointments. Finally, I informed her that there would be a charge for the virtual visit and that she could be  personally, fully or partially, financially responsible for it. [name] expressed understanding and agreed to proceed.   Historic Elements   [name] is a 69 y.o. year old, female patient evaluated today after our last contact on 03/08/2024. Ms. Douglas  has a past medical history of Anal fissure, Anemia, Anxiety (08/01/2014), Arthritis, degenerative (07/30/2013), Carlin Abrahams syndrome, Claustrophobia, Clinical depression (06/30/2014), Daily nausea, Depression, Diffuse myofascial pain syndrome, Dyspareunia, female, Elevated liver enzymes, Elevated liver enzymes, GERD (gastroesophageal reflux disease), H/O breast biopsy (01/05/2015), H/O: attempted suicide (2013), Hepatitis B, Hereditary and idiopathic peripheral neuropathy, Hiatal hernia, Hiatal hernia, History of hysterectomy (01/05/2015), History of peptic ulcer disease (01/05/2015), Hypercholesteremia, Hypertension, Incontinence, Legally blind, Non-seasonal allergic rhinitis, Opiate use, Osteoarthritis of both knees, Palpitations, Panic attacks, Postmenopausal osteoporosis, PUD (peptic ulcer disease), Rectocele (01/05/2015), Rectocele, Restless leg syndrome, Retinitis pigmentosa, S/P cholecystectomy (01/05/2015), Senile nuclear sclerosis, and Visual hallucinations. She also  has a past surgical history that includes Cholecystectomy; Rectocele repair; Abdominal hysterectomy; Appendectomy; Colonoscopy;  Esophagogastroduodenoscopy; Sphincterotomy (N/A, 01/24/2017); Hemorrhoid surgery (N/A, 03/07/2017); Hernia repair; Esophagogastroduodenoscopy (egd) with propofol  (N/A, 09/17/2017); Oophorectomy; Breast biopsy (Bilateral, 1977); and Colonoscopy (N/A, 07/09/2023). [name] has a current medication list which includes the following prescription(s): acetaminophen , amitriptyline, amlodipine , aspirin , gabapentin , hydrochlorothiazide, meloxicam, methocarbamol , montelukast , naloxone , ondansetron , paroxetine,  promethazine , simvastatin , valsartan, esomeprazole, mirtazapine, oxycodone -acetaminophen , quetiapine, and ropinirole. She  reports that she quit smoking about 50 years ago. Her smoking use included cigarettes. She has never used smokeless tobacco. She reports that she does not drink alcohol and does not use drugs. [name] is allergic to trazodone, codeine, cortisone, and zolpidem.  BMI: Estimated body mass index is 27.4 kg/m as calculated from the following:   Height as of 11/27/23: 5' 1 (1.549 m).   Weight as of 11/27/23: 145 lb (65.8 kg). Last encounter: 11/27/2023. Last procedure: Visit date not found.  HPI  Today, she is being contacted for medication management.No change in medical history since last visit.  Patient's pain is at baseline.  Patient continues multimodal pain regimen as prescribed.  States that it provides pain relief and improvement in functional status.  The patient continues to experience bilateral low back pain and neck pain that severe enough to impact her daily activities.  She continues manage with Percocet every 8 hours as needed for pain.  The patient reports flulike symptoms and is unable to come to the clinic for her scheduled visit. Pharmacotherapy Assessment  Analgesic: Oxycodone -acetaminophen  (Percocet) 10-325 mg every 8 hours as needed for pain. (Started on 11/27/2023) [place] PMP: PDMP reviewed during this encounter.       Pharmacotherapy: No side-effects or adverse  reactions reported. Compliance: No problems identified. Effectiveness: Clinically acceptable. Plan: Refer to POC.  UDS:  Summary  Date Value Ref Range Status  07/30/2023 FINAL  Final    Comment:    ==================================================================== [employer] [facility] [employer] (MW) ==================================================================== Test                             Result       Flag       Units  Drug Present and Declared for Prescription Verification   Oxycodone                       1946         EXPECTED   ng/mg creat   Oxymorphone                    1609         EXPECTED   ng/mg creat   Noroxycodone                   5715         EXPECTED   ng/mg creat   Noroxymorphone                 1180         EXPECTED   ng/mg creat    Sources of oxycodone  are scheduled prescription medications.    Oxymorphone, noroxycodone, and noroxymorphone are expected    metabolites of oxycodone . Oxymorphone is also available as a    scheduled prescription medication.  Drug Present not Declared for Prescription Verification   Buprenorphine                   4            UNEXPECTED ng/mg creat   Norbuprenorphine               23           UNEXPECTED ng/mg creat    Source of buprenorphine  is a scheduled prescription medication.    Norbuprenorphine is an expected metabolite of buprenorphine .  ==================================================================== Test  Result    Flag   Units      Ref Range   Creatinine              158              mg/dL      >=79 ==================================================================== Declared Medications:  The flagging and interpretation on this report are based on the  following declared medications.  Unexpected results may arise from  inaccuracies in the declared medications.   **Note: The testing scope of this panel includes these medications:   Oxycodone  (Percocet)   **Note: The testing scope of this  panel does not include the  following reported medications:   Acetaminophen  (Tylenol )  Acetaminophen  (Percocet)  Amitriptyline (Elavil)  Amlodipine  (Norvasc )  Aspirin   Esomeprazole (Nexium)  Fluticasone  (Flonase )  Gabapentin  (Neurontin )  Hydrochlorothiazide (Hydrodiuril)  Melatonin  Meloxicam (Mobic)  Methocarbamol  (Robaxin )  Mirtazapine (Remeron)  Montelukast  (Singulair )  Ondansetron  (Zofran )  Promethazine  (Phenergan )  Quetiapine (Seroquel)  Ropinirole (Requip)  Sertraline  (Zoloft )  Simvastatin  (Zocor )  Valsartan (Diovan) ==================================================================== For clinical consultation, please call [phone]. ====================================================================    No results found for: MABLE OYSTER, [id number]  Laboratory Chemistry Profile   Renal Lab Results  Component Value Date   BUN 15 02/22/2022   CREATININE 0.65 02/22/2022   GFRAA >60 01/16/2017   GFRNONAA >60 02/22/2022    Hepatic Lab Results  Component Value Date   AST 29 02/22/2022   ALT 28 02/22/2022   ALBUMIN 4.9 02/22/2022   ALKPHOS 60 02/22/2022   HCVAB NON REACTIVE 06/26/2021   LIPASE 38 02/22/2022   AMMONIA 31 09/28/2020    Electrolytes Lab Results  Component Value Date   NA 140 02/22/2022   K 3.5 02/22/2022   CL 104 02/22/2022   CALCIUM 9.4 02/22/2022   MG 2.0 09/27/2020    Bone Lab Results  Component Value Date   25OHVITD1 19 (L) 02/07/2015   25OHVITD2 3.9 02/07/2015   25OHVITD3 15 02/07/2015    Inflammation (CRP: Acute Phase) (ESR: Chronic Phase) Lab Results  Component Value Date   CRP 1.3 (H) 09/27/2020   ESRSEDRATE 26 02/07/2015   LATICACIDVEN 1.1 09/27/2020         Note: Above Lab results reviewed.  Imaging  DG PAIN CLINIC C-ARM 1-60 MIN NO REPORT Fluoro was used, but no Radiologist interpretation will be provided.  Please refer to NOTES tab for provider progress note.  Assessment  The primary  encounter diagnosis was Medication management. Diagnoses of Chronic pain syndrome, Lumbar facet syndrome (Bilateral) (R>L), Carlin Abrahams Syndrome (CBS), Sacroiliac joint pain, Lumbar facet arthropathy, Chronic radicular lumbar pain, SI joint arthritis, and Chronic lower extremity pain (Bilateral) were also pertinent to this visit.  Plan of Care  Problem-specific:  Medication management: Patient's pain is controlled with Percocet, will continue on current medication regimen.  Prescription drug monitoring (PDMP) reviewed, findings consistent with the use of prescribed medication nor evidence of narcotic misuse or abuse.  Urine drug screen (UDS) up to date and consistent with the use of prescribed medication.  No side effects or adverse reaction reported to medication.  Schedule follow-up in 30 days for medication management.  Ms. Kanetra Ho has a current medication list which includes the following long-term medication(s): amitriptyline, gabapentin , montelukast , paroxetine, promethazine , simvastatin , valsartan, esomeprazole, mirtazapine, quetiapine, and ropinirole.  Pharmacotherapy (Medications Ordered): Meds ordered this encounter  Medications   oxyCODONE -acetaminophen  (PERCOCET) 10-325 MG tablet    Sig: Take 1 tablet by mouth every 8 (eight) hours as needed  for pain. Must last 30 days.    Dispense:  90 tablet    Refill:  0    Chronic Pain: STOP Act (Not applicable) Fill 1 day early if closed on refill date. Avoid benzodiazepines within 8 hours of opioids   Orders:  No orders of the defined types were placed in this encounter.  Follow-up plan:   Return in about 1 month (around 04/08/2024) for (F2F), (MM), Emmy Blanch NP.         Recent Visits No visits were found meeting these conditions. Showing recent visits within past 90 days and meeting all other requirements Today's Visits Date Type Provider Dept  03/08/24 Office Visit Marlo Arriola K, NP [facility]  Showing  today's visits and meeting all other requirements Future Appointments Date Type Provider Dept  04/06/24 Appointment Merissa Renwick K, NP [facility]  Showing future appointments within next 90 days and meeting all other requirements  I discussed the assessment and treatment plan with the patient. The patient was provided an opportunity to ask questions and all were answered. The patient agreed with the plan and demonstrated an understanding of the instructions.  Patient advised to call back or seek an in-person evaluation if the symptoms or condition worsens.  Duration of encounter: 30 minutes.  Note by: Emmy MARLA Blanch, NP Date: 03/08/2024; Time: 12:25 PM

## 2024-03-08 ENCOUNTER — Telehealth: Payer: Self-pay | Admitting: Nurse Practitioner

## 2024-03-08 ENCOUNTER — Ambulatory Visit: Attending: Nurse Practitioner | Admitting: Nurse Practitioner

## 2024-03-08 ENCOUNTER — Encounter: Payer: Self-pay | Admitting: Nurse Practitioner

## 2024-03-08 DIAGNOSIS — H5316 Psychophysical visual disturbances: Secondary | ICD-10-CM

## 2024-03-08 DIAGNOSIS — M5416 Radiculopathy, lumbar region: Secondary | ICD-10-CM

## 2024-03-08 DIAGNOSIS — M461 Sacroiliitis, not elsewhere classified: Secondary | ICD-10-CM | POA: Diagnosis not present

## 2024-03-08 DIAGNOSIS — M533 Sacrococcygeal disorders, not elsewhere classified: Secondary | ICD-10-CM | POA: Diagnosis not present

## 2024-03-08 DIAGNOSIS — Z79899 Other long term (current) drug therapy: Secondary | ICD-10-CM | POA: Diagnosis not present

## 2024-03-08 DIAGNOSIS — G894 Chronic pain syndrome: Secondary | ICD-10-CM

## 2024-03-08 DIAGNOSIS — M79604 Pain in right leg: Secondary | ICD-10-CM

## 2024-03-08 DIAGNOSIS — M79605 Pain in left leg: Secondary | ICD-10-CM | POA: Diagnosis not present

## 2024-03-08 DIAGNOSIS — M47816 Spondylosis without myelopathy or radiculopathy, lumbar region: Secondary | ICD-10-CM | POA: Diagnosis not present

## 2024-03-08 DIAGNOSIS — G8929 Other chronic pain: Secondary | ICD-10-CM

## 2024-03-08 MED ORDER — OXYCODONE-ACETAMINOPHEN 10-325 MG PO TABS: 1.0000 | ORAL_TABLET | Freq: Three times a day (TID) | ORAL | 0 refills | Status: AC | PRN

## 2024-03-08 NOTE — Telephone Encounter (Signed)
 Patient states she has the flu. Do you want her to come in or want to do VV for 1 month med refill

## 2024-04-06 ENCOUNTER — Encounter: Admitting: Nurse Practitioner

## 2024-04-07 ENCOUNTER — Encounter: Admitting: Nurse Practitioner
